# Patient Record
Sex: Female | Born: 1980 | Race: White | Hispanic: No | Marital: Married | Smoking: Former smoker
Health system: Southern US, Community
[De-identification: ages and names within clinical notes are randomized; demographics above are authoritative.]

## PROBLEM LIST (undated history)

## (undated) DIAGNOSIS — Z9289 Personal history of other medical treatment: Secondary | ICD-10-CM

## (undated) DIAGNOSIS — R87613 High grade squamous intraepithelial lesion on cytologic smear of cervix (HGSIL): Secondary | ICD-10-CM

## (undated) DIAGNOSIS — E119 Type 2 diabetes mellitus without complications: Secondary | ICD-10-CM

## (undated) DIAGNOSIS — Z789 Other specified health status: Secondary | ICD-10-CM

## (undated) DIAGNOSIS — F419 Anxiety disorder, unspecified: Secondary | ICD-10-CM

## (undated) DIAGNOSIS — I10 Essential (primary) hypertension: Secondary | ICD-10-CM

## (undated) DIAGNOSIS — Z720 Tobacco use: Secondary | ICD-10-CM

## (undated) DIAGNOSIS — I2510 Atherosclerotic heart disease of native coronary artery without angina pectoris: Secondary | ICD-10-CM

## (undated) DIAGNOSIS — Z6841 Body Mass Index (BMI) 40.0 and over, adult: Secondary | ICD-10-CM

## (undated) DIAGNOSIS — E7800 Pure hypercholesterolemia, unspecified: Secondary | ICD-10-CM

## (undated) HISTORY — DX: Pure hypercholesterolemia, unspecified: E78.00

## (undated) HISTORY — PX: ADENOIDECTOMY: SUR15

## (undated) HISTORY — DX: Personal history of other medical treatment: Z92.89

## (undated) HISTORY — PX: TYMPANOSTOMY TUBE PLACEMENT: SHX32

## (undated) HISTORY — PX: TONSILLECTOMY AND ADENOIDECTOMY: SUR1326

## (undated) HISTORY — DX: Body Mass Index (BMI) 40.0 and over, adult: Z684

## (undated) HISTORY — DX: Essential (primary) hypertension: I10

## (undated) HISTORY — DX: Tobacco use: Z72.0

## (undated) HISTORY — PX: OTHER SURGICAL HISTORY: SHX169

## (undated) HISTORY — DX: Atherosclerotic heart disease of native coronary artery without angina pectoris: I25.10

## (undated) HISTORY — DX: High grade squamous intraepithelial lesion on cytologic smear of cervix (HGSIL): R87.613

## (undated) HISTORY — DX: Type 2 diabetes mellitus without complications: E11.9

---
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
## 2002-07-26 ENCOUNTER — Inpatient Hospital Stay (HOSPITAL_COMMUNITY): Admission: AD | Admit: 2002-07-26 | Discharge: 2002-07-26 | Payer: Self-pay | Admitting: *Deleted

## 2002-09-16 ENCOUNTER — Encounter: Payer: Self-pay | Admitting: Obstetrics and Gynecology

## 2002-09-16 ENCOUNTER — Ambulatory Visit (HOSPITAL_COMMUNITY): Admission: RE | Admit: 2002-09-16 | Discharge: 2002-09-16 | Payer: Self-pay | Admitting: Obstetrics and Gynecology

## 2002-09-21 ENCOUNTER — Other Ambulatory Visit: Admission: RE | Admit: 2002-09-21 | Discharge: 2002-09-21 | Payer: Self-pay | Admitting: Obstetrics and Gynecology

## 2002-10-02 ENCOUNTER — Encounter: Payer: Self-pay | Admitting: Obstetrics and Gynecology

## 2002-10-02 ENCOUNTER — Ambulatory Visit (HOSPITAL_COMMUNITY): Admission: RE | Admit: 2002-10-02 | Discharge: 2002-10-02 | Payer: Self-pay | Admitting: Obstetrics and Gynecology

## 2002-12-27 ENCOUNTER — Inpatient Hospital Stay (HOSPITAL_COMMUNITY): Admission: AD | Admit: 2002-12-27 | Discharge: 2002-12-27 | Payer: Self-pay | Admitting: Obstetrics and Gynecology

## 2002-12-29 ENCOUNTER — Other Ambulatory Visit: Admission: RE | Admit: 2002-12-29 | Discharge: 2002-12-29 | Payer: Self-pay | Admitting: Obstetrics and Gynecology

## 2003-01-16 ENCOUNTER — Inpatient Hospital Stay (HOSPITAL_COMMUNITY): Admission: AD | Admit: 2003-01-16 | Discharge: 2003-01-20 | Payer: Self-pay | Admitting: Obstetrics and Gynecology

## 2003-01-17 ENCOUNTER — Encounter: Payer: Self-pay | Admitting: Obstetrics and Gynecology

## 2003-05-03 ENCOUNTER — Other Ambulatory Visit: Admission: RE | Admit: 2003-05-03 | Discharge: 2003-05-03 | Payer: Self-pay | Admitting: Obstetrics and Gynecology

## 2004-01-20 ENCOUNTER — Ambulatory Visit (HOSPITAL_COMMUNITY): Admission: RE | Admit: 2004-01-20 | Discharge: 2004-01-20 | Payer: Self-pay | Admitting: Obstetrics and Gynecology

## 2004-04-26 ENCOUNTER — Emergency Department (HOSPITAL_COMMUNITY): Admission: EM | Admit: 2004-04-26 | Discharge: 2004-04-26 | Payer: Self-pay | Admitting: Family Medicine

## 2004-07-20 ENCOUNTER — Inpatient Hospital Stay (HOSPITAL_COMMUNITY): Admission: AD | Admit: 2004-07-20 | Discharge: 2004-07-20 | Payer: Self-pay | Admitting: Obstetrics and Gynecology

## 2004-08-03 ENCOUNTER — Inpatient Hospital Stay (HOSPITAL_COMMUNITY): Admission: AD | Admit: 2004-08-03 | Discharge: 2004-08-05 | Payer: Self-pay | Admitting: Obstetrics and Gynecology

## 2004-08-31 ENCOUNTER — Other Ambulatory Visit: Admission: RE | Admit: 2004-08-31 | Discharge: 2004-08-31 | Payer: Self-pay | Admitting: Obstetrics and Gynecology

## 2005-05-05 ENCOUNTER — Emergency Department: Payer: Self-pay | Admitting: Unknown Physician Specialty

## 2008-10-31 ENCOUNTER — Emergency Department: Payer: Self-pay | Admitting: Emergency Medicine

## 2009-03-13 ENCOUNTER — Emergency Department: Payer: Self-pay | Admitting: Emergency Medicine

## 2009-08-18 DIAGNOSIS — R87613 High grade squamous intraepithelial lesion on cytologic smear of cervix (HGSIL): Secondary | ICD-10-CM

## 2009-08-18 HISTORY — DX: High grade squamous intraepithelial lesion on cytologic smear of cervix (HGSIL): R87.613

## 2009-09-04 ENCOUNTER — Emergency Department: Payer: Self-pay | Admitting: Emergency Medicine

## 2009-09-05 ENCOUNTER — Ambulatory Visit: Payer: Self-pay

## 2009-09-28 HISTORY — PX: COLPOSCOPY: SHX161

## 2010-10-22 NOTE — L&D Delivery Note (Signed)
Patient was C/C/+1 with OP presentation.  Manual rotation of head to OA succeeded  and pt pushed for 20 minutes more with epidural.    NSVD  female infant, Apgars 9,9, weight 6#13.   The patient had one 1 degree perineal lacerations repaired with 2-0 vicryl. Fundus was firm. EBL was expected. Placenta was delivered intact. Vagina was clear.  Baby was vigorous to bedside.   Mamta Rimmer A

## 2010-11-22 ENCOUNTER — Inpatient Hospital Stay (HOSPITAL_COMMUNITY): Payer: Medicaid Other

## 2010-11-22 ENCOUNTER — Inpatient Hospital Stay (HOSPITAL_COMMUNITY)
Admission: AD | Admit: 2010-11-22 | Discharge: 2010-11-23 | Disposition: A | Discharge status: Home / Self Care | Payer: Medicaid Other | Source: Ambulatory Visit | Attending: Obstetrics & Gynecology | Admitting: Obstetrics & Gynecology

## 2010-11-22 DIAGNOSIS — O2390 Unspecified genitourinary tract infection in pregnancy, unspecified trimester: Secondary | ICD-10-CM

## 2010-11-22 DIAGNOSIS — N760 Acute vaginitis: Secondary | ICD-10-CM

## 2010-11-22 DIAGNOSIS — R109 Unspecified abdominal pain: Secondary | ICD-10-CM | POA: Insufficient documentation

## 2010-11-22 DIAGNOSIS — A499 Bacterial infection, unspecified: Secondary | ICD-10-CM

## 2010-11-22 DIAGNOSIS — B9689 Other specified bacterial agents as the cause of diseases classified elsewhere: Secondary | ICD-10-CM | POA: Insufficient documentation

## 2010-11-22 LAB — PREGNANCY, URINE: Preg Test, Ur: POSITIVE

## 2010-11-22 LAB — URINALYSIS, ROUTINE W REFLEX MICROSCOPIC
Bilirubin Urine: NEGATIVE
Hgb urine dipstick: NEGATIVE
Ketones, ur: NEGATIVE mg/dL
Nitrite: NEGATIVE
Protein, ur: NEGATIVE mg/dL
Specific Gravity, Urine: 1.025 (ref 1.005–1.030)
Urine Glucose, Fasting: NEGATIVE mg/dL
Urobilinogen, UA: 0.2 mg/dL (ref 0.0–1.0)
pH: 6 (ref 5.0–8.0)

## 2010-11-23 LAB — WET PREP, GENITAL
Trich, Wet Prep: NONE SEEN
Yeast Wet Prep HPF POC: NONE SEEN

## 2010-11-23 LAB — GC/CHLAMYDIA PROBE AMP, GENITAL: Chlamydia, DNA Probe: NEGATIVE

## 2010-12-11 LAB — ABO/RH: RH Type: POSITIVE

## 2010-12-11 LAB — HEPATITIS B SURFACE ANTIGEN: Hepatitis B Surface Ag: NEGATIVE

## 2010-12-11 LAB — HIV ANTIBODY (ROUTINE TESTING W REFLEX): HIV: NONREACTIVE

## 2010-12-11 LAB — RUBELLA ANTIBODY, IGM: Rubella: IMMUNE

## 2010-12-11 LAB — ANTIBODY SCREEN: Antibody Screen: NEGATIVE

## 2011-01-08 ENCOUNTER — Other Ambulatory Visit (HOSPITAL_COMMUNITY): Payer: Self-pay | Admitting: Obstetrics & Gynecology

## 2011-01-08 DIAGNOSIS — Z3689 Encounter for other specified antenatal screening: Secondary | ICD-10-CM

## 2011-02-05 ENCOUNTER — Ambulatory Visit (HOSPITAL_COMMUNITY)
Admission: RE | Admit: 2011-02-05 | Discharge: 2011-02-05 | Disposition: A | Discharge status: Home / Self Care | Payer: Medicaid Other | Source: Ambulatory Visit | Attending: Obstetrics & Gynecology | Admitting: Obstetrics & Gynecology

## 2011-02-05 DIAGNOSIS — Z3689 Encounter for other specified antenatal screening: Secondary | ICD-10-CM

## 2011-04-18 ENCOUNTER — Inpatient Hospital Stay (HOSPITAL_COMMUNITY)
Admission: AD | Admit: 2011-04-18 | Discharge: 2011-04-18 | Disposition: A | Discharge status: Home / Self Care | Payer: Medicaid Other | Source: Ambulatory Visit | Attending: Obstetrics & Gynecology | Admitting: Obstetrics & Gynecology

## 2011-04-18 DIAGNOSIS — O4700 False labor before 37 completed weeks of gestation, unspecified trimester: Secondary | ICD-10-CM | POA: Insufficient documentation

## 2011-04-18 LAB — URINALYSIS, ROUTINE W REFLEX MICROSCOPIC
Bilirubin Urine: NEGATIVE
Hgb urine dipstick: NEGATIVE
Nitrite: NEGATIVE
Specific Gravity, Urine: 1.01 (ref 1.005–1.030)
pH: 6.5 (ref 5.0–8.0)

## 2011-04-18 LAB — URINE MICROSCOPIC-ADD ON

## 2011-04-22 DIAGNOSIS — Z9289 Personal history of other medical treatment: Secondary | ICD-10-CM

## 2011-04-22 HISTORY — DX: Personal history of other medical treatment: Z92.89

## 2011-05-22 ENCOUNTER — Encounter (HOSPITAL_COMMUNITY): Payer: Self-pay

## 2011-05-22 ENCOUNTER — Inpatient Hospital Stay (HOSPITAL_COMMUNITY)
Admission: AD | Admit: 2011-05-22 | Discharge: 2011-05-22 | Disposition: A | Discharge status: Home / Self Care | Payer: Medicaid Other | Source: Ambulatory Visit | Attending: Obstetrics and Gynecology | Admitting: Obstetrics and Gynecology

## 2011-05-22 DIAGNOSIS — O479 False labor, unspecified: Secondary | ICD-10-CM

## 2011-05-22 DIAGNOSIS — O4700 False labor before 37 completed weeks of gestation, unspecified trimester: Secondary | ICD-10-CM

## 2011-05-22 LAB — URINALYSIS, ROUTINE W REFLEX MICROSCOPIC
Bilirubin Urine: NEGATIVE
Glucose, UA: NEGATIVE mg/dL
Ketones, ur: NEGATIVE mg/dL
Protein, ur: NEGATIVE mg/dL
Urobilinogen, UA: 0.2 mg/dL (ref 0.0–1.0)

## 2011-05-22 LAB — URINE MICROSCOPIC-ADD ON

## 2011-05-22 MED ORDER — NIFEDIPINE 10 MG PO CAPS
10.0000 mg | ORAL_CAPSULE | ORAL | Status: DC | PRN
  Administered 2011-05-22: 10 mg via ORAL

## 2011-05-22 NOTE — ED Provider Notes (Signed)
Holly Garner is a 30 y.o. female at 34.[redacted] weeks gestation presenting for pelvic pressure and mild contractions avery 15-22 minutes since this afternoon. She also reports decreased FM today that has increased while in MAU and denies VB, LOF, vaginal discharge or UTI Sx. She has a Hx of a 34 week delivery.. She is not currently on tocolytics or 17P.  History OB History    Grav Para Term Preterm Abortions TAB SAB Ect Mult Living   3 2 1 1  0 0 0 0 0 2     No past medical history on file. Past Surgical History  Procedure Date  . Adnoidectomy   . Tonsillectomy and adenoidectomy    Family History: family history is not on file. Social History:  reports that she has been smoking.  She has never used smokeless tobacco. She reports that she does not drink alcohol or use illicit drugs.  ROS Otherwise neg   Blood pressure 141/87, pulse 101, temperature 98.9 F (37.2 C), temperature source Oral, resp. rate 16, height 5' 4.5" (1.638 m), weight 124.796 kg (275 lb 2 oz), last menstrual period 08/25/2010. Exam Physical Exam  Dilation: 1 Effacement (%): Thick Cervical Position: Posterior Exam by:: Holly Garner CNM  Prenatal labs: ABO, Rh:   Antibody:   Rubella:   RPR:    HBsAg:    HIV:    GBS:     Urinalysis with microscopic (Order [id number])      Component Value Range & Units Status    Color, Urine YELLOW YELLOW Final    Appearance CLEAR CLEAR Final    Specific Gravity, Urine 1.010 1.005 - 1.030 Final    pH 7.0 5.0 - 8.0 Final    Glucose, UA NEGATIVE NEGATIVE mg/dL Final    Hgb urine dipstick TRACE (A) NEGATIVE Final    Bilirubin Urine NEGATIVE NEGATIVE Final    Ketones, ur NEGATIVE NEGATIVE mg/dL Final    Protein, ur NEGATIVE NEGATIVE mg/dL Final    Urobilinogen, UA 0.2 0.0 - 1.0 mg/dL Final    Nitrite NEGATIVE NEGATIVE Final    Leukocytes, UA SMALL (A) NEGATIVE Final     Assessment/Plan: Assessment: 1. 34.2 week IUP 2. Preterm UC's w/ no cervical change 3. Hx PTD 4. FHR  category 1  Plan: 1. Push PO fluids 2. PO Procardia per consult w/ Dr. Henderson Garner. D/C home w/ PTL precautions if good response.  Holly Garner 05/22/2011, 5:27 PM

## 2011-05-22 NOTE — Progress Notes (Signed)
Pt states ctx's began at 1015 this am, has had decreased fm today, denies vaginal bleeding. Had vaginal d/c-light brown, non-odorous this weekend, none at present. Ctx's q15-65minutes apart.

## 2011-06-13 ENCOUNTER — Encounter (HOSPITAL_COMMUNITY): Payer: Self-pay | Admitting: *Deleted

## 2011-06-13 ENCOUNTER — Inpatient Hospital Stay (HOSPITAL_COMMUNITY)
Admission: AD | Admit: 2011-06-13 | Discharge: 2011-06-13 | Disposition: A | Discharge status: Home / Self Care | Payer: Medicaid Other | Source: Ambulatory Visit | Attending: Obstetrics and Gynecology | Admitting: Obstetrics and Gynecology

## 2011-06-13 ENCOUNTER — Inpatient Hospital Stay (HOSPITAL_COMMUNITY)
Admission: AD | Admit: 2011-06-13 | Discharge: 2011-06-15 | DRG: 775 | Disposition: A | Discharge status: Home / Self Care | Payer: Medicaid Other | Source: Ambulatory Visit | Attending: Obstetrics & Gynecology | Admitting: Obstetrics & Gynecology

## 2011-06-13 DIAGNOSIS — O4700 False labor before 37 completed weeks of gestation, unspecified trimester: Secondary | ICD-10-CM | POA: Insufficient documentation

## 2011-06-13 DIAGNOSIS — O479 False labor, unspecified: Secondary | ICD-10-CM

## 2011-06-13 DIAGNOSIS — O99892 Other specified diseases and conditions complicating childbirth: Secondary | ICD-10-CM | POA: Diagnosis present

## 2011-06-13 DIAGNOSIS — Z22330 Carrier of Group B streptococcus: Secondary | ICD-10-CM

## 2011-06-13 DIAGNOSIS — Z331 Pregnant state, incidental: Secondary | ICD-10-CM

## 2011-06-13 HISTORY — DX: Other specified health status: Z78.9

## 2011-06-13 LAB — URINE MICROSCOPIC-ADD ON

## 2011-06-13 LAB — CBC
HCT: 34.5 % — ABNORMAL LOW (ref 36.0–46.0)
HCT: 36.6 % (ref 36.0–46.0)
Hemoglobin: 11.6 g/dL — ABNORMAL LOW (ref 12.0–15.0)
Hemoglobin: 12 g/dL (ref 12.0–15.0)
MCH: 28.2 pg (ref 26.0–34.0)
MCH: 27.1 pg (ref 26.0–34.0)
MCV: 83.7 fL (ref 78.0–100.0)
RBC: 4.12 MIL/uL (ref 3.87–5.11)
RBC: 4.42 MIL/uL (ref 3.87–5.11)
WBC: 15 10*3/uL — ABNORMAL HIGH (ref 4.0–10.5)

## 2011-06-13 LAB — URINALYSIS, ROUTINE W REFLEX MICROSCOPIC
Bilirubin Urine: NEGATIVE
Ketones, ur: NEGATIVE mg/dL
Nitrite: NEGATIVE
Protein, ur: NEGATIVE mg/dL
pH: 7 (ref 5.0–8.0)

## 2011-06-13 LAB — URIC ACID: Uric Acid, Serum: 5.7 mg/dL (ref 2.4–7.0)

## 2011-06-13 LAB — COMPREHENSIVE METABOLIC PANEL
AST: 12 U/L (ref 0–37)
BUN: 7 mg/dL (ref 6–23)
CO2: 23 mEq/L (ref 19–32)
Calcium: 9.7 mg/dL (ref 8.4–10.5)
Chloride: 100 mEq/L (ref 96–112)
Creatinine, Ser: 0.6 mg/dL (ref 0.50–1.10)
GFR calc Af Amer: >60 mL/min (ref >60)
GFR calc non Af Amer: >60 mL/min (ref >60)
Glucose, Bld: 97 mg/dL (ref 70–99)
Total Bilirubin: 0.1 mg/dL — ABNORMAL LOW (ref 0.3–1.2)

## 2011-06-13 MED ORDER — IBUPROFEN 600 MG PO TABS: 600.0000 mg | ORAL_TABLET | Freq: Four times a day (QID) | ORAL | Status: DC | PRN

## 2011-06-13 MED ORDER — OXYTOCIN BOLUS FROM INFUSION
500.0000 mL | Freq: Once | INTRAVENOUS | Status: DC
  Filled 2011-06-13: qty 1000
  Filled 2011-06-13: qty 500

## 2011-06-13 MED ORDER — CEFAZOLIN SODIUM-DEXTROSE 2-3 GM-% IV SOLR
2.0000 g | Freq: Once | INTRAVENOUS | Status: DC
  Filled 2011-06-13: qty 50

## 2011-06-13 MED ORDER — FLEET ENEMA 7-19 GM/118ML RE ENEM: 1.0000 | ENEMA | RECTAL | Status: DC | PRN

## 2011-06-13 MED ORDER — ONDANSETRON HCL 4 MG/2ML IJ SOLN: 4.0000 mg | Freq: Four times a day (QID) | INTRAMUSCULAR | Status: DC | PRN

## 2011-06-13 MED ORDER — EPHEDRINE 5 MG/ML INJ
10.0000 mg | INTRAVENOUS | Status: DC | PRN
  Filled 2011-06-13: qty 4

## 2011-06-13 MED ORDER — PHENYLEPHRINE 40 MCG/ML (10ML) SYRINGE FOR IV PUSH (FOR BLOOD PRESSURE SUPPORT)
80.0000 ug | PREFILLED_SYRINGE | INTRAVENOUS | Status: DC | PRN
  Filled 2011-06-13: qty 5

## 2011-06-13 MED ORDER — OXYTOCIN 20 UNITS IN LACTATED RINGERS INFUSION - SIMPLE: 125.0000 mL/h | INTRAVENOUS | Status: DC

## 2011-06-13 MED ORDER — LACTATED RINGERS IV SOLN: 500.0000 mL | INTRAVENOUS | Status: DC | PRN

## 2011-06-13 MED ORDER — LACTATED RINGERS IV SOLN
500.0000 mL | Freq: Once | INTRAVENOUS | Status: AC
  Administered 2011-06-14: 1000 mL via INTRAVENOUS

## 2011-06-13 MED ORDER — ACETAMINOPHEN 325 MG PO TABS
650.0000 mg | ORAL_TABLET | ORAL | Status: DC | PRN
  Administered 2011-06-13 – 2011-06-14 (×3): 650 mg via ORAL
  Filled 2011-06-13 (×3): qty 2

## 2011-06-13 MED ORDER — PENICILLIN G POTASSIUM 5000000 UNITS IJ SOLR
2.5000 10*6.[IU] | INTRAVENOUS | Status: DC
  Administered 2011-06-14 (×3): 2.5 10*6.[IU] via INTRAVENOUS
  Filled 2011-06-13 (×8): qty 2.5

## 2011-06-13 MED ORDER — LIDOCAINE HCL (PF) 1 % IJ SOLN
30.0000 mL | INTRAMUSCULAR | Status: DC | PRN
  Filled 2011-06-13 (×2): qty 30

## 2011-06-13 MED ORDER — DIPHENHYDRAMINE HCL 50 MG/ML IJ SOLN: 12.5000 mg | INTRAMUSCULAR | Status: DC | PRN

## 2011-06-13 MED ORDER — BUTORPHANOL TARTRATE 2 MG/ML IJ SOLN
1.0000 mg | Freq: Once | INTRAMUSCULAR | Status: AC
  Administered 2011-06-13: 1 mg via INTRAMUSCULAR
  Filled 2011-06-13: qty 1

## 2011-06-13 MED ORDER — LACTATED RINGERS IV SOLN
INTRAVENOUS | Status: DC
  Administered 2011-06-13: 20:00:00 via INTRAVENOUS
  Administered 2011-06-14: 1000 mL via INTRAVENOUS

## 2011-06-13 MED ORDER — CITRIC ACID-SODIUM CITRATE 334-500 MG/5ML PO SOLN: 30.0000 mL | ORAL | Status: DC | PRN

## 2011-06-13 MED ORDER — OXYCODONE-ACETAMINOPHEN 5-325 MG PO TABS: 2.0000 | ORAL_TABLET | ORAL | Status: DC | PRN

## 2011-06-13 MED ORDER — EPHEDRINE 5 MG/ML INJ
10.0000 mg | INTRAVENOUS | Status: DC | PRN
  Filled 2011-06-13 (×2): qty 4

## 2011-06-13 MED ORDER — PHENYLEPHRINE 40 MCG/ML (10ML) SYRINGE FOR IV PUSH (FOR BLOOD PRESSURE SUPPORT)
80.0000 ug | PREFILLED_SYRINGE | INTRAVENOUS | Status: DC | PRN
  Filled 2011-06-13 (×2): qty 5

## 2011-06-13 MED ORDER — BUTORPHANOL TARTRATE 2 MG/ML IJ SOLN
1.0000 mg | INTRAMUSCULAR | Status: DC | PRN
  Administered 2011-06-14 (×3): 1 mg via INTRAVENOUS
  Filled 2011-06-13 (×3): qty 1

## 2011-06-13 MED ORDER — CEFAZOLIN SODIUM 1-5 GM-% IV SOLN: 1.0000 g | Freq: Three times a day (TID) | INTRAVENOUS | Status: DC

## 2011-06-13 MED ORDER — FENTANYL 2.5 MCG/ML BUPIVACAINE 1/10 % EPIDURAL INFUSION (WH - ANES)
14.0000 mL/h | INTRAMUSCULAR | Status: DC
  Filled 2011-06-13: qty 60

## 2011-06-13 MED ORDER — PENICILLIN G POTASSIUM 5000000 UNITS IJ SOLR
5.0000 10*6.[IU] | Freq: Once | INTRAVENOUS | Status: AC
  Administered 2011-06-13: 5 10*6.[IU] via INTRAVENOUS
  Filled 2011-06-13: qty 5

## 2011-06-13 NOTE — Progress Notes (Signed)
Pt states she was in MAU this am and was 2 cm. Was seen in the office and was 5 cm and unable to determine contractions on the monitor. Sent to MAU for evaluation.

## 2011-06-13 NOTE — Progress Notes (Signed)
Pt c/o ctx, labor evaluation

## 2011-06-13 NOTE — H&P (Signed)
  30 y.o. [id number]  Estimated Date of Delivery: 07/01/11 admitted at  37 1/2 WEEKS CONTRACTIONS DILATED 5 CM.  Patient was observed earlier today (2 am - 7 am) at 2 cm dilation and sent home.  Presented to the office today and found to be 5 cm.  Presents to MAU and after walking has had contractions of increasing frequency and intensity.  Cx now 5-6, 90% with bulging amnion and bloody show. Prenatal course complicated by: Obesity, smoking, previous preterm delivery at 34 weeks. Prenatal labs: Blood Type:O+.  Screening tests for HIV, Syphilis, Hepatitis B, Rubella sensitivity, and gestational diabetes  were all negative.  Patient is positive for perineal group B strep colonization.  She declined screening for fetal anomalies.  Afebrile, VSS Heart and Lungs: No active disease Abdomen: soft, gravid, EFW 7 lbs. Cervical exam:  5-6, Vtx. -2.     Impression: 37 1/2 WEEKS CONTRACTIONS DILATED 5 CM  Plan:  Admit to [facility]

## 2011-06-13 NOTE — Progress Notes (Signed)
Pt reports some bleeding tonight, uc's q 4 minutes. Denies problems with preg

## 2011-06-13 NOTE — ED Provider Notes (Signed)
History   Pt presents today c/o ctx and abd pain. She reports GFM and denies vag dc or bleeding.  No chief complaint on file.  HPI  OB History    Grav Para Term Preterm Abortions TAB SAB Ect Mult Living   3 2 1 1  0 0 0 0 0 2      No past medical history on file.  Past Surgical History  Procedure Date  . Adnoidectomy   . Tonsillectomy and adenoidectomy     No family history on file.  History  Substance Use Topics  . Smoking status: Current Everyday Smoker -- 1.0 packs/day  . Smokeless tobacco: Never Used  . Alcohol Use: No    Allergies: No Known Allergies  Prescriptions prior to admission  Medication Sig Dispense Refill  . acetaminophen (TYLENOL) 325 MG tablet Take by mouth every 6 (six) hours as needed. As needed for pain       . calcium carbonate (TUMS - DOSED IN MG ELEMENTAL CALCIUM) 500 MG chewable tablet Chew 1 tablet by mouth daily. As needed for heartburn       . prenatal vitamin w/FE, FA (PRENATAL 1 + 1) 27-1 MG TABS Take 1 tablet by mouth daily.          Review of Systems  Constitutional: Negative for fever.  Cardiovascular: Negative for chest pain.  Gastrointestinal: Positive for abdominal pain. Negative for nausea, vomiting and diarrhea.  Genitourinary: Negative for dysuria, urgency, frequency and hematuria.  Neurological: Negative for dizziness and headaches.  Psychiatric/Behavioral: Negative for depression and suicidal ideas.   Physical Exam   Blood pressure 140/76, pulse 97, resp. rate 20, last menstrual period 08/25/2010.  Physical Exam  Constitutional: She is oriented to person, place, and time. She appears well-developed and well-nourished. No distress.  HENT:  Head: Normocephalic and atraumatic.  Eyes: EOM are normal. Pupils are equal, round, and reactive to light.  GI: Soft. She exhibits no distension. There is no tenderness. There is no rebound and no guarding.  Genitourinary:       Cervix per nurse 1/thick/-2 and unchanged after an hour.    Neurological: She is alert and oriented to person, place, and time.  Skin: Skin is warm and dry. She is not diaphoretic.  Psychiatric: She has a normal mood and affect. Her behavior is normal. Judgment and thought content normal.    MAU Course  Procedures  Results for orders placed during the hospital encounter of 06/13/11 (from the past 24 hour(s))  URINALYSIS, ROUTINE W REFLEX MICROSCOPIC     Status: Abnormal   Collection Time   06/13/11  4:00 AM      Component Value Range   Color, Urine YELLOW  YELLOW    Appearance CLEAR  CLEAR    Specific Gravity, Urine 1.015  1.005 - 1.030    pH 7.0  5.0 - 8.0    Glucose, UA NEGATIVE  NEGATIVE (mg/dL)   Hgb urine dipstick SMALL (*) NEGATIVE    Bilirubin Urine NEGATIVE  NEGATIVE    Ketones, ur NEGATIVE  NEGATIVE (mg/dL)   Protein, ur NEGATIVE  NEGATIVE (mg/dL)   Urobilinogen, UA 0.2  0.0 - 1.0 (mg/dL)   Nitrite NEGATIVE  NEGATIVE    Leukocytes, UA SMALL (*) NEGATIVE   URINE MICROSCOPIC-ADD ON     Status: Abnormal   Collection Time   06/13/11  4:00 AM      Component Value Range   Squamous Epithelial / LPF FEW (*) RARE    WBC,  UA 3-6  <3 (WBC/hpf)   RBC / HPF 0-2  <3 (RBC/hpf)   Bacteria, UA FEW (*) RARE   CBC     Status: Abnormal   Collection Time   06/13/11  4:05 AM      Component Value Range   WBC 15.0 (*) 4.0 - 10.5 (K/uL)   RBC 4.12  3.87 - 5.11 (MIL/uL)   Hemoglobin 11.6 (*) 12.0 - 15.0 (g/dL)   HCT 40.9 (*) 81.1 - 46.0 (%)   MCV 83.7  78.0 - 100.0 (fL)   MCH 28.2  26.0 - 34.0 (pg)   MCHC 33.6  30.0 - 36.0 (g/dL)   RDW 91.4  78.2 - 95.6 (%)   Platelets 289  150 - 400 (K/uL)  COMPREHENSIVE METABOLIC PANEL     Status: Abnormal   Collection Time   06/13/11  4:05 AM      Component Value Range   Sodium 135  135 - 145 (mEq/L)   Potassium 3.9  3.5 - 5.1 (mEq/L)   Chloride 100  96 - 112 (mEq/L)   CO2 23  19 - 32 (mEq/L)   Glucose, Bld 97  70 - 99 (mg/dL)   BUN 7  6 - 23 (mg/dL)   Creatinine, Ser 2.13  0.50 - 1.10 (mg/dL)    Calcium 9.7  8.4 - 10.5 (mg/dL)   Total Protein 6.6  6.0 - 8.3 (g/dL)   Albumin 2.6 (*) 3.5 - 5.2 (g/dL)   AST 12  0 - 37 (U/L)   ALT 10  0 - 35 (U/L)   Alkaline Phosphatase 232 (*) 39 - 117 (U/L)   Total Bilirubin 0.1 (*) 0.3 - 1.2 (mg/dL)   GFR calc non Af Amer >60  >60 (mL/min)   GFR calc Af Amer >60  >60 (mL/min)  LACTATE DEHYDROGENASE     Status: Normal   Collection Time   06/13/11  4:05 AM      Component Value Range   LD 167  94 - 250 (U/L)  URIC ACID     Status: Normal   Collection Time   06/13/11  4:05 AM      Component Value Range   Uric Acid, Serum 5.7  2.4 - 7.0 (mg/dL)   Discussed pt with Dr. Tenny Craw. Pt to keep her scheduled appt later today.   Assessment and Plan  Braxton Hicks ctx: discussed with pt at length. Discussed signs and sx of preeclampsia. Reminded of [facility]. DC to home with precautions.  Clinton Gallant. Raychelle Hudman III, DrHSc, MPAS, PA-C  06/13/2011, 6:30 AM

## 2011-06-13 NOTE — Progress Notes (Signed)
Pt in for labor eval- reports ucs in back about every 10 minutes.  Denies any leaking of fluid, reports bleeding last night, none presently.  Decreased fetal movement past couple days.

## 2011-06-14 ENCOUNTER — Encounter (HOSPITAL_COMMUNITY): Payer: Self-pay | Admitting: Anesthesiology

## 2011-06-14 ENCOUNTER — Encounter (HOSPITAL_COMMUNITY): Payer: Self-pay | Admitting: *Deleted

## 2011-06-14 ENCOUNTER — Inpatient Hospital Stay (HOSPITAL_COMMUNITY): Payer: Medicaid Other | Admitting: Anesthesiology

## 2011-06-14 LAB — RPR: RPR Ser Ql: NONREACTIVE

## 2011-06-14 MED ORDER — IBUPROFEN 800 MG PO TABS
800.0000 mg | ORAL_TABLET | Freq: Three times a day (TID) | ORAL | Status: DC
  Administered 2011-06-15 (×2): 800 mg via ORAL
  Filled 2011-06-14 (×2): qty 1

## 2011-06-14 MED ORDER — DIPHENHYDRAMINE HCL 25 MG PO CAPS: 25.0000 mg | ORAL_CAPSULE | Freq: Four times a day (QID) | ORAL | Status: DC | PRN

## 2011-06-14 MED ORDER — BENZOCAINE-MENTHOL 20-0.5 % EX AERO
INHALATION_SPRAY | CUTANEOUS | Status: AC
  Filled 2011-06-14: qty 56

## 2011-06-14 MED ORDER — DIBUCAINE 1 % RE OINT: 1.0000 "application " | TOPICAL_OINTMENT | RECTAL | Status: DC | PRN

## 2011-06-14 MED ORDER — ONDANSETRON HCL 4 MG/2ML IJ SOLN: 4.0000 mg | INTRAMUSCULAR | Status: DC | PRN

## 2011-06-14 MED ORDER — TERBUTALINE SULFATE 1 MG/ML IJ SOLN: 0.2500 mg | Freq: Once | INTRAMUSCULAR | Status: DC | PRN

## 2011-06-14 MED ORDER — BUPIVACAINE HCL (PF) 0.25 % IJ SOLN
INTRAMUSCULAR | Status: DC | PRN
  Administered 2011-06-14: 10 mL

## 2011-06-14 MED ORDER — PRENATAL PLUS 27-1 MG PO TABS
1.0000 | ORAL_TABLET | Freq: Every day | ORAL | Status: DC
  Administered 2011-06-14 – 2011-06-15 (×2): 1 via ORAL
  Filled 2011-06-14: qty 1

## 2011-06-14 MED ORDER — FERROUS SULFATE 325 (65 FE) MG PO TABS
325.0000 mg | ORAL_TABLET | Freq: Two times a day (BID) | ORAL | Status: DC
  Administered 2011-06-14 – 2011-06-15 (×2): 325 mg via ORAL
  Filled 2011-06-14 (×2): qty 1

## 2011-06-14 MED ORDER — IBUPROFEN 800 MG PO TABS
800.0000 mg | ORAL_TABLET | Freq: Three times a day (TID) | ORAL | Status: DC
  Administered 2011-06-14: 800 mg via ORAL
  Filled 2011-06-14 (×2): qty 1

## 2011-06-14 MED ORDER — WITCH HAZEL-GLYCERIN EX PADS: 1.0000 "application " | MEDICATED_PAD | CUTANEOUS | Status: DC | PRN

## 2011-06-14 MED ORDER — BENZOCAINE-MENTHOL 20-0.5 % EX AERO: 1.0000 "application " | INHALATION_SPRAY | CUTANEOUS | Status: DC | PRN

## 2011-06-14 MED ORDER — OXYTOCIN 20 UNITS IN LACTATED RINGERS INFUSION - SIMPLE
1.0000 m[IU]/min | INTRAVENOUS | Status: DC
  Administered 2011-06-14: 333 m[IU]/min via INTRAVENOUS
  Administered 2011-06-14: 2 m[IU]/min via INTRAVENOUS

## 2011-06-14 MED ORDER — SENNOSIDES-DOCUSATE SODIUM 8.6-50 MG PO TABS
2.0000 | ORAL_TABLET | Freq: Every day | ORAL | Status: DC
  Administered 2011-06-14: 2 via ORAL

## 2011-06-14 MED ORDER — LIDOCAINE HCL 1.5 % IJ SOLN
INTRAMUSCULAR | Status: DC | PRN
  Administered 2011-06-14 (×2): 5 mL via EPIDURAL

## 2011-06-14 MED ORDER — TETANUS-DIPHTH-ACELL PERTUSSIS 5-2.5-18.5 LF-MCG/0.5 IM SUSP: 0.5000 mL | Freq: Once | INTRAMUSCULAR | Status: DC

## 2011-06-14 MED ORDER — SIMETHICONE 80 MG PO CHEW: 80.0000 mg | CHEWABLE_TABLET | ORAL | Status: DC | PRN

## 2011-06-14 MED ORDER — LANOLIN HYDROUS EX OINT: TOPICAL_OINTMENT | CUTANEOUS | Status: DC | PRN

## 2011-06-14 MED ORDER — MEASLES, MUMPS & RUBELLA VAC ~~LOC~~ INJ: 0.5000 mL | INJECTION | Freq: Once | SUBCUTANEOUS | Status: DC

## 2011-06-14 MED ORDER — ZOLPIDEM TARTRATE 5 MG PO TABS: 5.0000 mg | ORAL_TABLET | Freq: Every evening | ORAL | Status: DC | PRN

## 2011-06-14 MED ORDER — ONDANSETRON HCL 4 MG PO TABS: 4.0000 mg | ORAL_TABLET | ORAL | Status: DC | PRN

## 2011-06-14 MED ORDER — OXYCODONE-ACETAMINOPHEN 5-325 MG PO TABS
1.0000 | ORAL_TABLET | ORAL | Status: DC | PRN
  Administered 2011-06-14: 2 via ORAL
  Administered 2011-06-15 (×3): 1 via ORAL
  Filled 2011-06-14 (×5): qty 1

## 2011-06-14 MED ORDER — MAGNESIUM HYDROXIDE 400 MG/5ML PO SUSP: 30.0000 mL | ORAL | Status: DC | PRN

## 2011-06-14 NOTE — Progress Notes (Addendum)
Last BP 130/82.  Some elevated BPs but less than 150/95. PIH labs normal.  FHTs 140s, NST R. Toco q10 SVE 6/C/-2, AROM clear.  Ok for epidural when wants and augment if no change in 1 hour.  Jessiah Wojnar A

## 2011-06-14 NOTE — Progress Notes (Signed)
Patient was C/C/+1 with OP presentation.  Manual rotation of head to OA succeeded  and pt pushed for 20 minutes more with epidural.    NSVD  female infant, Apgars 9,9, weight 6#13.   The patient had one 1 degree perineal lacerations repaired with 2-0 vicryl. Fundus was firm. EBL was expected. Placenta was delivered intact. Vagina was clear.  Baby was vigorous to bedside.   Maui Britten A  

## 2011-06-14 NOTE — Anesthesia Procedure Notes (Signed)
Epidural Patient location during procedure: OB Start time: 06/14/2011 10:38 AM  Staffing Performed by: anesthesiologist   Preanesthetic Checklist Completed: patient identified, site marked, surgical consent, pre-op evaluation, timeout performed, IV checked, risks and benefits discussed and monitors and equipment checked  Epidural Patient position: sitting Prep: site prepped and draped and DuraPrep Patient monitoring: continuous pulse ox and blood pressure Approach: midline Injection technique: LOR air and LOR saline  Needle:  Needle type: Tuohy  Needle gauge: 17 G Needle length: 9 cm Needle insertion depth: 8 cm Catheter type: closed end flexible Catheter size: 19 Gauge Catheter at skin depth: 13 cm Test dose: negative  Assessment Events: blood not aspirated, injection not painful, no injection resistance, negative IV test and no paresthesia  Additional Notes Patient identified.  Risk benefits discussed including failed block, incomplete pain control, headache, nerve damage, paralysis, blood pressure changes, nausea, vomiting, reactions to medication both toxic or allergic, and postpartum back pain.  Patient expressed understanding and wished to proceed.  All questions were answered.  Sterile technique used throughout procedure and epidural site dressed with sterile barrier dressing. No paresthesia or other complications noted.The patient did not experience any signs of intravascular injection such as tinnitus or metallic taste in mouth nor signs of intrathecal spread such as rapid motor block. Please see nursing notes for vital signs.

## 2011-06-14 NOTE — Anesthesia Preprocedure Evaluation (Signed)
Anesthesia Evaluation  Name, MR# and DOB Patient awake  General Assessment Comment  Reviewed: Allergy & Precautions, H&P , Patient's Chart, lab work & pertinent test results  Airway Mallampati: IV TM Distance: >3 FB Neck ROM: full    Dental No notable dental hx.    Pulmonary  clear to auscultation  pulmonary exam normalPulmonary Exam Normal breath sounds clear to auscultation none    Cardiovascular regular Normal    Neuro/Psych Negative Neurological ROS  Negative Psych ROS  GI/Hepatic/Renal negative GI ROS  negative Liver ROS  negative Renal ROS        Endo/Other  Negative Endocrine ROS (+)   Morbid obesity  Abdominal   Musculoskeletal   Hematology negative hematology ROS (+)   Peds  Reproductive/Obstetrics (+) Pregnancy    Anesthesia Other Findings             Anesthesia Physical Anesthesia Plan  ASA: III  Anesthesia Plan: Epidural   Post-op Pain Management:    Induction:   Airway Management Planned:   Additional Equipment:   Intra-op Plan:   Post-operative Plan:   Informed Consent: I have reviewed the patients History and Physical, chart, labs and discussed the procedure including the risks, benefits and alternatives for the proposed anesthesia with the patient or authorized representative who has indicated his/her understanding and acceptance.     Plan Discussed with:   Anesthesia Plan Comments:         Anesthesia Quick Evaluation

## 2011-06-15 LAB — CBC
Hemoglobin: 10 g/dL — ABNORMAL LOW (ref 12.0–15.0)
MCH: 27 pg (ref 26.0–34.0)
MCV: 84.3 fL (ref 78.0–100.0)
Platelets: 271 10*3/uL (ref 150–400)
RBC: 3.7 MIL/uL — ABNORMAL LOW (ref 3.87–5.11)
WBC: 14.1 10*3/uL — ABNORMAL HIGH (ref 4.0–10.5)

## 2011-06-15 NOTE — Anesthesia Postprocedure Evaluation (Signed)
Anesthesia Post Note  Patient: Holly Garner  Procedure(s) Performed: * No procedures listed *  Anesthesia type: Epidural  Patient location: Mother/Baby  Post pain: Pain level controlled  Post assessment: Post-op Vital signs reviewed  Last Vitals:  Filed Vitals:   06/15/11 1411  BP: 119/77  Pulse: 87  Temp: 97.7 F (36.5 C)  Resp: 18    Post vital signs: Reviewed  Level of consciousness: awake  Complications: No apparent anesthesia complications

## 2011-06-15 NOTE — Discharge Summary (Signed)
Holly Garner is doing well today and is desirous of discharge. Hopefully, later today the baby will be able to be discharged facilitating Holly Garner's discharge.  Discharge diagnoses  #1 37-38 week intrauterine pregnancy delivered 6 lbs. 13 oz. Female infant Apgars 9 and 9  #2 blood type O positive  #3 OP presentation  #4 positive intrapartum group B strep  Procedures  Normal spontaneous delivery and repair of first degree tear  As patient, a gravida 3 now para 3 was admitted on the evening of 8/22 in labor. She had a protracted first stage of labor probably related to being occiput posterior. She did have a normal spontaneous delivery on /23-delivered by Dr. Henderson Cloud. She is bottlefeeding without difficulty and go she is a baby are doing well. Her hemoglobin on the morning of 824 was 10.0 with a white count of 14,100 and a platelet count of 271,000. She did receive intrapartum penicillin because of her positive group B strep status. On the morning of 824 and she was desirous of discharge assuming that the baby could be discharged. Accordingly she was given all appropriate instructions per office discharge brochure and understood all instructions well. Discharge medications include vitamins one a day which she will finish, and she will take iron pills 3-4 times a week. She was given prescriptions for Percocet 5/325 to use 1-2 every 4-6 hours as needed for severe cramping and Motrin 600 mg to use every 6 hours as needed for mild cramping or pain. Follow up in the office will be carried out in approximately 4 weeks' time or as needed. Condition on discharge improved.

## 2011-06-15 NOTE — Progress Notes (Signed)
UR Chart review completed.  

## 2011-07-07 ENCOUNTER — Emergency Department: Payer: Self-pay | Admitting: Emergency Medicine

## 2012-07-03 ENCOUNTER — Emergency Department: Payer: Self-pay | Admitting: Emergency Medicine

## 2012-07-07 LAB — WOUND CULTURE

## 2012-09-25 ENCOUNTER — Ambulatory Visit: Payer: Self-pay | Admitting: Obstetrics & Gynecology

## 2012-10-06 ENCOUNTER — Ambulatory Visit: Payer: Self-pay | Admitting: Obstetrics & Gynecology

## 2012-10-06 LAB — CBC
HGB: 13.2 g/dL (ref 12.0–16.0)
MCHC: 31.4 g/dL — ABNORMAL LOW (ref 32.0–36.0)
RDW: 15.1 % — ABNORMAL HIGH (ref 11.5–14.5)

## 2012-10-06 LAB — PREGNANCY, URINE: Pregnancy Test, Urine: NEGATIVE m[IU]/mL

## 2012-10-07 ENCOUNTER — Ambulatory Visit: Payer: Self-pay

## 2012-12-10 ENCOUNTER — Emergency Department: Payer: Self-pay | Admitting: Emergency Medicine

## 2014-03-26 ENCOUNTER — Ambulatory Visit: Payer: Self-pay | Admitting: Unknown Physician Specialty

## 2014-08-23 ENCOUNTER — Encounter (HOSPITAL_COMMUNITY): Payer: Self-pay | Admitting: *Deleted

## 2014-09-13 ENCOUNTER — Emergency Department: Payer: Self-pay | Admitting: Emergency Medicine

## 2015-02-14 LAB — SURGICAL PATHOLOGY

## 2015-02-21 ENCOUNTER — Emergency Department
Admission: EM | Admit: 2015-02-21 | Discharge: 2015-02-21 | Disposition: A | Discharge status: Home / Self Care | Payer: Medicaid Other | Attending: Emergency Medicine | Admitting: Emergency Medicine

## 2015-02-21 DIAGNOSIS — L03114 Cellulitis of left upper limb: Secondary | ICD-10-CM | POA: Diagnosis not present

## 2015-02-21 DIAGNOSIS — R21 Rash and other nonspecific skin eruption: Secondary | ICD-10-CM | POA: Diagnosis present

## 2015-02-21 DIAGNOSIS — Y9289 Other specified places as the place of occurrence of the external cause: Secondary | ICD-10-CM | POA: Diagnosis not present

## 2015-02-21 DIAGNOSIS — Z79899 Other long term (current) drug therapy: Secondary | ICD-10-CM | POA: Diagnosis not present

## 2015-02-21 DIAGNOSIS — T7840XA Allergy, unspecified, initial encounter: Secondary | ICD-10-CM | POA: Insufficient documentation

## 2015-02-21 DIAGNOSIS — X58XXXA Exposure to other specified factors, initial encounter: Secondary | ICD-10-CM | POA: Insufficient documentation

## 2015-02-21 DIAGNOSIS — Z720 Tobacco use: Secondary | ICD-10-CM | POA: Diagnosis not present

## 2015-02-21 DIAGNOSIS — Y998 Other external cause status: Secondary | ICD-10-CM | POA: Insufficient documentation

## 2015-02-21 DIAGNOSIS — Y9389 Activity, other specified: Secondary | ICD-10-CM | POA: Insufficient documentation

## 2015-02-21 MED ORDER — RANITIDINE HCL 150 MG/10ML PO SYRP
150.0000 mg | ORAL_SOLUTION | Freq: Once | ORAL | Status: DC
  Filled 2015-02-21: qty 10

## 2015-02-21 MED ORDER — KETOROLAC TROMETHAMINE 10 MG PO TABS
ORAL_TABLET | ORAL | Status: DC
Start: 2015-02-21 — End: 2015-02-22
  Filled 2015-02-21: qty 1

## 2015-02-21 MED ORDER — PREDNISONE 10 MG (21) PO TBPK: ORAL_TABLET | ORAL | Status: DC

## 2015-02-21 MED ORDER — CEPHALEXIN 500 MG PO CAPS: 500.0000 mg | ORAL_CAPSULE | Freq: Four times a day (QID) | ORAL | Status: AC

## 2015-02-21 MED ORDER — RANITIDINE HCL 150 MG PO TABS
150.0000 mg | ORAL_TABLET | Freq: Every day | ORAL | Status: DC
Start: 2015-02-21 — End: 2018-01-28

## 2015-02-21 MED ORDER — DEXAMETHASONE SODIUM PHOSPHATE 10 MG/ML IJ SOLN
INTRAMUSCULAR | Status: AC
  Filled 2015-02-21: qty 1

## 2015-02-21 MED ORDER — CEPHALEXIN 500 MG PO CAPS
ORAL_CAPSULE | ORAL | Status: AC
  Filled 2015-02-21: qty 2

## 2015-02-21 MED ORDER — RANITIDINE HCL 150 MG PO TABS
ORAL_TABLET | ORAL | Status: AC
  Administered 2015-02-21: 150 mg
  Filled 2015-02-21: qty 1

## 2015-02-21 MED ORDER — KETOROLAC TROMETHAMINE 10 MG PO TABS
10.0000 mg | ORAL_TABLET | Freq: Once | ORAL | Status: AC
  Administered 2015-02-21: 10 mg via ORAL

## 2015-02-21 MED ORDER — DEXAMETHASONE SODIUM PHOSPHATE 10 MG/ML IJ SOLN
10.0000 mg | Freq: Once | INTRAMUSCULAR | Status: AC
  Administered 2015-02-21: 10 mg via INTRAMUSCULAR

## 2015-02-21 MED ORDER — CEPHALEXIN 500 MG PO CAPS
1000.0000 mg | ORAL_CAPSULE | Freq: Once | ORAL | Status: AC
  Administered 2015-02-21: 1000 mg via ORAL

## 2015-02-21 MED ORDER — HYDROXYZINE HCL 25 MG PO TABS: 25.0000 mg | ORAL_TABLET | Freq: Three times a day (TID) | ORAL | Status: DC | PRN

## 2015-02-21 NOTE — ED Notes (Signed)
PA gave VORB to give zantac PO instead on suspension.

## 2015-02-21 NOTE — ED Notes (Signed)
Pt working in garden without gloves and came in contact with thorns.  Pt c/o left index finger pain and swelling and right thumb pain and swelling.  Pt pulses strong and equal bilaterally, no decrease in sensation to fingers.  Pt A&Ox4, speaking in complete and coherent sentences and in NAD at this time.

## 2015-02-21 NOTE — ED Notes (Signed)
Pt states she was pulling weeds and things earlier today. Pt states around 1300 today she noticed her left hand and fingers starting to swell and become red. Pt states she took 50mg  of benadryl around 1900. Pt is unsure if she was bitten or some sort of poison

## 2015-02-21 NOTE — Discharge Instructions (Signed)

## 2015-02-21 NOTE — ED Provider Notes (Signed)
[facility] Emergency Department Provider Note   ____________________________________________  Time seen: 2245  I have reviewed the triage vital signs and the nursing notes.   HISTORY  Chief Complaint Rash and Hand Problem       HPI Holly Garner is a 34 y.o. female caught her hand on the phone today pulling weeds today now swelling of her left hand occurred approximately 6 hours ago progressively more swelling and worsening over the last hour prior to arrival is about 7 out of 10 swelling type pain movement making it worse home and it still makes it better no other associated signs or symptoms currently     Past Medical History  Diagnosis Date  . No pertinent past medical history     There are no active problems to display for this patient.   Past Surgical History  Procedure Laterality Date  . Adnoidectomy    . Tonsillectomy and adenoidectomy      Current Outpatient Rx  Name  Route  Sig  Dispense  Refill  . cephALEXin (KEFLEX) 500 MG capsule   Oral   Take 1 capsule (500 mg total) by mouth 4 (four) times daily.   40 capsule   0   . predniSONE (STERAPRED UNI-PAK 21 TAB) 10 MG (21) TBPK tablet      Take 6 tablets on day 1 Take 5 tablets on day 2 Take 4 tablets on day 3 Take 3 tablets on day 4 Take 2 tablets on day 5 Take 1 tablet on day 6   21 tablet   0   . prenatal vitamin w/FE, FA (PRENATAL 1 + 1) 27-1 MG TABS   Oral   Take 1 tablet by mouth daily.           . ranitidine (ZANTAC) 150 MG tablet   Oral   Take 1 tablet (150 mg total) by mouth at bedtime.   30 tablet   1     Allergies Review of patient's allergies indicates no known allergies.  No family history on file.  Social History History  Substance Use Topics  . Smoking status: Current Every Day Smoker -- 1.00 packs/day for 10 years    Types: Cigarettes  . Smokeless tobacco: Never Used  . Alcohol Use: No    Review of Systems  Constitutional:  Negative for fever. Eyes: Negative for visual changes. ENT: Negative for sore throat. Cardiovascular: Negative for chest pain. Respiratory: Negative for shortness of breath. Gastrointestinal: Negative for abdominal pain, vomiting and diarrhea. Genitourinary: Negative for dysuria. Musculoskeletal: Negative for back pain. Skin: Negative for rash. Neurological: Negative for headaches, focal weakness or numbness.   10-point ROS otherwise negative.  ____________________________________________   PHYSICAL EXAM:  VITAL SIGNS: ED Triage Vitals  Enc Vitals Group     BP 02/21/15 2042 157/100 mmHg     Pulse Rate 02/21/15 2042 99     Resp 02/21/15 2042 16     Temp 02/21/15 2042 98.8 F (37.1 C)     Temp Source 02/21/15 2042 Oral     SpO2 02/21/15 2042 99 %     Weight 02/21/15 2042 292 lb (132.45 kg)     Height 02/21/15 2042 5\' 5"  (1.651 m)     Head Cir --      Peak Flow --      Pain Score 02/21/15 2043 10     Pain Loc --      Pain Edu? --      Excl. in  GC? --      Constitutional: Alert and oriented. Well appearing and in no distress. Eyes: Conjunctivae are normal. PERRL. Normal extraocular movements. ENT   Head: Normocephalic and atraumatic.   Nose: No congestion/rhinnorhea.   Mouth/Throat: Mucous membranes are moist.   Neck: No stridor. Hematological/Lymphatic/Immunilogical: No cervical lymphadenopathy. Cardiovascular: Normal rate, regular rhythm. Normal and symmetric distal pulses are present in all extremities. No murmurs, rubs, or gallops. Respiratory: Normal respiratory effort without tachypnea nor retractions. Breath sounds are clear and equal bilaterally. No wheezes/rales/rhonchi. Musculoskeletal: Nontender with normal range of motion in all extremities. No joint effusions.  No lower extremity tenderness nor edema. Neurologic:  Normal speech and language. No gross focal neurologic deficits are appreciated. Speech is normal. No gait instability. Skin:   Swelling to the left hand and redness around the second digit Psychiatric: Mood and affect are normal. Speech and behavior are normal. Patient exhibits appropriate insight and judgment.  ____________________________________________       PROCEDURES  Procedure(s) performed: None  Critical Care performed: No  ____________________________________________   INITIAL IMPRESSION / ASSESSMENT AND PLAN / ED COURSE  Pertinent labs & imaging results that were available during my care of the patient were reviewed by me and considered in my medical decision making (see chart for details).  Initial impression allergic reaction versus cellulitis patient was given steroids and antihistamines and antibiotics in the department we'll place her on them for the next few days or return here in 2 days if not improving  ____________________________________________   FINAL CLINICAL IMPRESSION(S) / ED DIAGNOSES  Final diagnoses:  Cellulitis of left upper extremity  Allergic, initial encounter    Kayli Beal Rosalyn Gess, PA-C 02/21/15 2313  Phineas Semen, MD 02/22/15 [phone]

## 2015-04-05 ENCOUNTER — Encounter: Payer: Self-pay | Admitting: Emergency Medicine

## 2015-04-05 ENCOUNTER — Emergency Department
Admission: EM | Admit: 2015-04-05 | Discharge: 2015-04-05 | Disposition: A | Discharge status: Home / Self Care | Payer: Medicaid Other | Attending: Emergency Medicine | Admitting: Emergency Medicine

## 2015-04-05 DIAGNOSIS — Y9289 Other specified places as the place of occurrence of the external cause: Secondary | ICD-10-CM | POA: Insufficient documentation

## 2015-04-05 DIAGNOSIS — Y998 Other external cause status: Secondary | ICD-10-CM | POA: Diagnosis not present

## 2015-04-05 DIAGNOSIS — X58XXXA Exposure to other specified factors, initial encounter: Secondary | ICD-10-CM | POA: Diagnosis not present

## 2015-04-05 DIAGNOSIS — T7840XA Allergy, unspecified, initial encounter: Secondary | ICD-10-CM

## 2015-04-05 DIAGNOSIS — Y9389 Activity, other specified: Secondary | ICD-10-CM | POA: Insufficient documentation

## 2015-04-05 DIAGNOSIS — R220 Localized swelling, mass and lump, head: Secondary | ICD-10-CM | POA: Diagnosis present

## 2015-04-05 DIAGNOSIS — Z720 Tobacco use: Secondary | ICD-10-CM | POA: Insufficient documentation

## 2015-04-05 MED ORDER — RANITIDINE HCL 150 MG PO TABS: 150.0000 mg | ORAL_TABLET | Freq: Every day | ORAL | Status: DC

## 2015-04-05 MED ORDER — LORATADINE 10 MG PO TABS: 10.0000 mg | ORAL_TABLET | Freq: Every day | ORAL | Status: DC

## 2015-04-05 MED ORDER — DEXAMETHASONE SODIUM PHOSPHATE 10 MG/ML IJ SOLN
10.0000 mg | Freq: Once | INTRAMUSCULAR | Status: AC
  Administered 2015-04-05: 10 mg via INTRAMUSCULAR

## 2015-04-05 MED ORDER — HYDROXYZINE PAMOATE 25 MG PO CAPS: 25.0000 mg | ORAL_CAPSULE | Freq: Three times a day (TID) | ORAL | Status: DC | PRN

## 2015-04-05 MED ORDER — PREDNISONE 10 MG (21) PO TBPK: ORAL_TABLET | ORAL | Status: DC

## 2015-04-05 MED ORDER — HYDROXYZINE HCL 50 MG PO TABS
50.0000 mg | ORAL_TABLET | Freq: Once | ORAL | Status: AC
  Administered 2015-04-05: 50 mg via ORAL

## 2015-04-05 MED ORDER — HYDROXYZINE HCL 50 MG PO TABS
ORAL_TABLET | ORAL | Status: AC
  Filled 2015-04-05: qty 1

## 2015-04-05 MED ORDER — DEXAMETHASONE SODIUM PHOSPHATE 10 MG/ML IJ SOLN
INTRAMUSCULAR | Status: AC
  Administered 2015-04-05: 10 mg via INTRAMUSCULAR
  Filled 2015-04-05: qty 1

## 2015-04-05 NOTE — ED Notes (Signed)
Pt arrived to the ED accompanied by her husband for complaints of swelling of the face and wrist. Pt states that she was seen in this ED about a month ago for finger swelling caused by a poisonous plant ([place] thiz[place]) that she was removing from the garden. Today she is having similar symptoms on her right wrist and shin. Pt is AOx4 in no apparent distress.

## 2015-04-05 NOTE — ED Provider Notes (Signed)
[facility] Emergency Department Provider Note  ____________________________________________  Time seen: 2016   I have reviewed the triage vital signs and the nursing notes.   HISTORY  Chief Complaint Facial Swelling    HPI Holly Garner is a 34 y.o. female who arrives here today complaining of hand and facial swelling that has been going on and off for the last couple weeks states she was seen here a month ago placed on steroid-dependent histamines which seemed to resolve but since then she tried to avoid exposure to a complaint that she believed caused it but is still having symptoms is unsure of the exact cause currently arrives here today for further evaluation and treatment denies any throat swelling tongue swelling difficulty breathing or difficulty swallowing denies any pain fevers or chills   Past Medical History  Diagnosis Date  . No pertinent past medical history     There are no active problems to display for this patient.   Past Surgical History  Procedure Laterality Date  . Adnoidectomy    . Tonsillectomy and adenoidectomy    . Adenoidectomy      Current Outpatient Rx  Name  Route  Sig  Dispense  Refill  . hydrOXYzine (ATARAX/VISTARIL) 25 MG tablet   Oral   Take 1 tablet (25 mg total) by mouth 3 (three) times daily as needed.   12 tablet   0   . hydrOXYzine (VISTARIL) 25 MG capsule   Oral   Take 1 capsule (25 mg total) by mouth 3 (three) times daily as needed.   30 capsule   0   . loratadine (CLARITIN) 10 MG tablet   Oral   Take 1 tablet (10 mg total) by mouth daily.   30 tablet   2   . predniSONE (STERAPRED UNI-PAK 21 TAB) 10 MG (21) TBPK tablet      Take 6 tablets on day 1 Take 5 tablets on day 2 Take 4 tablets on day 3 Take 3 tablets on day 4 Take 2 tablets on day 5 Take 1 tablet on day 6   21 tablet   0   . prenatal vitamin w/FE, FA (PRENATAL 1 + 1) 27-1 MG TABS   Oral   Take 1 tablet by mouth daily.          . ranitidine (ZANTAC) 150 MG tablet   Oral   Take 1 tablet (150 mg total) by mouth at bedtime.   30 tablet   1   . ranitidine (ZANTAC) 150 MG tablet   Oral   Take 1 tablet (150 mg total) by mouth at bedtime.   30 tablet   0     Allergies Review of patient's allergies indicates no known allergies.  History reviewed. No pertinent family history.  Social History History  Substance Use Topics  . Smoking status: Current Every Day Smoker -- 1.00 packs/day for 10 years    Types: Cigarettes  . Smokeless tobacco: Never Used  . Alcohol Use: No    Review of Systems Constitutional: No fever/chills Eyes: No visual changes. ENT: No sore throat. Cardiovascular: Denies chest pain. Respiratory: Denies shortness of breath. Gastrointestinal: No abdominal pain.  No nausea, no vomiting.  No diarrhea.  No constipation. Genitourinary: Negative for dysuria. Musculoskeletal: Negative for back pain. Skin: Negative for rash. Neurological: Negative for headaches, focal weakness or numbness.  10-point ROS otherwise negative.  ____________________________________________   PHYSICAL EXAM:  VITAL SIGNS: ED Triage Vitals  Enc Vitals Group  BP 04/05/15 1947 132/95 mmHg     Pulse Rate 04/05/15 1947 97     Resp --      Temp 04/05/15 1947 98.2 F (36.8 C)     Temp Source 04/05/15 1947 Oral     SpO2 04/05/15 1947 99 %     Weight 04/05/15 1947 292 lb (132.45 kg)     Height 04/05/15 1947  (1.651 m)     Head Cir --      Peak Flow --      Pain Score 04/05/15 2000 7     Pain Loc --      Pain Edu? --      Excl. in GC? --     Constitutional: Alert and oriented. Well appearing and in no acute distress. Eyes: Conjunctivae are normal. PERRL. EOMI. Head: Atraumatic. Nose: No congestion/rhinnorhea. Mouth/Throat: Mucous membranes are moist.  Oropharynx non-erythematous. Neck: No stridor.   Cardiovascular: Normal rate, regular rhythm. Grossly normal heart sounds.  Good  peripheral circulation. Respiratory: Normal respiratory effort.  No retractions. Lungs CTAB. Musculoskeletal: No lower extremity tenderness nor edema.  No joint effusions. Neurologic:  Normal speech and language. No gross focal neurologic deficits are appreciated. Speech is normal. No gait instability. Skin:  Skin is warm, dry and intact. Mild erythema noted on her hands and forearms mild swelling under her lip Psychiatric: Mood and affect are normal. Speech and behavior are normal.  ____________________________________________      PROCEDURES  Procedure(s) performed: None  Critical Care performed: No  ____________________________________________   INITIAL IMPRESSION / ASSESSMENT AND PLAN / ED COURSE  Pertinent labs & imaging results that were available during my care of the patient were reviewed by me and considered in my medical decision making (see chart for details).  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

## 2015-04-25 ENCOUNTER — Encounter: Payer: Self-pay | Admitting: Emergency Medicine

## 2015-04-25 ENCOUNTER — Emergency Department
Admission: EM | Admit: 2015-04-25 | Discharge: 2015-04-26 | Disposition: A | Discharge status: Home / Self Care | Payer: Medicaid Other | Attending: Emergency Medicine | Admitting: Emergency Medicine

## 2015-04-25 DIAGNOSIS — L03811 Cellulitis of head [any part, except face]: Secondary | ICD-10-CM | POA: Diagnosis not present

## 2015-04-25 DIAGNOSIS — Z720 Tobacco use: Secondary | ICD-10-CM | POA: Diagnosis not present

## 2015-04-25 DIAGNOSIS — Z79899 Other long term (current) drug therapy: Secondary | ICD-10-CM | POA: Diagnosis not present

## 2015-04-25 DIAGNOSIS — R220 Localized swelling, mass and lump, head: Secondary | ICD-10-CM | POA: Diagnosis present

## 2015-04-25 MED ORDER — TRAMADOL HCL 50 MG PO TABS
100.0000 mg | ORAL_TABLET | Freq: Once | ORAL | Status: AC
  Administered 2015-04-26: 100 mg via ORAL

## 2015-04-25 MED ORDER — PREDNISONE 20 MG PO TABS
40.0000 mg | ORAL_TABLET | Freq: Once | ORAL | Status: AC
  Administered 2015-04-26: 40 mg via ORAL

## 2015-04-25 NOTE — ED Notes (Signed)
Pt arrived to the ED for complaints of having a bump on the back of the head. Pt states that she did not feel anything biting her.Pt has been in this ED numerous times for swelling in different areas of the body. Pt was refered to see an allergist but has not been able to get an appointment. Pt is AOx4 in no apparent distress.

## 2015-04-25 NOTE — ED Provider Notes (Signed)
[facility] Emergency Department Provider Note ____________________________________________  Time seen: 2350  I have reviewed the triage vital signs and the nursing notes.  HISTORY  Chief Complaint  Insect Bite  HPI Holly Garner is a 34 y.o. female reports to the ED with sudden onset of swelling to the back of the scalp head this morning when she woke. She denies injury, trauma, insect bite, or scalp infection. She reports intermittent episodes over the last 2 months, of migratory soft tissue swelling primarily to the hands and wrists, thought to be an allergic reaction or idiopathic histamine response. She has not been evaluated by [facility] as previously referred. She is here requesting treatment, which typically involves steroid management which resolves her symptoms. She denies fever, chills, sweats, difficulty breathing, or any other swelling.  Past Medical History  Diagnosis Date  . No pertinent past medical history    There are no active problems to display for this patient.   Past Surgical History  Procedure Laterality Date  . Adnoidectomy    . Tonsillectomy and adenoidectomy    . Adenoidectomy      Current Outpatient Rx  Name  Route  Sig  Dispense  Refill  . hydrOXYzine (ATARAX/VISTARIL) 25 MG tablet   Oral   Take 1 tablet (25 mg total) by mouth 3 (three) times daily as needed.   12 tablet   0   . hydrOXYzine (VISTARIL) 25 MG capsule   Oral   Take 1 capsule (25 mg total) by mouth 3 (three) times daily as needed.   30 capsule   0   . loratadine (CLARITIN) 10 MG tablet   Oral   Take 1 tablet (10 mg total) by mouth daily.   30 tablet   2   . predniSONE (DELTASONE) 10 MG tablet   Oral   Take 1 tablet (10 mg total) by mouth 2 (two) times daily with a meal.   10 tablet   0   . prenatal vitamin w/FE, FA (PRENATAL 1 + 1) 27-1 MG TABS   Oral   Take 1 tablet by mouth daily.           . ranitidine (ZANTAC) 150 MG tablet    Oral   Take 1 tablet (150 mg total) by mouth at bedtime.   30 tablet   1   . ranitidine (ZANTAC) 150 MG tablet   Oral   Take 1 tablet (150 mg total) by mouth at bedtime.   30 tablet   0   . traMADol (ULTRAM) 50 MG tablet   Oral   Take 1 tablet (50 mg total) by mouth 2 (two) times daily.   10 tablet   0    Allergies Review of patient's allergies indicates no known allergies.  History reviewed. No pertinent family history.  Social History History  Substance Use Topics  . Smoking status: Current Every Day Smoker -- 1.00 packs/day for 10 years    Types: Cigarettes  . Smokeless tobacco: Never Used  . Alcohol Use: No   Review of Systems  Constitutional: Negative for fever. Eyes: Negative for visual changes. ENT: Negative for sore throat. Cardiovascular: Negative for chest pain. Respiratory: Negative for shortness of breath. Gastrointestinal: Negative for abdominal pain, vomiting and diarrhea. Genitourinary: Negative for dysuria. Musculoskeletal: Negative for back pain. Skin: Negative for rash. Swelling to the scalp as above. Neurological: Negative for headaches, focal weakness or numbness. ____________________________________________  PHYSICAL EXAM:  VITAL SIGNS: ED Triage Vitals  Enc  Vitals Group     BP 04/25/15 2227 151/90 mmHg     Pulse Rate 04/25/15 2227 97     Resp 04/25/15 2227 18     Temp 04/25/15 2227 98.4 F (36.9 C)     Temp Source 04/25/15 2227 Oral     SpO2 04/25/15 2223 98 %     Weight 04/25/15 2227 292 lb (132.45 kg)     Height 04/25/15 2227 5\' 5"  (1.651 m)     Head Cir --      Peak Flow --      Pain Score 04/25/15 2228 6     Pain Loc --      Pain Edu? --      Excl. in GC? --    Constitutional: Alert and oriented. Well appearing and in no distress. Eyes: Conjunctivae are normal. PERRL. Normal extraocular movements. ENT   Head: Normocephalic and atraumatic.   Nose: No congestion/rhinnorhea.   Mouth/Throat: Mucous membranes are  moist.   Neck: Supple. No thyromegaly. Posterior scalp with local edema in a linear distribution, horizontally across the back of the head. Local erythema noted.  No punctum, boil, fluctuance, or pointing lesion noted.  Hematological/Lymphatic/Immunilogical: No cervical, occipital, or post-auricular lymphadenopathy. Cardiovascular: Normal rate, regular rhythm.  Respiratory: Normal respiratory effort. No wheezes/rales/rhonchi. Gastrointestinal: Soft and nontender. No distention. Musculoskeletal: Nontender with normal range of motion in all extremities.  Neurologic:  Normal gait without ataxia. Normal speech and language. No gross focal neurologic deficits are appreciated. Skin:  Skin is warm, dry and intact. No rash noted. Psychiatric: Mood and affect are normal. Patient exhibits appropriate insight and judgment. ____________________________________________  PROCEDURES  Prednisone 40 mg PO ____________________________________________  INITIAL IMPRESSION / ASSESSMENT AND PLAN / ED COURSE Patient advised to start prednisone 10 mg BID #10 and Ultram #10 as directed.  Apply warm compresses and follow-up with [facility] for allergy testing.  ____________________________________________  FINAL CLINICAL IMPRESSION(S) / ED DIAGNOSES  Final diagnoses:  Cellulitis of head or scalp     Lissa HoardJenise V Bacon Tiyanna Larcom, PA-C 04/26/15 0031  Darien Ramusavid W Kaminski, MD 04/28/15 1946

## 2015-04-26 MED ORDER — PREDNISONE 20 MG PO TABS
ORAL_TABLET | ORAL | Status: AC
  Administered 2015-04-26: 40 mg via ORAL
  Filled 2015-04-26: qty 2

## 2015-04-26 MED ORDER — TRAMADOL HCL 50 MG PO TABS: 50.0000 mg | ORAL_TABLET | Freq: Two times a day (BID) | ORAL | Status: DC

## 2015-04-26 MED ORDER — TRAMADOL HCL 50 MG PO TABS
ORAL_TABLET | ORAL | Status: AC
  Administered 2015-04-26: 100 mg via ORAL
  Filled 2015-04-26: qty 2

## 2015-04-26 MED ORDER — PREDNISONE 10 MG PO TABS: 10.0000 mg | ORAL_TABLET | Freq: Two times a day (BID) | ORAL | Status: DC

## 2015-04-26 NOTE — Discharge Instructions (Signed)
Cellulitis Cellulitis is an infection of the skin and the tissue under the skin. The infected area is usually red and tender. This happens most often in the arms and lower legs. HOME CARE   Take your antibiotic medicine as told. Finish the medicine even if you start to feel better.  Keep the infected arm or leg raised (elevated).  Put a warm cloth on the area up to 4 times per day.  Only take medicines as told by your doctor.  Keep all doctor visits as told. GET HELP IF:  You see red streaks on the skin coming from the infected area.  Your red area gets bigger or turns a dark color.  Your bone or joint under the infected area is painful after the skin heals.  Your infection comes back in the same area or different area.  You have a puffy (swollen) bump in the infected area.  You have new symptoms.  You have a fever. GET HELP RIGHT AWAY IF:   You feel very sleepy.  You throw up (vomit) or have watery poop (diarrhea).  You feel sick and have muscle aches and pains. MAKE SURE YOU:   Understand these instructions.  Will watch your condition.  Will get help right away if you are not doing well or get worse. Document Released: 03/26/2008 Document Revised: 02/22/2014 Document Reviewed: 12/24/2011 [facility] Patient Information 2015 [place], [place]. This information is not intended to replace advice given to you by your health care provider. Make sure you discuss any questions you have with your health care provider.  You should take the prescription meds as directed.  Follow-up with [facility] as planned.  Apply cool compresses to reduce swelling.

## 2015-06-08 ENCOUNTER — Encounter: Payer: Medicaid Other | Admitting: Obstetrics & Gynecology

## 2015-06-13 ENCOUNTER — Encounter: Payer: Medicaid Other | Admitting: Family Medicine

## 2015-09-05 ENCOUNTER — Emergency Department
Admission: EM | Admit: 2015-09-05 | Discharge: 2015-09-05 | Disposition: A | Discharge status: Home / Self Care | Payer: Medicaid Other | Attending: Emergency Medicine | Admitting: Emergency Medicine

## 2015-09-05 DIAGNOSIS — Z79899 Other long term (current) drug therapy: Secondary | ICD-10-CM | POA: Insufficient documentation

## 2015-09-05 DIAGNOSIS — Y998 Other external cause status: Secondary | ICD-10-CM | POA: Diagnosis not present

## 2015-09-05 DIAGNOSIS — Y9389 Activity, other specified: Secondary | ICD-10-CM | POA: Insufficient documentation

## 2015-09-05 DIAGNOSIS — M7989 Other specified soft tissue disorders: Secondary | ICD-10-CM

## 2015-09-05 DIAGNOSIS — F17210 Nicotine dependence, cigarettes, uncomplicated: Secondary | ICD-10-CM | POA: Diagnosis not present

## 2015-09-05 DIAGNOSIS — M79631 Pain in right forearm: Secondary | ICD-10-CM

## 2015-09-05 DIAGNOSIS — Y9289 Other specified places as the place of occurrence of the external cause: Secondary | ICD-10-CM | POA: Diagnosis not present

## 2015-09-05 DIAGNOSIS — T63441A Toxic effect of venom of bees, accidental (unintentional), initial encounter: Secondary | ICD-10-CM | POA: Diagnosis not present

## 2015-09-05 MED ORDER — METHYLPREDNISOLONE SODIUM SUCC 125 MG IJ SOLR
125.0000 mg | Freq: Once | INTRAMUSCULAR | Status: AC
  Administered 2015-09-05: 125 mg via INTRAVENOUS
  Filled 2015-09-05: qty 2

## 2015-09-05 MED ORDER — OXYCODONE-ACETAMINOPHEN 5-325 MG PO TABS: 1.0000 | ORAL_TABLET | ORAL | Status: DC | PRN

## 2015-09-05 MED ORDER — FAMOTIDINE IN NACL 20-0.9 MG/50ML-% IV SOLN
20.0000 mg | Freq: Once | INTRAVENOUS | Status: AC
  Administered 2015-09-05: 20 mg via INTRAVENOUS
  Filled 2015-09-05: qty 50

## 2015-09-05 MED ORDER — FAMOTIDINE 20 MG PO TABS: 20.0000 mg | ORAL_TABLET | Freq: Two times a day (BID) | ORAL | Status: DC

## 2015-09-05 MED ORDER — PREDNISONE 20 MG PO TABS: ORAL_TABLET | ORAL | Status: DC

## 2015-09-05 MED ORDER — DIPHENHYDRAMINE HCL 50 MG/ML IJ SOLN
25.0000 mg | Freq: Once | INTRAMUSCULAR | Status: AC
  Administered 2015-09-05: 25 mg via INTRAVENOUS
  Filled 2015-09-05: qty 1

## 2015-09-05 MED ORDER — OXYCODONE-ACETAMINOPHEN 5-325 MG PO TABS
1.0000 | ORAL_TABLET | Freq: Once | ORAL | Status: AC
  Administered 2015-09-05: 1 via ORAL
  Filled 2015-09-05: qty 1

## 2015-09-05 NOTE — ED Provider Notes (Signed)
[facility] Emergency Department Provider Note  ____________________________________________  Time seen: Approximately 1:57 AM  I have reviewed the triage vital signs and the nursing notes.   HISTORY  Chief Complaint Insect Bite    HPI Holly Garner is a 34 y.o. female who presents to the ED from home with a chief complaint of insect bite and right forearm swelling. 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 Since then, patient has experienced swelling in random parts of her body. She has been allergy-tested and also been tested for rheumatological diseases, all with negative workup.Her doctor provides her with ongoing prednisone and hydroxyzine prescription which patient takes as needed. Yesterday morning, patient awoke and noted swelling to the lateral aspect of her right forearm. She took prednisone 10 mg and hydroxyzine 25 mg yesterday morning. At approximately 5 PM, she was done by a yellow jacket near the location of her swelling. Patient reports localized welp, increased swelling and itchiness to her right forearm since. Patient denies associated symptoms of numbness, tingling or coolness to her arm. Denies recent travel or trauma. Denies recent fever, chills, chest pain, shortness of breath, abdominal pain, nausea, vomiting, diarrhea. Nothing makes her symptoms better or worse.   Past Medical History  Diagnosis Date  . No pertinent past medical history     There are no active problems to display for this patient.   Past Surgical History  Procedure Laterality Date  . Adnoidectomy    . Tonsillectomy and adenoidectomy    . Adenoidectomy      Current Outpatient Rx  Name  Route  Sig  Dispense  Refill  . hydrOXYzine (ATARAX/VISTARIL) 25 MG tablet   Oral   Take 1 tablet (25 mg total) by mouth 3 (three) times daily as needed.   12 tablet   0   . hydrOXYzine (VISTARIL) 25 MG capsule   Oral   Take 1 capsule  (25 mg total) by mouth 3 (three) times daily as needed.   30 capsule   0   . loratadine (CLARITIN) 10 MG tablet   Oral   Take 1 tablet (10 mg total) by mouth daily.   30 tablet   2   . predniSONE (DELTASONE) 10 MG tablet   Oral   Take 1 tablet (10 mg total) by mouth 2 (two) times daily with a meal.   10 tablet   0   . prenatal vitamin w/FE, FA (PRENATAL 1 + 1) 27-1 MG TABS   Oral   Take 1 tablet by mouth daily.           . ranitidine (ZANTAC) 150 MG tablet   Oral   Take 1 tablet (150 mg total) by mouth at bedtime.   30 tablet   1   . ranitidine (ZANTAC) 150 MG tablet   Oral   Take 1 tablet (150 mg total) by mouth at bedtime.   30 tablet   0   . traMADol (ULTRAM) 50 MG tablet   Oral   Take 1 tablet (50 mg total) by mouth 2 (two) times daily.   10 tablet   0     Allergies Review of patient's allergies indicates no known allergies.  No family history on file.  Social History Social History  Substance Use Topics  . Smoking status: Current Every Day Smoker -- 1.00 packs/day for 10 years    Types: Cigarettes  . Smokeless tobacco: Never Used  . Alcohol  Use: No    Review of Systems Constitutional: No fever/chills Eyes: No visual changes. ENT: No sore throat. Cardiovascular: Denies chest pain. Respiratory: Denies shortness of breath. Gastrointestinal: No abdominal pain.  No nausea, no vomiting.  No diarrhea.  No constipation. Genitourinary: Negative for dysuria. Musculoskeletal: Positive for right forearm pain, redness and swelling. Negative for back pain. Skin: Negative for rash. Neurological: Negative for headaches, focal weakness or numbness.  10-point ROS otherwise negative.  ____________________________________________   PHYSICAL EXAM:  VITAL SIGNS: ED Triage Vitals  Enc Vitals Group     BP 09/05/15 0122 186/11 mmHg     Pulse Rate 09/05/15 0122 105     Resp 09/05/15 0122 18     Temp 09/05/15 0122 98.1 F (36.7 C)     Temp Source 09/05/15  0122 Oral     SpO2 09/05/15 0122 99 %     Weight 09/05/15 0122 311 lb (141.069 kg)     Height 09/05/15 0122 5\' 6"  (1.676 m)     Head Cir --      Peak Flow --      Pain Score 09/05/15 0119 10     Pain Loc --      Pain Edu? --      Excl. in GC? --     Constitutional: Alert and oriented. Well appearing and in no acute distress. Eyes: Conjunctivae are normal. PERRL. EOMI. Head: Atraumatic. Nose: No congestion/rhinnorhea. Mouth/Throat: Mucous membranes are moist.  Oropharynx non-erythematous. There is no tongue swelling or angioedema. Neck: No stridor.   Cardiovascular: Normal rate, regular rhythm. Grossly normal heart sounds.  Good peripheral circulation. Respiratory: Normal respiratory effort.  No retractions. Lungs CTAB, no wheezing. Gastrointestinal: Soft and nontender. No distention. No abdominal bruits. No CVA tenderness. Musculoskeletal:  Right forearm with moderate swelling, localized redness at site of bee sting without warmth, erythema or fluctuance. There is no pallor. 2+ radial pulses. Brisk, less than 5 second capillary refill. Full range of motion without pain. Right forearm measures 34 cm; left forearm measures 31 cm. BUE symmetrically warm. Neurologic:  Normal speech and language. No gross focal neurologic deficits are appreciated. No gait instability. Skin:  Skin is warm, dry and intact. No rash noted. Psychiatric: Mood and affect are normal. Speech and behavior are normal.  ____________________________________________   LABS (all labs ordered are listed, but only abnormal results are displayed)  Labs Reviewed - No data to display ____________________________________________  EKG  None ____________________________________________  RADIOLOGY  None ____________________________________________   PROCEDURES  Procedure(s) performed: None  Critical Care performed: No  ____________________________________________   INITIAL IMPRESSION / ASSESSMENT AND PLAN /  ED COURSE  Pertinent labs & imaging results that were available during my care of the patient were reviewed by me and considered in my medical decision making (see chart for details).  34 year old female who presents with right arm swelling. Patient describes past episodes of angioedema. Yesterday morning patient had to take prednisone and hydroxyzine for right forearm swelling; subsequently she was stung in the same location by a bee. Right forearm is moderately swollen but patient remains neurovascularly intact and denies paresthesias at this time. Discussed with patient and her family member my concern for developing compartment syndrome. Will administer IV Solu-Medrol, Benadryl and Pepcid; elevate affected area and apply ice pack, and continue to observe patient in the emergency department for several hours with repeated measurements of her right forearm.  ----------------------------------------- 4:03 AM on 09/05/2015 -----------------------------------------  Patient resting in no acute distress. Right forearm  remains moderately swollen but feels more supple then on prior exam. Right forearm now measures 33.5 cm. Left forearm remeasured for comparison and remains at 31 cm. Right forearm without pallor; 2+ radial pulses; brisk, less than 5 second capillary refill; no poikilothermia, no paralysis. Patient still complains of pain which is not worse compared to prior exam. Will administer Percocet and continue to monitor.  ----------------------------------------- 5:09 AM on 09/05/2015 -----------------------------------------  Patient improved. Continued improvement in right forearm swelling. Right forearm now measures 33 cm. Left forearm remains at 31 cm. Patient remains neurovascularly intact. 2+ radial pulses, brisk, less than 5 second capillary refill. There is no pallor. Extremity is symmetrically warm compared to the left. There is no paralysis. Extensive discussion with patient and her  mother; will continue her on prednisone and Pepcid, follow-up with orthopedics today or tomorrow. Strict return precautions given. Both verbalize understanding and agree with plan of care.  ----------------------------------------- 6:45 AM on 09/05/2015 -----------------------------------------  I discussed patient's case with Dr. Rosita Kea (orthopedics on-call) via telephone who will expect patient in clinic either today or tomorrow for recheck. ____________________________________________   FINAL CLINICAL IMPRESSION(S) / ED DIAGNOSES  Final diagnoses:  Pain and swelling of forearm, right  Bee sting, accidental or unintentional, initial encounter      Irean Hong, MD 09/05/15 [phone]

## 2015-09-05 NOTE — Discharge Instructions (Signed)
1. Take the following medicines for the next 4 days: Prednisone $RemoveBeforeDE'60mg'rgbbRPDPADOZKod$  daily Pepcid $RemoveBefo'20mg'ZEdwIxmTtrh$  twice daily 2. Take your Hydroxyzine as needed for itching. 3. Return to the ER for recheck if you are unable to get an appointment with the orthopedist today or tomorrow. Return to the ER for sooner worsening symptoms, increased swelling, increased pain, pale or cold forearm, numbness/tingling or other concerns.  Bee, Wasp, or [employer], wasps, and hornets are part of a family of insects that can sting people. These stings can cause pain and inflammation, but they are usually not serious. However, some people may have an allergic reaction to a sting. This can cause the symptoms to be more severe.  SYMPTOMS  Common symptoms of this condition include:   A red lump in the skin that sometimes has a tiny hole in the center. In some cases, a stinger may be in the center of the wound.  Pain and itching at the sting site.  Redness and swelling around the sting site. If you have an allergic reaction (localized allergic reaction), the swelling and redness may spread out from the sting site. In some cases, this reaction can continue to develop over the next 12-36 hours. In rare cases, a person may have a severe allergic reaction (anaphylactic reaction) to a sting. Symptoms of an anaphylactic reaction may include:   Wheezing or difficulty breathing.  Raised, itchy, red patches on the skin.  Nausea or vomiting.  Abdominal cramping.  Diarrhea.  Chest pain.  Fainting.  Redness of the face (flushing). DIAGNOSIS  This condition is usually diagnosed based on symptoms, medical history, and a physical exam. TREATMENT  Most stings can be treated with:   Icing to reduce swelling.  Medicines (antihistamines) to treat itching or an allergic reaction.  Medicines to help reduce pain. These may be medicines that you take by mouth, or medicated creams or lotions that you apply to your skin. If you were  stung by a bee, the stinger and a small sac of poison may be in the wound. This may be removed by brushing across it with a flat card, such as a credit card. Another method is to pinch the area and pull it out. These methods can help reduce the severity of the body's reaction to the sting.  HOME CARE INSTRUCTIONS   Wash the sting site daily with soap and water as told by your health care provider.  Apply or take over-the-counter and prescription medicines only as told by your health care provider.  If directed, apply ice to the sting area.  Put ice in a plastic bag.  Place a towel between your skin and the bag.  Leave the ice on for 20 minutes, 2-3 times per day.  Do not scratch the sting area.  To lessen pain, try using a paste that is made of water and baking soda. Rub the paste on the sting area and leave it on for 5 minutes.  If you had a severe allergic reaction to a sting, you may need:  To wear a medical bracelet or necklace that lists the allergy.  To learn when and how to use an anaphylaxis kit or epinephrine injection. Your family members may also need to learn this.  To carry an anaphylaxis kit with you at all times. SEEK MEDICAL CARE IF:   Your symptoms do not get better in 2-3 days.  You have redness, swelling, or pain that spreads beyond the area of the sting.  You  have a fever. SEEK IMMEDIATE MEDICAL CARE IF:  You have symptoms of a severe allergic reaction. These include:   Wheezing or difficulty breathing.  Chest pain.  Light-headedness or fainting.  Itchy, raised, red patches on the skin.  Nausea or vomiting.  Abdominal cramping.  Diarrhea.   This information is not intended to replace advice given to you by your health care provider. Make sure you discuss any questions you have with your health care provider.   Document Released: 10/08/2005 Document Revised: 06/29/2015 Document Reviewed: 02/23/2015 Elsevier Interactive Patient Education  2016 Else[employer]  Angioedema Angioedema is a sudden swelling of tissues, often of the skin. It can occur on the face or genitals or in the abdomen or other body parts. The swelling usually develops over a short period and gets better in 24 to 48 hours. It often begins during the night and is found when the person wakes up. The person may also get red, itchy patches of skin (hives). Angioedema can be dangerous if it involves swelling of the air passages.  Depending on the cause, episodes of angioedema may only happen once, come back in unpredictable patterns, or repeat for several years and then gradually fade away.  CAUSES  Angioedema can be caused by an allergic reaction to various triggers. It can also result from nonallergic causes, including reactions to drugs, immune system disorders, viral infections, or an abnormal gene that is passed to you from your parents (hereditary). For some people with angioedema, the cause is unknown.  Some things that can trigger angioedema include:   Foods.   Medicines, such as ACE inhibitors, ARBs, nonsteroidal anti-inflammatory agents, or estrogen.   Latex.   Animal saliva.   Insect stings.   Dyes used in X-rays.   Mild injury.   Dental work.  Surgery.  Stress.   Sudden changes in temperature.   Exercise. SIGNS AND SYMPTOMS   Swelling of the skin.  Hives. If these are present, there is also intense itching.  Redness in the affected area.   Pain in the affected area.  Swollen lips or tongue.  Breathing problems. This may happen if the air passages swell.  Wheezing. If internal organs are involved, there may be:   Nausea.   Abdominal pain.   Vomiting.   Difficulty swallowing.   Difficulty passing urine. DIAGNOSIS   Your health care provider will examine the affected area and take a medical and family history.  Various tests may be done to help determine the cause. Tests may include:  Allergy skin tests  to see if the problem is an allergic reaction.   Blood tests to check for hereditary angioedema.   Tests to check for underlying diseases that could cause the condition.   A review of your medicines, including over-the-counter medicines, may be done. TREATMENT  Treatment will depend on the cause of the angioedema. Possible treatments include:   Removal of anything that triggered the condition (such as stopping certain medicines).   Medicines to treat symptoms or prevent attacks. Medicines given may include:   Antihistamines.   Epinephrine injection.   Steroids.   Hospitalization may be required for severe attacks. If the air passages are affected, it can be an emergency. Tubes may need to be placed to keep the airway open. HOME CARE INSTRUCTIONS   Take all medicines as directed by your health care provider.  If you were given medicines for emergency allergy treatment, always carry them with you.  Wear a medical bracelet as  directed by your health care provider.   Avoid known triggers. SEEK MEDICAL CARE IF:   You have repeat attacks of angioedema.   Your attacks are more frequent or more severe despite preventive measures.   You have hereditary angioedema and are considering having children. It is important to discuss with your health care provider the risks of passing the condition on to your children. SEEK IMMEDIATE MEDICAL CARE IF:   You have severe swelling of the mouth, tongue, or lips.  You have difficulty breathing.   You have difficulty swallowing.   You faint. MAKE SURE YOU:  Understand these instructions.  Will watch your condition.  Will get help right away if you are not doing well or get worse.   This information is not intended to replace advice given to you by your health care provider. Make sure you discuss any questions you have with your health care provider.   Document Released: 12/17/2001 Document Revised: 10/29/2014 Document  Reviewed: 06/01/2013 Elsevier Interactive Patient Education [employer].

## 2015-09-05 NOTE — ED Notes (Signed)
Pt states that she was stung by a yellow jacket this evening at 1700, pt states that she now has increased swelling to the rt forearm area and her arm is throbbing, pt's cap refill is within normal limits, pt has a strong radial pulse and is able to move her fingers without diff. Pt states that her arm is throbbing, pt took hydroxizine and prednisone at 1000 this am because when she woke up she had an area of swelling to the outer lower portion of the right forearm

## 2015-09-05 NOTE — ED Notes (Signed)
Pt ambulatory to triage with no difficulty. Pt reports she was stung by a bee about 8 hours ago and has redness and swelling to her entire right forearm region. Pt denies sob.

## 2016-01-05 ENCOUNTER — Encounter: Payer: Self-pay | Admitting: *Deleted

## 2016-01-05 ENCOUNTER — Inpatient Hospital Stay
Admission: EM | Admit: 2016-01-05 | Discharge: 2016-01-07 | DRG: 916 | Disposition: A | Discharge status: Home / Self Care | Payer: Medicaid Other | Attending: Internal Medicine | Admitting: Internal Medicine

## 2016-01-05 DIAGNOSIS — T783XXA Angioneurotic edema, initial encounter: Principal | ICD-10-CM | POA: Diagnosis present

## 2016-01-05 DIAGNOSIS — F17210 Nicotine dependence, cigarettes, uncomplicated: Secondary | ICD-10-CM | POA: Diagnosis present

## 2016-01-05 DIAGNOSIS — R070 Pain in throat: Secondary | ICD-10-CM | POA: Diagnosis present

## 2016-01-05 DIAGNOSIS — H1133 Conjunctival hemorrhage, bilateral: Secondary | ICD-10-CM | POA: Diagnosis present

## 2016-01-05 DIAGNOSIS — Z6841 Body Mass Index (BMI) 40.0 and over, adult: Secondary | ICD-10-CM

## 2016-01-05 DIAGNOSIS — J384 Edema of larynx: Secondary | ICD-10-CM

## 2016-01-05 LAB — CBC
HEMATOCRIT: 38.4 % (ref 35.0–47.0)
Hemoglobin: 12.3 g/dL (ref 12.0–16.0)
MCH: 24.8 pg — ABNORMAL LOW (ref 26.0–34.0)
MCHC: 32.1 g/dL (ref 32.0–36.0)
MCV: 77.2 fL — ABNORMAL LOW (ref 80.0–100.0)
PLATELETS: 373 10*3/uL (ref 150–440)
RBC: 4.98 MIL/uL (ref 3.80–5.20)
RDW: 16.1 % — AB (ref 11.5–14.5)
WBC: 14 10*3/uL — AB (ref 3.6–11.0)

## 2016-01-05 LAB — COMPREHENSIVE METABOLIC PANEL
ALBUMIN: 3.9 g/dL (ref 3.5–5.0)
ALK PHOS: 103 U/L (ref 38–126)
ALT: 24 U/L (ref 14–54)
ANION GAP: 6 (ref 5–15)
AST: 22 U/L (ref 15–41)
BUN: 16 mg/dL (ref 6–20)
CO2: 27 mmol/L (ref 22–32)
CREATININE: 0.81 mg/dL (ref 0.44–1.00)
Calcium: 9.2 mg/dL (ref 8.9–10.3)
Chloride: 103 mmol/L (ref 101–111)
GFR calc Af Amer: >60 mL/min (ref >60)
GFR calc non Af Amer: >60 mL/min (ref >60)
Glucose, Bld: 128 mg/dL — ABNORMAL HIGH (ref 65–99)
Potassium: 4 mmol/L (ref 3.5–5.1)
SODIUM: 136 mmol/L (ref 135–145)
TOTAL PROTEIN: 7.2 g/dL (ref 6.5–8.1)
Total Bilirubin: 0.3 mg/dL (ref 0.3–1.2)

## 2016-01-05 LAB — URINALYSIS COMPLETE WITH MICROSCOPIC (ARMC ONLY)
BILIRUBIN URINE: NEGATIVE
Bacteria, UA: NONE SEEN
GLUCOSE, UA: NEGATIVE mg/dL
Ketones, ur: NEGATIVE mg/dL
LEUKOCYTES UA: NEGATIVE
NITRITE: NEGATIVE
PH: 5 (ref 5.0–8.0)
Protein, ur: 30 mg/dL — AB
SPECIFIC GRAVITY, URINE: 1.025 (ref 1.005–1.030)

## 2016-01-05 LAB — POCT PREGNANCY, URINE: Preg Test, Ur: NEGATIVE

## 2016-01-05 MED ORDER — DEXAMETHASONE SODIUM PHOSPHATE 10 MG/ML IJ SOLN
10.0000 mg | Freq: Once | INTRAMUSCULAR | Status: AC
  Administered 2016-01-05: 10 mg via INTRAVENOUS
  Filled 2016-01-05: qty 1

## 2016-01-05 MED ORDER — FAMOTIDINE IN NACL 20-0.9 MG/50ML-% IV SOLN
20.0000 mg | Freq: Once | INTRAVENOUS | Status: AC
  Administered 2016-01-06: 20 mg via INTRAVENOUS
  Filled 2016-01-05: qty 50

## 2016-01-05 MED ORDER — EPINEPHRINE 0.3 MG/0.3ML IJ SOAJ
0.3000 mg | Freq: Once | INTRAMUSCULAR | Status: AC
  Administered 2016-01-05: 0.3 mg via INTRAMUSCULAR
  Filled 2016-01-05: qty 0.3

## 2016-01-05 MED ORDER — DIPHENHYDRAMINE HCL 50 MG/ML IJ SOLN
50.0000 mg | Freq: Once | INTRAMUSCULAR | Status: AC
  Administered 2016-01-05: 50 mg via INTRAVENOUS
  Filled 2016-01-05: qty 1

## 2016-01-05 NOTE — ED Notes (Addendum)
Pt to triage via wheelchair. Pt hyperventilating.  Pt states her throat hurts and feels like throat is closing up.  Pt reports vomiting x 4.  No abd pain.  Pt alert.  Pt reports she is on prednisone x 1 year for swelling of body.

## 2016-01-05 NOTE — ED Notes (Addendum)
Per dr Derrill Kaygoodman, no xrays at this time.  Pt placed in subwait with family.  Pt unable to void at this time.

## 2016-01-06 ENCOUNTER — Emergency Department: Payer: Medicaid Other

## 2016-01-06 ENCOUNTER — Encounter: Payer: Self-pay | Admitting: Radiology

## 2016-01-06 DIAGNOSIS — T783XXA Angioneurotic edema, initial encounter: Secondary | ICD-10-CM | POA: Diagnosis present

## 2016-01-06 DIAGNOSIS — H1133 Conjunctival hemorrhage, bilateral: Secondary | ICD-10-CM | POA: Diagnosis present

## 2016-01-06 DIAGNOSIS — F17210 Nicotine dependence, cigarettes, uncomplicated: Secondary | ICD-10-CM | POA: Diagnosis present

## 2016-01-06 DIAGNOSIS — R070 Pain in throat: Secondary | ICD-10-CM | POA: Diagnosis present

## 2016-01-06 DIAGNOSIS — Z6841 Body Mass Index (BMI) 40.0 and over, adult: Secondary | ICD-10-CM | POA: Diagnosis not present

## 2016-01-06 LAB — CREATININE, SERUM
CREATININE: 0.69 mg/dL (ref 0.44–1.00)
GFR calc non Af Amer: >60 mL/min (ref >60)

## 2016-01-06 LAB — CBC
HCT: 38.8 % (ref 35.0–47.0)
HEMOGLOBIN: 12.3 g/dL (ref 12.0–16.0)
MCH: 24.5 pg — AB (ref 26.0–34.0)
MCHC: 31.6 g/dL — ABNORMAL LOW (ref 32.0–36.0)
MCV: 77.4 fL — ABNORMAL LOW (ref 80.0–100.0)
Platelets: 353 10*3/uL (ref 150–440)
RBC: 5.01 MIL/uL (ref 3.80–5.20)
RDW: 15.6 % — ABNORMAL HIGH (ref 11.5–14.5)
WBC: 17 10*3/uL — ABNORMAL HIGH (ref 3.6–11.0)

## 2016-01-06 MED ORDER — ACETAMINOPHEN 650 MG RE SUPP: 650.0000 mg | Freq: Four times a day (QID) | RECTAL | Status: DC | PRN

## 2016-01-06 MED ORDER — NICOTINE 21 MG/24HR TD PT24
21.0000 mg | MEDICATED_PATCH | Freq: Every day | TRANSDERMAL | Status: DC
  Administered 2016-01-06 – 2016-01-07 (×2): 21 mg via TRANSDERMAL
  Filled 2016-01-06 (×2): qty 1

## 2016-01-06 MED ORDER — ONDANSETRON HCL 4 MG/2ML IJ SOLN: 4.0000 mg | Freq: Four times a day (QID) | INTRAMUSCULAR | Status: DC | PRN

## 2016-01-06 MED ORDER — SODIUM CHLORIDE 0.9 % IV SOLN
Freq: Once | INTRAVENOUS | Status: AC
  Administered 2016-01-06: 02:00:00 via INTRAVENOUS

## 2016-01-06 MED ORDER — MORPHINE SULFATE (PF) 2 MG/ML IV SOLN
2.0000 mg | INTRAVENOUS | Status: DC | PRN
  Administered 2016-01-06: 2 mg via INTRAVENOUS
  Filled 2016-01-06: qty 1

## 2016-01-06 MED ORDER — CLINDAMYCIN PHOSPHATE 900 MG/50ML IV SOLN
900.0000 mg | Freq: Once | INTRAVENOUS | Status: AC
  Administered 2016-01-06: 900 mg via INTRAVENOUS
  Filled 2016-01-06: qty 50

## 2016-01-06 MED ORDER — CLINDAMYCIN PHOSPHATE 600 MG/50ML IV SOLN
600.0000 mg | Freq: Three times a day (TID) | INTRAVENOUS | Status: DC
  Filled 2016-01-06 (×2): qty 50

## 2016-01-06 MED ORDER — CLINDAMYCIN PHOSPHATE 600 MG/50ML IV SOLN
600.0000 mg | Freq: Three times a day (TID) | INTRAVENOUS | Status: DC
  Administered 2016-01-06 – 2016-01-07 (×4): 600 mg via INTRAVENOUS
  Filled 2016-01-06 (×6): qty 50

## 2016-01-06 MED ORDER — ACETAMINOPHEN 325 MG PO TABS
650.0000 mg | ORAL_TABLET | Freq: Four times a day (QID) | ORAL | Status: DC | PRN
  Administered 2016-01-06: 650 mg via ORAL
  Filled 2016-01-06: qty 2

## 2016-01-06 MED ORDER — FAMOTIDINE 20 MG PO TABS
20.0000 mg | ORAL_TABLET | Freq: Two times a day (BID) | ORAL | Status: DC
  Administered 2016-01-06 – 2016-01-07 (×4): 20 mg via ORAL
  Filled 2016-01-06 (×4): qty 1

## 2016-01-06 MED ORDER — IOHEXOL 300 MG/ML  SOLN
75.0000 mL | Freq: Once | INTRAMUSCULAR | Status: AC | PRN
  Administered 2016-01-06: 75 mL via INTRAVENOUS

## 2016-01-06 MED ORDER — MORPHINE SULFATE (PF) 2 MG/ML IV SOLN
1.0000 mg | INTRAVENOUS | Status: DC | PRN
  Administered 2016-01-06: 1 mg via INTRAVENOUS
  Filled 2016-01-06 (×2): qty 1

## 2016-01-06 MED ORDER — DEXAMETHASONE SODIUM PHOSPHATE 10 MG/ML IJ SOLN
10.0000 mg | Freq: Four times a day (QID) | INTRAMUSCULAR | Status: DC
Start: 2016-01-06 — End: 2016-01-07
  Administered 2016-01-06 – 2016-01-07 (×6): 10 mg via INTRAVENOUS
  Filled 2016-01-06 (×6): qty 1

## 2016-01-06 MED ORDER — LORATADINE 10 MG PO TABS
10.0000 mg | ORAL_TABLET | Freq: Every day | ORAL | Status: DC
  Administered 2016-01-06 – 2016-01-07 (×2): 10 mg via ORAL
  Filled 2016-01-06 (×2): qty 1

## 2016-01-06 MED ORDER — ENOXAPARIN SODIUM 40 MG/0.4ML ~~LOC~~ SOLN
40.0000 mg | Freq: Two times a day (BID) | SUBCUTANEOUS | Status: DC
  Administered 2016-01-06: 40 mg via SUBCUTANEOUS
  Filled 2016-01-06: qty 0.4

## 2016-01-06 MED ORDER — ONDANSETRON HCL 4 MG PO TABS: 4.0000 mg | ORAL_TABLET | Freq: Four times a day (QID) | ORAL | Status: DC | PRN

## 2016-01-06 NOTE — Progress Notes (Signed)
[facility] - [place] [facility]   PATIENT NAME: Holly Garner    MR#:  [id number]  DATE OF BIRTH:  1981-04-14  SUBJECTIVE:  CHIEF COMPLAINT:  Patient is feeling fine. Denies any chest tightness or shortness of breath. Denies any difficulty with swallowing  REVIEW OF SYSTEMS:  CONSTITUTIONAL: No fever, fatigue or weakness.  EYES: No blurred or double vision.  EARS, NOSE, AND THROAT: No tinnitus or ear pain.  RESPIRATORY: No cough, shortness of breath, wheezing or hemoptysis.  CARDIOVASCULAR: No chest pain, orthopnea, edema.  GASTROINTESTINAL: No nausea, vomiting, diarrhea or abdominal pain.  GENITOURINARY: No dysuria, hematuria.  ENDOCRINE: No polyuria, nocturia,  HEMATOLOGY: No anemia, easy bruising or bleeding SKIN: No rash or lesion. MUSCULOSKELETAL: No joint pain or arthritis.   NEUROLOGIC: No tingling, numbness, weakness.  PSYCHIATRY: No anxiety or depression.   DRUG ALLERGIES:  No Known Allergies  VITALS:  Blood pressure 153/71, pulse 87, temperature 98 F (36.7 C), temperature source Oral, resp. rate 18, height  (1.676 m), weight 142.566 kg (314 lb 4.8 oz), last menstrual period 01/05/2016, SpO2 98 %, unknown if currently breastfeeding.  PHYSICAL EXAMINATION:  GENERAL:  35 y.o.-year-old patient lying in the bed with no acute distress.  EYES: Pupils equal, round, reactive to light and accommodation. No scleral icterus. Extraocular muscles intact.  HEENT: Head atraumatic, normocephalic.  nasopharynx clear. No tongue swelling NECK:  Supple, no jugular venous distention. No thyroid enlargement, no tenderness.  LUNGS: Normal breath sounds bilaterally, no wheezing, rales,rhonchi or crepitation. No use of accessory muscles of respiration.  CARDIOVASCULAR: S1, S2 normal. No murmurs, rubs, or gallops.  ABDOMEN: Soft, nontender, nondistended. Bowel sounds present. No organomegaly or mass.  EXTREMITIES: No pedal edema, cyanosis, or clubbing.   NEUROLOGIC: Cranial nerves II through XII are intact. Muscle strength 5/5 in all extremities. Sensation intact. Gait not checked.  PSYCHIATRIC: The patient is alert and oriented x 3.  SKIN: No obvious rash, lesion, or ulcer.    LABORATORY PANEL:   CBC  Recent Labs Lab 01/06/16 0223  WBC 17.0*  HGB 12.3  HCT 38.8  PLT 353   ------------------------------------------------------------------------------------------------------------------  Chemistries   Recent Labs Lab 01/05/16 2231 01/06/16 0223  NA 136  --   K 4.0  --   CL 103  --   CO2 27  --   GLUCOSE 128*  --   BUN 16  --   CREATININE 0.81 0.69  CALCIUM 9.2  --   AST 22  --   ALT 24  --   ALKPHOS 103  --   BILITOT 0.3  --    ------------------------------------------------------------------------------------------------------------------  Cardiac Enzymes No results for input(s): TROPONINI in the last 168 hours. ------------------------------------------------------------------------------------------------------------------  RADIOLOGY:  Ct Soft Tissue Neck W Contrast  01/06/2016  CLINICAL DATA:  Dyspnea with facial swelling.  Neck erythema. EXAM: CT NECK AND CHEST WITH CONTRAST TECHNIQUE: Multidetector CT imaging of the neck and chest was performed using the standard protocol following the bolus administration of intravenous contrast. CONTRAST:  75mL OMNIPAQUE IOHEXOL 300 MG/ML  SOLN COMPARISON:  None. FINDINGS: Pharynx and larynx: There is supraglottic submucosal edema with markedly thickened aryepiglottic folds, right more than left. No glottic or infraglottic involvement is noted. The epiglottis is not thickened. The airway remains patent. No prevertebral thickening or fluid. Lack of fat edema around the central compartment favors angioedema over supraglottitis, but the patient is noted to have leukocytosis. Salivary glands: Negative Thyroid: Negative Lymph nodes: Mild reactive appearing enlargement in  the  cervical chains Vascular: Major venous structures are patent. Limited intracranial: Negative Visualized orbits: Negative Mastoids and visualized paranasal sinuses: Partial opacification in the right more than left mastoid air cells with sclerosis, compatible with chronic mastoiditis. Skeleton: Negative MEDIASTINUM: Normal heart size. No pericardial effusion. No vascular abnormality. No adenopathy. LUNG WINDOWS: There is no edema, consolidation, effusion, or pneumothorax. UPPER ABDOMEN: No acute findings. OSSEOUS: No acute fracture.  No suspicious lytic or blastic lesions. These results were called by telephone at the time of interpretation on 01/06/2016 at 12:47 am to Dr. Bayard MalesANDOLPH BROWN , who verbally acknowledged these results. IMPRESSION: 1. Supraglottic larynx edema, worse on the right. Angioedema or supraglottitis could have this appearance. 2. Negative chest. Electronically Signed   By: Marnee SpringJonathon  Watts M.D.   On: 01/06/2016 00:50   Ct Chest W Contrast  01/06/2016  CLINICAL DATA:  Dyspnea with facial swelling.  Neck erythema. EXAM: CT NECK AND CHEST WITH CONTRAST TECHNIQUE: Multidetector CT imaging of the neck and chest was performed using the standard protocol following the bolus administration of intravenous contrast. CONTRAST:  75mL OMNIPAQUE IOHEXOL 300 MG/ML  SOLN COMPARISON:  None. FINDINGS: Pharynx and larynx: There is supraglottic submucosal edema with markedly thickened aryepiglottic folds, right more than left. No glottic or infraglottic involvement is noted. The epiglottis is not thickened. The airway remains patent. No prevertebral thickening or fluid. Lack of fat edema around the central compartment favors angioedema over supraglottitis, but the patient is noted to have leukocytosis. Salivary glands: Negative Thyroid: Negative Lymph nodes: Mild reactive appearing enlargement in the cervical chains Vascular: Major venous structures are patent. Limited intracranial: Negative Visualized orbits:  Negative Mastoids and visualized paranasal sinuses: Partial opacification in the right more than left mastoid air cells with sclerosis, compatible with chronic mastoiditis. Skeleton: Negative MEDIASTINUM: Normal heart size. No pericardial effusion. No vascular abnormality. No adenopathy. LUNG WINDOWS: There is no edema, consolidation, effusion, or pneumothorax. UPPER ABDOMEN: No acute findings. OSSEOUS: No acute fracture.  No suspicious lytic or blastic lesions. These results were called by telephone at the time of interpretation on 01/06/2016 at 12:47 am to Dr. Bayard MalesANDOLPH BROWN , who verbally acknowledged these results. IMPRESSION: 1. Supraglottic larynx edema, worse on the right. Angioedema or supraglottitis could have this appearance. 2. Negative chest. Electronically Signed   By: Marnee SpringJonathon  Watts M.D.   On: 01/06/2016 00:50    EKG:   Orders placed or performed in visit on 09/04/09  . EKG 12-Lead    ASSESSMENT AND PLAN:   Holly Garner is a 35 y.o. female with no past medical history comes to the emergency room after she started experiencing significant throat pain and "something stuck in my throat"patient reports having violent bout of vomiting after she ate a sub-sandwich. She has eaten this sandwich in the past.   1. Sore throat with abnormal soft tissue CT neck suggestive of supraglottic submucosal edema with markedly thickened aryepiglottic fold suggestive of possible angioedema -Exact etiology to allergy not clear - IV Decadron 10 mg every 6 hourly, famotidine, Claritin -Empiric antibiotic with IV clindamycin discontinued as recommended by ENT They're recommending to discharge patient with 60 mg of prednisone for the first 3 days followed by 10 mg tapering and to continue Benadryl RAST testing is done -EpiPen as needed and discharge home with dual pack of EpiPen -Patient is stable respiratory wise.   2. Morbid obesity-patient will be benefited with lifestyle modifications with diet and  exercise   3. DVT prophylaxis subcutaneous Lovenox  All the records are reviewed and case discussed with Care Management/Social Workerr. Management plans discussed with the patient, family and they are in agreement.  CODE STATUS: fc   TOTAL TIME TAKING CARE OF THIS PATIENT: 35 minutes.   POSSIBLE D/C IN 1-2 DAYS, DEPENDING ON CLINICAL CONDITION.   Ramonita Lab M.D on 01/06/2016 at 4:16 PM  Between 7am to 6pm - Pager - [phone] After 6pm go to [email or web address] - password [email or web address] [facility]  [place] [facility]  Office  [phone]  CC: Primary care physician; Evelene Croon, MD

## 2016-01-06 NOTE — Plan of Care (Signed)
Pt and RN had discussion about unsafe situation of leaving today against dr. Jarrett Ablesrders.  Since throat is about 50% restricted, dr. Is uncomfortable with discharge for today.  Pt informed she would need to leave AMA if choosing to leave today.  Pt verbalized understanding and stated that she would reconsider options and let staff know her decision.

## 2016-01-06 NOTE — ED Provider Notes (Signed)
[facility] Emergency Department Provider Note  ____________________________________________  Time seen: 11:30 AM  I have reviewed the triage vital signs and the nursing notes.   HISTORY  Chief Complaint Emesis and Hyperventilating     HPI Holly Garner is a 35 y.o. female presents with acute onset of difficulty swallowing and breathing approximately one hour before presentation to the emergency department. Patient states that she is had intermittent episodes of "swelling to her hands and other portions of her body for which she seen her primary care doctor Dr. Lacie ScottsNiemeyer who prescribed her prednisone. She states that the episode tonight was abrupt in onset results and in changing her voice    Past Medical History  Diagnosis Date  . No pertinent past medical history     Patient Active Problem List   Diagnosis Date Noted  . Throat pain in adult 01/06/2016    Past Surgical History  Procedure Laterality Date  . Adnoidectomy    . Tonsillectomy and adenoidectomy    . Adenoidectomy      No current outpatient prescriptions on file.  Allergies No known drug allergies No family history on file.  Social History Social History  Substance Use Topics  . Smoking status: Current Every Day Smoker -- 1.00 packs/day for 10 years    Types: Cigarettes  . Smokeless tobacco: Never Used  . Alcohol Use: No    Review of Systems  Constitutional: Negative for fever. Eyes: Negative for visual changes. ENT: Negative for sore throat. Positive for difficulty swallowing Cardiovascular: Negative for chest pain. Respiratory: Positive for shortness of breath. Gastrointestinal: Negative for abdominal pain, vomiting and diarrhea. Genitourinary: Negative for dysuria. Musculoskeletal: Negative for back pain. Skin: Negative for rash. Neurological: Negative for headaches, focal weakness or numbness.   10-point ROS otherwise  negative.  ____________________________________________   PHYSICAL EXAM:  VITAL SIGNS: ED Triage Vitals  Enc Vitals Group     BP 01/05/16 2226 151/93 mmHg     Pulse Rate 01/05/16 2226 100     Resp 01/05/16 2226 30     Temp 01/05/16 2226 97.7 F (36.5 C)     Temp Source 01/05/16 2226 Oral     SpO2 01/05/16 2226 100 %     Weight 01/05/16 2226 316 lb (143.337 kg)     Height 01/05/16 2226 5\' 6"  (1.676 m)     Head Cir --      Peak Flow --      Pain Score 01/05/16 2226 10     Pain Loc --      Pain Edu? --      Excl. in GC? --      Constitutional: Alert and oriented. Apparent distress. Muffled voice Eyes: Conjunctivae are normal. PERRL. Normal extraocular movements. ENT   Head: Normocephalic and atraumatic.   Nose: No congestion/rhinnorhea.   Mouth/Throat: Mucous membranes are moist.   Neck: Positive stridor. Hematological/Lymphatic/Immunilogical: No cervical lymphadenopathy. Cardiovascular: Normal rate, regular rhythm. Normal and symmetric distal pulses are present in all extremities. No murmurs, rubs, or gallops. Respiratory: Normal respiratory effort without tachypnea nor retractions. Breath sounds are clear and equal bilaterally. No wheezes/rales/rhonchi. Gastrointestinal: Soft and nontender. No distention. There is no CVA tenderness. Genitourinary: deferred Musculoskeletal: Nontender with normal range of motion in all extremities. No joint effusions.  No lower extremity tenderness nor edema. Neurologic:  Normal speech and language. No gross focal neurologic deficits are appreciated. Speech is normal.  Skin:  Skin is warm, dry and intact. No rash noted. Psychiatric:  Mood and affect are normal. Speech and behavior are normal. Patient exhibits appropriate insight and judgment.  ____________________________________________    LABS (pertinent positives/negatives) Labs Reviewed  COMPREHENSIVE METABOLIC PANEL - Abnormal; Notable for the following:    Glucose, Bld  128 (*)    All other components within normal limits  CBC - Abnormal; Notable for the following:    WBC 14.0 (*)    MCV 77.2 (*)    MCH 24.8 (*)    RDW 16.1 (*)    All other components within normal limits  URINALYSIS COMPLETEWITH MICROSCOPIC ([facility] ONLY) - Abnormal; Notable for the following:    Color, Urine YELLOW (*)    APPearance HAZY (*)    Hgb urine dipstick 1+ (*)    Protein, ur 30 (*)    Squamous Epithelial / LPF 6-30 (*)    All other components within normal limits  CBC - Abnormal; Notable for the following:    WBC 17.0 (*)    MCV 77.4 (*)    MCH 24.5 (*)    MCHC 31.6 (*)    RDW 15.6 (*)    All other components within normal limits  CREATININE, SERUM  POCT PREGNANCY, URINE        RADIOLOGY  CT Soft Tissue Neck W Contrast (Final result) Result time: 01/06/16 00:50:39   Final result by Rad Results In Interface (01/06/16 00:50:39)   Narrative:   CLINICAL DATA: Dyspnea with facial swelling. Neck erythema.  EXAM: CT NECK AND CHEST WITH CONTRAST  TECHNIQUE: Multidetector CT imaging of the neck and chest was performed using the standard protocol following the bolus administration of intravenous contrast.  CONTRAST: 75mL OMNIPAQUE IOHEXOL 300 MG/ML SOLN  COMPARISON: None.  FINDINGS: Pharynx and larynx: There is supraglottic submucosal edema with markedly thickened aryepiglottic folds, right more than left. No glottic or infraglottic involvement is noted. The epiglottis is not thickened. The airway remains patent. No prevertebral thickening or fluid. Lack of fat edema around the central compartment favors angioedema over supraglottitis, but the patient is noted to have leukocytosis.  Salivary glands: Negative  Thyroid: Negative  Lymph nodes: Mild reactive appearing enlargement in the cervical chains  Vascular: Major venous structures are patent.  Limited intracranial: Negative  Visualized orbits: Negative  Mastoids and visualized paranasal  sinuses: Partial opacification in the right more than left mastoid air cells with sclerosis, compatible with chronic mastoiditis.  Skeleton: Negative  MEDIASTINUM:  Normal heart size. No pericardial effusion. No vascular abnormality. No adenopathy.  LUNG WINDOWS:  There is no edema, consolidation, effusion, or pneumothorax.  UPPER ABDOMEN:  No acute findings.  OSSEOUS:  No acute fracture. No suspicious lytic or blastic lesions.  These results were called by telephone at the time of interpretation on 01/06/2016 at 12:47 am to Dr. Bayard Males , who verbally acknowledged these results.  IMPRESSION: 1. Supraglottic larynx edema, worse on the right. Angioedema or supraglottitis could have this appearance. 2. Negative chest.   Electronically Signed By: Marnee Spring M.D. On: 01/06/2016 00:50          CT Chest W Contrast (Final result) Result time: 01/06/16 00:50:39   Final result by Rad Results In Interface (01/06/16 00:50:39)   Narrative:   CLINICAL DATA: Dyspnea with facial swelling. Neck erythema.  EXAM: CT NECK AND CHEST WITH CONTRAST  TECHNIQUE: Multidetector CT imaging of the neck and chest was performed using the standard protocol following the bolus administration of intravenous contrast.  CONTRAST: 75mL OMNIPAQUE IOHEXOL 300 MG/ML SOLN  COMPARISON:  None.  FINDINGS: Pharynx and larynx: There is supraglottic submucosal edema with markedly thickened aryepiglottic folds, right more than left. No glottic or infraglottic involvement is noted. The epiglottis is not thickened. The airway remains patent. No prevertebral thickening or fluid. Lack of fat edema around the central compartment favors angioedema over supraglottitis, but the patient is noted to have leukocytosis.  Salivary glands: Negative  Thyroid: Negative  Lymph nodes: Mild reactive appearing enlargement in the cervical chains  Vascular: Major venous structures are  patent.  Limited intracranial: Negative  Visualized orbits: Negative  Mastoids and visualized paranasal sinuses: Partial opacification in the right more than left mastoid air cells with sclerosis, compatible with chronic mastoiditis.  Skeleton: Negative  MEDIASTINUM:  Normal heart size. No pericardial effusion. No vascular abnormality. No adenopathy.  LUNG WINDOWS:  There is no edema, consolidation, effusion, or pneumothorax.  UPPER ABDOMEN:  No acute findings.  OSSEOUS:  No acute fracture. No suspicious lytic or blastic lesions.  These results were called by telephone at the time of interpretation on 01/06/2016 at 12:47 am to Dr. Bayard Males , who verbally acknowledged these results.  IMPRESSION: 1. Supraglottic larynx edema, worse on the right. Angioedema or supraglottitis could have this appearance. 2. Negative chest.   Electronically Signed By: Marnee Spring M.D. On: 01/06/2016 00:50       Critical Care performed: CRITICAL CARE Performed by: Darci Current   Total critical care time: 45 minutes  Critical care time was exclusive of separately billable procedures and treating other patients.  Critical care was necessary to treat or prevent imminent or life-threatening deterioration.  Critical care was time spent personally by me on the following activities: development of treatment plan with patient and/or surrogate as well as nursing, discussions with consultants, evaluation of patient's response to treatment, examination of patient, obtaining history from patient or surrogate, ordering and performing treatments and interventions, ordering and review of laboratory studies, ordering and review of radiographic studies, pulse oximetry and re-evaluation of patient's condition.   ____________________________________________   INITIAL IMPRESSION / ASSESSMENT AND PLAN / ED COURSE  Pertinent labs & imaging results that were available during my  care of the patient were reviewed by me and considered in my medical decision making (see chart for details).   She received Decadron 10 mg IV, Benadryl 50 mg Pepcid & Epinephrine with improvement in respiratory status and ability to swallow.Patient discussed Dr. Grace Isaac and Dr Allena Katz.    ____________________________________________   FINAL CLINICAL IMPRESSION(S) / ED DIAGNOSES  Final diagnoses:  Supraglottic edema      Darci Current, MD 01/06/16 [phone]

## 2016-01-06 NOTE — Progress Notes (Signed)
Order for lovenox 40 mg subcutaneously daily for DVT prophylaxis was changed to 40 mg BID per anticoagulation protocol for CrCl > 30 mL/min and BMI > 40.  Cindi CarbonMary M Bessy Reaney, PharmD

## 2016-01-06 NOTE — Care Management Note (Signed)
Case Management Note  Patient Details  Name: Holly Garner Clapper MRN: [id number] Date of Birth: 07/23/1981  Subjective/Objective:                   Entered room with patient permission to do psychosocial assessment and asked her husband to give me a few minutes to talk to her- he willingly left room. I expressed concern to patient about frequent visits to the ED for "injury" like complaints however patient states that she "swells up randomly". She relates this to stress or allergies. She is followed by Dr. Lacie ScottsNiemeyer (Medicaid assignment) and states "he drew blood to see what I was allergic too but did not put me on an allergy medication; said it was mild". However, patient visit this time was from eating a sub that she said made her feel like something was in her throat. Her eyes are really red and swollen. Apparently she uses an EPIPEN frequently injecting in her thigh. She smokes. She denies abuse or need from CM. Action/Plan:   Case closed.   Expected Discharge Date:                  Expected Discharge Plan:     In-House Referral:     Discharge planning Services  CM Consult  Post Acute Care Choice:    Choice offered to:  Patient  DME Arranged:    DME Agency:     HH Arranged:    HH Agency:     Status of Service:  Completed, signed off  Medicare Important Message Given:    Date Medicare IM Given:    Medicare IM give by:    Date Additional Medicare IM Given:    Additional Medicare Important Message give by:     If discussed at Long Length of Stay Meetings, dates discussed:    Additional Comments:  Collie Siadngela Oluwatomiwa Kinyon, RN 01/06/2016, 11:50 AM

## 2016-01-06 NOTE — H&P (Signed)
[facility] [place] [facility]   PATIENT NAME: Holly Garner    MR#:  [id number]  DATE OF BIRTH:  July 30, 1981  DATE OF ADMISSION:  01/05/2016  PRIMARY CARE PHYSICIAN: Evelene Croon, MD   REQUESTING/REFERRING PHYSICIAN: Dr. Manson Passey  CHIEF COMPLAINT:   Sore throat, "" something stuck in my throat and vomiting HISTORY OF PRESENT ILLNESS:  Holly Garner  is a 35 y.o. female with no past medical history comes to the emergency room after she started experiencing significant throat pain and "something stuck in my throat"patient reports having violent bout of vomiting after she ate a sub-sandwich. She has eaten this sandwich  in the past. She started noticing bogginess and redness in her eyes was feeling very uncomfortable came to the emergency room and CT soft tissue neck showed supraglottic submucosal edema with markedly thickened arypiglottic folds right more than left. Patient received a dose of IV Decadron and IV clindamycin. She is being admitted for possible angioedema versus throat infection  PAST MEDICAL HISTORY:   Past Medical History  Diagnosis Date  . No pertinent past medical history     PAST SURGICAL HISTOIRY:   Past Surgical History  Procedure Laterality Date  . Adnoidectomy    . Tonsillectomy and adenoidectomy    . Adenoidectomy      SOCIAL HISTORY:   Social History  Substance Use Topics  . Smoking status: Current Every Day Smoker -- 1.00 packs/day for 10 years    Types: Cigarettes  . Smokeless tobacco: Never Used  . Alcohol Use: No    FAMILY HISTORY:  No family history on file.  DRUG ALLERGIES:  No Known Allergies  REVIEW OF SYSTEMS:  Review of Systems  Constitutional: Negative for fever, chills and weight loss.  HENT: Positive for sore throat. Negative for ear discharge, ear pain and nosebleeds.   Eyes: Negative for blurred vision, pain and discharge.  Respiratory: Negative for sputum production, shortness of  breath, wheezing and stridor.   Cardiovascular: Negative for chest pain, palpitations, orthopnea and PND.  Gastrointestinal: Positive for vomiting. Negative for nausea, abdominal pain and diarrhea.  Genitourinary: Negative for urgency and frequency.  Musculoskeletal: Negative for back pain and joint pain.  Neurological: Negative for sensory change, speech change, focal weakness and weakness.  Psychiatric/Behavioral: Negative for depression and hallucinations. The patient is not nervous/anxious.   All other systems reviewed and are negative.    MEDICATIONS AT HOME:   Prior to Admission medications   Medication Sig Start Date End Date Taking? Authorizing Provider  famotidine (PEPCID) 20 MG tablet Take 1 tablet (20 mg total) by mouth 2 (two) times daily. 09/05/15   Irean Hong, MD  hydrOXYzine (ATARAX/VISTARIL) 25 MG tablet Take 1 tablet (25 mg total) by mouth 3 (three) times daily as needed. 02/21/15   III Kristine Garbe Ruffian, PA-C  hydrOXYzine (VISTARIL) 25 MG capsule Take 1 capsule (25 mg total) by mouth 3 (three) times daily as needed. 04/05/15   III William C Ruffian, PA-C  loratadine (CLARITIN) 10 MG tablet Take 1 tablet (10 mg total) by mouth daily. 04/05/15 04/04/16  III William C Ruffian, PA-C  oxyCODONE-acetaminophen (ROXICET) 5-325 MG tablet Take 1 tablet by mouth every 4 (four) hours as needed for severe pain. 09/05/15   Irean Hong, MD  predniSONE (DELTASONE) 20 MG tablet 3 tablets daily 4 days 09/05/15   Irean Hong, MD  prenatal vitamin w/FE, FA (PRENATAL 1 + 1) 27-1 MG TABS Take 1 tablet by  mouth daily.      Historical Provider, MD  ranitidine (ZANTAC) 150 MG tablet Take 1 tablet (150 mg total) by mouth at bedtime. 02/21/15 02/21/16  III William C Ruffian, PA-C  ranitidine (ZANTAC) 150 MG tablet Take 1 tablet (150 mg total) by mouth at bedtime. 04/05/15 04/04/16  III William C Ruffian, PA-C  traMADol (ULTRAM) 50 MG tablet Take 1 tablet (50 mg total) by mouth 2 (two) times daily. 04/26/15    Jenise V Bacon Menshew, PA-C      VITAL SIGNS:  Blood pressure 140/64, pulse 97, temperature 97.7 F (36.5 C), temperature source Oral, resp. rate 22, height  (1.676 m), weight 143.337 kg (316 lb), last menstrual period 01/05/2016, SpO2 99 %, unknown if currently breastfeeding.  PHYSICAL EXAMINATION:  GENERAL:  35 y.o.-year-old patient lying in the bed with no acute distress.  EYES: Pupils equal, round, reactive to light and accommodation. No scleral icterus. Extraocular muscles intact.  HEENT: Head atraumatic, normocephalic. Oropharynx and nasopharynx clear.  NECK:  Supple, no jugular venous distention. No thyroid enlargement, no tenderness.  LUNGS: Normal breath sounds bilaterally, no wheezing, rales,rhonchi or crepitation. No use of accessory muscles of respiration.  CARDIOVASCULAR: S1, S2 normal. No murmurs, rubs, or gallops.  ABDOMEN: Soft, nontender, nondistended. Bowel sounds present. No organomegaly or mass.  EXTREMITIES: No pedal edema, cyanosis, or clubbing.  NEUROLOGIC: Cranial nerves II through XII are intact. Muscle strength 5/5 in all extremities. Sensation intact. Gait not checked.  PSYCHIATRIC: The patient is alert and oriented x 3.  SKIN: No obvious rash, lesion, or ulcer.   LABORATORY PANEL:   CBC  Recent Labs Lab 01/05/16 2231  WBC 14.0*  HGB 12.3  HCT 38.4  PLT 373   ------------------------------------------------------------------------------------------------------------------  Chemistries   Recent Labs Lab 01/05/16 2231  NA 136  K 4.0  CL 103  CO2 27  GLUCOSE 128*  BUN 16  CREATININE 0.81  CALCIUM 9.2  AST 22  ALT 24  ALKPHOS 103  BILITOT 0.3   ------------------------------------------------------------------------------------------------------------------  Cardiac Enzymes No results for input(s): TROPONINI in the last 168  hours. ------------------------------------------------------------------------------------------------------------------  RADIOLOGY:  Ct Soft Tissue Neck W Contrast  01/06/2016  CLINICAL DATA:  Dyspnea with facial swelling.  Neck erythema. EXAM: CT NECK AND CHEST WITH CONTRAST TECHNIQUE: Multidetector CT imaging of the neck and chest was performed using the standard protocol following the bolus administration of intravenous contrast. CONTRAST:  75mL OMNIPAQUE IOHEXOL 300 MG/ML  SOLN COMPARISON:  None. FINDINGS: Pharynx and larynx: There is supraglottic submucosal edema with markedly thickened aryepiglottic folds, right more than left. No glottic or infraglottic involvement is noted. The epiglottis is not thickened. The airway remains patent. No prevertebral thickening or fluid. Lack of fat edema around the central compartment favors angioedema over supraglottitis, but the patient is noted to have leukocytosis. Salivary glands: Negative Thyroid: Negative Lymph nodes: Mild reactive appearing enlargement in the cervical chains Vascular: Major venous structures are patent. Limited intracranial: Negative Visualized orbits: Negative Mastoids and visualized paranasal sinuses: Partial opacification in the right more than left mastoid air cells with sclerosis, compatible with chronic mastoiditis. Skeleton: Negative MEDIASTINUM: Normal heart size. No pericardial effusion. No vascular abnormality. No adenopathy. LUNG WINDOWS: There is no edema, consolidation, effusion, or pneumothorax. UPPER ABDOMEN: No acute findings. OSSEOUS: No acute fracture.  No suspicious lytic or blastic lesions. These results were called by telephone at the time of interpretation on 01/06/2016 at 12:47 am to Dr. Bayard Males , who verbally acknowledged these results. IMPRESSION:  1. Supraglottic larynx edema, worse on the right. Angioedema or supraglottitis could have this appearance. 2. Negative chest. Electronically Signed   By: Marnee SpringJonathon  Watts  M.D.   On: 01/06/2016 00:50   Ct Chest W Contrast  01/06/2016  CLINICAL DATA:  Dyspnea with facial swelling.  Neck erythema. EXAM: CT NECK AND CHEST WITH CONTRAST TECHNIQUE: Multidetector CT imaging of the neck and chest was performed using the standard protocol following the bolus administration of intravenous contrast. CONTRAST:  75mL OMNIPAQUE IOHEXOL 300 MG/ML  SOLN COMPARISON:  None. FINDINGS: Pharynx and larynx: There is supraglottic submucosal edema with markedly thickened aryepiglottic folds, right more than left. No glottic or infraglottic involvement is noted. The epiglottis is not thickened. The airway remains patent. No prevertebral thickening or fluid. Lack of fat edema around the central compartment favors angioedema over supraglottitis, but the patient is noted to have leukocytosis. Salivary glands: Negative Thyroid: Negative Lymph nodes: Mild reactive appearing enlargement in the cervical chains Vascular: Major venous structures are patent. Limited intracranial: Negative Visualized orbits: Negative Mastoids and visualized paranasal sinuses: Partial opacification in the right more than left mastoid air cells with sclerosis, compatible with chronic mastoiditis. Skeleton: Negative MEDIASTINUM: Normal heart size. No pericardial effusion. No vascular abnormality. No adenopathy. LUNG WINDOWS: There is no edema, consolidation, effusion, or pneumothorax. UPPER ABDOMEN: No acute findings. OSSEOUS: No acute fracture.  No suspicious lytic or blastic lesions. These results were called by telephone at the time of interpretation on 01/06/2016 at 12:47 am to Dr. Bayard MalesANDOLPH BROWN , who verbally acknowledged these results. IMPRESSION: 1. Supraglottic larynx edema, worse on the right. Angioedema or supraglottitis could have this appearance. 2. Negative chest. Electronically Signed   By: Marnee SpringJonathon  Watts M.D.   On: 01/06/2016 00:50    EKG:    IMPRESSION AND PLAN:   Holly Garner  is a 35 y.o. female with no  past medical history comes to the emergency room after she started experiencing significant throat pain and "something stuck in my throat"patient reports having violent bout of vomiting after she ate a sub-sandwich. She has eaten this sandwich  in the past.   1. Sore throat with abnormal soft tissue CT neck suggestive of supraglottic submucosal edema with markedly thickened aryepiglottic fold suggestive of possible angioedema -Exact etiology to allergy not clear -We'll give IV Decadron 10 mg every 6 hourly, famotidine, Claritin -Empiric antibiotic with IV clindamycin -ENT consultation in a.m. in the morning -Patient is stable respiratory wise. Her sats are 100% on room air  2. Morbid obesity  3. DVT prophylaxis subcutaneous Lovenox  The above was discussed with patient and patient's family members. Further workup according to patient's clinical course.    All the records are reviewed and case discussed with ED provider. Management plans discussed with the patient, family and they are in agreement.  CODE STATUS: Full  TOTAL TIME TAKING CARE OF THIS PATIENT: 50 minutes.    Shawnetta Lein M.D on 01/06/2016 at 1:15 AM  Between 7am to 6pm - Pager - [phone]  After 6pm go to [email or web address] - password [email or web address]  [facility]  Office  [phone]  CC: Primary care physician; Evelene CroonNIEMEYER, MEINDERT, MD

## 2016-01-06 NOTE — Progress Notes (Signed)
Patient ID: Holly Garner, female   DOB: 1981/04/02, 35 y.o.   MRN: [id number] [place], Neuroth [id number] 12-22-1980 Riley Nearing, MD  Reason for Consult: Throat swelling Requesting Physician: Nicholes Mango, MD Consulting Physician: Riley Nearing  HPI: This 35 y.o. year old female was admitted on 01/05/2016 for vomiting and throat swelling after eating and [place] sub. She relates a history of recurrent allergic reactions over the past year for which she has been given prednisone to take on an as-needed basis. No specific pattern or trigger has been identified, although one episode happened after she protect her finger on some type of weed last year. I don't have the results of her allergy testing, but apparently her primary care noted a mild allergy to peanuts and shellfish, though he did not recommend particular avoidance of these foods or an EpiPen. She denies any fever. She does have some sore throat since this episode happened yesterday. She has been given steroids here at the hospital and feels better. She can swallow now and denies any difficulty breathing. She wants to go home.  Allergies: No Known Allergies  Medications:  Medications Prior to Admission  Medication Sig Dispense Refill  . meloxicam (MOBIC) 7.5 MG tablet Take 7.5 mg by mouth 2 (two) times daily.  5  . predniSONE (DELTASONE) 10 MG tablet Take 10 mg by mouth 2 (two) times daily as needed.  2  . hydrOXYzine (ATARAX/VISTARIL) 25 MG tablet Take 1 tablet (25 mg total) by mouth 3 (three) times daily as needed. 12 tablet 0  . hydrOXYzine (VISTARIL) 25 MG capsule Take 1 capsule (25 mg total) by mouth 3 (three) times daily as needed. 30 capsule 0  . oxyCODONE-acetaminophen (ROXICET) 5-325 MG tablet Take 1 tablet by mouth every 4 (four) hours as needed for severe pain. 15 tablet 0  . predniSONE (DELTASONE) 20 MG tablet 3 tablets daily 4 days 12 tablet 0  . ranitidine (ZANTAC) 150 MG tablet Take 1 tablet (150 mg total) by mouth  at bedtime. 30 tablet 1  . ranitidine (ZANTAC) 150 MG tablet Take 1 tablet (150 mg total) by mouth at bedtime. 30 tablet 0  . traMADol (ULTRAM) 50 MG tablet Take 1 tablet (50 mg total) by mouth 2 (two) times daily. (Patient not taking: Reported on 01/06/2016) 10 tablet 0  .  Current Facility-Administered Medications  Medication Dose Route Frequency Provider Last Rate Last Dose  . acetaminophen (TYLENOL) tablet 650 mg  650 mg Oral Q6H PRN Fritzi Mandes, MD   650 mg at 01/06/16 2774   Or  . acetaminophen (TYLENOL) suppository 650 mg  650 mg Rectal Q6H PRN Fritzi Mandes, MD      . clindamycin (CLEOCIN) IVPB 600 mg  600 mg Intravenous 3 times per day Lenis Noon, RPH   600 mg at 01/06/16 1118  . dexamethasone (DECADRON) injection 10 mg  10 mg Intravenous 4 times per day Fritzi Mandes, MD   10 mg at 01/06/16 1226  . enoxaparin (LOVENOX) injection 40 mg  40 mg Subcutaneous Q12H Fritzi Mandes, MD   40 mg at 01/06/16 0842  . famotidine (PEPCID) tablet 20 mg  20 mg Oral BID Fritzi Mandes, MD   20 mg at 01/06/16 0841  . loratadine (CLARITIN) tablet 10 mg  10 mg Oral Daily Fritzi Mandes, MD   10 mg at 01/06/16 0841  . morphine 2 MG/ML injection 2 mg  2 mg Intravenous Q3H PRN Lance Coon, MD   2 mg at 01/06/16  0526  . ondansetron (ZOFRAN) tablet 4 mg  4 mg Oral Q6H PRN Fritzi Mandes, MD       Or  . ondansetron (ZOFRAN) injection 4 mg  4 mg Intravenous Q6H PRN Fritzi Mandes, MD        PMH:  Past Medical History  Diagnosis Date  . No pertinent past medical history     Fam Hx: No family history on file.  Soc Hx:  Social History   Social History  . Marital Status: Married    Spouse Name: N/A  . Number of Children: N/A  . Years of Education: N/A   Occupational History  . Not on file.   Social History Main Topics  . Smoking status: Current Every Day Smoker -- 1.00 packs/day for 10 years    Types: Cigarettes  . Smokeless tobacco: Never Used  . Alcohol Use: No  . Drug Use: No  . Sexual Activity: Yes    Birth  Control/ Protection: IUD   Other Topics Concern  . Not on file   Social History Narrative    PSH:  Past Surgical History  Procedure Laterality Date  . Adnoidectomy    . Tonsillectomy and adenoidectomy    . Adenoidectomy    . Procedures since admission: No admission procedures for hospital encounter.  ROS: Review of systems normal other than 12 systems except per HPI.  PHYSICAL EXAM Vitals:  Filed Vitals:   01/06/16 0203 01/06/16 0739  BP: 152/80 152/85  Pulse: 95 94  Temp: 97.8 F (36.6 C) 98.2 F (36.8 C)  Resp: 20 18  . General: Well-developed, Well-nourished in no acute distress Mood: Mood and affect well adjusted, pleasant and cooperative. Orientation: Grossly alert and oriented. Vocal Quality: No hoarseness. Communicates verbally. head and Face: NCAT. No facial asymmetry. No visible skin lesions. No significant facial scars. No tenderness with sinus percussion. Facial strength normal and symmetric. Ears: External ears with normal landmarks, no lesions. No otoscope was available for further ear examination.  Hearing: Speech reception grossly normal. Nose: External nose normal with midline dorsum and no lesions or deformity. Nasal Cavity reveals essentially midline septum with normal inferior turbinates. No significant mucosal congestion or erythema. Nasal secretions are minimal and clear. No polyps seen on anterior rhinoscopy. Oral Cavity/ Oropharynx: Lips are normal with no lesions. Teeth no frank dental caries. Gingiva healthy with no lesions or gingivitis. Oropharynx including tongue, buccal mucosa, floor of mouth, hard and soft palate, uvula and posterior pharynx free of exudates, erythema or lesions with normal symmetry and hydration.  Indirect Laryngoscopy/Nasopharyngoscopy: Visualization of the larynx, hypopharynx and nasopharynx is not possible in this setting with routine examination. Neck: Supple and symmetric with no palpable masses, tenderness or crepitance. The  trachea is midline. Thyroid gland is soft, nontender and symmetric with no masses or enlargement. Parotid and submandibular glands are soft, nontender and symmetric, without masses. Lymphatic: Cervical lymph nodes are without palpable lymphadenopathy or tenderness. Respiratory: Normal respiratory effort without labored breathing. Cardiovascular: Carotid pulse shows regular rate and rhythm Neurologic: Cranial Nerves II through XII are grossly intact. Eyes: Gaze and Ocular Motility are grossly normal. PERRLA. No visible nystagmus.  MEDICAL DECISION MAKING: Data Review:  Results for orders placed or performed during the hospital encounter of 01/05/16 (from the past 48 hour(s))  Comprehensive metabolic panel     Status: Abnormal   Collection Time: 01/05/16 10:31 PM  Result Value Ref Range   Sodium 136 135 - 145 mmol/L   Potassium 4.0 3.5 -  5.1 mmol/L   Chloride 103 101 - 111 mmol/L   CO2 27 22 - 32 mmol/L   Glucose, Bld 128 (H) 65 - 99 mg/dL   BUN 16 6 - 20 mg/dL   Creatinine, Ser 0.81 0.44 - 1.00 mg/dL   Calcium 9.2 8.9 - 10.3 mg/dL   Total Protein 7.2 6.5 - 8.1 g/dL   Albumin 3.9 3.5 - 5.0 g/dL   AST 22 15 - 41 U/L   ALT 24 14 - 54 U/L   Alkaline Phosphatase 103 38 - 126 U/L   Total Bilirubin 0.3 0.3 - 1.2 mg/dL   GFR calc non Af Amer >60 >60 mL/min   GFR calc Af Amer >60 >60 mL/min    Comment: (NOTE) The eGFR has been calculated using the CKD EPI equation. This calculation has not been validated in all clinical situations. eGFR's persistently <60 mL/min signify possible Chronic Kidney Disease.    Anion gap 6 5 - 15  CBC     Status: Abnormal   Collection Time: 01/05/16 10:31 PM  Result Value Ref Range   WBC 14.0 (H) 3.6 - 11.0 K/uL   RBC 4.98 3.80 - 5.20 MIL/uL   Hemoglobin 12.3 12.0 - 16.0 g/dL   HCT 38.4 35.0 - 47.0 %   MCV 77.2 (L) 80.0 - 100.0 fL   MCH 24.8 (L) 26.0 - 34.0 pg   MCHC 32.1 32.0 - 36.0 g/dL   RDW 16.1 (H) 11.5 - 14.5 %   Platelets 373 150 - 440 K/uL   Urinalysis complete, with microscopic ([facility] only)     Status: Abnormal   Collection Time: 01/05/16 10:50 PM  Result Value Ref Range   Color, Urine YELLOW (A) YELLOW   APPearance HAZY (A) CLEAR   Glucose, UA NEGATIVE NEGATIVE mg/dL   Bilirubin Urine NEGATIVE NEGATIVE   Ketones, ur NEGATIVE NEGATIVE mg/dL   Specific Gravity, Urine 1.025 1.005 - 1.030   Hgb urine dipstick 1+ (A) NEGATIVE   pH 5.0 5.0 - 8.0   Protein, ur 30 (A) NEGATIVE mg/dL   Nitrite NEGATIVE NEGATIVE   Leukocytes, UA NEGATIVE NEGATIVE   RBC / HPF 0-5 0 - 5 RBC/hpf   WBC, UA 0-5 0 - 5 WBC/hpf   Bacteria, UA NONE SEEN NONE SEEN   Squamous Epithelial / LPF 6-30 (A) NONE SEEN   Mucous PRESENT   Pregnancy, urine POC     Status: None   Collection Time: 01/05/16 11:14 PM  Result Value Ref Range   Preg Test, Ur NEGATIVE NEGATIVE    Comment:        THE SENSITIVITY OF THIS METHODOLOGY IS >24 mIU/mL   CBC     Status: Abnormal   Collection Time: 01/06/16  2:23 AM  Result Value Ref Range   WBC 17.0 (H) 3.6 - 11.0 K/uL   RBC 5.01 3.80 - 5.20 MIL/uL   Hemoglobin 12.3 12.0 - 16.0 g/dL   HCT 38.8 35.0 - 47.0 %   MCV 77.4 (L) 80.0 - 100.0 fL   MCH 24.5 (L) 26.0 - 34.0 pg   MCHC 31.6 (L) 32.0 - 36.0 g/dL   RDW 15.6 (H) 11.5 - 14.5 %   Platelets 353 150 - 440 K/uL  Creatinine, serum     Status: None   Collection Time: 01/06/16  2:23 AM  Result Value Ref Range   Creatinine, Ser 0.69 0.44 - 1.00 mg/dL   GFR calc non Af Amer >60 >60 mL/min   GFR calc Af Amer >  60 >60 mL/min    Comment: (NOTE) The eGFR has been calculated using the CKD EPI equation. This calculation has not been validated in all clinical situations. eGFR's persistently <60 mL/min signify possible Chronic Kidney Disease.   . Ct Soft Tissue Neck W Contrast  01/06/2016  CLINICAL DATA:  Dyspnea with facial swelling.  Neck erythema. EXAM: CT NECK AND CHEST WITH CONTRAST TECHNIQUE: Multidetector CT imaging of the neck and chest was performed using the standard  protocol following the bolus administration of intravenous contrast. CONTRAST:  81m OMNIPAQUE IOHEXOL 300 MG/ML  SOLN COMPARISON:  None. FINDINGS: Pharynx and larynx: There is supraglottic submucosal edema with markedly thickened aryepiglottic folds, right more than left. No glottic or infraglottic involvement is noted. The epiglottis is not thickened. The airway remains patent. No prevertebral thickening or fluid. Lack of fat edema around the central compartment favors angioedema over supraglottitis, but the patient is noted to have leukocytosis. Salivary glands: Negative Thyroid: Negative Lymph nodes: Mild reactive appearing enlargement in the cervical chains Vascular: Major venous structures are patent. Limited intracranial: Negative Visualized orbits: Negative Mastoids and visualized paranasal sinuses: Partial opacification in the right more than left mastoid air cells with sclerosis, compatible with chronic mastoiditis. Skeleton: Negative MEDIASTINUM: Normal heart size. No pericardial effusion. No vascular abnormality. No adenopathy. LUNG WINDOWS: There is no edema, consolidation, effusion, or pneumothorax. UPPER ABDOMEN: No acute findings. OSSEOUS: No acute fracture.  No suspicious lytic or blastic lesions. These results were called by telephone at the time of interpretation on 01/06/2016 at 12:47 am to Dr. RMarjean Donna, who verbally acknowledged these results. IMPRESSION: 1. Supraglottic larynx edema, worse on the right. Angioedema or supraglottitis could have this appearance. 2. Negative chest. Electronically Signed   By: JMonte FantasiaM.D.   On: 01/06/2016 00:50   Ct Chest W Contrast  01/06/2016  CLINICAL DATA:  Dyspnea with facial swelling.  Neck erythema. EXAM: CT NECK AND CHEST WITH CONTRAST TECHNIQUE: Multidetector CT imaging of the neck and chest was performed using the standard protocol following the bolus administration of intravenous contrast. CONTRAST:  72mOMNIPAQUE IOHEXOL 300 MG/ML   SOLN COMPARISON:  None. FINDINGS: Pharynx and larynx: There is supraglottic submucosal edema with markedly thickened aryepiglottic folds, right more than left. No glottic or infraglottic involvement is noted. The epiglottis is not thickened. The airway remains patent. No prevertebral thickening or fluid. Lack of fat edema around the central compartment favors angioedema over supraglottitis, but the patient is noted to have leukocytosis. Salivary glands: Negative Thyroid: Negative Lymph nodes: Mild reactive appearing enlargement in the cervical chains Vascular: Major venous structures are patent. Limited intracranial: Negative Visualized orbits: Negative Mastoids and visualized paranasal sinuses: Partial opacification in the right more than left mastoid air cells with sclerosis, compatible with chronic mastoiditis. Skeleton: Negative MEDIASTINUM: Normal heart size. No pericardial effusion. No vascular abnormality. No adenopathy. LUNG WINDOWS: There is no edema, consolidation, effusion, or pneumothorax. UPPER ABDOMEN: No acute findings. OSSEOUS: No acute fracture.  No suspicious lytic or blastic lesions. These results were called by telephone at the time of interpretation on 01/06/2016 at 12:47 am to Dr. RAMarjean Donna who verbally acknowledged these results. IMPRESSION: 1. Supraglottic larynx edema, worse on the right. Angioedema or supraglottitis could have this appearance. 2. Negative chest. Electronically Signed   By: JoMonte Fantasia.D.   On: 01/06/2016 00:50  .   PROCEDURE: Procedure: Diagnostic Fiberoptic Nasolaryngoscopy Diagnosis: Laryngeal edema after allergic reaction Indications: Evaluate laryngeal airway Findings: There is continued edema  of the right area epiglottic fold with boggy mucosal tissue narrowing the airway of the supraglottic region by about 40%. This tissue moves out of the airway with breathing which explains the lack of stridor, and relative comfort of breathing that the patient is  experiencing currently. Description of Procedure: After discussing procedure and risks  (primarily nose bleed) with the patient, the nose was anesthetized with topical Lidocaine 4% and decongested with phenylephrine. A flexible fiberoptic scope was passed through the nasal cavity. The nasal cavity was inspected and the scope passed through the Nasopharynx to the region of the hypopharynx and larynx. The patient was instructed to phonate to assess vocal cord mobility. The tongue was extended to evaluate the tongue base completely. Valsalva was performed to insufflate the hypopharynx for improved examination. Findings are as noted above. The scope was withdrawn. The patient tolerated the procedure well.  ASSESSMENT: Supraglottic edema after allergic reaction. This appears to been triggered by food ingestion.  PLAN: Improvement in fairly severe allergic reaction of uncertain etiology. I would have to advise continued observation of this patient, given the degree of airway narrowing. I would recommend continued treatment with steroids, and discharge home on a steroid taper, starting with 60 mg daily for the first 3 days or so and continuing use of Benadryl. RAST testing could be considered to further evaluate possible allergens, mainly looking at foods. A hidden food RAST test would be a good start point, however a food diary would be useful to help identify further foods for testing. Clearly the patient also needs to have an EpiPen available as well.   Riley Nearing, MD 01/06/2016 12:33 PM

## 2016-01-06 NOTE — Consult Note (Signed)
  I have ordered blood to be drawn for a RAST test, screening for food allergies. We also faxed over a food diary form for the patient to fill out for the next 2 weeks, and she should return so we can select further food items to test. I can follow-up with her as an outpatient, though she will need a referral from her PCP.

## 2016-01-07 MED ORDER — EPINEPHRINE 0.3 MG/0.3ML IJ SOAJ: 0.3000 mg | Freq: Once | INTRAMUSCULAR | Status: DC

## 2016-01-07 MED ORDER — NICOTINE 21 MG/24HR TD PT24: 21.0000 mg | MEDICATED_PATCH | Freq: Every day | TRANSDERMAL | Status: DC

## 2016-01-07 MED ORDER — ACETAMINOPHEN 325 MG PO TABS: 650.0000 mg | ORAL_TABLET | Freq: Four times a day (QID) | ORAL | Status: DC | PRN

## 2016-01-07 MED ORDER — PREDNISONE 10 MG PO TABS: ORAL_TABLET | ORAL | Status: DC

## 2016-01-07 NOTE — Progress Notes (Signed)
Order to discharge patient to home today, discharge instructions given per MD order, home and new medications reviewed with patient, rx.slips given. Spouse to take patient home. Patient ambulated at time of discharge.

## 2016-01-07 NOTE — Progress Notes (Signed)
Spoke with Dr.Bennett- okay to discharge  patient if no issues overnight, MD informed patient had a good night per report received. Okay to discharge per Dr.Bennett.   Dr.Gouru informed per Dr.Bennett okay to discharge patient today.

## 2016-01-07 NOTE — Progress Notes (Signed)
Dr.Gouru notified of the patient's request for early discharge due to childcare issue.

## 2016-01-07 NOTE — Progress Notes (Signed)
Dr.Gouru here to see patient.

## 2016-01-07 NOTE — Discharge Summary (Signed)
[facility] [place] [facility]   PATIENT NAME: Holly Garner    MR#:  [id number]  DATE OF BIRTH:  08/31/1981  DATE OF ADMISSION:  01/05/2016 ADMITTING PHYSICIAN: Enedina FinnerSona Patel, MD  DATE OF DISCHARGE: 01/07/16 PRIMARY CARE PHYSICIAN: Evelene CroonNIEMEYER, MEINDERT, MD    ADMISSION DIAGNOSIS:  vomiting  DISCHARGE DIAGNOSIS:   Supraglottic edema from allergic reaction of unclear etiology SECONDARY DIAGNOSIS:   Past Medical History  Diagnosis Date  . No pertinent past medical history     HOSPITAL COURSE:   Holly Garner is a 35 y.o. female with no past medical history comes to the emergency room after she started experiencing significant throat pain and "something stuck in my throat"patient reports having violent bout of vomiting after she ate a sub-sandwich. She has eaten this sandwich in the past.   1. Sore throat with abnormal soft tissue CT neck suggestive of supraglottic submucosal edema with markedly thickened aryepiglottic fold suggestive of possible angioedema -Exact etiology to allergy not clear - Clinically improved with IV Decadron 10 mg every 6 hourly, famotidine, Claritin -Empiric antibiotic with IV clindamycin discontinued as recommended by ENT -Patient with no rebound in the past 24 hours ,  ENTrecommending to discharge patient with 60 mg of prednisone for the first 3 days followed by 10 mg tapering and to continue Benadryl RAST testing is done -Outpatient follow-up with ENT in 2 weeks  -EpiPen as needed and discharge home with dual pack of EpiPen -Patient is stable respiratory wise.   2. Morbid obesity-patient will be benefited with lifestyle modifications with diet and exercise   3. Bilateral conjunctival hemorrhage from gagging Will resolve spontaneously Follow-up with primary care physician   3. DVT prophylaxis subcutaneous Lovenox  DISCHARGE CONDITIONS:   Fair  CONSULTS OBTAINED:      PROCEDURES none  DRUG ALLERGIES:  No  Known Allergies  DISCHARGE MEDICATIONS:   Current Discharge Medication List    START taking these medications   Details  acetaminophen (TYLENOL) 325 MG tablet Take 2 tablets (650 mg total) by mouth every 6 (six) hours as needed for mild pain (or Fever >/= 101).    EPINEPHrine (EPIPEN 2-PAK) 0.3 mg/0.3 mL IJ SOAJ injection Inject 0.3 mLs (0.3 mg total) into the muscle once. Qty: 2 Device, Refills: 0    nicotine (NICODERM CQ - DOSED IN MG/24 HOURS) 21 mg/24hr patch Place 1 patch (21 mg total) onto the skin daily. Qty: 28 patch, Refills: 0      CONTINUE these medications which have CHANGED   Details  predniSONE (DELTASONE) 10 MG tablet 6 tablets once daily 3  days followed by 5 tablets orally for 2  Days  followed by  4 tablets orally for a day followed by 3 tablets orally for a day followed by  2 tablets orally for a day followed by  1 tablet orally for 2 days and stop Qty: 40 tablet, Refills: 0      CONTINUE these medications which have NOT CHANGED   Details  hydrOXYzine (ATARAX/VISTARIL) 25 MG tablet Take 1 tablet (25 mg total) by mouth 3 (three) times daily as needed. Qty: 12 tablet, Refills: 0    oxyCODONE-acetaminophen (ROXICET) 5-325 MG tablet Take 1 tablet by mouth every 4 (four) hours as needed for severe pain. Qty: 15 tablet, Refills: 0    !! ranitidine (ZANTAC) 150 MG tablet Take 1 tablet (150 mg total) by mouth at bedtime. Qty: 30 tablet, Refills: 1    !! ranitidine (ZANTAC) 150  MG tablet Take 1 tablet (150 mg total) by mouth at bedtime. Qty: 30 tablet, Refills: 0    traMADol (ULTRAM) 50 MG tablet Take 1 tablet (50 mg total) by mouth 2 (two) times daily. Qty: 10 tablet, Refills: 0     !! - Potential duplicate medications found. Please discuss with provider.    STOP taking these medications     meloxicam (MOBIC) 7.5 MG tablet      hydrOXYzine (VISTARIL) 25 MG capsule      famotidine (PEPCID) 20 MG tablet      loratadine (CLARITIN) 10 MG tablet           DISCHARGE INSTRUCTIONS:  Activity as tolerated Diet low-fat Follow-up with primary care physician in a week, PCP to provide referral to ENT Follow-up with ENT Dr. Willeen Cass in 2 weeks   DIET:  Low fat, Low cholesterol diet  DISCHARGE CONDITION:  Fair  ACTIVITY:  Activity as tolerated  OXYGEN:  Home Oxygen: No.   Oxygen Delivery: room air  DISCHARGE LOCATION:  home   If you experience worsening of your admission symptoms, develop shortness of breath, life threatening emergency, suicidal or homicidal thoughts you must seek medical attention immediately by calling 911 or calling your MD immediately  if symptoms less severe.  You Must read complete instructions/literature along with all the possible adverse reactions/side effects for all the Medicines you take and that have been prescribed to you. Take any new Medicines after you have completely understood and accpet all the possible adverse reactions/side effects.   Please note  You were cared for by a hospitalist during your hospital stay. If you have any questions about your discharge medications or the care you received while you were in the hospital after you are discharged, you can call the unit and asked to speak with the hospitalist on call if the hospitalist that took care of you is not available. Once you are discharged, your primary care physician will handle any further medical issues. Please note that NO REFILLS for any discharge medications will be authorized once you are discharged, as it is imperative that you return to your primary care physician (or establish a relationship with a primary care physician if you do not have one) for your aftercare needs so that they can reassess your need for medications and monitor your lab values.     Today  Chief Complaint  Patient presents with  . Emesis  . Hyperventilating   Pt is feeling fine ,no ON events.Denies any throat closing sensation or sob   ROS:   CONSTITUTIONAL: Denies fevers, chills. Denies any fatigue, weakness.  EYES: Denies blurry vision, double vision, eye pain. EARS, NOSE, THROAT: Denies tinnitus, ear pain, hearing loss. RESPIRATORY: Denies cough, wheeze, shortness of breath.  CARDIOVASCULAR: Denies chest pain, palpitations, edema.  GASTROINTESTINAL: Denies nausea, vomiting, diarrhea, abdominal pain. Denies bright red blood per rectum. GENITOURINARY: Denies dysuria, hematuria. ENDOCRINE: Denies nocturia or thyroid problems. HEMATOLOGIC AND LYMPHATIC: Denies easy bruising or bleeding. SKIN: Denies rash or lesion. MUSCULOSKELETAL: Denies pain in neck, back, shoulder, knees, hips or arthritic symptoms.  NEUROLOGIC: Denies paralysis, paresthesias.  PSYCHIATRIC: Denies anxiety or depressive symptoms.   VITAL SIGNS:  Blood pressure 145/75, pulse 92, temperature 97.9 F (36.6 C), temperature source Axillary, resp. rate 18, height 5\' 6"  (1.676 m), weight 142.566 kg (314 lb 4.8 oz), last menstrual period 01/05/2016, SpO2 99 %, unknown if currently breastfeeding.  I/O:    Intake/Output Summary (Last 24 hours) at 01/07/16 0929 Last  data filed at 01/07/16 0600  Gross per 24 hour  Intake    840 ml  Output    600 ml  Net    240 ml    PHYSICAL EXAMINATION:  GENERAL:  35 y.o.-year-old patient lying in the bed with no acute distress.  EYES: Pupils equal, round, reactive to light and accommodation. No scleral icterus. Extraocular muscles intact.  HEENT: Head atraumatic, normocephalic. Oropharynx and nasopharynx clear.  NECK:  Supple, no jugular venous distention. No thyroid enlargement, no tenderness.  LUNGS: Normal breath sounds bilaterally, no wheezing, rales,rhonchi or crepitation. No use of accessory muscles of respiration.  CARDIOVASCULAR: S1, S2 normal. No murmurs, rubs, or gallops.  ABDOMEN: Soft, non-tender, non-distended. Bowel sounds present. No organomegaly or mass.  EXTREMITIES: No pedal edema, cyanosis, or clubbing.   NEUROLOGIC: Cranial nerves II through XII are intact. Muscle strength 5/5 in all extremities. Sensation intact. Gait not checked.  PSYCHIATRIC: The patient is alert and oriented x 3.  SKIN: No obvious rash, lesion, or ulcer.   DATA REVIEW:   CBC  Recent Labs Lab 01/06/16 0223  WBC 17.0*  HGB 12.3  HCT 38.8  PLT 353    Chemistries   Recent Labs Lab 01/05/16 2231 01/06/16 0223  NA 136  --   K 4.0  --   CL 103  --   CO2 27  --   GLUCOSE 128*  --   BUN 16  --   CREATININE 0.81 0.69  CALCIUM 9.2  --   AST 22  --   ALT 24  --   ALKPHOS 103  --   BILITOT 0.3  --     Cardiac Enzymes No results for input(s): TROPONINI in the last 168 hours.  Microbiology Results  Results for orders placed or performed in visit on 07/03/12  Wound culture     Status: None   Collection Time: 07/03/12  2:25 PM  Result Value Ref Range Status   Micro Text Report   Final       SOURCE: BACK ABSCESS    ORGANISM 1                HEAVY GROWTH METHICILLIN RESISTANT STAPH.AUREUS   COMMENT                   -   GRAM STAIN                MANY WHITE BLOOD CELLS   GRAM STAIN                MANY GRAM POSITIVE COCCI   ANTIBIOTIC                    ORG#1     CIPROFLOXACIN                 R         CLINDAMYCIN                   S         ERYTHROMYCIN                  R         GENTAMICIN                    S         LEVOFLOXACIN  I         LINEZOLID                     S         OXACILLIN                     R         TIGECYCLINE                   S         VANCOMYCIN                    S         CEFOXITIN SCREEN              POSITIVE  INDUCIBLE CLINDAMYCIN RESISTANNEGATIVE  TRIMETHOPRIM/SULFAMETHOXAZOLE S             RADIOLOGY:  Ct Soft Tissue Neck W Contrast  01/06/2016  CLINICAL DATA:  Dyspnea with facial swelling.  Neck erythema. EXAM: CT NECK AND CHEST WITH CONTRAST TECHNIQUE: Multidetector CT imaging of the neck and chest was performed using the standard protocol  following the bolus administration of intravenous contrast. CONTRAST:  75mL OMNIPAQUE IOHEXOL 300 MG/ML  SOLN COMPARISON:  None. FINDINGS: Pharynx and larynx: There is supraglottic submucosal edema with markedly thickened aryepiglottic folds, right more than left. No glottic or infraglottic involvement is noted. The epiglottis is not thickened. The airway remains patent. No prevertebral thickening or fluid. Lack of fat edema around the central compartment favors angioedema over supraglottitis, but the patient is noted to have leukocytosis. Salivary glands: Negative Thyroid: Negative Lymph nodes: Mild reactive appearing enlargement in the cervical chains Vascular: Major venous structures are patent. Limited intracranial: Negative Visualized orbits: Negative Mastoids and visualized paranasal sinuses: Partial opacification in the right more than left mastoid air cells with sclerosis, compatible with chronic mastoiditis. Skeleton: Negative MEDIASTINUM: Normal heart size. No pericardial effusion. No vascular abnormality. No adenopathy. LUNG WINDOWS: There is no edema, consolidation, effusion, or pneumothorax. UPPER ABDOMEN: No acute findings. OSSEOUS: No acute fracture.  No suspicious lytic or blastic lesions. These results were called by telephone at the time of interpretation on 01/06/2016 at 12:47 am to Dr. Bayard Males , who verbally acknowledged these results. IMPRESSION: 1. Supraglottic larynx edema, worse on the right. Angioedema or supraglottitis could have this appearance. 2. Negative chest. Electronically Signed   By: Marnee Spring M.D.   On: 01/06/2016 00:50   Ct Chest W Contrast  01/06/2016  CLINICAL DATA:  Dyspnea with facial swelling.  Neck erythema. EXAM: CT NECK AND CHEST WITH CONTRAST TECHNIQUE: Multidetector CT imaging of the neck and chest was performed using the standard protocol following the bolus administration of intravenous contrast. CONTRAST:  75mL OMNIPAQUE IOHEXOL 300 MG/ML  SOLN  COMPARISON:  None. FINDINGS: Pharynx and larynx: There is supraglottic submucosal edema with markedly thickened aryepiglottic folds, right more than left. No glottic or infraglottic involvement is noted. The epiglottis is not thickened. The airway remains patent. No prevertebral thickening or fluid. Lack of fat edema around the central compartment favors angioedema over supraglottitis, but the patient is noted to have leukocytosis. Salivary glands: Negative Thyroid: Negative Lymph nodes: Mild reactive appearing enlargement in the cervical chains Vascular: Major venous structures are patent. Limited intracranial: Negative Visualized orbits: Negative Mastoids and visualized paranasal sinuses: Partial opacification in the right more than left mastoid air cells with sclerosis, compatible with chronic mastoiditis.  Skeleton: Negative MEDIASTINUM: Normal heart size. No pericardial effusion. No vascular abnormality. No adenopathy. LUNG WINDOWS: There is no edema, consolidation, effusion, or pneumothorax. UPPER ABDOMEN: No acute findings. OSSEOUS: No acute fracture.  No suspicious lytic or blastic lesions. These results were called by telephone at the time of interpretation on 01/06/2016 at 12:47 am to Dr. Bayard Males , who verbally acknowledged these results. IMPRESSION: 1. Supraglottic larynx edema, worse on the right. Angioedema or supraglottitis could have this appearance. 2. Negative chest. Electronically Signed   By: Marnee Spring M.D.   On: 01/06/2016 00:50    EKG:   Orders placed or performed in visit on 09/04/09  . EKG 12-Lead      Management plans discussed with the patient, family and they are in agreement.  CODE STATUS:     Code Status Orders        Start     Ordered   01/06/16 0203  Full code   Continuous     01/06/16 0202    Code Status History    Date Active Date Inactive Code Status Order ID Comments User Context   This patient has a current code status but no historical code  status.      TOTAL TIME TAKING CARE OF THIS PATIENT: 45  minutes.    @  on 01/07/2016 at 9:29 AM  Between 7am to 6pm - Pager - [phone]  After 6pm go to [email or web address] - password [email or web address] [facility]  [place]  Office  [phone]  CC: Primary care physician; Evelene Croon, MD

## 2016-01-07 NOTE — Discharge Instructions (Signed)
Activity as tolerated Diet low-fat Follow-up with primary care physician in a week, PCP to provide referral to ENT Follow-up with ENT Dr. Willeen CassBennett in 2 weeks

## 2016-01-10 LAB — ALLERGENS(10) FOODS
Allergen Corn, IgE: 9.02 kU/L — AB
F045-IgE Yeast: 0.17 kU/L — AB
F081-IgE Cheese, Cheddar Type: 0.22 kU/L — AB
F082-IgE Cheese, Mold Type: 0.26 kU/L — AB
F245-Ige Egg, Whole: 0.52 kU/L — AB
MILK IGE: 0.17 kU/L — AB
Malt: 14.5 kU/L — AB
PEANUT IGE: 13.9 kU/L — AB
SOYBEAN IGE: 11.2 kU/L — AB
WHEAT IGE: 12.9 kU/L — AB

## 2016-07-04 ENCOUNTER — Encounter: Payer: Self-pay | Admitting: Obstetrics and Gynecology

## 2016-07-04 ENCOUNTER — Encounter: Payer: Medicaid Other | Admitting: Obstetrics and Gynecology

## 2016-07-04 NOTE — Progress Notes (Signed)
Patient did not show to 07/04/2016 GYN visit  Ryah Cribb, Jr MD Attending [facility] (Faculty Practice)  

## 2016-09-24 ENCOUNTER — Encounter: Payer: Self-pay | Admitting: Medical Oncology

## 2016-09-24 ENCOUNTER — Emergency Department
Admission: EM | Admit: 2016-09-24 | Discharge: 2016-09-24 | Disposition: A | Discharge status: Home / Self Care | Payer: Medicaid Other | Attending: Emergency Medicine | Admitting: Emergency Medicine

## 2016-09-24 DIAGNOSIS — L258 Unspecified contact dermatitis due to other agents: Secondary | ICD-10-CM

## 2016-09-24 DIAGNOSIS — Z79899 Other long term (current) drug therapy: Secondary | ICD-10-CM | POA: Insufficient documentation

## 2016-09-24 DIAGNOSIS — F17210 Nicotine dependence, cigarettes, uncomplicated: Secondary | ICD-10-CM | POA: Insufficient documentation

## 2016-09-24 MED ORDER — PREDNISONE 10 MG PO TABS: 10.0000 mg | ORAL_TABLET | Freq: Every day | ORAL | 0 refills | Status: DC

## 2016-09-24 MED ORDER — CIPROFLOXACIN-DEXAMETHASONE 0.3-0.1 % OT SUSP: 4.0000 [drp] | Freq: Two times a day (BID) | OTIC | 0 refills | Status: DC

## 2016-09-24 MED ORDER — HYDROXYZINE HCL 50 MG PO TABS
50.0000 mg | ORAL_TABLET | Freq: Once | ORAL | Status: AC
  Administered 2016-09-24: 50 mg via ORAL
  Filled 2016-09-24: qty 1

## 2016-09-24 MED ORDER — METHYLPREDNISOLONE SODIUM SUCC 125 MG IJ SOLR
80.0000 mg | Freq: Once | INTRAMUSCULAR | Status: AC
  Administered 2016-09-24: 80 mg via INTRAVENOUS
  Filled 2016-09-24: qty 2

## 2016-09-24 MED ORDER — HYDROXYZINE HCL 50 MG PO TABS: 50.0000 mg | ORAL_TABLET | Freq: Three times a day (TID) | ORAL | 0 refills | Status: DC | PRN

## 2016-09-24 NOTE — ED Triage Notes (Signed)
Pt reports that she has been having issues with the back of her neck swelling for about 1.5 year. Pt reports pain to area.

## 2016-09-24 NOTE — ED Provider Notes (Signed)
[facility] Emergency Department Provider Note   ____________________________________________   First MD Initiated Contact with Patient 09/24/16 [phone]     (approximate)  I have reviewed the triage vital signs and the nursing notes.   HISTORY  Chief Complaint Neck Pain    HPI Holly Garner is a 35 y.o. female patient. Presents with edema and erythema posterior neck. Patient believed she has been exposed to chemicals or plants from her husband's job. Patient states ongoing condition intermitting flareups which responded well to steroids and antihistamines. Patient is being followed by her family doctor ENT and allergist to determine the cause for this condition. Patient state his episode occurred last night. Patient stated there is moderate itching with this complaint. Patient is on steroids and antihistamine that she normally takes when it flares up. Patient denies any pain with this complaint. No palliative measures for this episode.   Past Medical History:  Diagnosis Date  . No pertinent past medical history     Patient Active Problem List   Diagnosis Date Noted  . Throat pain in adult 01/06/2016    Past Surgical History:  Procedure Laterality Date  . ADENOIDECTOMY    . adnoidectomy    . TONSILLECTOMY AND ADENOIDECTOMY      Prior to Admission medications   Medication Sig Start Date End Date Taking? Authorizing Provider  acetaminophen (TYLENOL) 325 MG tablet Take 2 tablets (650 mg total) by mouth every 6 (six) hours as needed for mild pain (or Fever >/= 101). 01/07/16   Ramonita LabAruna Gouru, MD  ciprofloxacin-dexamethasone (CIPRODEX) otic suspension Place 4 drops into the left ear 2 (two) times daily. 09/24/16   Joni Reiningonald K Drena Ham, PA-C  EPINEPHrine (EPIPEN 2-PAK) 0.3 mg/0.3 mL IJ SOAJ injection Inject 0.3 mLs (0.3 mg total) into the muscle once. 01/07/16   Ramonita LabAruna Gouru, MD  hydrOXYzine (ATARAX/VISTARIL) 25 MG tablet Take 1 tablet (25 mg total) by mouth 3  (three) times daily as needed. 02/21/15   III William C Ruffian, PA-C  hydrOXYzine (ATARAX/VISTARIL) 50 MG tablet Take 1 tablet (50 mg total) by mouth 3 (three) times daily as needed. 09/24/16   Joni Reiningonald K Nansi Birmingham, PA-C  nicotine (NICODERM CQ - DOSED IN MG/24 HOURS) 21 mg/24hr patch Place 1 patch (21 mg total) onto the skin daily. 01/07/16   Ramonita LabAruna Gouru, MD  oxyCODONE-acetaminophen (ROXICET) 5-325 MG tablet Take 1 tablet by mouth every 4 (four) hours as needed for severe pain. 09/05/15   Irean HongJade J Sung, MD  predniSONE (DELTASONE) 10 MG tablet 6 tablets once daily 3  days followed by 5 tablets orally for 2  Days  followed by  4 tablets orally for a day followed by 3 tablets orally for a day followed by  2 tablets orally for a day followed by  1 tablet orally for 2 days and stop 01/07/16   Ramonita LabAruna Gouru, MD  predniSONE (DELTASONE) 10 MG tablet Take 1 tablet (10 mg total) by mouth daily. 09/24/16   Joni Reiningonald K Melven Stockard, PA-C  ranitidine (ZANTAC) 150 MG tablet Take 1 tablet (150 mg total) by mouth at bedtime. 02/21/15 02/21/16  III William C Ruffian, PA-C  ranitidine (ZANTAC) 150 MG tablet Take 1 tablet (150 mg total) by mouth at bedtime. 04/05/15 04/04/16  III William C Ruffian, PA-C  traMADol (ULTRAM) 50 MG tablet Take 1 tablet (50 mg total) by mouth 2 (two) times daily. Patient not taking: Reported on 01/06/2016 04/26/15   Charlesetta IvoryJenise V Bacon Menshew, PA-C    Allergies  Patient has no known allergies.  No family history on file.  Social History Social History  Substance Use Topics  . Smoking status: Current Every Day Smoker    Packs/day: 1.00    Years: 10.00    Types: Cigarettes  . Smokeless tobacco: Never Used  . Alcohol use No    Review of Systems Constitutional: No fever/chills Eyes: No visual changes. ENT: No sore throat. Cardiovascular: Denies chest pain. Respiratory: Denies shortness of breath. Gastrointestinal: No abdominal pain.  No nausea, no vomiting.  No diarrhea.  No constipation. Genitourinary:  Negative for dysuria. Musculoskeletal: Negative for back pain. Skin: Positivefor rash. Neurological: Negative for headaches, focal weakness or numbness.    ____________________________________________   PHYSICAL EXAM:  VITAL SIGNS: ED Triage Vitals [09/24/16 0743]  Enc Vitals Group     BP      Pulse      Resp      Temp      Temp src      SpO2      Weight      Height      Head Circumference      Peak Flow      Pain Score 5     Pain Loc      Pain Edu?      Excl. in GC?     Constitutional: Alert and oriented. Well appearing and in no acute distress. Eyes: Conjunctivae are normal. PERRL. EOMI. Head: Atraumatic. Nose: No congestion/rhinnorhea. Mouth/Throat: Mucous membranes are moist.  Oropharynx non-erythematous. Neck: No stridor.  No cervical spine tenderness to palpation. Hematological/Lymphatic/Immunilogical: No cervical lymphadenopathy. Cardiovascular: Normal rate, regular rhythm. Grossly normal heart sounds.  Good peripheral circulation. Respiratory: Normal respiratory effort.  No retractions. Lungs CTAB. Gastrointestinal: Soft and nontender. No distention. No abdominal bruits. No CVA tenderness. Musculoskeletal: No lower extremity tenderness nor edema.  No joint effusions. Neurologic:  Normal speech and language. No gross focal neurologic deficits are appreciated. No gait instability. Skin:  Skin is warm, dry and intact. Edema and erythema posterior neck. Psychiatric: Mood and affect are normal. Speech and behavior are normal.  ____________________________________________   LABS (all labs ordered are listed, but only abnormal results are displayed)  Labs Reviewed - No data to display ____________________________________________  EKG   ____________________________________________  RADIOLOGY   ____________________________________________   PROCEDURES  Procedure(s) performed: None  Procedures  Critical Care performed:  No  ____________________________________________   INITIAL IMPRESSION / ASSESSMENT AND PLAN / ED COURSE  Pertinent labs & imaging results that were available during my care of the patient were reviewed by me and considered in my medical decision making (see chart for details).  Contact dermatitis. Patient given discharge care instructions. Patient advised to continue follow for allergies. Patient given a prescription for Atarax and prednisone.  Clinical Course      ____________________________________________   FINAL CLINICAL IMPRESSION(S) / ED DIAGNOSES  Final diagnoses:  Contact dermatitis due to other agent, unspecified contact dermatitis type      NEW MEDICATIONS STARTED DURING THIS VISIT:  New Prescriptions   CIPROFLOXACIN-DEXAMETHASONE (CIPRODEX) OTIC SUSPENSION    Place 4 drops into the left ear 2 (two) times daily.   HYDROXYZINE (ATARAX/VISTARIL) 50 MG TABLET    Take 1 tablet (50 mg total) by mouth 3 (three) times daily as needed.   PREDNISONE (DELTASONE) 10 MG TABLET    Take 1 tablet (10 mg total) by mouth daily.     Note:  This document was prepared using Conservation officer, historic buildings and  may include unintentional dictation errors.    Joni Reiningonald K Abbrielle Batts, PA-C 09/24/16 [id number]    Rockne MenghiniAnne-Caroline Norman, MD 09/24/16 [phone]

## 2016-09-24 NOTE — ED Notes (Addendum)
See triage note.states she has intermittent episodes of swelling for the past 1 1/2 years   Post being stuck when pulling weeds   Has been on prednisone off and on since

## 2016-11-18 ENCOUNTER — Encounter: Payer: Self-pay | Admitting: Emergency Medicine

## 2016-11-18 ENCOUNTER — Emergency Department
Admission: EM | Admit: 2016-11-18 | Discharge: 2016-11-18 | Disposition: A | Discharge status: Home / Self Care | Payer: Medicaid Other | Attending: Emergency Medicine | Admitting: Emergency Medicine

## 2016-11-18 DIAGNOSIS — L259 Unspecified contact dermatitis, unspecified cause: Secondary | ICD-10-CM | POA: Insufficient documentation

## 2016-11-18 DIAGNOSIS — Z79899 Other long term (current) drug therapy: Secondary | ICD-10-CM | POA: Insufficient documentation

## 2016-11-18 DIAGNOSIS — F17210 Nicotine dependence, cigarettes, uncomplicated: Secondary | ICD-10-CM | POA: Insufficient documentation

## 2016-11-18 MED ORDER — METHYLPREDNISOLONE SODIUM SUCC 125 MG IJ SOLR
125.0000 mg | Freq: Once | INTRAMUSCULAR | Status: AC
  Administered 2016-11-18: 125 mg via INTRAMUSCULAR
  Filled 2016-11-18: qty 2

## 2016-11-18 MED ORDER — PREDNISONE 10 MG PO TABS
10.0000 mg | ORAL_TABLET | Freq: Every day | ORAL | 0 refills | Status: AC
Start: 2016-11-18 — End: 2016-11-23

## 2016-11-18 MED ORDER — CETIRIZINE HCL 5 MG/5ML PO SYRP
5.0000 mg | ORAL_SOLUTION | Freq: Once | ORAL | Status: AC
  Administered 2016-11-18: 5 mg via ORAL
  Filled 2016-11-18 (×2): qty 5

## 2016-11-18 NOTE — ED Triage Notes (Signed)
States developed some swelling noted to back of neck  Hx of same    Large red area noted to back of neck

## 2016-11-18 NOTE — ED Notes (Signed)
AAOx3.  Skin warm and dry.  NAD 

## 2016-11-18 NOTE — ED Notes (Signed)
First nurse note   States she developed some swelling to back of neck

## 2016-11-18 NOTE — ED Provider Notes (Signed)
[facility] Emergency Department Provider Note  ____________________________________________  Time seen: Approximately 11:50 AM  I have reviewed the triage vital signs and the nursing notes.   HISTORY  Chief Complaint Lymphadenopathy    HPI Holly Garner is a 36 y.o. female presenting with a 5 cm x 5 cm erythematous rash localized to the skin overlying the posterior neck. Patient states that she has had a rash intermittently over the past 2 years, which responds to prednisone and antihistamines. Patient first noticed the rash occurring two years ago when she was working with "[place] thistle" as her husband is a Administrator. Rash has occurred intermittently in various body regions every few months since this time. She has been worked up for both rheumatoid arthritis and lupus. Patient describes rash as pruritic. She denies fever, shortness of breath, facial swelling, chest tightness, chest pain, nausea and abdominal pain. Patient states that she cannot seek follow-up with primary care at this time due to limitations with insurance coverage. She has been afebrile. She states that rash is maintained with 10 mg of prednisone as needed.   Past Medical History:  Diagnosis Date  . No pertinent past medical history     Patient Active Problem List   Diagnosis Date Noted  . Throat pain in adult 01/06/2016    Past Surgical History:  Procedure Laterality Date  . ADENOIDECTOMY    . adnoidectomy    . TONSILLECTOMY AND ADENOIDECTOMY      Prior to Admission medications   Medication Sig Start Date End Date Taking? Authorizing Provider  acetaminophen (TYLENOL) 325 MG tablet Take 2 tablets (650 mg total) by mouth every 6 (six) hours as needed for mild pain (or Fever >/= 101). 01/07/16   Ramonita Lab, MD  ciprofloxacin-dexamethasone (CIPRODEX) otic suspension Place 4 drops into the left ear 2 (two) times daily. 09/24/16   Joni Reining, PA-C  EPINEPHrine (EPIPEN 2-PAK) 0.3  mg/0.3 mL IJ SOAJ injection Inject 0.3 mLs (0.3 mg total) into the muscle once. 01/07/16   Ramonita Lab, MD  hydrOXYzine (ATARAX/VISTARIL) 25 MG tablet Take 1 tablet (25 mg total) by mouth 3 (three) times daily as needed. 02/21/15   III William C Ruffian, PA-C  hydrOXYzine (ATARAX/VISTARIL) 50 MG tablet Take 1 tablet (50 mg total) by mouth 3 (three) times daily as needed. 09/24/16   Joni Reining, PA-C  nicotine (NICODERM CQ - DOSED IN MG/24 HOURS) 21 mg/24hr patch Place 1 patch (21 mg total) onto the skin daily. 01/07/16   Ramonita Lab, MD  oxyCODONE-acetaminophen (ROXICET) 5-325 MG tablet Take 1 tablet by mouth every 4 (four) hours as needed for severe pain. 09/05/15   Irean Hong, MD  predniSONE (DELTASONE) 10 MG tablet Take 1 tablet (10 mg total) by mouth daily. Take one daily for the next five days. 11/18/16 11/23/16  Orvil Feil, PA-C  ranitidine (ZANTAC) 150 MG tablet Take 1 tablet (150 mg total) by mouth at bedtime. 02/21/15 02/21/16  III William C Ruffian, PA-C  ranitidine (ZANTAC) 150 MG tablet Take 1 tablet (150 mg total) by mouth at bedtime. 04/05/15 04/04/16  III William C Ruffian, PA-C  traMADol (ULTRAM) 50 MG tablet Take 1 tablet (50 mg total) by mouth 2 (two) times daily. Patient not taking: Reported on 01/06/2016 04/26/15   Charlesetta Ivory Menshew, PA-C    Allergies Patient has no known allergies.  No family history on file.  Social History Social History  Substance Use Topics  . Smoking status:  Current Every Day Smoker    Packs/day: 1.00    Years: 10.00    Types: Cigarettes  . Smokeless tobacco: Never Used  . Alcohol use No     Review of Systems  Constitutional: No fever/chills Eyes: No visual changes. No discharge ENT: No upper respiratory complaints. Cardiovascular: no chest pain. Respiratory: no cough. No SOB. Gastrointestinal: No abdominal pain.  No nausea, no vomiting.  No diarrhea.  No constipation. Musculoskeletal: Negative for musculoskeletal pain. Skin: Patient has  posterior neck rash.  Neurological: Negative for headaches, focal weakness or numbness.   ____________________________________________   PHYSICAL EXAM:  VITAL SIGNS: ED Triage Vitals  Enc Vitals Group     BP 11/18/16 1032 (!) 166/98     Pulse Rate 11/18/16 1032 100     Resp 11/18/16 1032 20     Temp 11/18/16 1032 98.1 F (36.7 C)     Temp Source 11/18/16 1032 Oral     SpO2 11/18/16 1032 100 %     Weight 11/18/16 1030 (!) 310 lb (140.6 kg)     Height 11/18/16 1030 5\' 6"  (1.676 m)     Head Circumference --      Peak Flow --      Pain Score --      Pain Loc --      Pain Edu? --      Excl. in GC? --      Constitutional: Alert and oriented. Patient is talkative and engaged.  Eyes: Palpebral and bulbar conjunctiva are nonerythematous bilaterally. PERRL. EOMI. No scleral icterus bilaterally. Head: Atraumatic. ENT:      Ears: Tympanic membranes are pearly bilaterally without effusion, erythema or purulent exudate. Bony landmarks are visualized bilaterally.       Nose: Skin overlying nares is without erythema. Nasal turbinates are non-erythematous. Nasal septum is midline.      Mouth/Throat: Mucous membranes are moist. Posterior pharynx is nonerythematous. No tonsillar exudate, hypertrophy or petechiae visualized. Uvula is midline. Neck: Full range of motion. No pain with neck flexion. Patient has palpable supraclavicular fat pad. Hematological/Lymphatic/Immunilogical: No cervical lymphadenopathy.  Cardiovascular: No pain with palpation over the anterior and posterior chest wall. Normal rate, regular rhythm. Normal S1 and S2. No murmurs, gallops or rubs auscultated.  Respiratory: Trachea is midline. No retractions or presence of deformity. Thoracic expansion is symmetric with unaccentuated tactile fremitus. Resonant and symmetric percussion tones bilaterally. On auscultation, adventitious sounds are absent.  Gastrointestinal: No areas of visible pulsations or peristalsis. Active bowel  sounds audible in all four quadrants. No friction rubs over liver or spleen auscultated. Percussion tones tympanic over epigastrium and resonant over remainder of abdomen. On inspiration, liver edge is firm, smooth and non-tender. No splenomegaly. Musculature soft and relaxed to light palpation. No masses or areas of tenderness to deep palpation. No costovertebral angle tenderness bilaterally.  Neurologic:  Normal for age. No gross focal neurologic deficits are appreciated.  Skin:  Skin overlying posterior neck has a 5cm x 5cm  region of erythema without vesicular formation. Excoriation of the skin overlying the posterior neck visualized. Psychiatric: Mood and affect are normal for age. Speech and behavior are normal.    ____________________________________________   LABS (all labs ordered are listed, but only abnormal results are displayed)  Labs Reviewed - No data to display ____________________________________________  EKG   ____________________________________________  RADIOLOGY  No results found.  ____________________________________________    PROCEDURES  Procedure(s) performed:    Procedures    Medications  methylPREDNISolone sodium succinate (SOLU-MEDROL)  125 mg/2 mL injection 125 mg (125 mg Intramuscular Given 11/18/16 1207)  cetirizine HCl (Zyrtec) 5 MG/5ML syrup 5 mg (5 mg Oral Given 11/18/16 1214)     ____________________________________________   INITIAL IMPRESSION / ASSESSMENT AND PLAN / ED COURSE  Pertinent labs & imaging results that were available during my care of the patient were reviewed by me and considered in my medical decision making (see chart for details).  Review of the [place] CSRS was performed in accordance of the [facility] prior to dispensing any controlled drugs.    Assessment and plan:  Contact Dermatitis:  Patient presents to the emergency department with intermittent rash that she has experienced for the last 2 years. Physical exam and  vital signs are reassuring at this time. Patient was given Solu-Medrol and discharged with prednisone. Patient education was provided regarding the possible adverse effects of chronic corticosteroid use. Patient was advised to follow-up with her primary care provider for appropriate chronic management of her symptoms.  ____________________________________________  FINAL CLINICAL IMPRESSION(S) / ED DIAGNOSES  Final diagnoses:  Contact dermatitis, unspecified contact dermatitis type, unspecified trigger      NEW MEDICATIONS STARTED DURING THIS VISIT:  Discharge Medication List as of 11/18/2016 11:59 AM          This chart was dictated using voice recognition software/Dragon. Despite best efforts to proofread, errors can occur which can change the meaning. Any change was purely unintentional.    Orvil Feil, PA-C 11/18/16 1627    Jene Every, MD 11/19/16 [phone]

## 2017-02-06 ENCOUNTER — Ambulatory Visit (INDEPENDENT_AMBULATORY_CARE_PROVIDER_SITE_OTHER): Payer: Medicaid Other | Admitting: Obstetrics and Gynecology

## 2017-02-06 ENCOUNTER — Encounter: Payer: Self-pay | Admitting: Obstetrics and Gynecology

## 2017-02-06 VITALS — BP 128/88 | Ht 65.0 in | Wt 338.0 lb

## 2017-02-06 DIAGNOSIS — Z01419 Encounter for gynecological examination (general) (routine) without abnormal findings: Secondary | ICD-10-CM | POA: Diagnosis not present

## 2017-02-06 DIAGNOSIS — Z1151 Encounter for screening for human papillomavirus (HPV): Secondary | ICD-10-CM | POA: Diagnosis not present

## 2017-02-06 DIAGNOSIS — Z124 Encounter for screening for malignant neoplasm of cervix: Secondary | ICD-10-CM | POA: Diagnosis not present

## 2017-02-06 DIAGNOSIS — Z0000 Encounter for general adult medical examination without abnormal findings: Secondary | ICD-10-CM | POA: Diagnosis not present

## 2017-02-06 DIAGNOSIS — Z308 Encounter for other contraceptive management: Secondary | ICD-10-CM

## 2017-02-06 DIAGNOSIS — Z30431 Encounter for routine checking of intrauterine contraceptive device: Secondary | ICD-10-CM

## 2017-02-06 DIAGNOSIS — Z713 Dietary counseling and surveillance: Secondary | ICD-10-CM
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
## 2017-02-09 LAB — IGP, APTIMA HPV
HPV Aptima: POSITIVE — AB
PAP Smear Comment: 0

## 2017-03-07 ENCOUNTER — Ambulatory Visit: Payer: Medicaid Other | Admitting: Obstetrics and Gynecology

## 2017-10-04 ENCOUNTER — Emergency Department
Admission: EM | Admit: 2017-10-04 | Discharge: 2017-10-04 | Disposition: A | Discharge status: Home / Self Care | Payer: Self-pay | Attending: Emergency Medicine | Admitting: Emergency Medicine

## 2017-10-04 ENCOUNTER — Other Ambulatory Visit: Payer: Self-pay

## 2017-10-04 DIAGNOSIS — R2231 Localized swelling, mass and lump, right upper limb: Secondary | ICD-10-CM | POA: Insufficient documentation

## 2017-10-04 DIAGNOSIS — Z5321 Procedure and treatment not carried out due to patient leaving prior to being seen by health care provider: Secondary | ICD-10-CM | POA: Insufficient documentation

## 2017-10-04 LAB — COMPREHENSIVE METABOLIC PANEL
ALK PHOS: 93 U/L (ref 38–126)
ALT: 16 U/L (ref 14–54)
AST: 20 U/L (ref 15–41)
Albumin: 3.6 g/dL (ref 3.5–5.0)
Anion gap: 8 (ref 5–15)
BUN: 14 mg/dL (ref 6–20)
CALCIUM: 8.8 mg/dL — AB (ref 8.9–10.3)
CHLORIDE: 101 mmol/L (ref 101–111)
CO2: 24 mmol/L (ref 22–32)
Creatinine, Ser: 0.57 mg/dL (ref 0.44–1.00)
GFR calc Af Amer: >60 mL/min (ref >60)
GFR calc non Af Amer: >60 mL/min (ref >60)
GLUCOSE: 184 mg/dL — AB (ref 65–99)
Potassium: 3.7 mmol/L (ref 3.5–5.1)
SODIUM: 133 mmol/L — AB (ref 135–145)
Total Bilirubin: 0.4 mg/dL (ref 0.3–1.2)
Total Protein: 7.2 g/dL (ref 6.5–8.1)

## 2017-10-04 LAB — CBC
HCT: 42.4 % (ref 35.0–47.0)
Hemoglobin: 13.7 g/dL (ref 12.0–16.0)
MCH: 26.6 pg (ref 26.0–34.0)
MCHC: 32.3 g/dL (ref 32.0–36.0)
MCV: 82.2 fL (ref 80.0–100.0)
PLATELETS: 311 10*3/uL (ref 150–440)
RBC: 5.17 MIL/uL (ref 3.80–5.20)
RDW: 14.8 % — ABNORMAL HIGH (ref 11.5–14.5)
WBC: 10.6 10*3/uL (ref 3.6–11.0)

## 2017-10-04 NOTE — ED Notes (Signed)
Called for pt in waiting room x 3 

## 2017-10-04 NOTE — ED Notes (Signed)
Called patient again no answer ,notified nurse Tina C.

## 2017-10-04 NOTE — ED Notes (Signed)
Called patient to take to room , but no answer will try again.

## 2017-10-04 NOTE — ED Triage Notes (Signed)
Pt states yesterday noticed R hand and R arm swelling. States has had this before. States usually gets cortisone shot and prednisone and it gets better. States she is out of prednisone. Able to move fingers and hand. Cap refill <3 seconds. States itches and painful.

## 2017-10-18 ENCOUNTER — Encounter: Payer: Self-pay | Admitting: Emergency Medicine

## 2017-10-18 ENCOUNTER — Emergency Department: Payer: Self-pay

## 2017-10-18 ENCOUNTER — Observation Stay
Admission: EM | Admit: 2017-10-18 | Discharge: 2017-10-18 | Disposition: A | Discharge status: Home / Self Care | Payer: Self-pay | Attending: Internal Medicine | Admitting: Internal Medicine

## 2017-10-18 DIAGNOSIS — F17210 Nicotine dependence, cigarettes, uncomplicated: Secondary | ICD-10-CM | POA: Insufficient documentation

## 2017-10-18 DIAGNOSIS — J9601 Acute respiratory failure with hypoxia: Secondary | ICD-10-CM | POA: Insufficient documentation

## 2017-10-18 DIAGNOSIS — J101 Influenza due to other identified influenza virus with other respiratory manifestations: Secondary | ICD-10-CM

## 2017-10-18 DIAGNOSIS — Z91013 Allergy to seafood: Secondary | ICD-10-CM | POA: Insufficient documentation

## 2017-10-18 DIAGNOSIS — J09X2 Influenza due to identified novel influenza A virus with other respiratory manifestations: Principal | ICD-10-CM | POA: Insufficient documentation

## 2017-10-18 DIAGNOSIS — R509 Fever, unspecified: Secondary | ICD-10-CM

## 2017-10-18 DIAGNOSIS — R0902 Hypoxemia: Secondary | ICD-10-CM | POA: Diagnosis present

## 2017-10-18 DIAGNOSIS — Z79899 Other long term (current) drug therapy: Secondary | ICD-10-CM | POA: Insufficient documentation

## 2017-10-18 LAB — URINALYSIS, COMPLETE (UACMP) WITH MICROSCOPIC
Bilirubin Urine: NEGATIVE
GLUCOSE, UA: NEGATIVE mg/dL
Ketones, ur: NEGATIVE mg/dL
Leukocytes, UA: NEGATIVE
NITRITE: NEGATIVE
PH: 6 (ref 5.0–8.0)
Protein, ur: 30 mg/dL — AB
Specific Gravity, Urine: 1.02 (ref 1.005–1.030)

## 2017-10-18 LAB — INFLUENZA PANEL BY PCR (TYPE A & B)
Influenza A By PCR: POSITIVE — AB
Influenza B By PCR: NEGATIVE

## 2017-10-18 LAB — CBC WITH DIFFERENTIAL/PLATELET
BASOS ABS: 0 10*3/uL (ref 0–0.1)
BASOS PCT: 0 %
EOS ABS: 0 10*3/uL (ref 0–0.7)
EOS PCT: 0 %
HCT: 43 % (ref 35.0–47.0)
HEMOGLOBIN: 13.8 g/dL (ref 12.0–16.0)
LYMPHS ABS: 0.6 10*3/uL — AB (ref 1.0–3.6)
LYMPHS PCT: 9 %
MCH: 26.3 pg (ref 26.0–34.0)
MCHC: 32 g/dL (ref 32.0–36.0)
MCV: 82.2 fL (ref 80.0–100.0)
Monocytes Absolute: 0.3 10*3/uL (ref 0.2–0.9)
Monocytes Relative: 5 %
NEUTROS ABS: 6.1 10*3/uL (ref 1.4–6.5)
Neutrophils Relative %: 86 %
Platelets: 229 10*3/uL (ref 150–440)
RBC: 5.24 MIL/uL — ABNORMAL HIGH (ref 3.80–5.20)
RDW: 15.4 % — AB (ref 11.5–14.5)
WBC: 7.1 10*3/uL (ref 3.6–11.0)

## 2017-10-18 LAB — COMPREHENSIVE METABOLIC PANEL
ALBUMIN: 4 g/dL (ref 3.5–5.0)
ALT: 20 U/L (ref 14–54)
AST: 20 U/L (ref 15–41)
Alkaline Phosphatase: 95 U/L (ref 38–126)
Anion gap: 8 (ref 5–15)
BUN: 11 mg/dL (ref 6–20)
CHLORIDE: 99 mmol/L — AB (ref 101–111)
CO2: 26 mmol/L (ref 22–32)
Calcium: 9 mg/dL (ref 8.9–10.3)
Creatinine, Ser: 0.94 mg/dL (ref 0.44–1.00)
GFR calc Af Amer: >60 mL/min (ref >60)
GLUCOSE: 123 mg/dL — AB (ref 65–99)
POTASSIUM: 4.4 mmol/L (ref 3.5–5.1)
Sodium: 133 mmol/L — ABNORMAL LOW (ref 135–145)
Total Bilirubin: 0.6 mg/dL (ref 0.3–1.2)
Total Protein: 7.6 g/dL (ref 6.5–8.1)

## 2017-10-18 LAB — TROPONIN I

## 2017-10-18 LAB — LACTIC ACID, PLASMA: LACTIC ACID, VENOUS: 1.3 mmol/L (ref 0.5–1.9)

## 2017-10-18 MED ORDER — IPRATROPIUM-ALBUTEROL 0.5-2.5 (3) MG/3ML IN SOLN
3.0000 mL | Freq: Once | RESPIRATORY_TRACT | Status: AC
  Administered 2017-10-18: 3 mL via RESPIRATORY_TRACT
  Filled 2017-10-18: qty 3

## 2017-10-18 MED ORDER — OSELTAMIVIR PHOSPHATE 75 MG PO CAPS
75.0000 mg | ORAL_CAPSULE | Freq: Once | ORAL | Status: AC
  Administered 2017-10-18: 75 mg via ORAL
  Filled 2017-10-18: qty 1

## 2017-10-18 MED ORDER — KETOROLAC TROMETHAMINE 30 MG/ML IJ SOLN
15.0000 mg | Freq: Once | INTRAMUSCULAR | Status: AC
  Administered 2017-10-18: 15 mg via INTRAVENOUS
  Filled 2017-10-18: qty 1

## 2017-10-18 MED ORDER — OSELTAMIVIR PHOSPHATE 75 MG PO CAPS
75.0000 mg | ORAL_CAPSULE | Freq: Two times a day (BID) | ORAL | 0 refills | Status: AC
Start: 2017-10-18 — End: 2017-10-23

## 2017-10-18 MED ORDER — ACETAMINOPHEN 500 MG PO TABS
1000.0000 mg | ORAL_TABLET | Freq: Once | ORAL | Status: AC
  Administered 2017-10-18: 1000 mg via ORAL
  Filled 2017-10-18: qty 2

## 2017-10-18 MED ORDER — SODIUM CHLORIDE 0.9 % IV BOLUS (SEPSIS)
1000.0000 mL | Freq: Once | INTRAVENOUS | Status: AC
  Administered 2017-10-18: 1000 mL via INTRAVENOUS

## 2017-10-18 MED ORDER — ALBUTEROL SULFATE HFA 108 (90 BASE) MCG/ACT IN AERS: 2.0000 | INHALATION_SPRAY | Freq: Four times a day (QID) | RESPIRATORY_TRACT | 0 refills | Status: DC | PRN

## 2017-10-18 NOTE — ED Provider Notes (Signed)
[facility] Emergency Department Provider Note  ____________________________________________  Time seen: Approximately 8:49 PM  I have reviewed the triage vital signs and the nursing notes.   HISTORY  Chief Complaint Flu like sx   HPI Holly Garner is a 36 y.o. female with a history of smoking who presents for evaluation of fever.patient reports generalized body aches, dry cough, and fever since last night. No chest pain or shortness of breath, no abdominal pain, no nausea or vomiting, no diarrhea, no dysuria or hematuria. Her body aches have been constant and severe since last night. Nothing makes it better or worse. She has tried tylenol at home. No flu shot. no known sick contact exposures.  Past Medical History:  Diagnosis Date  . History of Papanicolaou smear of cervix 04/2011   normal per pt.  . No pertinent past medical history   . 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 Throat pain in adult 01/06/2016    Past Surgical History:  Procedure Laterality Date  . ADENOIDECTOMY    . adnoidectomy    . COLPOSCOPY  09/28/2009  . TONSILLECTOMY AND ADENOIDECTOMY      Prior to Admission medications   Medication Sig Start Date End Date Taking? Authorizing Provider  EPINEPHrine (EPIPEN 2-PAK) 0.3 mg/0.3 mL IJ SOAJ injection Inject 0.3 mLs (0.3 mg total) into the muscle once. 01/07/16  Yes Gouru, Deanna Artis, MD  levonorgestrel (MIRENA) 20 MCG/24HR IUD 1 each by Intrauterine route once.   Yes [provider]  acetaminophen (TYLENOL) 325 MG tablet Take 2 tablets (650 mg total) by mouth every 6 (six) hours as needed for mild pain (or Fever >/= 101). Patient not taking: Reported on 02/06/2017 01/07/16   Ramonita Lab, MD  ciprofloxacin-dexamethasone (CIPRODEX) otic suspension Place 4 drops into the left ear 2 (two) times daily. Patient not taking: Reported on 02/06/2017  09/24/16   Joni Reining, PA-C  hydrOXYzine (ATARAX/VISTARIL) 25 MG tablet Take 1 tablet (25 mg total) by mouth 3 (three) times daily as needed. Patient not taking: Reported on 02/06/2017 02/21/15   Garrel Ridgel, PA-C  hydrOXYzine (ATARAX/VISTARIL) 50 MG tablet Take 1 tablet (50 mg total) by mouth 3 (three) times daily as needed. Patient not taking: Reported on 02/06/2017 09/24/16   Joni Reining, PA-C  nicotine (NICODERM CQ - DOSED IN MG/24 HOURS) 21 mg/24hr patch Place 1 patch (21 mg total) onto the skin daily. Patient not taking: Reported on 02/06/2017 01/07/16   Ramonita Lab, MD  oxyCODONE-acetaminophen (ROXICET) 5-325 MG tablet Take 1 tablet by mouth every 4 (four) hours as needed for severe pain. Patient not taking: Reported on 02/06/2017 09/05/15   Irean Hong, MD  ranitidine (ZANTAC) 150 MG tablet Take 1 tablet (150 mg total) by mouth at bedtime. 02/21/15 02/21/16  Garrel Ridgel, PA-C  ranitidine (ZANTAC) 150 MG tablet Take 1 tablet (150 mg total) by mouth at bedtime. Patient not taking: Reported on 10/18/2017 04/05/15 04/04/16  Garrel Ridgel, PA-C  traMADol (ULTRAM) 50 MG tablet Take 1 tablet (50 mg total) by mouth 2 (two) times daily. Patient not taking: Reported on 01/06/2016 04/26/15   Menshew, Charlesetta Ivory, PA-C    Allergies Malt; Milk-related compounds; Shellfish allergy; and Soy allergy  Family History  Problem Relation Age of Onset  . Cancer Maternal Grandmother 60       breast  Social History Social History   Tobacco Use  . Smoking status: Current Every Day Smoker    Packs/day: 1.00    Years: 10.00    Pack years: 10.00    Types: Cigarettes  . Smokeless tobacco: Never Used  Substance Use Topics  . Alcohol use: No  . Drug use: No    Review of Systems  Constitutional: + fever and body aches Eyes: Negative for visual changes. ENT: Negative for sore throat. Neck: No neck pain  Cardiovascular: Negative for chest pain. Respiratory: Negative  for shortness of breath. + cough Gastrointestinal: Negative for abdominal pain, vomiting or diarrhea. Genitourinary: Negative for dysuria. Musculoskeletal: Negative for back pain. Skin: Negative for rash. Neurological: Negative for headaches, weakness or numbness. Psych: No SI or HI  ____________________________________________   PHYSICAL EXAM:  VITAL SIGNS: ED Triage Vitals [10/18/17 1740]  Enc Vitals Group     BP 127/71     Pulse Rate (!) 125     Resp (!) 24     Temp (!) 102 F (38.9 C)     Temp Source Oral     SpO2 91 %     Weight 300 lb (136.1 kg)     Height 5\' 5"  (1.651 m)     Head Circumference      Peak Flow      Pain Score 4     Pain Loc      Pain Edu?      Excl. in GC?     Constitutional: Alert and oriented. Well appearing and in no apparent distress. HEENT:      Head: Normocephalic and atraumatic.         Eyes: Conjunctivae are normal. Sclera is non-icteric.       Mouth/Throat: Mucous membranes are moist.       Neck: Supple with no signs of meningismus. Cardiovascular: tachycardic with regular rhythm. No murmurs, gallops, or rubs. 2+ symmetrical distal pulses are present in all extremities. No JVD. Respiratory: tachypneic, hypoxic to 91% on room air, decreased air movement with faint wheezing Gastrointestinal: Soft, non tender, and non distended with positive bowel sounds. No rebound or guarding. Musculoskeletal: Nontender with normal range of motion in all extremities. No edema, cyanosis, or erythema of extremities. Neurologic: Normal speech and language. Face is symmetric. Moving all extremities. No gross focal neurologic deficits are appreciated. Skin: Skin is warm, dry and intact. No rash noted. Psychiatric: Mood and affect are normal. Speech and behavior are normal.  ____________________________________________   LABS (all labs ordered are listed, but only abnormal results are displayed)  Labs Reviewed  COMPREHENSIVE METABOLIC PANEL - Abnormal;  Notable for the following components:      Result Value   Sodium 133 (*)    Chloride 99 (*)    Glucose, Bld 123 (*)    All other components within normal limits  CBC WITH DIFFERENTIAL/PLATELET - Abnormal; Notable for the following components:   RBC 5.24 (*)    RDW 15.4 (*)    Lymphs Abs 0.6 (*)    All other components within normal limits  URINALYSIS, COMPLETE (UACMP) WITH MICROSCOPIC - Abnormal; Notable for the following components:   Color, Urine YELLOW (*)    APPearance CLEAR (*)    Hgb urine dipstick SMALL (*)    Protein, ur 30 (*)    Bacteria, UA RARE (*)    Squamous Epithelial / LPF 0-5 (*)    All other components within normal limits  INFLUENZA PANEL BY PCR (TYPE A &  B) - Abnormal; Notable for the following components:   Influenza A By PCR POSITIVE (*)    All other components within normal limits  CULTURE, BLOOD (ROUTINE X 2)  CULTURE, BLOOD (ROUTINE X 2)  URINE CULTURE  LACTIC ACID, PLASMA   ____________________________________________  EKG  ED ECG REPORT I, Nita Sicklearolina Agustine Rossitto, the attending physician, personally viewed and interpreted this ECG.  Sinus tachycardia, rate of 105, normal intervals, normal axis, no ST elevations or depressions, T-wave inversions in inferior lateral leads. no significant changes when compared to prior from 2010 ____________________________________________  RADIOLOGY  CXR: Negative ____________________________________________   PROCEDURES  Procedure(s) performed: None Procedures Critical Care performed: yes  CRITICAL CARE Performed by: Nita Sicklearolina Elleigh Cassetta  ?  Total critical care time: 35 min  Critical care time was exclusive of separately billable procedures and treating other patients.  Critical care was necessary to treat or prevent imminent or life-threatening deterioration.  Critical care was time spent personally by me on the following activities: development of treatment plan with patient and/or surrogate as well as  nursing, discussions with consultants, evaluation of patient's response to treatment, examination of patient, obtaining history from patient or surrogate, ordering and performing treatments and interventions, ordering and review of laboratory studies, ordering and review of radiographic studies, pulse oximetry and re-evaluation of patient's condition.  ____________________________________________   INITIAL IMPRESSION / ASSESSMENT AND PLAN / ED COURSE   36 y.o. female with a history of smoking who presents for evaluation of fever, body aches and cough since yesterday. Patient with a fever, tachycardic, tachypneic, and hypoxic on arrival. Flu A positive. Patient received IVF, tylenol, duonebs, toradol and remained hypoxic to low 90s requiring 2L [place]. Remains tachycardic, 2nd bolus ordered. Due to several abnormal vital signs patient was started on tamiflu and will be admitted to the Hospitalist service. CXR negatibe for PNA. Lactic, WBC, and labs otherwise WNL.       As part of my medical decision making, I reviewed the following data within the electronic MEDICAL RECORD NUMBER Nursing notes reviewed and incorporated, Labs reviewed , EKG interpreted , Radiograph reviewed , Discussed with admitting physician , Notes from prior ED visits and [place] Controlled Substance Database    Pertinent labs & imaging results that were available during my care of the patient were reviewed by me and considered in my medical decision making (see chart for details).    ____________________________________________   FINAL CLINICAL IMPRESSION(S) / ED DIAGNOSES  Final diagnoses:  Influenza A  Acute respiratory failure with hypoxia (HCC)  Fever, unspecified fever cause      NEW MEDICATIONS STARTED DURING THIS VISIT:  ED Discharge Orders    None       Note:  This document was prepared using Dragon voice recognition software and may include unintentional dictation errors.    Nita SickleVeronese, [place], MD 10/18/17  2106

## 2017-10-18 NOTE — Discharge Instructions (Signed)
You are evaluated in the emergency department and found to have influenza.  It was discussed whether the stay in the hospital versus go home and you chose to going home tonight.  You are being discharged with antiviral Tamiflu, and wheezing medication albuterol inhaler.  Take over-the-counter Tylenol and/or ibuprofen as needed for fever.  Avoid aspirin.  Drink plenty of fluids.  Return to emergency department immediately for any worsening condition including trouble breathing, chest pain, palpitations, dizziness or passing out, vomiting, or any concern for dehydration, or any other symptoms concerning to you.

## 2017-10-18 NOTE — ED Triage Notes (Signed)
Pt reports generalized body aches, fever, cough since last night.

## 2017-10-18 NOTE — Consult Note (Signed)
[facility] - [place] at [facility]   PATIENT NAME: Holly Garner    MR#:  [id number]  DATE OF BIRTH:  08/07/1981  DATE OF ADMISSION:  10/18/2017  PRIMARY CARE PHYSICIAN: Evelene CroonNiemeyer, [place], MD   REQUESTING/REFERRING PHYSICIAN: Don PerkingVeronese, MD  CHIEF COMPLAINT:   Chief Complaint  Patient presents with  . Flu like sx    HISTORY OF PRESENT ILLNESS:  Holly Garner  is a 36 y.o. female who presents with fever, myalgias, malaise, cough.  Patient was found to be influenza A positive here in the ED.  Initially she was mildly hypoxic with oxygen level in the low 90s.  This corrected with 2 L oxygen via nasal cannula.  She was given a dose of Tamiflu here.  Hospitalist were called for evaluation for possible admission given her hypoxia, however upon this writer's interview the patient states that she would rather not stay in the hospital.  Supplemental oxygen was turned off during this writer's interview with her and her oxygen level stayed greater than 90%.  PAST MEDICAL HISTORY:   Past Medical History:  Diagnosis Date  . History of Papanicolaou smear of cervix 04/2011   normal per pt.  . No pertinent past medical history   . 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 ADENOIDECTOMY    . adnoidectomy    . COLPOSCOPY  09/28/2009  . TONSILLECTOMY AND ADENOIDECTOMY      SOCIAL HISTORY:   Social History   Tobacco Use  . Smoking status: Current Every Day Smoker    Packs/day: 1.00    Years: 10.00    Pack years: 10.00    Types: Cigarettes  . Smokeless tobacco: Never Used  Substance Use Topics  . Alcohol use: No    FAMILY HISTORY:   Family History  Problem Relation Age of Onset  . Cancer Maternal Grandmother 60       breast    DRUG ALLERGIES:   Allergies  Allergen Reactions  . Malt   . Milk-Related Compounds   . Shellfish Allergy   .  Soy Allergy     MEDICATIONS AT HOME:   Prior to Admission medications   Medication Sig Start Date End Date Taking? Authorizing Provider  EPINEPHrine (EPIPEN 2-PAK) 0.3 mg/0.3 mL IJ SOAJ injection Inject 0.3 mLs (0.3 mg total) into the muscle once. 01/07/16  Yes Gouru, Deanna ArtisAruna, MD  levonorgestrel (MIRENA) 20 MCG/24HR IUD 1 each by Intrauterine route once.   Yes [provider]  acetaminophen (TYLENOL) 325 MG tablet Take 2 tablets (650 mg total) by mouth every 6 (six) hours as needed for mild pain (or Fever >/= 101). Patient not taking: Reported on 02/06/2017 01/07/16   Ramonita LabGouru, Aruna, MD  ciprofloxacin-dexamethasone (CIPRODEX) otic suspension Place 4 drops into the left ear 2 (two) times daily. Patient not taking: Reported on 02/06/2017 09/24/16   Joni ReiningSmith, Ronald K, PA-C  hydrOXYzine (ATARAX/VISTARIL) 25 MG tablet Take 1 tablet (25 mg total) by mouth 3 (three) times daily as needed. Patient not taking: Reported on 02/06/2017 02/21/15   Garrel Ridgeluffian, III William C, PA-C  hydrOXYzine (ATARAX/VISTARIL) 50 MG tablet Take 1 tablet (50 mg total) by mouth 3 (three) times daily as needed. Patient not taking: Reported on 02/06/2017 09/24/16   Joni ReiningSmith, Ronald K, PA-C  nicotine (NICODERM CQ - DOSED IN MG/24 HOURS) 21 mg/24hr patch Place 1 patch (21 mg total) onto  the skin daily. Patient not taking: Reported on 02/06/2017 01/07/16   Ramonita LabGouru, Aruna, MD  oxyCODONE-acetaminophen (ROXICET) 5-325 MG tablet Take 1 tablet by mouth every 4 (four) hours as needed for severe pain. Patient not taking: Reported on 02/06/2017 09/05/15   Irean HongSung, Jade J, MD  ranitidine (ZANTAC) 150 MG tablet Take 1 tablet (150 mg total) by mouth at bedtime. 02/21/15 02/21/16  Garrel Ridgeluffian, III William C, PA-C  ranitidine (ZANTAC) 150 MG tablet Take 1 tablet (150 mg total) by mouth at bedtime. Patient not taking: Reported on 10/18/2017 04/05/15 04/04/16  Garrel Ridgeluffian, III William C, PA-C  traMADol (ULTRAM) 50 MG tablet Take 1 tablet (50 mg total) by mouth 2 (two) times  daily. Patient not taking: Reported on 01/06/2016 04/26/15   Menshew, Charlesetta IvoryJenise V Bacon, PA-C    REVIEW OF SYSTEMS:  Review of Systems  Constitutional: Positive for chills and fever. Negative for malaise/fatigue and weight loss.  HENT: Negative for ear pain, hearing loss and tinnitus.   Eyes: Negative for blurred vision, double vision, pain and redness.  Respiratory: Positive for cough and shortness of breath. Negative for hemoptysis.   Cardiovascular: Negative for chest pain, palpitations, orthopnea and leg swelling.  Gastrointestinal: Negative for abdominal pain, constipation, diarrhea, nausea and vomiting.  Genitourinary: Negative for dysuria, frequency and hematuria.  Musculoskeletal: Positive for myalgias. Negative for back pain, joint pain and neck pain.  Skin:       No acne, rash, or lesions  Neurological: Negative for dizziness, tremors, focal weakness and weakness.  Endo/Heme/Allergies: Negative for polydipsia. Does not bruise/bleed easily.  Psychiatric/Behavioral: Negative for depression. The patient is not nervous/anxious and does not have insomnia.      VITAL SIGNS:   Vitals:   10/18/17 1930 10/18/17 2018 10/18/17 2045 10/18/17 2100  BP: 127/60 117/66 129/83 116/67  Pulse: (!) 117 (!) 112 (!) 101 99  Resp: 11 16 (!) 21 16  Temp:  98.3 F (36.8 C)    TempSrc:  Oral    SpO2: (!) 87% 95% 96% 94%  Weight:      Height:       Wt Readings from Last 3 Encounters:  10/18/17 136.1 kg (300 lb)  02/06/17 (!) 153.3 kg (338 lb)  11/18/16 (!) 140.6 kg (310 lb)    PHYSICAL EXAMINATION:  Physical Exam  Vitals reviewed. Constitutional: She is oriented to person, place, and time. She appears well-developed and well-nourished. No distress.  HENT:  Head: Normocephalic and atraumatic.  Mouth/Throat: Oropharynx is clear and moist.  Eyes: Conjunctivae and EOM are normal. Pupils are equal, round, and reactive to light. No scleral icterus.  Neck: Normal range of motion. Neck supple. No  JVD present. No thyromegaly present.  Cardiovascular: Regular rhythm and intact distal pulses. Exam reveals no gallop and no friction rub.  No murmur heard. Tachycardic  Respiratory: Effort normal and breath sounds normal. No respiratory distress. She has no wheezes. She has no rales.  GI: Soft. Bowel sounds are normal. She exhibits no distension. There is no tenderness.  Musculoskeletal: Normal range of motion. She exhibits no edema.  No arthritis, no gout  Lymphadenopathy:    She has no cervical adenopathy.  Neurological: She is alert and oriented to person, place, and time. No cranial nerve deficit.  No dysarthria, no aphasia  Skin: Skin is warm and dry. No rash noted. No erythema.  Psychiatric: She has a normal mood and affect. Her behavior is normal. Judgment and thought content normal.    LABORATORY PANEL:  CBC Recent Labs  Lab 10/18/17 1746  WBC 7.1  HGB 13.8  HCT 43.0  PLT 229   ------------------------------------------------------------------------------------------------------------------  Chemistries  Recent Labs  Lab 10/18/17 1746  NA 133*  K 4.4  CL 99*  CO2 26  GLUCOSE 123*  BUN 11  CREATININE 0.94  CALCIUM 9.0  AST 20  ALT 20  ALKPHOS 95  BILITOT 0.6   ------------------------------------------------------------------------------------------------------------------  Cardiac Enzymes Recent Labs  Lab 10/18/17 1746  TROPONINI <0.03   ------------------------------------------------------------------------------------------------------------------  RADIOLOGY:  Dg Chest Port 1 View  Result Date: 10/18/2017 CLINICAL DATA:  Sepsis.  Cough and fever EXAM: PORTABLE CHEST 1 VIEW COMPARISON:  10/31/2008 FINDINGS: The heart size and mediastinal contours are within normal limits. Both lungs are clear. The visualized skeletal structures are unremarkable. IMPRESSION: No active disease. Electronically Signed   By: Marlan Palau M.D.   On: 10/18/2017 18:22     EKG:   Orders placed or performed during the hospital encounter of 10/18/17  . ED EKG 12-Lead  . ED EKG 12-Lead    IMPRESSION AND PLAN:  Principal Problem:   Influenza A -patient was given a dose of Tamiflu here in the ED.  Recommend that she continue to take Tamiflu for the next 5 days.  Also recommend patient be discharged with albuterol rescue inhaler.  She does not have any diagnosed lung disease but has a long smoking history.  Patient was given instructions for over-the-counter medications for symptom relief as well Active Problems:   Hypoxia -patient was initially somewhat hypoxic, but she is satting above 90% here now without oxygen.  Recommend discharge with rescue inhaler as above.  Patient was given instructions to return for follow-up care if she had aggravation of her symptoms, if her cough becomes productive, if her fevers persist, or with any other new or worsening symptoms.  All the records are reviewed and case discussed with ED provider. Management plans discussed with the patient and/or family.  TOTAL TIME TAKING CARE OF THIS PATIENT: 40 minutes.   Amritha Yorke FIELDING 10/18/2017, 9:47 PM  [facility]  Office  [phone]  CC: Primary care physician; Evelene Croon, MD  Note:  This document was prepared using Dragon voice recognition software and may include unintentional dictation errors.

## 2017-10-18 NOTE — ED Provider Notes (Signed)
Discussed with Dr. Anne HahnWillis, hospitalist who discussed with the patient who would really like to go home.  Patient maintain oxygenation with Dr. Anne HahnWillis in the room in the low 90s, and patient was relatively comfortable with that.  She was given return precautions and will be discharged with Tamiflu as well as albuterol inhaler.     Governor RooksLord, Bradey Luzier, MD 10/18/17 2154

## 2017-10-20 LAB — URINE CULTURE

## 2017-10-23 LAB — CULTURE, BLOOD (ROUTINE X 2)
CULTURE: NO GROWTH
Culture: NO GROWTH
SPECIAL REQUESTS: ADEQUATE
SPECIAL REQUESTS: ADEQUATE

## 2018-01-28 ENCOUNTER — Encounter: Payer: Self-pay | Admitting: Emergency Medicine

## 2018-01-28 ENCOUNTER — Emergency Department
Admission: EM | Admit: 2018-01-28 | Discharge: 2018-01-28 | Disposition: A | Discharge status: Home / Self Care | Payer: Self-pay | Attending: Emergency Medicine | Admitting: Emergency Medicine

## 2018-01-28 DIAGNOSIS — R21 Rash and other nonspecific skin eruption: Secondary | ICD-10-CM | POA: Insufficient documentation

## 2018-01-28 DIAGNOSIS — F17210 Nicotine dependence, cigarettes, uncomplicated: Secondary | ICD-10-CM | POA: Insufficient documentation

## 2018-01-28 MED ORDER — METHYLPREDNISOLONE SODIUM SUCC 125 MG IJ SOLR
125.0000 mg | Freq: Once | INTRAMUSCULAR | Status: AC
  Administered 2018-01-28: 125 mg via INTRAMUSCULAR
  Filled 2018-01-28: qty 2

## 2018-01-28 MED ORDER — CETIRIZINE HCL 10 MG PO TABS: 10.0000 mg | ORAL_TABLET | Freq: Every day | ORAL | 1 refills | Status: DC

## 2018-01-28 MED ORDER — PREDNISONE 10 MG PO TABS: ORAL_TABLET | ORAL | 0 refills | Status: DC

## 2018-01-28 NOTE — Discharge Instructions (Addendum)
Follow-up with your primary care provider for any continued problems.  Also consider seeing a dermatologist or going back to your allergy specialist for further investigation of your skin reactions.  Begin taking Zyrtec 10 mg once daily.  There is a prescription for 30 days along with a prescription for 1 refill.  It is important that you follow-up with your primary care doctor for further evaluation as it is extremely dangerous to continue taking chronic steroids instead of finding the underlying reason for your skin problems.

## 2018-01-28 NOTE — ED Triage Notes (Signed)
Patient woke up this morning with swelling to her chin and her right foot. Patient states that ever since she was stung by a canadian thistle plant, different parts of her body will swell  Up randomly.  Patient states usually she gets prednisone/cortisone and feels better quickly.  Patient is in no obvious distress at this time.

## 2018-01-28 NOTE — ED Provider Notes (Signed)
[facility] Emergency Department Provider Note  ____________________________________________   First MD Initiated Contact with Patient 01/28/18 [phone]     (approximate)  I have reviewed the triage vital signs and the nursing notes.   HISTORY  Chief Complaint Facial Swelling   HPI Holly Garner is a 37 y.o. female is here with complaint of swelling to her chin and right foot along with itching.  Patient states that she has had problems for approximately 3 years after working with her husband when she was stuck by a [place] thistle plant.  She states often known since that time she intermittently has areas that swell and itch.  She has been receiving cortisone shots and cortisone tablets which she takes to control this.  She states that she has had allergy workup and was found to have some allergies to foods which she is now omitted from her diet.  She states she has never taken over-the-counter antihistamines for prevention reasons.  She denies any difficulty breathing, swallowing, talking.   Past Medical History:  Diagnosis Date  . History of Papanicolaou smear of cervix 04/2011   normal per pt.  . No pertinent past medical history   . 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 Influenza A 10/18/2017  . Hypoxia 10/18/2017  . Throat pain in adult 01/06/2016    Past Surgical History:  Procedure Laterality Date  . ADENOIDECTOMY    . adnoidectomy    . COLPOSCOPY  09/28/2009  . TONSILLECTOMY AND ADENOIDECTOMY      Prior to Admission medications   Medication Sig Start Date End Date Taking? Authorizing Provider  albuterol (PROVENTIL HFA;VENTOLIN HFA) 108 (90 Base) MCG/ACT inhaler Inhale 2 puffs into the lungs every 6 (six) hours as needed for wheezing or shortness of breath. 10/18/17   Governor Rooks, MD  cetirizine (ZYRTEC ALLERGY) 10 MG tablet Take 1 tablet (10 mg total)  by mouth daily. 01/28/18   Tommi Rumps, PA-C  EPINEPHrine (EPIPEN 2-PAK) 0.3 mg/0.3 mL IJ SOAJ injection Inject 0.3 mLs (0.3 mg total) into the muscle once. 01/07/16   Ramonita Lab, MD  levonorgestrel (MIRENA) 20 MCG/24HR IUD 1 each by Intrauterine route once.    [provider]  predniSONE (DELTASONE) 10 MG tablet 1 tablet daily 01/28/18   Tommi Rumps, PA-C    Allergies Malt; Milk-related compounds; Shellfish allergy; and Soy allergy  Family History  Problem Relation Age of Onset  . Cancer Maternal Grandmother 49       breast    Social History Social History   Tobacco Use  . Smoking status: Current Every Day Smoker    Packs/day: 1.00    Years: 10.00    Pack years: 10.00    Types: Cigarettes  . Smokeless tobacco: Never Used  Substance Use Topics  . Alcohol use: No  . Drug use: No    Review of Systems Constitutional: No fever/chills Eyes: No visual changes. ENT: Negative sore throat. Cardiovascular: Denies chest pain. Respiratory: Denies shortness of breath. Gastrointestinal: No abdominal pain.  No nausea, no vomiting.  Musculoskeletal: Negative for back pain. Skin: Positive skin rash. Neurological: Negative for headaches. ___________________________________________   PHYSICAL EXAM:  VITAL SIGNS: ED Triage Vitals  Enc Vitals Group     BP 01/28/18 0843 (!) 149/73     Pulse Rate 01/28/18 0840 98     Resp 01/28/18 0840 15  Temp 01/28/18 0840 98.4 F (36.9 C)     Temp Source 01/28/18 0840 Oral     SpO2 01/28/18 0840 100 %     Weight 01/28/18 0842 (!) 316 lb (143.3 kg)     Height 01/28/18 0842 5\' 5"  (1.651 m)     Head Circumference --      Peak Flow --      Pain Score 01/28/18 0842 3     Pain Loc --      Pain Edu? --      Excl. in GC? --     Constitutional: Alert and oriented. Well appearing and in no acute distress. Eyes: Conjunctivae are normal.  Head: Atraumatic. Nose: No congestion/rhinnorhea.  Mouth/Throat: Mucous membranes are  moist.  Oropharynx non-erythematous.  No edema. Neck: No stridor.   Hematological/Lymphatic/Immunilogical: No cervical lymphadenopathy. Cardiovascular: Normal rate, regular rhythm. Grossly normal heart sounds.  Good peripheral circulation. Respiratory: Normal respiratory effort.  No retractions. Lungs CTAB. Musculoskeletal: Moves upper and lower extremities without any difficulty. Neurologic:  Normal speech and language. No gross focal neurologic deficits are appreciated.  Skin:  Skin is warm, dry and intact.  Right foot fourth digit erythematous with minimal edema.  No open wound noted.  All middle to left lateral chin there is some erythema present.  No drainage is present.  Nontender to touch. Psychiatric: Mood and affect are normal. Speech and behavior are normal.  ____________________________________________   LABS (all labs ordered are listed, but only abnormal results are displayed)  Labs Reviewed - No data to display  PROCEDURES  Procedure(s) performed: None  Procedures  Critical Care performed: No  ____________________________________________   INITIAL IMPRESSION / ASSESSMENT AND PLAN / ED COURSE  As part of my medical decision making, I reviewed the following data within the electronic MEDICAL RECORD NUMBER Notes from prior ED visits and [place] Controlled Substance Database  Discussed chronic steroid use with patient.  She is to follow-up with her PCP and also obtain either an allergy specialist or see a dermatologist which was listed on her discharge papers.  Patient was given Solu-Medrol 125 mg IM while in the department.  She was given prednisone 10 mg 1 daily number 10 tablets.  Patient is to begin taking Zyrtec 1 daily to see if this helps with prevention of her rash.  I discouraged against continued use of oral steroids and to find the source of her allergies.  ____________________________________________   FINAL CLINICAL IMPRESSION(S) / ED DIAGNOSES  Final diagnoses:    Rash and nonspecific skin eruption     ED Discharge Orders        Ordered    predniSONE (DELTASONE) 10 MG tablet     01/28/18 1039    cetirizine (ZYRTEC ALLERGY) 10 MG tablet  Daily     01/28/18 1039       Note:  This document was prepared using Dragon voice recognition software and may include unintentional dictation errors.    Tommi RumpsSummers, Hagar Sadiq L, PA-C 01/28/18 1352    Minna AntisPaduchowski, Kevin, MD 01/28/18 1517

## 2018-01-28 NOTE — ED Notes (Signed)
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

## 2018-11-04 ENCOUNTER — Emergency Department
Admission: EM | Admit: 2018-11-04 | Discharge: 2018-11-04 | Disposition: A | Discharge status: Home / Self Care | Payer: Medicaid Other | Attending: Emergency Medicine | Admitting: Emergency Medicine

## 2018-11-04 ENCOUNTER — Other Ambulatory Visit: Payer: Self-pay

## 2018-11-04 ENCOUNTER — Encounter: Payer: Self-pay | Admitting: Emergency Medicine

## 2018-11-04 DIAGNOSIS — M79671 Pain in right foot: Secondary | ICD-10-CM | POA: Diagnosis present

## 2018-11-04 DIAGNOSIS — Z79899 Other long term (current) drug therapy: Secondary | ICD-10-CM | POA: Diagnosis not present

## 2018-11-04 DIAGNOSIS — F17210 Nicotine dependence, cigarettes, uncomplicated: Secondary | ICD-10-CM | POA: Insufficient documentation

## 2018-11-04 DIAGNOSIS — L02611 Cutaneous abscess of right foot: Secondary | ICD-10-CM | POA: Insufficient documentation

## 2018-11-04 MED ORDER — TRAMADOL HCL 50 MG PO TABS: 50.0000 mg | ORAL_TABLET | Freq: Four times a day (QID) | ORAL | 0 refills | Status: DC | PRN

## 2018-11-04 MED ORDER — LIDOCAINE 5 % EX PTCH
1.0000 | MEDICATED_PATCH | CUTANEOUS | Status: DC
  Administered 2018-11-04: 1 via TRANSDERMAL
  Filled 2018-11-04 (×2): qty 1

## 2018-11-04 MED ORDER — NAPROXEN 500 MG PO TABS: 500.0000 mg | ORAL_TABLET | Freq: Two times a day (BID) | ORAL | Status: DC

## 2018-11-04 MED ORDER — SULFAMETHOXAZOLE-TRIMETHOPRIM 800-160 MG PO TABS: 1.0000 | ORAL_TABLET | Freq: Two times a day (BID) | ORAL | 0 refills | Status: DC

## 2018-11-04 NOTE — ED Provider Notes (Signed)
[facility] Emergency Department Provider Note   ____________________________________________   First MD Initiated Contact with Patient 11/04/18 1058     (approximate)  I have reviewed the triage vital signs and the nursing notes.   HISTORY  Chief Complaint Foot Pain    HPI Holly Garner is a 38 y.o. female patient presents with abscess to the right lateral aspect of foot.  Patient states lesion appeared 5 days ago but ruptured last night.  Patient states the lesion is only painful with palpation.  Patient currently rates the pain as a 2/10.  Patient the pain increased to 5/10 when touched.  Patient describes the pain as "sore".  No palliative measure for complaint.   Past Medical History:  Diagnosis Date  . History of Papanicolaou smear of cervix 04/2011   normal per pt.  . No pertinent past medical history   . 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 Influenza A 10/18/2017  . Hypoxia 10/18/2017  . Throat pain in adult 01/06/2016    Past Surgical History:  Procedure Laterality Date  . ADENOIDECTOMY    . adnoidectomy    . COLPOSCOPY  09/28/2009  . TONSILLECTOMY AND ADENOIDECTOMY      Prior to Admission medications   Medication Sig Start Date End Date Taking? Authorizing Provider  albuterol (PROVENTIL HFA;VENTOLIN HFA) 108 (90 Base) MCG/ACT inhaler Inhale 2 puffs into the lungs every 6 (six) hours as needed for wheezing or shortness of breath. 10/18/17   Governor Rooks, MD  cetirizine (ZYRTEC ALLERGY) 10 MG tablet Take 1 tablet (10 mg total) by mouth daily. 01/28/18   Tommi Rumps, PA-C  EPINEPHrine (EPIPEN 2-PAK) 0.3 mg/0.3 mL IJ SOAJ injection Inject 0.3 mLs (0.3 mg total) into the muscle once. 01/07/16   Ramonita Lab, MD  levonorgestrel (MIRENA) 20 MCG/24HR IUD 1 each by Intrauterine route once.    [provider]  naproxen (NAPROSYN) 500 MG  tablet Take 1 tablet (500 mg total) by mouth 2 (two) times daily with a meal. 11/04/18   Joni Reining, PA-C  predniSONE (DELTASONE) 10 MG tablet 1 tablet daily 01/28/18   Tommi Rumps, PA-C  sulfamethoxazole-trimethoprim (BACTRIM DS,SEPTRA DS) 800-160 MG tablet Take 1 tablet by mouth 2 (two) times daily. 11/04/18   Joni Reining, PA-C  traMADol (ULTRAM) 50 MG tablet Take 1 tablet (50 mg total) by mouth every 6 (six) hours as needed. 11/04/18 11/04/19  Joni Reining, PA-C    Allergies Malt; Milk-related compounds; Shellfish allergy; and Soy allergy  Family History  Problem Relation Age of Onset  . Cancer Maternal Grandmother 73       breast    Social History Social History   Tobacco Use  . Smoking status: Current Every Day Smoker    Packs/day: 1.00    Years: 10.00    Pack years: 10.00    Types: Cigarettes  . Smokeless tobacco: Never Used  Substance Use Topics  . Alcohol use: No  . Drug use: No    Review of Systems  Constitutional: No fever/chills Eyes: No visual changes. ENT: No sore throat. Cardiovascular: Denies chest pain. Respiratory: Denies shortness of breath. Gastrointestinal: No abdominal pain.  No nausea, no vomiting.  No diarrhea.  No constipation. Genitourinary: Negative for dysuria. Musculoskeletal: Negative for back pain. Skin: Abscess right foot. Neurological: Negative for headaches, focal weakness or numbness.  ____________________________________________   PHYSICAL EXAM:  VITAL SIGNS: ED Triage Vitals  Enc Vitals Group     BP 11/04/18 1053 (!) 150/98     Pulse Rate 11/04/18 1053 (!) 114     Resp 11/04/18 1053 18     Temp 11/04/18 1053 97.9 F (36.6 C)     Temp Source 11/04/18 1053 Oral     SpO2 11/04/18 1053 98 %     Weight 11/04/18 1049 (!) 316 lb (143.3 kg)     Height 11/04/18 1049 5\' 5"  (1.651 m)     Head Circumference --      Peak Flow --      Pain Score 11/04/18 1049 2     Pain Loc --      Pain Edu? --      Excl. in GC? --       Constitutional: Alert and oriented. Well appearing and in no acute distress. Cardiovascular: Normal rate, regular rhythm. Grossly normal heart sounds.  Good peripheral circulation. Respiratory: Normal respiratory effort.  No retractions. Lungs CTAB. Skin: Ruptured bullous lesion lateral aspect of right foot.Marland Kitchen. Psychiatric: Mood and affect are normal. Speech and behavior are normal.  ____________________________________________   LABS (all labs ordered are listed, but only abnormal results are displayed)  Labs Reviewed - No data to display ____________________________________________  EKG   ____________________________________________  RADIOLOGY  ED MD interpretation:    Official radiology report(s): No results found.  ____________________________________________   PROCEDURES  Procedure(s) performed: None  Procedures  Critical Care performed: No  ____________________________________________   INITIAL IMPRESSION / ASSESSMENT AND PLAN / ED COURSE  As part of my medical decision making, I reviewed the following data within the electronic MEDICAL RECORD NUMBER    Right foot pain secondary to draining abscess.  Patient given discharge care instructions.  Patient given prescription for Bactrim, naproxen, and tramadol.  Patient advised follow-up PCP if no improvement in 1 week.  Return to ED if condition worsens.      ____________________________________________   FINAL CLINICAL IMPRESSION(S) / ED DIAGNOSES  Final diagnoses:  Abscess of right foot     ED Discharge Orders         Ordered    sulfamethoxazole-trimethoprim (BACTRIM DS,SEPTRA DS) 800-160 MG tablet  2 times daily     11/04/18 1141    naproxen (NAPROSYN) 500 MG tablet  2 times daily with meals     11/04/18 1141    traMADol (ULTRAM) 50 MG tablet  Every 6 hours PRN     11/04/18 1141           Note:  This document was prepared using Dragon voice recognition software and may include  unintentional dictation errors.    Joni ReiningSmith, Anamae Rochelle K, PA-C 11/04/18 1206    Minna AntisPaduchowski, Kevin, MD 11/04/18 [phone]

## 2018-11-04 NOTE — ED Notes (Signed)
See triage note  Presents with swelling to right lateral foot  States she noticed  A possible insect bite  Area is draining slightly

## 2018-11-04 NOTE — ED Triage Notes (Signed)
Pt states possible boil on side of foot, says it's draining and painful, walked to triage with no issues.

## 2019-03-24 ENCOUNTER — Other Ambulatory Visit: Payer: Self-pay

## 2019-03-24 ENCOUNTER — Ambulatory Visit: Payer: Medicaid Other | Admitting: Internal Medicine

## 2019-03-24 ENCOUNTER — Encounter: Payer: Self-pay | Admitting: Internal Medicine

## 2019-03-24 VITALS — BP 140/96 | HR 98 | Temp 98.1°F | Ht 64.75 in | Wt 353.0 lb

## 2019-03-24 DIAGNOSIS — R030 Elevated blood-pressure reading, without diagnosis of hypertension: Secondary | ICD-10-CM | POA: Diagnosis not present

## 2019-03-24 DIAGNOSIS — J302 Other seasonal allergic rhinitis: Secondary | ICD-10-CM | POA: Diagnosis not present

## 2019-03-24 DIAGNOSIS — R609 Edema, unspecified: Secondary | ICD-10-CM | POA: Insufficient documentation

## 2019-03-24 NOTE — Patient Instructions (Signed)

## 2019-03-24 NOTE — Progress Notes (Signed)
HPI  Pt presents to the clinic today to establish care. She is transferring care from Dr. Bernita Buffy.   Intermittent Swelling: She reports she has had this evaluated by ENT and allergy but reports nobody can figure out why she swells. She takes Prednisone as needed for times when she swells with good relief. She also takes Zyrtec daily.  Seasonal Allergies: Occur all year long. She takes Zyrtec daily. She uses Albuterol as needed with good relief.  She reports she is having trouble hearing. She noticed about 1 month ago. She reports her right ear drains constantly. She denies runny nose, nasal congestion, ear pain or sore throat. She had T-tubes put in 3 years ago, but no longer see's ENT. She would like referral to ENT.  Flu: never Tetanus: ? 2013 Pap Smear: 01/2017 [facility]: annually  Past Medical History:  Diagnosis Date  . History of Papanicolaou smear of cervix 04/2011   normal per pt.  . No pertinent past medical history   . 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 albuterol (PROVENTIL HFA;VENTOLIN HFA) 108 (90 Base) MCG/ACT inhaler Inhale 2 puffs into the lungs every 6 (six) hours as needed for wheezing or shortness of breath. 1 Inhaler 0  . cetirizine (ZYRTEC ALLERGY) 10 MG tablet Take 1 tablet (10 mg total) by mouth daily. 30 tablet 1  . EPINEPHrine (EPIPEN 2-PAK) 0.3 mg/0.3 mL IJ SOAJ injection Inject 0.3 mLs (0.3 mg total) into the muscle once. 2 Device 0  . levonorgestrel (MIRENA) 20 MCG/24HR IUD 1 each by Intrauterine route once.    . predniSONE (DELTASONE) 10 MG tablet 1 tablet daily (Patient taking differently: 10 mg daily as needed. For swelling) 10 tablet 0   No current facility-administered medications for this visit.     Allergies  Allergen Reactions  . Malt   . Milk-Related Compounds   . Shellfish Allergy   . Soy Allergy     Family History  Problem Relation  Age of Onset  . Breast cancer Maternal Grandmother     Social History   Socioeconomic History  . Marital status: Married    Spouse name: Not on file  . Number of children: Not on file  . Years of education: 54  . Highest education level: Not on file  Occupational History  . Occupation: unemployed  . Occupation: homemaker  Social Needs  . Financial resource strain: Not on file  . Food insecurity:    Worry: Not on file    Inability: Not on file  . Transportation needs:    Medical: Not on file    Non-medical: Not on file  Tobacco Use  . Smoking status: Current Every Day Smoker    Packs/day: 1.00    Years: 10.00    Pack years: 10.00    Types: Cigarettes  . Smokeless tobacco: Never Used  Substance and Sexual Activity  . Alcohol use: No  . Drug use: No  . Sexual activity: Yes    Birth control/protection: I.U.D.  Lifestyle  . Physical activity:    Days per week: Not on file    Minutes per session: Not on file  . Stress: Not on file  Relationships  . Social connections:    Talks on phone: Not on file    Gets together: Not on file    Attends religious service: Not on file    Active member of club or  organization: Not on file    Attends meetings of clubs or organizations: Not on file    Relationship status: Not on file  . Intimate partner violence:    Fear of current or ex partner: Not on file    Emotionally abused: Not on file    Physically abused: Not on file    Forced sexual activity: Not on file  Other Topics Concern  . Not on file  Social History Narrative  . Not on file    ROS:  Constitutional: Denies fever, malaise, fatigue, headache or abrupt weight changes.  HEENT: Pt reports decreased hearing, drainage in right ear. Denies eye pain, eye redness, ear pain, ringing in the ears, wax buildup, runny nose, nasal congestion, bloody nose, or sore throat. Respiratory: Denies difficulty breathing, shortness of breath, cough or sputum production.   Cardiovascular:  Denies chest pain, chest tightness, palpitations or swelling in the hands or feet.  Gastrointestinal: Denies abdominal pain, bloating, constipation, diarrhea or blood in the stool.  GU: Denies frequency, urgency, pain with urination, blood in urine, odor or discharge. Musculoskeletal: Denies decrease in range of motion, difficulty with gait, muscle pain or joint pain and swelling.  Skin: Pt reports intermittent swelling. Denies redness, rashes, lesions or ulcercations.  Neurological: Denies dizziness, difficulty with memory, difficulty with speech or problems with balance and coordination.  Psych: Denies anxiety, depression, SI/HI.  No other specific complaints in a complete review of systems (except as listed in HPI above).  PE:  Pulse 98   Temp 98.1 F (36.7 C) (Oral)   Ht 5' 4.75" (1.645 m)   Wt (!) 353 lb (160.1 kg)   SpO2 97%   BMI 59.20 kg/m  Wt Readings from Last 3 Encounters:  03/24/19 (!) 353 lb (160.1 kg)  11/04/18 (!) 316 lb (143.3 kg)  01/28/18 (!) 316 lb (143.3 kg)    General: Appears her stated age, obese, in NAD. HEENT: Head: normal shape and size; Eyes: sclera white, no icterus, conjunctiva pink, PERRLA and EOMs intact; Ears: Tm's gray and intact, normal light reflex, T-tubes noted in right ear;Throat/Mouth: Teeth present, mucosa pink and moist, no lesions or ulcerations noted.  Neck: Subcutaneous swelling noted of the posterior neck.  Cardiovascular: Normal rate and rhythm. S1,S2 noted.  No murmur, rubs or gallops noted.  Pulmonary/Chest: Normal effort and positive vesicular breath sounds. No respiratory distress. No wheezes, rales or ronchi noted.  Musculoskeletal:  No difficulty with gait.  Neurological: Alert and oriented.  Psychiatric: Mood and affect normal. Behavior is normal. Judgment and thought content normal.    BMET    Component Value Date/Time   NA 133 (L) 10/18/2017 1746   K 4.4 10/18/2017 1746   CL 99 (L) 10/18/2017 1746   CO2 26 10/18/2017  1746   GLUCOSE 123 (H) 10/18/2017 1746   BUN 11 10/18/2017 1746   CREATININE 0.94 10/18/2017 1746   CALCIUM 9.0 10/18/2017 1746   GFRNONAA >60 10/18/2017 1746   GFRAA >60 10/18/2017 1746    Lipid Panel  No results found for: CHOL, TRIG, HDL, CHOLHDL, VLDL, LDLCALC  CBC    Component Value Date/Time   WBC 7.1 10/18/2017 1746   RBC 5.24 (H) 10/18/2017 1746   HGB 13.8 10/18/2017 1746   HGB 13.2 10/06/2012 0955   HCT 43.0 10/18/2017 1746   HCT 42.0 10/06/2012 0955   PLT 229 10/18/2017 1746   PLT 309 10/06/2012 0955   MCV 82.2 10/18/2017 1746   MCV 79 (L) 10/06/2012 2595  MCH 26.3 10/18/2017 1746   MCHC 32.0 10/18/2017 1746   RDW 15.4 (H) 10/18/2017 1746   RDW 15.1 (H) 10/06/2012 0955   LYMPHSABS 0.6 (L) 10/18/2017 1746   MONOABS 0.3 10/18/2017 1746   EOSABS 0.0 10/18/2017 1746   BASOSABS 0.0 10/18/2017 1746    Hgb A1C No results found for: HGBA1C   Assessment and Plan:  Intermittent Swelling:  Will see if Medicaid will cover CBC, CMET, TSH, Lipid, A1C, ESR, ANA, RF,. CRP Continue Prednisone as needed for now  Elevated Blood Pressure:  No diagnosis of HTN Does not monitor at home Reinforced DASH diet and exercise for weight loss  Seasonal Allergies:  Continue Zyrtec and Albuterol as prescribed  RTC in 2 weeks for BP check

## 2019-04-07 ENCOUNTER — Other Ambulatory Visit: Payer: Self-pay

## 2019-04-07 ENCOUNTER — Ambulatory Visit: Payer: Medicaid Other | Admitting: Internal Medicine

## 2019-04-07 ENCOUNTER — Telehealth: Payer: Self-pay | Admitting: Internal Medicine

## 2019-04-07 ENCOUNTER — Encounter: Payer: Self-pay | Admitting: Internal Medicine

## 2019-04-07 VITALS — BP 140/86 | HR 93 | Temp 97.9°F | Wt 350.0 lb

## 2019-04-07 DIAGNOSIS — I10 Essential (primary) hypertension: Secondary | ICD-10-CM | POA: Diagnosis not present

## 2019-04-07 DIAGNOSIS — R609 Edema, unspecified: Secondary | ICD-10-CM

## 2019-04-07 DIAGNOSIS — J302 Other seasonal allergic rhinitis: Secondary | ICD-10-CM | POA: Diagnosis not present

## 2019-04-07 DIAGNOSIS — R7309 Other abnormal glucose: Secondary | ICD-10-CM

## 2019-04-07 LAB — BASIC METABOLIC PANEL
BUN: 9 mg/dL (ref 6–23)
CO2: 31 mEq/L (ref 19–32)
Calcium: 8.9 mg/dL (ref 8.4–10.5)
Chloride: 99 mEq/L (ref 96–112)
Creatinine, Ser: 0.58 mg/dL (ref 0.40–1.20)
GFR: 117.88 mL/min (ref >60.00)
Glucose, Bld: 204 mg/dL — ABNORMAL HIGH (ref 70–99)
Potassium: 4.4 mEq/L (ref 3.5–5.1)
Sodium: 136 mEq/L (ref 135–145)

## 2019-04-07 NOTE — Assessment & Plan Note (Signed)
Try Claritin OTC .

## 2019-04-07 NOTE — Assessment & Plan Note (Addendum)
BMET today. If kidney function normal, will start Losartan/HCTZ 25-12.5mg daily. Encouraged smoking cessation, DASH diet, and exercise for weight loss.  RTC in 2 weeks for BP check.

## 2019-04-07 NOTE — Progress Notes (Signed)
Subjective:    Patient ID: Holly Garner, female    DOB: Jan 15, 1981, 38 y.o.   MRN: [id number]  HPI  Patient presents to the clinic today for 2-week follow up for high blood pressure.   Her BP at her last visit was 140/96.  She is not currently taking any anti-hypertensive medications.  She reports she is trying to consume a low-sodium diet.  She also was associating her high blood pressure readings to tooth pain on last visit and headache today.  She endorses smoking cigarettes but is trying to cut back. ECG from 09/2017 reviewed.  Review of Systems      Past Medical History:  Diagnosis Date  . History of Papanicolaou smear of cervix 04/2011   normal per pt.  . No pertinent past medical history   . 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 albuterol (PROVENTIL HFA;VENTOLIN HFA) 108 (90 Base) MCG/ACT inhaler Inhale 2 puffs into the lungs every 6 (six) hours as needed for wheezing or shortness of breath. 1 Inhaler 0  . cetirizine (ZYRTEC ALLERGY) 10 MG tablet Take 1 tablet (10 mg total) by mouth daily. 30 tablet 1  . EPINEPHrine (EPIPEN 2-PAK) 0.3 mg/0.3 mL IJ SOAJ injection Inject 0.3 mLs (0.3 mg total) into the muscle once. 2 Device 0  . levonorgestrel (MIRENA) 20 MCG/24HR IUD 1 each by Intrauterine route once.    . predniSONE (DELTASONE) 10 MG tablet 1 tablet daily (Patient taking differently: 10 mg daily as needed. For swelling) 10 tablet 0   No current facility-administered medications for this visit.     Allergies  Allergen Reactions  . Malt   . Milk-Related Compounds   . Shellfish Allergy   . Soy Allergy     Family History  Problem Relation Age of Onset  . Breast cancer Maternal Grandmother     Social History   Socioeconomic History  . Marital status: Married    Spouse name: Not on file  . Number of children: Not on file  . Years of education: 44  . Highest  education level: Not on file  Occupational History  . Occupation: unemployed  . Occupation: homemaker  Social Needs  . Financial resource strain: Not on file  . Food insecurity    Worry: Not on file    Inability: Not on file  . Transportation needs    Medical: Not on file    Non-medical: Not on file  Tobacco Use  . Smoking status: Current Every Day Smoker    Packs/day: 1.00    Years: 10.00    Pack years: 10.00    Types: Cigarettes  . Smokeless tobacco: Never Used  Substance and Sexual Activity  . Alcohol use: No  . Drug use: No  . Sexual activity: Yes    Birth control/protection: I.U.D.  Lifestyle  . Physical activity    Days per week: Not on file    Minutes per session: Not on file  . Stress: Not on file  Relationships  . Social Herbalist on phone: Not on file    Gets together: Not on file    Attends religious service: Not on file    Active member of club or organization: Not on file    Attends meetings of clubs or organizations: Not on file    Relationship status: Not on file  . Intimate partner violence  Fear of current or ex partner: Not on file    Emotionally abused: Not on file    Physically abused: Not on file    Forced sexual activity: Not on file  Other Topics Concern  . Not on file  Social History Narrative  . Not on file     Constitutional: Pt reports headache.  Denies fever, malaise, fatigue,or abrupt weight changes.  HEENT: Denies eye pain, eye redness, ear pain, ringing in the ears, wax buildup, bloody nose, or sore throat. Respiratory: Positive for cough associated to PND.  Denies difficulty breathing, shortness of breath. Cardiovascular: Denies chest pain, chest tightness, palpitations or swelling in the hands or feet.    No other specific complaints in a complete review of systems (except as listed in HPI above).  Objective:   Physical Exam   BP 140/86   Pulse 93   Temp 97.9 F (36.6 C) (Oral)   Wt (!) 350 lb (158.8 kg)    SpO2 97%   BMI 58.69 kg/m    Wt Readings from Last 3 Encounters:  03/24/19 (!) 353 lb (160.1 kg)  11/04/18 (!) 316 lb (143.3 kg)  01/28/18 (!) 316 lb (143.3 kg)    General: Appears her stated age, obese, in NAD. Cardiovascular: Normal rate and rhythm. S1,S2 noted.  No murmur, rubs or gallops noted. No JVD or BLE edema.  Pulmonary/Chest: Normal effort and positive vesicular breath sounds. No respiratory distress. No wheezes, rales or ronchi noted.      BMET    Component Value Date/Time   NA 133 (L) 10/18/2017 1746   K 4.4 10/18/2017 1746   CL 99 (L) 10/18/2017 1746   CO2 26 10/18/2017 1746   GLUCOSE 123 (H) 10/18/2017 1746   BUN 11 10/18/2017 1746   CREATININE 0.94 10/18/2017 1746   CALCIUM 9.0 10/18/2017 1746   GFRNONAA >60 10/18/2017 1746   GFRAA >60 10/18/2017 1746    Lipid Panel  No results found for: CHOL, TRIG, HDL, CHOLHDL, VLDL, LDLCALC  CBC    Component Value Date/Time   WBC 7.1 10/18/2017 1746   RBC 5.24 (H) 10/18/2017 1746   HGB 13.8 10/18/2017 1746   HGB 13.2 10/06/2012 0955   HCT 43.0 10/18/2017 1746   HCT 42.0 10/06/2012 0955   PLT 229 10/18/2017 1746   PLT 309 10/06/2012 0955   MCV 82.2 10/18/2017 1746   MCV 79 (L) 10/06/2012 0955   MCH 26.3 10/18/2017 1746   MCHC 32.0 10/18/2017 1746   RDW 15.4 (H) 10/18/2017 1746   RDW 15.1 (H) 10/06/2012 0955   LYMPHSABS 0.6 (L) 10/18/2017 1746   MONOABS 0.3 10/18/2017 1746   EOSABS 0.0 10/18/2017 1746   BASOSABS 0.0 10/18/2017 1746    Hgb A1C No results found for: HGBA1C         Assessment & Plan:

## 2019-04-07 NOTE — Telephone Encounter (Signed)
Patient was given code to contact medicaid with to see if they will cover test that patient is needing, Patient stated she called medicaid and they could not pull anything up with the code given. They stated that our office could enter the codes to see if they were covered by medicaid.  Patient's phone number- [phone]

## 2019-04-07 NOTE — Telephone Encounter (Signed)
I ordered labs. I did not get any denial of payment when I ordered however that is not a guarantee that Medicaid will pay for these lab tests. If she wants to proceed with the understanding that she may receive a bill, she can make a lab only appt to have these labs drawn.

## 2019-04-07 NOTE — Addendum Note (Signed)
Addended by: Jearld Fenton on: 04/07/2019 08:24 PM   Modules accepted: Orders

## 2019-04-07 NOTE — Patient Instructions (Signed)

## 2019-04-08 ENCOUNTER — Other Ambulatory Visit: Payer: Self-pay | Admitting: Internal Medicine

## 2019-04-08 ENCOUNTER — Telehealth: Payer: Self-pay | Admitting: Internal Medicine

## 2019-04-08 MED ORDER — LOSARTAN POTASSIUM-HCTZ 50-12.5 MG PO TABS: 0.5000 | ORAL_TABLET | Freq: Every day | ORAL | 0 refills | Status: DC

## 2019-04-08 NOTE — Telephone Encounter (Signed)
This is a duplicate note. See previous phone note.

## 2019-04-08 NOTE — Telephone Encounter (Signed)
Best number [phone]  Pt called medicaid to see if they would pay for the test you wanted run.  Medicaid told her they could not give her any information.  pcp would have to call them or look it up on medicaid website to see if they would pay

## 2019-04-09 NOTE — Addendum Note (Signed)
Addended by: Lurlean Nanny on: 04/09/2019 05:26 PM   Modules accepted: Orders

## 2019-04-10 NOTE — Telephone Encounter (Signed)
Pt is schedule to come in for a POC A1C next week

## 2019-04-13 ENCOUNTER — Other Ambulatory Visit (INDEPENDENT_AMBULATORY_CARE_PROVIDER_SITE_OTHER): Payer: Medicaid Other

## 2019-04-13 DIAGNOSIS — R7309 Other abnormal glucose: Secondary | ICD-10-CM | POA: Diagnosis not present

## 2019-04-13 LAB — POCT GLYCOSYLATED HEMOGLOBIN (HGB A1C): Hemoglobin A1C: 10.4 % — AB (ref 4.0–5.6)

## 2019-04-16 ENCOUNTER — Ambulatory Visit (INDEPENDENT_AMBULATORY_CARE_PROVIDER_SITE_OTHER): Payer: Medicaid Other | Admitting: Internal Medicine

## 2019-04-16 DIAGNOSIS — E1165 Type 2 diabetes mellitus with hyperglycemia: Secondary | ICD-10-CM | POA: Diagnosis not present

## 2019-04-16 NOTE — Progress Notes (Signed)
Virtual Visit via Video Note  I connected with Holly Garner on 04/16/19 at  3:45 PM EDT by a video enabled telemedicine application and verified that I am speaking with the correct person using two identifiers.  Location: Patient: Home Provider: Office   I discussed the limitations of evaluation and management by telemedicine and the availability of in person appointments. The patient expressed understanding and agreed to proceed.  History of Present Illness:   Pt needs to follow up labs. She has an A1C of 10.4%, 03/2019. She has never been told she has diabetes before. She denies blurred vision, increased thirst, urinary frequency, non healing wounds or numbness/tingling in her hands and feet. She is on Losartan HCT for HTN. She does not have a family history of diabetes.  Breakfast: muffin or chicken biscuit Lunch: sandwich or pizza Dinner: sandwich or pizza Snack: cookie, cake  Exercise: none  Past Medical History:  Diagnosis Date  . History of Papanicolaou smear of cervix 04/2011   normal per pt.  . No pertinent past medical history   . 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 albuterol (PROVENTIL HFA;VENTOLIN HFA) 108 (90 Base) MCG/ACT inhaler Inhale 2 puffs into the lungs every 6 (six) hours as needed for wheezing or shortness of breath. 1 Inhaler 0  . cetirizine (ZYRTEC ALLERGY) 10 MG tablet Take 1 tablet (10 mg total) by mouth daily. 30 tablet 1  . EPINEPHrine (EPIPEN 2-PAK) 0.3 mg/0.3 mL IJ SOAJ injection Inject 0.3 mLs (0.3 mg total) into the muscle once. 2 Device 0  . levonorgestrel (MIRENA) 20 MCG/24HR IUD 1 each by Intrauterine route once.    Marland Kitchen. losartan-hydrochlorothiazide (HYZAAR) 50-12.5 MG tablet Take 0.5 tablets by mouth daily. 30 tablet 0  . predniSONE (DELTASONE) 10 MG tablet 1 tablet daily (Patient taking differently: 10 mg daily as needed. For swelling) 10 tablet 0    No current facility-administered medications for this visit.     Allergies  Allergen Reactions  . Malt   . Milk-Related Compounds   . Shellfish Allergy   . Soy Allergy     Family History  Problem Relation Age of Onset  . Breast cancer Maternal Grandmother     Social History   Socioeconomic History  . Marital status: Married    Spouse name: Not on file  . Number of children: Not on file  . Years of education: 4712  . Highest education level: Not on file  Occupational History  . Occupation: unemployed  . Occupation: homemaker  Social Needs  . Financial resource strain: Not on file  . Food insecurity    Worry: Not on file    Inability: Not on file  . Transportation needs    Medical: Not on file    Non-medical: Not on file  Tobacco Use  . Smoking status: Current Every Day Smoker    Packs/day: 1.00    Years: 10.00    Pack years: 10.00    Types: Cigarettes  . Smokeless tobacco: Never Used  Substance and Sexual Activity  . Alcohol use: No  . Drug use: No  . Sexual activity: Yes    Birth control/protection: I.U.D.  Lifestyle  . Physical activity    Days per week: Not on file    Minutes per session: Not on file  . Stress: Not on file  Relationships  . Social Musicianconnections    Talks on phone:  Not on file    Gets together: Not on file    Attends religious service: Not on file    Active member of club or organization: Not on file    Attends meetings of clubs or organizations: Not on file    Relationship status: Not on file  . Intimate partner violence    Fear of current or ex partner: Not on file    Emotionally abused: Not on file    Physically abused: Not on file    Forced sexual activity: Not on file  Other Topics Concern  . Not on file  Social History Narrative  . Not on file     Constitutional: Denies fever, malaise, fatigue, headache or abrupt weight changes.  Respiratory: Denies difficulty breathing, shortness of breath, cough or sputum production.    Cardiovascular: Denies chest pain, chest tightness, palpitations or swelling in the hands or feet.  GU: Denies urgency, frequency, pain with urination, burning sensation, blood in urine, odor or discharge. Skin: Denies ulcercations.    No other specific complaints in a complete review of systems (except as listed in HPI above).   Wt Readings from Last 3 Encounters:  04/07/19 (!) 350 lb (158.8 kg)  03/24/19 (!) 353 lb (160.1 kg)  11/04/18 (!) 316 lb (143.3 kg)    General: Appears her stated age, obese, in NAD. Skin: Warm, dry and intact. No ulcerations noted. Pulmonary/Chest: Normal effort. No respiratory distress. Neurological: Alert and oriented.    BMET    Component Value Date/Time   NA 136 04/07/2019 1117   K 4.4 04/07/2019 1117   CL 99 04/07/2019 1117   CO2 31 04/07/2019 1117   GLUCOSE 204 (H) 04/07/2019 1117   BUN 9 04/07/2019 1117   CREATININE 0.58 04/07/2019 1117   CALCIUM 8.9 04/07/2019 1117   GFRNONAA >60 10/18/2017 1746   GFRAA >60 10/18/2017 1746    Lipid Panel  No results found for: CHOL, TRIG, HDL, CHOLHDL, VLDL, LDLCALC  CBC    Component Value Date/Time   WBC 7.1 10/18/2017 1746   RBC 5.24 (H) 10/18/2017 1746   HGB 13.8 10/18/2017 1746   HGB 13.2 10/06/2012 0955   HCT 43.0 10/18/2017 1746   HCT 42.0 10/06/2012 0955   PLT 229 10/18/2017 1746   PLT 309 10/06/2012 0955   MCV 82.2 10/18/2017 1746   MCV 79 (L) 10/06/2012 0955   MCH 26.3 10/18/2017 1746   MCHC 32.0 10/18/2017 1746   RDW 15.4 (H) 10/18/2017 1746   RDW 15.1 (H) 10/06/2012 0955   LYMPHSABS 0.6 (L) 10/18/2017 1746   MONOABS 0.3 10/18/2017 1746   EOSABS 0.0 10/18/2017 1746   BASOSABS 0.0 10/18/2017 1746    Hgb A1C Lab Results  Component Value Date   HGBA1C 10.4 (A) 04/13/2019         Assessment and Plan: See problem based charting  Follow Up Instructions:    I discussed the assessment and treatment plan with the patient. The patient was provided an opportunity to ask  questions and all were answered. The patient agreed with the plan and demonstrated an understanding of the instructions.   The patient was advised to call back or seek an in-person evaluation if the symptoms worsen or if the condition fails to improve as anticipated.    Webb Silversmith, NP

## 2019-04-16 NOTE — Addendum Note (Signed)
Addended by: Lurlean Nanny on: 04/16/2019 03:57 PM   Modules accepted: Orders

## 2019-04-17 ENCOUNTER — Encounter: Payer: Self-pay | Admitting: Internal Medicine

## 2019-04-17 DIAGNOSIS — E119 Type 2 diabetes mellitus without complications: Secondary | ICD-10-CM | POA: Insufficient documentation

## 2019-04-17 DIAGNOSIS — E1169 Type 2 diabetes mellitus with other specified complication: Secondary | ICD-10-CM | POA: Insufficient documentation

## 2019-04-17 NOTE — Assessment & Plan Note (Signed)
New onset Discussed diabetes and standards of medical care Discussed low carb diet, exercise for weight loss Advised to start Metformin 1000 mg BID, she declines at this time, wants to try 3 months of lifestyle modifications first Discussed risk of heart attack or stroke- she understands Discussed the importance of foot exams, routine dilated eye exams, flu and pneumonia vaccine and regular check ups Offered meter, strips and lancets so she could monitor her sugars- she declines Offered referral to diabetes education and nutrition, she declines

## 2019-04-17 NOTE — Patient Instructions (Signed)
Diabetes Mellitus and Standards of Medical Care Managing diabetes (diabetes mellitus) can be complicated. Your diabetes treatment may be managed by a team of health care providers, including:  A physician who specializes in diabetes (endocrinologist).  A nurse practitioner or physician assistant.  Nurses.  A diet and nutrition specialist (registered dietitian).  A certified diabetes educator (CDE).  An exercise specialist.  A pharmacist.  An eye doctor.  A foot specialist (podiatrist).  A dentist.  A primary care provider.  A mental health provider. Your health care providers follow guidelines to help you get the best quality of care. The following schedule is a general guideline for your diabetes management plan. Your health care providers may give you more specific instructions. Physical exams Upon being diagnosed with diabetes mellitus, and each year after that, your health care provider will ask about your medical and family history. He or she will also do a physical exam. Your exam may include:  Measuring your height, weight, and body mass index (BMI).  Checking your blood pressure. This will be done at every routine medical visit. Your target blood pressure may vary depending on your medical conditions, your age, and other factors.  Thyroid gland exam.  Skin exam.  Screening for damage to your nerves (peripheral neuropathy). This may include checking the pulse in your legs and feet and checking the level of sensation in your hands and feet.  A complete foot exam to inspect the structure and skin of your feet, including checking for cuts, bruises, redness, blisters, sores, or other problems.  Screening for blood vessel (vascular) problems, which may include checking the pulse in your legs and feet and checking your temperature. Blood tests Depending on your treatment plan and your personal needs, you may have the following tests done:  HbA1c (hemoglobin A1c). This  test provides information about blood sugar (glucose) control over the previous 2-3 months. It is used to adjust your treatment plan, if needed. This test will be done: ? At least 2 times a year, if you are meeting your treatment goals. ? 4 times a year, if you are not meeting your treatment goals or if treatment goals have changed.  Lipid testing, including total, LDL, and HDL cholesterol and triglyceride levels. ? The goal for LDL is less than 100 mg/dL (5.5 mmol/L). If you are at high risk for complications, the goal is less than 70 mg/dL (3.9 mmol/L). ? The goal for HDL is 40 mg/dL (2.2 mmol/L) or higher for men and 50 mg/dL (2.8 mmol/L) or higher for women. An HDL cholesterol of 60 mg/dL (3.3 mmol/L) or higher gives some protection against heart disease. ? The goal for triglycerides is less than 150 mg/dL (8.3 mmol/L).  Liver function tests.  Kidney function tests.  Thyroid function tests. Dental and eye exams  Visit your dentist two times a year.  If you have type 1 diabetes, your health care provider may recommend an eye exam 3-5 years after you are diagnosed, and then once a year after your first exam. ? For children with type 1 diabetes, a health care provider may recommend an eye exam when your child is age 10 or older and has had diabetes for 3-5 years. After the first exam, your child should get an eye exam once a year.  If you have type 2 diabetes, your health care provider may recommend an eye exam as soon as you are diagnosed, and then once a year after your first exam. Immunizations   The   yearly flu (influenza) vaccine is recommended for everyone 6 months or older who has diabetes.  The pneumonia (pneumococcal) vaccine is recommended for everyone 2 years or older who has diabetes. If you are 65 or older, you may get the pneumonia vaccine as a series of two separate shots.  The hepatitis B vaccine is recommended for adults shortly after being diagnosed with diabetes.   Adults and children with diabetes should receive all other vaccines according to age-specific recommendations from the Centers for Disease Control and Prevention (CDC). Mental and emotional health Screening for symptoms of eating disorders, anxiety, and depression is recommended at the time of diagnosis and afterward as needed. If your screening shows that you have symptoms (positive screening result), you may need more evaluation and you may work with a mental health care provider. Treatment plan Your treatment plan will be reviewed at every medical visit. You and your health care provider will discuss:  How you are taking your medicines, including insulin.  Any side effects you are experiencing.  Your blood glucose target goals.  The frequency of your blood glucose monitoring.  Lifestyle habits, such as activity level as well as tobacco, alcohol, and substance use. Diabetes self-management education Your health care provider will assess how well you are monitoring your blood glucose levels and whether you are taking your insulin correctly. He or she may refer you to:  A certified diabetes educator to manage your diabetes throughout your life, starting at diagnosis.  A registered dietitian who can create or review your personal nutrition plan.  An exercise specialist who can discuss your activity level and exercise plan. Summary  Managing diabetes (diabetes mellitus) can be complicated. Your diabetes treatment may be managed by a team of health care providers.  Your health care providers follow guidelines in order to help you get the best quality of care.  Standards of care including having regular physical exams, blood tests, blood pressure monitoring, immunizations, screening tests, and education about how to manage your diabetes.  Your health care providers may also give you more specific instructions based on your individual health. This information is not intended to replace  advice given to you by your health care provider. Make sure you discuss any questions you have with your health care provider. Document Released: 08/05/2009 Document Revised: 06/27/2018 Document Reviewed: 07/06/2016 [employer]vier Patient Education  2020 [employer]  

## 2019-04-23 ENCOUNTER — Ambulatory Visit (INDEPENDENT_AMBULATORY_CARE_PROVIDER_SITE_OTHER): Payer: Medicaid Other | Admitting: Obstetrics and Gynecology

## 2019-04-23 ENCOUNTER — Encounter: Payer: Self-pay | Admitting: Obstetrics and Gynecology

## 2019-04-23 ENCOUNTER — Other Ambulatory Visit: Payer: Self-pay

## 2019-04-23 ENCOUNTER — Other Ambulatory Visit (HOSPITAL_COMMUNITY)
Admission: RE | Admit: 2019-04-23 | Discharge: 2019-04-23 | Disposition: A | Discharge status: Home / Self Care | Payer: Medicaid Other | Source: Ambulatory Visit | Attending: Obstetrics and Gynecology | Admitting: Obstetrics and Gynecology

## 2019-04-23 VITALS — BP 120/90 | Ht 65.0 in | Wt 343.0 lb

## 2019-04-23 DIAGNOSIS — Z1151 Encounter for screening for human papillomavirus (HPV): Secondary | ICD-10-CM | POA: Insufficient documentation

## 2019-04-23 DIAGNOSIS — Z01419 Encounter for gynecological examination (general) (routine) without abnormal findings: Secondary | ICD-10-CM

## 2019-04-23 DIAGNOSIS — Z30432 Encounter for removal of intrauterine contraceptive device: Secondary | ICD-10-CM

## 2019-04-23 DIAGNOSIS — Z124 Encounter for screening for malignant neoplasm of cervix: Secondary | ICD-10-CM | POA: Diagnosis present

## 2019-04-23 DIAGNOSIS — R87810 Cervical high risk human papillomavirus (HPV) DNA test positive: Secondary | ICD-10-CM | POA: Insufficient documentation

## 2019-04-23 DIAGNOSIS — Z0000 Encounter for general adult medical examination without abnormal findings: Secondary | ICD-10-CM | POA: Diagnosis not present

## 2019-04-23 NOTE — Progress Notes (Signed)
Chief Complaint  Patient presents with  . Gynecologic Exam  . Contraception    IUD removal     HPI:      [name] is a 38 y.o. [phone] who LMP was No LMP recorded. (Menstrual status: IUD)., presents today for her annual examination.  Her menses are absent due to IUD. Dysmenorrhea none. She does not have intermenstrual bleeding. She is ready to have IUD removed today. Menses were monthly, lasting 4-5 days before IUD.  Sex activity: single partner, contraception - vasectomy.  IUD Mirena placed 12/07/13. Last Pap: 01/20/17 Results: no abnormalities/POS HPV DNA. Repeat pap due today.   There is a FH of breast cancer in her MGM, genetic testing not indicated. There is no FH of ovarian cancer. The patient does do self-breast exams.  Tobacco use: The patient currently smokes 1/2 packs of cigarettes per day for the past several years. Alcohol use: none  Drug use: none Exercise: min active  She does get adequate calcium but not Vitamin D in her diet. Labs with PCP.   Past Medical History:  Diagnosis Date  . BMI 50.0-59.9, adult ([facility])   . History of Papanicolaou smear of cervix 04/2011   normal per pt.  . 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 COLPOSCOPY  09/28/2009  . TONSILLECTOMY AND ADENOIDECTOMY    . TYMPANOSTOMY TUBE PLACEMENT Bilateral     Family History  Problem Relation Age of Onset  . Breast cancer Maternal Grandmother     Social History   Socioeconomic History  . Marital status: Married    Spouse name: Not on file  . Number of children: Not on file  . Years of education: 7712  . Highest education level: Not on file  Occupational History  . Occupation: unemployed  . Occupation: homemaker  Social Needs  . Financial resource strain: Not on file  . Food insecurity    Worry: Not on file    Inability: Not on file  . Transportation needs    Medical: Not on file     Non-medical: Not on file  Tobacco Use  . Smoking status: Current Every Day Smoker    Packs/day: 1.00    Years: 10.00    Pack years: 10.00    Types: Cigarettes  . Smokeless tobacco: Never Used  Substance and Sexual Activity  . Alcohol use: No  . Drug use: No  . Sexual activity: Yes    Birth control/protection: Surgical    Comment: Vasectomy  Lifestyle  . Physical activity    Days per week: Not on file    Minutes per session: Not on file  . Stress: Not on file  Relationships  . Social Musicianconnections    Talks on phone: Not on file    Gets together: Not on file    Attends religious service: Not on file    Active member of club or organization: Not on file    Attends meetings of clubs or organizations: Not on file    Relationship status: Not on file  . Intimate partner violence    Fear of current or ex partner: Not on file    Emotionally abused: Not on file    Physically abused: Not on file    Forced sexual activity: Not on file  Other Topics Concern  . Not on file  Social History Narrative  . Not on file  Current Outpatient Medications:  .  albuterol (PROVENTIL HFA;VENTOLIN HFA) 108 (90 Base) MCG/ACT inhaler, Inhale 2 puffs into the lungs every 6 (six) hours as needed for wheezing or shortness of breath., Disp: 1 Inhaler, Rfl: 0 .  cetirizine (ZYRTEC ALLERGY) 10 MG tablet, Take 1 tablet (10 mg total) by mouth daily., Disp: 30 tablet, Rfl: 1 .  EPINEPHrine (EPIPEN 2-PAK) 0.3 mg/0.3 mL IJ SOAJ injection, Inject 0.3 mLs (0.3 mg total) into the muscle once., Disp: 2 Device, Rfl: 0 .  losartan-hydrochlorothiazide (HYZAAR) 50-12.5 MG tablet, Take 0.5 tablets by mouth daily., Disp: 30 tablet, Rfl: 0 .  predniSONE (DELTASONE) 10 MG tablet, 1 tablet daily (Patient taking differently: 10 mg daily as needed. For swelling), Disp: 10 tablet, Rfl: 0  ROS:  Review of Systems  Constitutional: Negative for fever, malaise/fatigue and weight loss.  HENT: Negative for congestion, ear pain  and sinus pain.   Respiratory: Negative for cough, shortness of breath and wheezing.   Cardiovascular: Negative for chest pain, orthopnea and leg swelling.  Gastrointestinal: Negative for constipation, diarrhea, nausea and vomiting.  Genitourinary: Positive for frequency. Negative for dysuria, hematuria and urgency.       Breast ROS: negative   Musculoskeletal: Negative for back pain, joint pain and myalgias.  Skin: Negative for itching and rash.  Neurological: Negative for dizziness, tingling, focal weakness and headaches.  Endo/Heme/Allergies: Negative for environmental allergies. Does not bruise/bleed easily.  Psychiatric/Behavioral: Negative for depression and suicidal ideas. The patient is not nervous/anxious and does not have insomnia.     Objective: BP 120/90   Ht 5\' 5"  (1.651 m)   Wt (!) 343 lb (155.6 kg)   Breastfeeding No   BMI 57.08 kg/m    Physical Exam Constitutional:      Appearance: She is well-developed.  Genitourinary:     Vulva, vagina, uterus, right adnexa and left adnexa normal.     No vulval lesion or tenderness noted.     No vaginal discharge, erythema or tenderness.     No cervical motion tenderness or polyp.     IUD strings visualized.     Uterus is not enlarged or tender.     No right or left adnexal mass present.     Right adnexa not tender.     Left adnexa not tender.  Neck:     Musculoskeletal: Normal range of motion.     Thyroid: No thyromegaly.  Cardiovascular:     Rate and Rhythm: Normal rate and regular rhythm.     Heart sounds: Normal heart sounds. No murmur.  Pulmonary:     Effort: Pulmonary effort is normal.     Breath sounds: Normal breath sounds.  Chest:     Breasts:        Right: No mass, nipple discharge, skin change or tenderness.        Left: No mass, nipple discharge, skin change or tenderness.  Abdominal:     Palpations: Abdomen is soft.     Tenderness: There is no abdominal tenderness. There is no guarding.   Musculoskeletal: Normal range of motion.  Neurological:     General: No focal deficit present.     Mental Status: She is alert and oriented to person, place, and time.     Cranial Nerves: No cranial nerve deficit.  Skin:    General: Skin is warm and dry.  Psychiatric:        Mood and Affect: Mood normal.        Behavior:  Behavior normal.        Thought Content: Thought content normal.        Judgment: Judgment normal.  Vitals signs reviewed.    IUD Removal Strings of IUD identified and grasped.  IUD removed without problem with Boseman forceps.  Pt tolerated this well.  IUD noted to be intact.   Assessment/Plan: Encounter for annual routine gynecological examination -   Cervical cancer screening - Plan: Cytology - PAP,   Screening for HPV (human papillomavirus) - Plan: Cytology - PAP,   Cervical high risk human papillomavirus (HPV) DNA test positive - Plan: Cytology - PAP, Will call with results. If abn, will do colpo.  Encounter for removal of intrauterine contraceptive device (IUD) - Plan: IUD removed. Pt tolerated well.             GYN counsel adequate intake of calcium and vitamin D, diet and exercise     F/U  Return in about 1 year (around 04/22/2020).  Jubal Rademaker B. Natajah Derderian, PA-C 04/23/2019 4:08 PM

## 2019-04-23 NOTE — Patient Instructions (Signed)
I value your feedback and entrusting us with your care. If you get a [facility] patient survey, I would appreciate you taking the time to let us know about your experience today. Thank you! 

## 2019-04-27 ENCOUNTER — Ambulatory Visit: Payer: Medicaid Other | Admitting: Internal Medicine

## 2019-04-28 ENCOUNTER — Other Ambulatory Visit: Payer: Self-pay

## 2019-04-28 ENCOUNTER — Ambulatory Visit: Payer: Medicaid Other | Admitting: Primary Care

## 2019-04-28 ENCOUNTER — Other Ambulatory Visit (INDEPENDENT_AMBULATORY_CARE_PROVIDER_SITE_OTHER): Payer: Medicaid Other

## 2019-04-28 ENCOUNTER — Ambulatory Visit (INDEPENDENT_AMBULATORY_CARE_PROVIDER_SITE_OTHER)
Admission: RE | Admit: 2019-04-28 | Discharge: 2019-04-28 | Disposition: A | Discharge status: Home / Self Care | Payer: Medicaid Other | Source: Ambulatory Visit | Attending: Primary Care | Admitting: Primary Care

## 2019-04-28 ENCOUNTER — Encounter: Payer: Self-pay | Admitting: Primary Care

## 2019-04-28 ENCOUNTER — Ambulatory Visit: Payer: Medicaid Other | Admitting: Internal Medicine

## 2019-04-28 DIAGNOSIS — I10 Essential (primary) hypertension: Secondary | ICD-10-CM

## 2019-04-28 DIAGNOSIS — M25561 Pain in right knee: Secondary | ICD-10-CM | POA: Diagnosis not present

## 2019-04-28 DIAGNOSIS — R609 Edema, unspecified: Secondary | ICD-10-CM

## 2019-04-28 DIAGNOSIS — M25569 Pain in unspecified knee: Secondary | ICD-10-CM | POA: Insufficient documentation

## 2019-04-28 DIAGNOSIS — R7982 Elevated C-reactive protein (CRP): Secondary | ICD-10-CM

## 2019-04-28 LAB — TSH: TSH: 1.19 u[IU]/mL (ref 0.35–4.50)

## 2019-04-28 LAB — CBC
HCT: 43.3 % (ref 36.0–46.0)
Hemoglobin: 13.7 g/dL (ref 12.0–15.0)
MCHC: 31.6 g/dL (ref 30.0–36.0)
MCV: 83.9 fl (ref 78.0–100.0)
Platelets: 338 10*3/uL (ref 150.0–400.0)
RBC: 5.16 Mil/uL — ABNORMAL HIGH (ref 3.87–5.11)
RDW: 15.6 % — ABNORMAL HIGH (ref 11.5–15.5)
WBC: 12.7 10*3/uL — ABNORMAL HIGH (ref 4.0–10.5)

## 2019-04-28 LAB — SEDIMENTATION RATE: Sed Rate: 29 mm/hr — ABNORMAL HIGH (ref 0–20)

## 2019-04-28 LAB — LIPID PANEL
Cholesterol: 226 mg/dL — ABNORMAL HIGH (ref 0–200)
HDL: 29.4 mg/dL — ABNORMAL LOW (ref >39.00)
LDL Cholesterol: 160 mg/dL — ABNORMAL HIGH (ref 0–99)
NonHDL: 197
Total CHOL/HDL Ratio: 8
Triglycerides: 185 mg/dL — ABNORMAL HIGH (ref 0.0–149.0)
VLDL: 37 mg/dL (ref 0.0–40.0)

## 2019-04-28 LAB — HEPATIC FUNCTION PANEL
ALT: 26 U/L (ref 0–35)
AST: 17 U/L (ref 0–37)
Albumin: 4 g/dL (ref 3.5–5.2)
Alkaline Phosphatase: 121 U/L — ABNORMAL HIGH (ref 39–117)
Bilirubin, Direct: 0.1 mg/dL (ref 0.0–0.3)
Total Bilirubin: 0.5 mg/dL (ref 0.2–1.2)
Total Protein: 6.7 g/dL (ref 6.0–8.3)

## 2019-04-28 LAB — HIGH SENSITIVITY CRP: CRP, High Sensitivity: 44.6 mg/L — ABNORMAL HIGH (ref 0.000–5.000)

## 2019-04-28 MED ORDER — NAPROXEN 500 MG PO TABS: 500.0000 mg | ORAL_TABLET | Freq: Two times a day (BID) | ORAL | 0 refills | Status: DC

## 2019-04-28 NOTE — Progress Notes (Signed)
Subjective:    Patient ID: Holly Garner, female    DOB: [birth year], 38 y.o.   MRN: [id number]  HPI  Holly Garner is a 38 year old female with a history of type 2 diabetes, hypertension, morbid obesity who presents today with a chief complaint of knee pain and for BP check.  She is currently managed on losartan-HCTZ 50/12.5 mg daily which was initiated on 04/07/19. Today she endorses that she's not yet started her mediation as she was confused about the 0.5 tablet dose instructions.   She was last evaluated on 04/17/19 per her PCP and diagnosed with type 2 diabetes. It was recommended that she start Metformin 1000 mg BID for A1C of 10.4 but she declined as she wanted to work on lifestyle changes.   She was evaluated by her OB/GYN on 04/23/19, BP noted to be above goal. She does not check her BP at home.   BP Readings from Last 3 Encounters:  04/28/19 (!) 146/94  04/23/19 120/90  04/07/19 140/86   Last night she tripped and fell over a pool float in her house.  When she fell her right lower extremity went to the right out in front of her and "popped". She was able to get up and stand but had pain with mild pressure to right lower extremity. She's noticed swelling to the knee with continued pain. She's taken hydrocodone-acetaminophen last night and this morning with some improvement in pain. She had this left over from an older prescription. She denies numbness/tingling, discoloration. She did not hit her head.  Review of Systems  Musculoskeletal: Positive for joint swelling.       Acute right knee pain  Skin: Negative for color change.  Neurological: Negative for headaches.       Past Medical History:  Diagnosis Date  . BMI 50.0-59.9, adult ([facility])   . History of Papanicolaou smear of cervix 04/2011   normal per pt.  . 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 Marital status: Married    Spouse name: Not on  file  . Number of children: Not on file  . Years of education: 74  . Highest education level: Not on file  Occupational History  . Occupation: unemployed  . Occupation: homemaker  Social Needs  . Financial resource strain: Not on file  . Food insecurity    Worry: Not on file    Inability: Not on file  . Transportation needs    Medical: Not on file    Non-medical: Not on file  Tobacco Use  . Smoking status: Current Every Day Smoker    Packs/day: 1.00    Years: 10.00    Pack years: 10.00    Types: Cigarettes  . Smokeless tobacco: Never Used  Substance and Sexual Activity  . Alcohol use: No  . Drug use: No  . Sexual activity: Yes    Birth control/protection: Surgical    Comment: Vasectomy  Lifestyle  . Physical activity    Days per week: Not on file    Minutes per session: Not on file  . Stress: Not on file  Relationships  . Social Herbalist on phone: Not on file    Gets together: Not on file    Attends religious service: Not on file    Active member of club or organization: Not on file    Attends meetings of clubs  or organizations: Not on file    Relationship status: Not on file  . Intimate partner violence    Fear of current or ex partner: Not on file    Emotionally abused: Not on file    Physically abused: Not on file    Forced sexual activity: Not on file  Other Topics Concern  . Not on file  Social History Narrative  . Not on file    Past Surgical History:  Procedure Laterality Date  . COLPOSCOPY  09/28/2009  . TONSILLECTOMY AND ADENOIDECTOMY    . TYMPANOSTOMY TUBE PLACEMENT Bilateral     Family History  Problem Relation Age of Onset  . Breast cancer Maternal Grandmother     Allergies  Allergen Reactions  . Malt   . Milk-Related Compounds   . Shellfish Allergy   . Soy Allergy     Current Outpatient Medications on File Prior to Visit  Medication Sig Dispense Refill  . albuterol (PROVENTIL HFA;VENTOLIN HFA) 108 (90 Base) MCG/ACT  inhaler Inhale 2 puffs into the lungs every 6 (six) hours as needed for wheezing or shortness of breath. 1 Inhaler 0  . cetirizine (ZYRTEC ALLERGY) 10 MG tablet Take 1 tablet (10 mg total) by mouth daily. 30 tablet 1  . EPINEPHrine (EPIPEN 2-PAK) 0.3 mg/0.3 mL IJ SOAJ injection Inject 0.3 mLs (0.3 mg total) into the muscle once. 2 Device 0  . losartan-hydrochlorothiazide (HYZAAR) 50-12.5 MG tablet Take 0.5 tablets by mouth daily. (Patient taking differently: Taking 1/2 tablet by mouth daily) 30 tablet 0  . predniSONE (DELTASONE) 10 MG tablet 1 tablet daily (Patient taking differently: 10 mg daily as needed. For swelling) 10 tablet 0   No current facility-administered medications on file prior to visit.     BP (!) 146/94   Pulse (!) 101   Temp 98.7 F (37.1 C) (Temporal)   Ht 5\' 5"  (1.651 m)   Wt (!) 322 lb 4 oz (146.2 kg)   SpO2 97%   BMI 53.63 kg/m    Objective:   Physical Exam  Constitutional: She appears well-nourished.  Respiratory: Effort normal.  Musculoskeletal:     Right knee: She exhibits decreased range of motion and swelling. She exhibits no erythema and no bony tenderness. Tenderness found. Medial joint line tenderness noted.       Legs:     Comments: Ambulatory in clinic with limp. 4/5 strength to right lower extremity due to pain with extension. Improved strength with flexion. No deformity.  Skin: No erythema.  Psychiatric: She has a normal mood and affect.           Assessment & Plan:

## 2019-04-28 NOTE — Patient Instructions (Signed)
Complete xray(s) prior to leaving today. I will notify you of your results once received.  Elevate and ice your knee as discussed for swelling and pain.  Remember to get up every 30-45 minutes to avoid stiffness.  You may take naproxen 500 mg tablets every 12 hours with food as needed for pain/inflammation.  Start taking one half tablet of the losartan-hydrochlorothiazide blood pressure medication.   Schedule a follow up visit with [facility] for 2 weeks for BP check.  It was a pleasure to see you today!

## 2019-04-28 NOTE — Assessment & Plan Note (Signed)
Since falling last night. Able to bear weight which is reassuring. Check plain films today. Discussed use of knee sleeve, elevation, ice. Rx for naproxen course sent to pharmacy for inflammation and swelling.  Discussed stretching.

## 2019-04-28 NOTE — Assessment & Plan Note (Signed)
Above goal in the office today, likely worse due to acute pain. Has not yet started losartan-HCTZ. Discussed to start and take 1/2 tablet daily as prescribed. Recommended she schedule a follow up visit with PCP in 2 weeks for BP check and BMP.

## 2019-04-29 LAB — ANA: Anti Nuclear Antibody (ANA): NEGATIVE

## 2019-04-29 LAB — RHEUMATOID FACTOR: Rhuematoid fact SerPl-aCnc: <14 IU/mL (ref <14)

## 2019-04-29 LAB — CYTOLOGY - PAP
Adequacy: ABSENT
Diagnosis: NEGATIVE
HPV: NOT DETECTED

## 2019-04-29 NOTE — Progress Notes (Signed)
Pt aware.

## 2019-04-29 NOTE — Progress Notes (Signed)
Pls let pt know pap is normal. F/u next yr. Thx

## 2019-05-01 NOTE — Addendum Note (Signed)
Addended by: Jearld Fenton on: 05/01/2019 09:22 PM   Modules accepted: Orders

## 2019-05-12 ENCOUNTER — Other Ambulatory Visit: Payer: Self-pay

## 2019-05-12 ENCOUNTER — Encounter: Payer: Self-pay | Admitting: Internal Medicine

## 2019-05-12 ENCOUNTER — Ambulatory Visit: Payer: Medicaid Other | Admitting: Internal Medicine

## 2019-05-12 VITALS — BP 142/86 | HR 100 | Temp 98.6°F | Wt 342.0 lb

## 2019-05-12 DIAGNOSIS — R609 Edema, unspecified: Secondary | ICD-10-CM | POA: Diagnosis not present

## 2019-05-12 DIAGNOSIS — I10 Essential (primary) hypertension: Secondary | ICD-10-CM

## 2019-05-12 DIAGNOSIS — R7982 Elevated C-reactive protein (CRP): Secondary | ICD-10-CM

## 2019-05-12 LAB — HIGH SENSITIVITY CRP: CRP, High Sensitivity: 21.3 mg/L — ABNORMAL HIGH (ref 0.000–5.000)

## 2019-05-12 MED ORDER — LOSARTAN POTASSIUM-HCTZ 50-12.5 MG PO TABS: 1.0000 | ORAL_TABLET | Freq: Every day | ORAL | 2 refills | Status: DC

## 2019-05-12 NOTE — Progress Notes (Signed)
Subjective:    Patient ID: Holly Garner, female    DOB: 01/04/1981, 38 y.o.   MRN: [id number]  HPI  Patient presents to the clinic today for a BP follow up.  Also due for follow up labs to assess elevated CRP levels.   HTN: Her BP today is 124/86. She is currently managed on Losartan/HCTZ as prescribed.  She tries to consume a low salt diet.  She does not check her BP at home because she does not have a meter. She denies CP, palpitations, swelling, cough.    Review of Systems      Past Medical History:  Diagnosis Date  . BMI 50.0-59.9, adult ([facility])   . History of Papanicolaou smear of cervix 04/2011   normal per pt.  . 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 albuterol (PROVENTIL HFA;VENTOLIN HFA) 108 (90 Base) MCG/ACT inhaler Inhale 2 puffs into the lungs every 6 (six) hours as needed for wheezing or shortness of breath. 1 Inhaler 0  . cetirizine (ZYRTEC ALLERGY) 10 MG tablet Take 1 tablet (10 mg total) by mouth daily. 30 tablet 1  . EPINEPHrine (EPIPEN 2-PAK) 0.3 mg/0.3 mL IJ SOAJ injection Inject 0.3 mLs (0.3 mg total) into the muscle once. 2 Device 0  . losartan-hydrochlorothiazide (HYZAAR) 50-12.5 MG tablet Take 0.5 tablets by mouth daily. (Patient taking differently: Taking 1/2 tablet by mouth daily) 30 tablet 0  . naproxen (NAPROSYN) 500 MG tablet Take 1 tablet (500 mg total) by mouth 2 (two) times daily with a meal. For pain. 20 tablet 0  . predniSONE (DELTASONE) 10 MG tablet 1 tablet daily (Patient taking differently: 10 mg daily as needed. For swelling) 10 tablet 0   No current facility-administered medications for this visit.     Allergies  Allergen Reactions  . Malt   . Milk-Related Compounds   . Shellfish Allergy   . Soy Allergy     Family History  Problem Relation Age of Onset  . Breast cancer Maternal Grandmother     Social History   Socioeconomic History   . Marital status: Married    Spouse name: Not on file  . Number of children: Not on file  . Years of education: 3512  . Highest education level: Not on file  Occupational History  . Occupation: unemployed  . Occupation: homemaker  Social Needs  . Financial resource strain: Not on file  . Food insecurity    Worry: Not on file    Inability: Not on file  . Transportation needs    Medical: Not on file    Non-medical: Not on file  Tobacco Use  . Smoking status: Current Every Day Smoker    Packs/day: 1.00    Years: 10.00    Pack years: 10.00    Types: Cigarettes  . Smokeless tobacco: Never Used  Substance and Sexual Activity  . Alcohol use: No  . Drug use: No  . Sexual activity: Yes    Birth control/protection: Surgical    Comment: Vasectomy  Lifestyle  . Physical activity    Days per week: Not on file    Minutes per session: Not on file  . Stress: Not on file  Relationships  . Social Musicianconnections    Talks on phone: Not on file    Gets together: Not on file    Attends religious service: Not on file    Active  member of club or organization: Not on file    Attends meetings of clubs or organizations: Not on file    Relationship status: Not on file  . Intimate partner violence    Fear of current or ex partner: Not on file    Emotionally abused: Not on file    Physically abused: Not on file    Forced sexual activity: Not on file  Other Topics Concern  . Not on file  Social History Narrative  . Not on file     Constitutional: Denies fever, malaise, fatigue, headache or abrupt weight changes.  Respiratory: Denies difficulty breathing, shortness of breath, cough or sputum production.   Cardiovascular: Denies chest pain, chest tightness, palpitations or swelling in the hands or feet.   No other specific complaints in a complete review of systems (except as listed in HPI above).  Objective:   Physical Exam BP (!) 142/86   Pulse 100   Temp 98.6 F (37 C) (Temporal)   Wt  (!) 342 lb (155.1 kg)   SpO2 96%   BMI 56.91 kg/m    BP (!) 142/86   Pulse 100   Temp 98.6 F (37 C) (Temporal)   Wt (!) 342 lb (155.1 kg)   SpO2 96%   BMI 56.91 kg/m   Wt Readings from Last 3 Encounters:  04/28/19 (!) 322 lb 4 oz (146.2 kg)  04/23/19 (!) 343 lb (155.6 kg)  04/07/19 (!) 350 lb (158.8 kg)    General: Appears her stated age, obese, in NAD. Cardiovascular: Normal rate and rhythm. S1,S2 noted.  No murmur, rubs or gallops noted. Pulmonary/Chest: Normal effort and positive vesicular breath sounds. No respiratory distress. No wheezes, rales or ronchi noted.   BMET    Component Value Date/Time   NA 136 04/07/2019 1117   K 4.4 04/07/2019 1117   CL 99 04/07/2019 1117   CO2 31 04/07/2019 1117   GLUCOSE 204 (H) 04/07/2019 1117   BUN 9 04/07/2019 1117   CREATININE 0.58 04/07/2019 1117   CALCIUM 8.9 04/07/2019 1117   GFRNONAA >60 10/18/2017 1746   GFRAA >60 10/18/2017 1746    Lipid Panel     Component Value Date/Time   CHOL 226 (H) 04/28/2019 1129   TRIG 185.0 (H) 04/28/2019 1129   HDL 29.40 (L) 04/28/2019 1129   CHOLHDL 8 04/28/2019 1129   VLDL 37.0 04/28/2019 1129   LDLCALC 160 (H) 04/28/2019 1129    CBC    Component Value Date/Time   WBC 12.7 (H) 04/28/2019 1129   RBC 5.16 (H) 04/28/2019 1129   HGB 13.7 04/28/2019 1129   HGB 13.2 10/06/2012 0955   HCT 43.3 04/28/2019 1129   HCT 42.0 10/06/2012 0955   PLT 338.0 04/28/2019 1129   PLT 309 10/06/2012 0955   MCV 83.9 04/28/2019 1129   MCV 79 (L) 10/06/2012 0955   MCH 26.3 10/18/2017 1746   MCHC 31.6 04/28/2019 1129   RDW 15.6 (H) 04/28/2019 1129   RDW 15.1 (H) 10/06/2012 0955   LYMPHSABS 0.6 (L) 10/18/2017 1746   MONOABS 0.3 10/18/2017 1746   EOSABS 0.0 10/18/2017 1746   BASOSABS 0.0 10/18/2017 1746    Hgb A1C Lab Results  Component Value Date   HGBA1C 10.4 (A) 04/13/2019            Assessment & Plan:    Elevated CRP, Swelling:  Recheck CRP levels today. If still elevated,  may consider referral to Rheumatology.  HTN:  Increase Losartan/HCTZ from 0.5 tab  daily to 1 tab daily and recheck BP in 2 weeks. Reinforced DASH diet and increasing exercise to lose weight.  Will follow up after labs, return precautions discussed Webb Silversmith, NP

## 2019-05-12 NOTE — Patient Instructions (Signed)

## 2019-05-13 NOTE — Addendum Note (Signed)
Addended by: Jearld Fenton on: 05/13/2019 11:17 AM   Modules accepted: Orders

## 2019-05-22 ENCOUNTER — Encounter: Payer: Self-pay | Admitting: Obstetrics and Gynecology

## 2019-05-26 ENCOUNTER — Encounter: Payer: Self-pay | Admitting: Internal Medicine

## 2019-05-26 ENCOUNTER — Other Ambulatory Visit: Payer: Self-pay

## 2019-05-26 ENCOUNTER — Ambulatory Visit (INDEPENDENT_AMBULATORY_CARE_PROVIDER_SITE_OTHER): Payer: Medicaid Other | Admitting: Internal Medicine

## 2019-05-26 VITALS — BP 136/80 | HR 88 | Temp 98.2°F | Wt 333.0 lb

## 2019-05-26 DIAGNOSIS — I10 Essential (primary) hypertension: Secondary | ICD-10-CM | POA: Diagnosis not present

## 2019-05-26 MED ORDER — LOSARTAN POTASSIUM-HCTZ 50-12.5 MG PO TABS: 2.0000 | ORAL_TABLET | Freq: Every day | ORAL | 2 refills | Status: DC

## 2019-05-26 NOTE — Progress Notes (Signed)
Subjective:    Patient ID: Holly Garner, female    DOB: 02/01/1981, 38 y.o.   MRN: [id number]  HPI  Patient presents to the clinic today for a 2 week BP follow up.  She is taking her Losartan/HCTZ as directed.  She only had 1/2 of a pill left to take this morning.  She denies any issues with the medication.  Her BP today is 136/80.  She has also recently lost approximately 9 lbs.  She stopped drinking sodas and is eating smaller portion sizes at meals.   Review of Systems      Past Medical History:  Diagnosis Date  . BMI 50.0-59.9, adult ([place])   . History of Papanicolaou smear of cervix 04/2011   normal per pt.  . 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 albuterol (PROVENTIL HFA;VENTOLIN HFA) 108 (90 Base) MCG/ACT inhaler Inhale 2 puffs into the lungs every 6 (six) hours as needed for wheezing or shortness of breath. 1 Inhaler 0  . cetirizine (ZYRTEC ALLERGY) 10 MG tablet Take 1 tablet (10 mg total) by mouth daily. 30 tablet 1  . EPINEPHrine (EPIPEN 2-PAK) 0.3 mg/0.3 mL IJ SOAJ injection Inject 0.3 mLs (0.3 mg total) into the muscle once. 2 Device 0  . losartan-hydrochlorothiazide (HYZAAR) 50-12.5 MG tablet Take 1 tablet by mouth daily. 30 tablet 2  . naproxen (NAPROSYN) 500 MG tablet Take 1 tablet (500 mg total) by mouth 2 (two) times daily with a meal. For pain. 20 tablet 0  . predniSONE (DELTASONE) 10 MG tablet 1 tablet daily (Patient taking differently: 10 mg daily as needed. For swelling) 10 tablet 0   No current facility-administered medications for this visit.     Allergies  Allergen Reactions  . Malt   . Milk-Related Compounds   . Shellfish Allergy   . Soy Allergy     Family History  Problem Relation Age of Onset  . Breast cancer Maternal Grandmother     Social History   Socioeconomic History  . Marital status: Married    Spouse name: Not on file  . Number of  children: Not on file  . Years of education: 52  . Highest education level: Not on file  Occupational History  . Occupation: unemployed  . Occupation: homemaker  Social Needs  . Financial resource strain: Not on file  . Food insecurity    Worry: Not on file    Inability: Not on file  . Transportation needs    Medical: Not on file    Non-medical: Not on file  Tobacco Use  . Smoking status: Current Every Day Smoker    Packs/day: 1.00    Years: 10.00    Pack years: 10.00    Types: Cigarettes  . Smokeless tobacco: Never Used  Substance and Sexual Activity  . Alcohol use: No  . Drug use: No  . Sexual activity: Yes    Birth control/protection: Surgical    Comment: Vasectomy  Lifestyle  . Physical activity    Days per week: Not on file    Minutes per session: Not on file  . Stress: Not on file  Relationships  . Social Herbalist on phone: Not on file    Gets together: Not on file    Attends religious service: Not on file    Active member of club or organization: Not on file  Attends meetings of clubs or organizations: Not on file    Relationship status: Not on file  . Intimate partner violence    Fear of current or ex partner: Not on file    Emotionally abused: Not on file    Physically abused: Not on file    Forced sexual activity: Not on file  Other Topics Concern  . Not on file  Social History Narrative  . Not on file     Constitutional: Denies fever, malaise, fatigue, headache or abrupt weight changes.  Respiratory: Denies difficulty breathing, shortness of breath, cough or sputum production.   Cardiovascular: Denies chest pain, chest tightness, palpitations or swelling in the hands or feet.  Musculoskeletal: Denies decrease in range of motion, difficulty with gait, muscle pain or joint pain and swelling.   No other specific complaints in a complete review of systems (except as listed in HPI above).  Objective:   Physical Exam  BP 136/80   Pulse  88   Temp 98.2 F (36.8 C) (Temporal)   Wt (!) 333 lb (151 kg)   SpO2 99%   BMI 55.41 kg/m   Wt Readings from Last 3 Encounters:  05/12/19 (!) 342 lb (155.1 kg)  04/28/19 (!) 322 lb 4 oz (146.2 kg)  04/23/19 (!) 343 lb (155.6 kg)    General: Appears her stated age, obese in NAD. Skin: Warm, dry and intact. No rashes noted. Cardiovascular: Normal rate and rhythm. S1,S2 noted.  No murmur, rubs or gallops noted. No JVD or BLE edema.  Pulmonary/Chest: Normal effort and positive vesicular breath sounds. No respiratory distress. No wheezes, rales or ronchi noted.      BMET    Component Value Date/Time   NA 136 04/07/2019 1117   K 4.4 04/07/2019 1117   CL 99 04/07/2019 1117   CO2 31 04/07/2019 1117   GLUCOSE 204 (H) 04/07/2019 1117   BUN 9 04/07/2019 1117   CREATININE 0.58 04/07/2019 1117   CALCIUM 8.9 04/07/2019 1117   GFRNONAA >60 10/18/2017 1746   GFRAA >60 10/18/2017 1746    Lipid Panel     Component Value Date/Time   CHOL 226 (H) 04/28/2019 1129   TRIG 185.0 (H) 04/28/2019 1129   HDL 29.40 (L) 04/28/2019 1129   CHOLHDL 8 04/28/2019 1129   VLDL 37.0 04/28/2019 1129   LDLCALC 160 (H) 04/28/2019 1129    CBC    Component Value Date/Time   WBC 12.7 (H) 04/28/2019 1129   RBC 5.16 (H) 04/28/2019 1129   HGB 13.7 04/28/2019 1129   HGB 13.2 10/06/2012 0955   HCT 43.3 04/28/2019 1129   HCT 42.0 10/06/2012 0955   PLT 338.0 04/28/2019 1129   PLT 309 10/06/2012 0955   MCV 83.9 04/28/2019 1129   MCV 79 (L) 10/06/2012 0955   MCH 26.3 10/18/2017 1746   MCHC 31.6 04/28/2019 1129   RDW 15.6 (H) 04/28/2019 1129   RDW 15.1 (H) 10/06/2012 0955   LYMPHSABS 0.6 (L) 10/18/2017 1746   MONOABS 0.3 10/18/2017 1746   EOSABS 0.0 10/18/2017 1746   BASOSABS 0.0 10/18/2017 1746    Hgb A1C Lab Results  Component Value Date   HGBA1C 10.4 (A) 04/13/2019            Assessment & Plan:

## 2019-05-26 NOTE — Assessment & Plan Note (Addendum)
BP better but not at goal. Increase Losartan/HCTZ to 2 tabs daily and recheck in 2 weeks. (Still has 1 refill for previous dose) Continue to work on weight loss and DASH diet!

## 2019-05-26 NOTE — Patient Instructions (Signed)

## 2019-05-27 ENCOUNTER — Telehealth: Payer: Self-pay | Admitting: Internal Medicine

## 2019-05-27 DIAGNOSIS — Z4589 Encounter for adjustment and management of other implanted devices: Secondary | ICD-10-CM

## 2019-05-27 NOTE — Telephone Encounter (Signed)
Referral placed.

## 2019-05-27 NOTE — Telephone Encounter (Signed)
Pt requests ENT referral. Pt has butterfly tubes in both ears.  The tube in left ear has fallen out and she feels the right tube is clogged.

## 2019-05-27 NOTE — Addendum Note (Signed)
Addended by: Jearld Fenton on: 05/27/2019 01:41 PM   Modules accepted: Orders

## 2019-06-05 NOTE — Telephone Encounter (Signed)
Pt has MCD CA.  Her PCP for MCD CA is [facility].  She doesn't want to see [facility] and will change her PCP and will have them refer her.  Pt understands we can't refer her.

## 2019-06-11 ENCOUNTER — Ambulatory Visit (INDEPENDENT_AMBULATORY_CARE_PROVIDER_SITE_OTHER): Payer: Medicaid Other | Admitting: Internal Medicine

## 2019-06-11 ENCOUNTER — Encounter: Payer: Self-pay | Admitting: Internal Medicine

## 2019-06-11 ENCOUNTER — Other Ambulatory Visit: Payer: Self-pay

## 2019-06-11 DIAGNOSIS — I10 Essential (primary) hypertension: Secondary | ICD-10-CM | POA: Diagnosis not present

## 2019-06-11 MED ORDER — LOSARTAN POTASSIUM-HCTZ 100-25 MG PO TABS: 1.0000 | ORAL_TABLET | Freq: Every day | ORAL | 3 refills | Status: DC

## 2019-06-11 NOTE — Assessment & Plan Note (Signed)
At goal Losartan HCT 100-25 mg refilled today Reinforced DASH diet and exercise for weight loss Seeing rheum next week- asked they draw a BMET and send me the results

## 2019-06-11 NOTE — Patient Instructions (Signed)

## 2019-06-11 NOTE — Progress Notes (Signed)
Subjective:    Patient ID: EZME DUCH, female    DOB: 08-Feb-1981, 38 y.o.   MRN: [id number]  HPI  Pt presents to the clinic today for 2 week follow up of HTN. At her last visit, her Losartan HCT was increased to 100-25 mg daily. She has been taking the medication as prescribed. Her BP today is 122/82. ECG from 09/2017 reviewed.  Review of Systems  Past Medical History:  Diagnosis Date  . BMI 50.0-59.9, adult ([place])   . History of Papanicolaou smear of cervix 04/2011   normal per pt.  . 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 albuterol (PROVENTIL HFA;VENTOLIN HFA) 108 (90 Base) MCG/ACT inhaler Inhale 2 puffs into the lungs every 6 (six) hours as needed for wheezing or shortness of breath. 1 Inhaler 0  . EPINEPHrine (EPIPEN 2-PAK) 0.3 mg/0.3 mL IJ SOAJ injection Inject 0.3 mLs (0.3 mg total) into the muscle once. 2 Device 0  . losartan-hydrochlorothiazide (HYZAAR) 50-12.5 MG tablet Take 2 tablets by mouth daily. 30 tablet 2  . naproxen (NAPROSYN) 500 MG tablet Take 1 tablet (500 mg total) by mouth 2 (two) times daily with a meal. For pain. 20 tablet 0  . predniSONE (DELTASONE) 10 MG tablet 1 tablet daily (Patient taking differently: 10 mg daily as needed. For swelling) 10 tablet 0   No current facility-administered medications for this visit.     Allergies  Allergen Reactions  . Malt   . Milk-Related Compounds   . Shellfish Allergy   . Soy Allergy     Family History  Problem Relation Age of Onset  . Breast cancer Maternal Grandmother     Social History   Socioeconomic History  . Marital status: Married    Spouse name: Not on file  . Number of children: Not on file  . Years of education: 53  . Highest education level: Not on file  Occupational History  . Occupation: unemployed  . Occupation: homemaker  Social Needs  . Financial resource strain: Not on file  . Food  insecurity    Worry: Not on file    Inability: Not on file  . Transportation needs    Medical: Not on file    Non-medical: Not on file  Tobacco Use  . Smoking status: Current Every Day Smoker    Packs/day: 1.00    Years: 10.00    Pack years: 10.00    Types: Cigarettes  . Smokeless tobacco: Never Used  Substance and Sexual Activity  . Alcohol use: No  . Drug use: No  . Sexual activity: Yes    Birth control/protection: Surgical    Comment: Vasectomy  Lifestyle  . Physical activity    Days per week: Not on file    Minutes per session: Not on file  . Stress: Not on file  Relationships  . Social Herbalist on phone: Not on file    Gets together: Not on file    Attends religious service: Not on file    Active member of club or organization: Not on file    Attends meetings of clubs or organizations: Not on file    Relationship status: Not on file  . Intimate partner violence    Fear of current or ex partner: Not on file    Emotionally abused: Not on file    Physically abused: Not on file  Forced sexual activity: Not on file  Other Topics Concern  . Not on file  Social History Narrative  . Not on file     Constitutional: Denies fever, malaise, fatigue, headache or abrupt weight changes.  Respiratory: Denies difficulty breathing, shortness of breath, cough or sputum production.   Cardiovascular: Denies chest pain, chest tightness, palpitations or swelling in the hands or feet.  Neurological: Denies dizziness, difficulty with memory, difficulty with speech or problems with balance and coordination.    No other specific complaints in a complete review of systems (except as listed in HPI above).     Objective:   Physical Exam  BP 122/82   Pulse 96   Temp 98.5 F (36.9 C) (Temporal)   Wt (!) 332 lb (150.6 kg)   SpO2 97%   BMI 55.25 kg/m  Wt Readings from Last 3 Encounters:  06/11/19 (!) 332 lb (150.6 kg)  05/26/19 (!) 333 lb (151 kg)  05/12/19 (!) 342  lb (155.1 kg)    General: Appears her stated age, obese, in NAD. Cardiovascular: Normal rate and rhythm. S1,S2 noted.  No murmur, rubs or gallops noted. No JVD or BLE edema.  Pulmonary/Chest: Normal effort and positive vesicular breath sounds. No respiratory distress. No wheezes, rales or ronchi noted.  Neurological: Alert and oriented.     BMET    Component Value Date/Time   NA 136 04/07/2019 1117   K 4.4 04/07/2019 1117   CL 99 04/07/2019 1117   CO2 31 04/07/2019 1117   GLUCOSE 204 (H) 04/07/2019 1117   BUN 9 04/07/2019 1117   CREATININE 0.58 04/07/2019 1117   CALCIUM 8.9 04/07/2019 1117   GFRNONAA >60 10/18/2017 1746   GFRAA >60 10/18/2017 1746    Lipid Panel     Component Value Date/Time   CHOL 226 (H) 04/28/2019 1129   TRIG 185.0 (H) 04/28/2019 1129   HDL 29.40 (L) 04/28/2019 1129   CHOLHDL 8 04/28/2019 1129   VLDL 37.0 04/28/2019 1129   LDLCALC 160 (H) 04/28/2019 1129    CBC    Component Value Date/Time   WBC 12.7 (H) 04/28/2019 1129   RBC 5.16 (H) 04/28/2019 1129   HGB 13.7 04/28/2019 1129   HGB 13.2 10/06/2012 0955   HCT 43.3 04/28/2019 1129   HCT 42.0 10/06/2012 0955   PLT 338.0 04/28/2019 1129   PLT 309 10/06/2012 0955   MCV 83.9 04/28/2019 1129   MCV 79 (L) 10/06/2012 0955   MCH 26.3 10/18/2017 1746   MCHC 31.6 04/28/2019 1129   RDW 15.6 (H) 04/28/2019 1129   RDW 15.1 (H) 10/06/2012 0955   LYMPHSABS 0.6 (L) 10/18/2017 1746   MONOABS 0.3 10/18/2017 1746   EOSABS 0.0 10/18/2017 1746   BASOSABS 0.0 10/18/2017 1746    Hgb A1C Lab Results  Component Value Date   HGBA1C 10.4 (A) 04/13/2019           Assessment & Plan:

## 2019-07-05 ENCOUNTER — Encounter: Payer: Self-pay | Admitting: Internal Medicine

## 2019-07-06 NOTE — Telephone Encounter (Signed)
Patient called to schedule labs for HCG test.  Can you put orders in for this

## 2019-07-07 ENCOUNTER — Telehealth: Payer: Self-pay | Admitting: Internal Medicine

## 2019-07-07 ENCOUNTER — Ambulatory Visit (INDEPENDENT_AMBULATORY_CARE_PROVIDER_SITE_OTHER): Payer: Medicaid Other

## 2019-07-07 ENCOUNTER — Encounter: Payer: Self-pay | Admitting: Internal Medicine

## 2019-07-07 ENCOUNTER — Other Ambulatory Visit (INDEPENDENT_AMBULATORY_CARE_PROVIDER_SITE_OTHER): Payer: Medicaid Other

## 2019-07-07 DIAGNOSIS — N926 Irregular menstruation, unspecified: Secondary | ICD-10-CM

## 2019-07-07 DIAGNOSIS — Z23 Encounter for immunization: Secondary | ICD-10-CM | POA: Diagnosis not present

## 2019-07-07 LAB — HCG, QUANTITATIVE, PREGNANCY: Quantitative HCG: <0.6 m[IU]/mL

## 2019-07-07 NOTE — Telephone Encounter (Signed)
Holly Garner is not in the office, will not be back until tomorrow afternoon and pt has doxy, will discuss

## 2019-07-07 NOTE — Telephone Encounter (Signed)
Patient is calling back in regards to lab results. She stated she really could not wait until Thursday at her visit. Patient stated she was also told she would get these results back today

## 2019-07-07 NOTE — Telephone Encounter (Signed)
Patient called to get the results of her lab work done this morning.  She asked to get the results as soon as possible.

## 2019-07-09 ENCOUNTER — Encounter: Payer: Self-pay | Admitting: Internal Medicine

## 2019-07-09 ENCOUNTER — Ambulatory Visit (INDEPENDENT_AMBULATORY_CARE_PROVIDER_SITE_OTHER): Payer: Medicaid Other | Admitting: Internal Medicine

## 2019-07-09 DIAGNOSIS — N926 Irregular menstruation, unspecified: Secondary | ICD-10-CM | POA: Diagnosis not present

## 2019-07-09 NOTE — Progress Notes (Signed)
Virtual Visit via Video Note  I connected with Holly Garner on 07/09/19 at 10:15 AM EDT by a video enabled telemedicine application and verified that I am speaking with the correct person using two identifiers.  Location: Patient: Home Provider: Office   I discussed the limitations of evaluation and management by telemedicine and the availability of in person appointments. The patient expressed understanding and agreed to proceed.  History of Present Illness:  Pt reports missed menses. She reports her LMP was 05/15/19. She had her IUD removed 04/2019. Pap at that time was normal. She denies history of irregular periods. She is sexually active but her husband did have a vasectomy. She denies s/s of pregnancy. Serum HcG recently negative. She has no history of PCOS.   Past Medical History:  Diagnosis Date  . BMI 50.0-59.9, adult ([facility])   . History of Papanicolaou smear of cervix 04/2011   normal per pt.  . 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 albuterol (PROVENTIL HFA;VENTOLIN HFA) 108 (90 Base) MCG/ACT inhaler Inhale 2 puffs into the lungs every 6 (six) hours as needed for wheezing or shortness of breath. 1 Inhaler 0  . EPINEPHrine (EPIPEN 2-PAK) 0.3 mg/0.3 mL IJ SOAJ injection Inject 0.3 mLs (0.3 mg total) into the muscle once. 2 Device 0  . losartan-hydrochlorothiazide (HYZAAR) 100-25 MG tablet Take 1 tablet by mouth daily. 90 tablet 3  . naproxen (NAPROSYN) 500 MG tablet Take 1 tablet (500 mg total) by mouth 2 (two) times daily with a meal. For pain. 20 tablet 0  . predniSONE (DELTASONE) 10 MG tablet 1 tablet daily (Patient taking differently: 10 mg daily as needed. For swelling) 10 tablet 0   No current facility-administered medications for this visit.     Allergies  Allergen Reactions  . Malt   . Milk-Related Compounds   . Shellfish Allergy   . Soy Allergy     Family History   Problem Relation Age of Onset  . Breast cancer Maternal Grandmother     Social History   Socioeconomic History  . Marital status: Married    Spouse name: Not on file  . Number of children: Not on file  . Years of education: 712  . Highest education level: Not on file  Occupational History  . Occupation: unemployed  . Occupation: homemaker  Social Needs  . Financial resource strain: Not on file  . Food insecurity    Worry: Not on file    Inability: Not on file  . Transportation needs    Medical: Not on file    Non-medical: Not on file  Tobacco Use  . Smoking status: Current Every Day Smoker    Packs/day: 1.00    Years: 10.00    Pack years: 10.00    Types: Cigarettes  . Smokeless tobacco: Never Used  Substance and Sexual Activity  . Alcohol use: No  . Drug use: No  . Sexual activity: Yes    Birth control/protection: Surgical    Comment: Vasectomy  Lifestyle  . Physical activity    Days per week: Not on file    Minutes per session: Not on file  . Stress: Not on file  Relationships  . Social Musicianconnections    Talks on phone: Not on file    Gets together: Not on file    Attends religious service: Not on file    Active member of club  or organization: Not on file    Attends meetings of clubs or organizations: Not on file    Relationship status: Not on file  . Intimate partner violence    Fear of current or ex partner: Not on file    Emotionally abused: Not on file    Physically abused: Not on file    Forced sexual activity: Not on file  Other Topics Concern  . Not on file  Social History Narrative  . Not on file     Constitutional: Denies fever, malaise, fatigue, headache or abrupt weight changes.  Respiratory: Denies difficulty breathing, shortness of breath, cough or sputum production.   Cardiovascular: Denies chest pain, chest tightness, palpitations or swelling in the hands or feet.  Gastrointestinal: Denies abdominal pain, bloating, constipation, diarrhea  or blood in the stool.  GU: Pt reports missed menses. Denies urgency, frequency, pain with urination, burning sensation, blood in urine, odor or discharge.  No other specific complaints in a complete review of systems (except as listed in HPI above).  Observations/Objective:   Wt Readings from Last 3 Encounters:  06/11/19 (!) 332 lb (150.6 kg)  05/26/19 (!) 333 lb (151 kg)  05/12/19 (!) 342 lb (155.1 kg)    General: Appears her stated age,obese, in NAD. Pulmonary/Chest: Normal effort. No respiratory distress.  Neurological: Alert and oriented.    BMET    Component Value Date/Time   NA 136 04/07/2019 1117   K 4.4 04/07/2019 1117   CL 99 04/07/2019 1117   CO2 31 04/07/2019 1117   GLUCOSE 204 (H) 04/07/2019 1117   BUN 9 04/07/2019 1117   CREATININE 0.58 04/07/2019 1117   CALCIUM 8.9 04/07/2019 1117   GFRNONAA >60 10/18/2017 1746   GFRAA >60 10/18/2017 1746    Lipid Panel     Component Value Date/Time   CHOL 226 (H) 04/28/2019 1129   TRIG 185.0 (H) 04/28/2019 1129   HDL 29.40 (L) 04/28/2019 1129   CHOLHDL 8 04/28/2019 1129   VLDL 37.0 04/28/2019 1129   LDLCALC 160 (H) 04/28/2019 1129    CBC    Component Value Date/Time   WBC 12.7 (H) 04/28/2019 1129   RBC 5.16 (H) 04/28/2019 1129   HGB 13.7 04/28/2019 1129   HGB 13.2 10/06/2012 0955   HCT 43.3 04/28/2019 1129   HCT 42.0 10/06/2012 0955   PLT 338.0 04/28/2019 1129   PLT 309 10/06/2012 0955   MCV 83.9 04/28/2019 1129   MCV 79 (L) 10/06/2012 0955   MCH 26.3 10/18/2017 1746   MCHC 31.6 04/28/2019 1129   RDW 15.6 (H) 04/28/2019 1129   RDW 15.1 (H) 10/06/2012 0955   LYMPHSABS 0.6 (L) 10/18/2017 1746   MONOABS 0.3 10/18/2017 1746   EOSABS 0.0 10/18/2017 1746   BASOSABS 0.0 10/18/2017 1746    Hgb A1C Lab Results  Component Value Date   HGBA1C 10.4 (A) 04/13/2019        Assessment and Plan:  Missed Menses:  Serum HcG negative Recently had IUD taken out- likely hormonal Discussed trial of Provera  x 7-10 days but she declines  Will plan on monitoring x 1-2 months to see if periods return to normal without intervention Will monitor  Follow Up Instructions:    I discussed the assessment and treatment plan with the patient. The patient was provided an opportunity to ask questions and all were answered. The patient agreed with the plan and demonstrated an understanding of the instructions.   The patient was advised to call back  or seek an in-person evaluation if the symptoms worsen or if the condition fails to improve as anticipated.     Webb Silversmith, NP

## 2019-07-09 NOTE — Patient Instructions (Signed)
Menorrhagia Menorrhagia is when your menstrual periods are heavy or last longer than normal. Follow these instructions at home: Medicines   Take over-the-counter and prescription medicines exactly as told by your doctor. This includes iron pills.  Do not change or switch medicines without asking your doctor.  Do not take aspirin or medicines that contain aspirin 1 week before or during your period. Aspirin may make bleeding worse. General instructions  If you need to change your pad or tampon more than once every 2 hours, limit your activity until the bleeding stops.  Iron pills can cause problems when pooping (constipation). To prevent or treat pooping problems while taking prescription iron pills, your doctor may suggest that you: ? Drink enough fluid to keep your pee (urine) clear or pale yellow. ? Take over-the-counter or prescription medicines. ? Eat foods that are high in fiber. These foods include: ? Fresh fruits and vegetables. ? Whole grains. ? Beans. ? Limit foods that are high in fat and processed sugars. This includes fried and sweet foods.  Eat healthy meals and foods that are high in iron. Foods that have a lot of iron include: ? Leafy green vegetables. ? Meat. ? Liver. ? Eggs. ? Whole grain breads and cereals.  Do not try to lose weight until your heavy bleeding has stopped and you have normal amounts of iron in your blood. If you need to lose weight, work with your doctor.  Keep all follow-up visits as told by your doctor. This is important. Contact a doctor if:  You soak through a pad or tampon every 1 or 2 hours, and this happens every time you have a period.  You need to use pads and tampons at the same time because you are bleeding so much.  You are taking medicine and you: ? Feel sick to your stomach (nauseous). ? Throw up (vomit). ? Have watery poop (diarrhea).  You have other problems that may be related to the medicine you are taking. Get help  right away if:  You soak through more than a pad or tampon in 1 hour.  You pass clots bigger than 1 inch (2.5 cm) wide.  You feel short of breath.  You feel like your heart is beating too fast.  You feel dizzy or you pass out (faint).  You feel very weak or tired. Summary  Menorrhagia is when your menstrual periods are heavy or last longer than normal.  Take over-the-counter and prescription medicines exactly as told by your doctor. This includes iron pills.  Contact a doctor if you soak through more than a pad or tampon in 1 hour or are passing large clots. This information is not intended to replace advice given to you by your health care provider. Make sure you discuss any questions you have with your health care provider. Document Released: 07/17/2008 Document Revised: 01/15/2018 Document Reviewed: 10/29/2016 Elsevier Patient Education  2020 [employer]  

## 2019-07-23 ENCOUNTER — Other Ambulatory Visit: Payer: Self-pay

## 2019-07-23 ENCOUNTER — Ambulatory Visit (INDEPENDENT_AMBULATORY_CARE_PROVIDER_SITE_OTHER): Payer: Medicaid Other | Admitting: Internal Medicine

## 2019-07-23 VITALS — BP 124/84 | HR 100 | Temp 97.8°F | Wt 322.0 lb

## 2019-07-23 DIAGNOSIS — E1165 Type 2 diabetes mellitus with hyperglycemia: Secondary | ICD-10-CM

## 2019-07-23 LAB — POCT GLYCOSYLATED HEMOGLOBIN (HGB A1C): Hemoglobin A1C: 8.5 % — AB (ref 4.0–5.6)

## 2019-07-23 NOTE — Progress Notes (Signed)
poc a1c 

## 2019-07-25 ENCOUNTER — Encounter: Payer: Self-pay | Admitting: Internal Medicine

## 2019-07-25 NOTE — Patient Instructions (Signed)

## 2019-07-25 NOTE — Progress Notes (Signed)
Subjective:    Patient ID: Holly Garner, female    DOB: 06/12/1981, 38 y.o.   MRN: [id number]  HPI  Patient presents to the clinic today for 37-month follow-up of diabetes.  She was diagnosed with diabetes 03/2019.  Her A1c at that time was 10.4.  She declined medication at that time and wanted to work on lifestyle changes.  Since that time she has lost 31 pounds.  She is not checking her sugars.  She has not had an eye exam.  She is on Losartan for renal protection.  She has been checking her feet routinely.  Review of Systems  Past Medical History:  Diagnosis Date  . BMI 50.0-59.9, adult ([facility])   . History of Papanicolaou smear of cervix 04/2011   normal per pt.  . 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 albuterol (PROVENTIL HFA;VENTOLIN HFA) 108 (90 Base) MCG/ACT inhaler Inhale 2 puffs into the lungs every 6 (six) hours as needed for wheezing or shortness of breath. 1 Inhaler 0  . EPINEPHrine (EPIPEN 2-PAK) 0.3 mg/0.3 mL IJ SOAJ injection Inject 0.3 mLs (0.3 mg total) into the muscle once. 2 Device 0  . losartan-hydrochlorothiazide (HYZAAR) 100-25 MG tablet Take 1 tablet by mouth daily. 90 tablet 3  . naproxen (NAPROSYN) 500 MG tablet Take 1 tablet (500 mg total) by mouth 2 (two) times daily with a meal. For pain. 20 tablet 0  . predniSONE (DELTASONE) 10 MG tablet 1 tablet daily (Patient taking differently: 10 mg daily as needed. For swelling) 10 tablet 0   No current facility-administered medications for this visit.     Allergies  Allergen Reactions  . Malt   . Milk-Related Compounds   . Shellfish Allergy   . Soy Allergy     Family History  Problem Relation Age of Onset  . Breast cancer Maternal Grandmother     Social History   Socioeconomic History  . Marital status: Married    Spouse name: Not on file  . Number of children: Not on file  . Years of education: 68  .  Highest education level: Not on file  Occupational History  . Occupation: unemployed  . Occupation: homemaker  Social Needs  . Financial resource strain: Not on file  . Food insecurity    Worry: Not on file    Inability: Not on file  . Transportation needs    Medical: Not on file    Non-medical: Not on file  Tobacco Use  . Smoking status: Current Every Day Smoker    Packs/day: 1.00    Years: 10.00    Pack years: 10.00    Types: Cigarettes  . Smokeless tobacco: Never Used  Substance and Sexual Activity  . Alcohol use: No  . Drug use: No  . Sexual activity: Yes    Birth control/protection: Surgical    Comment: Vasectomy  Lifestyle  . Physical activity    Days per week: Not on file    Minutes per session: Not on file  . Stress: Not on file  Relationships  . Social Musician on phone: Not on file    Gets together: Not on file    Attends religious service: Not on file    Active member of club or organization: Not on file    Attends meetings of clubs or organizations: Not on file    Relationship status:  Not on file  . Intimate partner violence    Fear of current or ex partner: Not on file    Emotionally abused: Not on file    Physically abused: Not on file    Forced sexual activity: Not on file  Other Topics Concern  . Not on file  Social History Narrative  . Not on file     Constitutional: Denies fever, malaise, fatigue, headache or abrupt weight changes.  Respiratory: Denies difficulty breathing, shortness of breath, cough or sputum production.   Cardiovascular: Denies chest pain, chest tightness, palpitations or swelling in the hands or feet.  Gastrointestinal: Denies abdominal pain, bloating, constipation, diarrhea or blood in the stool.  GU: Denies urgency, frequency, pain with urination, burning sensation, blood in urine, odor or discharge. Skin: Denies redness, rashes, lesions or ulcercations.  Neurological: Denies numbness, tingling or burning  sensation of the lower extremities.   No other specific complaints in a complete review of systems (except as listed in HPI above).     Objective:   Physical Exam   BP 124/84   Pulse 100   Temp 97.8 F (36.6 C) (Temporal)   Wt (!) 322 lb (146.1 kg)   SpO2 98%   BMI 53.58 kg/m  Wt Readings from Last 3 Encounters:  07/23/19 (!) 322 lb (146.1 kg)  06/11/19 (!) 332 lb (150.6 kg)  05/26/19 (!) 333 lb (151 kg)    General: Appears her stated age, obese, in NAD. Skin: Warm, dry and intact. No ulcerations noted. Cardiovascular: Normal rate and rhythm. S1,S2 noted.  No murmur, rubs or gallops noted.  Pulmonary/Chest: Normal effort and positive vesicular breath sounds. No respiratory distress. No wheezes, rales or ronchi noted.  Neurological: Alert and oriented.  Sensation intact to BLE.  BMET    Component Value Date/Time   NA 136 04/07/2019 1117   K 4.4 04/07/2019 1117   CL 99 04/07/2019 1117   CO2 31 04/07/2019 1117   GLUCOSE 204 (H) 04/07/2019 1117   BUN 9 04/07/2019 1117   CREATININE 0.58 04/07/2019 1117   CALCIUM 8.9 04/07/2019 1117   GFRNONAA >60 10/18/2017 1746   GFRAA >60 10/18/2017 1746    Lipid Panel     Component Value Date/Time   CHOL 226 (H) 04/28/2019 1129   TRIG 185.0 (H) 04/28/2019 1129   HDL 29.40 (L) 04/28/2019 1129   CHOLHDL 8 04/28/2019 1129   VLDL 37.0 04/28/2019 1129   LDLCALC 160 (H) 04/28/2019 1129    CBC    Component Value Date/Time   WBC 12.7 (H) 04/28/2019 1129   RBC 5.16 (H) 04/28/2019 1129   HGB 13.7 04/28/2019 1129   HGB 13.2 10/06/2012 0955   HCT 43.3 04/28/2019 1129   HCT 42.0 10/06/2012 0955   PLT 338.0 04/28/2019 1129   PLT 309 10/06/2012 0955   MCV 83.9 04/28/2019 1129   MCV 79 (L) 10/06/2012 0955   MCH 26.3 10/18/2017 1746   MCHC 31.6 04/28/2019 1129   RDW 15.6 (H) 04/28/2019 1129   RDW 15.1 (H) 10/06/2012 0955   LYMPHSABS 0.6 (L) 10/18/2017 1746   MONOABS 0.3 10/18/2017 1746   EOSABS 0.0 10/18/2017 1746   BASOSABS  0.0 10/18/2017 1746    Hgb A1C Lab Results  Component Value Date   HGBA1C 8.5 (A) 07/23/2019           Assessment & Plan:

## 2019-07-25 NOTE — Assessment & Plan Note (Signed)
POCT A1c 8.5 today Congratulated her on weight loss No microalbumin secondary to ARB therapy She would like to continue lifestyle changes, low-carb diet and exercise for weight loss We will send her in a meter, strips and lancets to check her sugars twice daily Blood sugar ranges given Encouraged her to consume a low-carb diet and exercise for weight loss Encouraged routine eye exam Foot exam today Flu shot up-to-date She declines pneumonia vaccine

## 2019-08-13 MED ORDER — ACCU-CHEK GUIDE VI STRP: 1.0000 | ORAL_STRIP | Freq: Two times a day (BID) | 2 refills | Status: DC

## 2019-08-13 MED ORDER — ACCU-CHEK GUIDE W/DEVICE KIT: 1.0000 | PACK | Freq: Once | 0 refills | Status: AC

## 2019-08-13 NOTE — Addendum Note (Signed)
Addended by: Lurlean Nanny on: 08/13/2019 05:31 PM   Modules accepted: Orders

## 2019-08-19 ENCOUNTER — Encounter: Payer: Self-pay | Admitting: Internal Medicine

## 2019-08-19 MED ORDER — PREDNISONE 10 MG PO TABS: 10.0000 mg | ORAL_TABLET | Freq: Every day | ORAL | 0 refills | Status: DC | PRN

## 2019-08-19 NOTE — Telephone Encounter (Signed)
Patient called asking to speak to [facility] or Rollene Fare whomever can get back to her first.  Patient can be reached at [phone].

## 2019-09-25 ENCOUNTER — Encounter: Payer: Self-pay | Admitting: Internal Medicine

## 2019-09-25 ENCOUNTER — Ambulatory Visit (INDEPENDENT_AMBULATORY_CARE_PROVIDER_SITE_OTHER): Payer: Medicaid Other | Admitting: Internal Medicine

## 2019-09-25 DIAGNOSIS — M7989 Other specified soft tissue disorders: Secondary | ICD-10-CM

## 2019-09-25 DIAGNOSIS — S31105A Unspecified open wound of abdominal wall, periumbilic region without penetration into peritoneal cavity, initial encounter: Secondary | ICD-10-CM

## 2019-09-25 NOTE — Assessment & Plan Note (Signed)
Refilled Prednisone today Encouraged her to go ahead and make an appt with rheumatology for further evaluation

## 2019-09-25 NOTE — Progress Notes (Signed)
Virtual Visit via Video Note  I connected with Holly Garner on 09/25/19 at  3:45 PM EST by a video enabled telemedicine application and verified that I am speaking with the correct person using two identifiers.  Location: Patient: Driving in her car Provider: Office   I discussed the limitations of evaluation and management by telemedicine and the availability of in person appointments. The patient expressed understanding and agreed to proceed.  History of Present Illness:  Pt reports swelling in hands. This occurs intermittently but she reports this episode started 2 days ago. She reports it feels tight and itchy but she denies numbness, tingling or weakness.  She has an elevated ESR and CRP. She was referred to rheumatology but she reports as appt was never made. She does take Prednisone as needed with good relief of symptoms.  She also c/o drainage from her belly button. She noticed this 2 days ago. She reports her belly button is tender, but she has not noticed any redness or swelling. She reports yellow drainage from the bellybutton that seems to be pus. She is unsure if it has an odor. She reports this also occurred 2 months ago. She denies fever, chills or body aches. She has not had any abdominal surgeries. She has not tried anything OTC.  Past Medical History:  Diagnosis Date  . BMI 50.0-59.9, adult ([place])   . History of Papanicolaou smear of cervix 04/2011   normal per pt.  . 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 albuterol (PROVENTIL HFA;VENTOLIN HFA) 108 (90 Base) MCG/ACT inhaler Inhale 2 puffs into the lungs every 6 (six) hours as needed for wheezing or shortness of breath. 1 Inhaler 0  . EPINEPHrine (EPIPEN 2-PAK) 0.3 mg/0.3 mL IJ SOAJ injection Inject 0.3 mLs (0.3 mg total) into the muscle once. 2 Device 0  . glucose blood (ACCU-CHEK GUIDE) test strip 1 each by Other route 2  (two) times daily. 200 each 2  . losartan-hydrochlorothiazide (HYZAAR) 100-25 MG tablet Take 1 tablet by mouth daily. 90 tablet 3  . naproxen (NAPROSYN) 500 MG tablet Take 1 tablet (500 mg total) by mouth 2 (two) times daily with a meal. For pain. 20 tablet 0  . predniSONE (DELTASONE) 10 MG tablet Take 1 tablet (10 mg total) by mouth daily as needed. For swelling 10 tablet 0   No current facility-administered medications for this visit.     Allergies  Allergen Reactions  . Malt   . Milk-Related Compounds   . Shellfish Allergy   . Soy Allergy     Family History  Problem Relation Age of Onset  . Breast cancer Maternal Grandmother     Social History   Socioeconomic History  . Marital status: Married    Spouse name: Not on file  . Number of children: Not on file  . Years of education: 7  . Highest education level: Not on file  Occupational History  . Occupation: unemployed  . Occupation: homemaker  Social Needs  . Financial resource strain: Not on file  . Food insecurity    Worry: Not on file    Inability: Not on file  . Transportation needs    Medical: Not on file    Non-medical: Not on file  Tobacco Use  . Smoking status: Current Every Day Smoker    Packs/day: 1.00    Years: 10.00    Pack years: 10.00  Types: Cigarettes  . Smokeless tobacco: Never Used  Substance and Sexual Activity  . Alcohol use: No  . Drug use: No  . Sexual activity: Yes    Birth control/protection: Surgical    Comment: Vasectomy  Lifestyle  . Physical activity    Days per week: Not on file    Minutes per session: Not on file  . Stress: Not on file  Relationships  . Social Herbalist on phone: Not on file    Gets together: Not on file    Attends religious service: Not on file    Active member of club or organization: Not on file    Attends meetings of clubs or organizations: Not on file    Relationship status: Not on file  . Intimate partner violence    Fear of current  or ex partner: Not on file    Emotionally abused: Not on file    Physically abused: Not on file    Forced sexual activity: Not on file  Other Topics Concern  . Not on file  Social History Narrative  . Not on file     Constitutional: Denies fever, malaise, fatigue, headache or abrupt weight changes.  Respiratory: Denies difficulty breathing, shortness of breath, cough or sputum production.   Cardiovascular: Denies chest pain, chest tightness, palpitations or swelling in the hands or feet.  Musculoskeletal: Pt reports swelling of hands. Denies decrease in range of motion, difficulty with gait, muscle pain or joint pain.  Skin: Pt reports drainage from belly button. Denies redness, rashes, lesions or ulcercations.  .  No other specific complaints in a complete review of systems (except as listed in HPI above).   Observations/Objective:    Wt Readings from Last 3 Encounters:  07/23/19 (!) 322 lb (146.1 kg)  06/11/19 (!) 332 lb (150.6 kg)  05/26/19 (!) 333 lb (151 kg)    General: Appears her stated age, obese, in NAD. Skin:  Cardiovascular: Normal rate and rhythm.  Pulmonary/Chest: Normal effort and positive vesicular breath sounds. No respiratory distress. No wheezes, rales or ronchi noted.  Abdomen:  Musculoskeletal: Swelling noted along theMCP, palmar surface of bilateral hands. Normal flexion and extension of the fingers.  Neurological: Alert and oriented. Coordination normal.    BMET    Component Value Date/Time   NA 136 04/07/2019 1117   K 4.4 04/07/2019 1117   CL 99 04/07/2019 1117   CO2 31 04/07/2019 1117   GLUCOSE 204 (H) 04/07/2019 1117   BUN 9 04/07/2019 1117   CREATININE 0.58 04/07/2019 1117   CALCIUM 8.9 04/07/2019 1117   GFRNONAA >60 10/18/2017 1746   GFRAA >60 10/18/2017 1746    Lipid Panel     Component Value Date/Time   CHOL 226 (H) 04/28/2019 1129   TRIG 185.0 (H) 04/28/2019 1129   HDL 29.40 (L) 04/28/2019 1129   CHOLHDL 8 04/28/2019 1129    VLDL 37.0 04/28/2019 1129   LDLCALC 160 (H) 04/28/2019 1129    CBC    Component Value Date/Time   WBC 12.7 (H) 04/28/2019 1129   RBC 5.16 (H) 04/28/2019 1129   HGB 13.7 04/28/2019 1129   HGB 13.2 10/06/2012 0955   HCT 43.3 04/28/2019 1129   HCT 42.0 10/06/2012 0955   PLT 338.0 04/28/2019 1129   PLT 309 10/06/2012 0955   MCV 83.9 04/28/2019 1129   MCV 79 (L) 10/06/2012 0955   MCH 26.3 10/18/2017 1746   MCHC 31.6 04/28/2019 1129   RDW 15.6 (H)  04/28/2019 1129   RDW 15.1 (H) 10/06/2012 0955   LYMPHSABS 0.6 (L) 10/18/2017 1746   MONOABS 0.3 10/18/2017 1746   EOSABS 0.0 10/18/2017 1746   BASOSABS 0.0 10/18/2017 1746    Hgb A1C Lab Results  Component Value Date   HGBA1C 8.5 (A) 07/23/2019       Assessment and Plan:  Drainage from [place]:  Advised her to take picture of belly button and cotton swab with drainage on it and upload to mychart May need oral abx For now, cleanse with Hydrogen Peroxide Apply Neosporin topically  Will follow up after pictures reviewed from mychart   Follow Up Instructions:    I discussed the assessment and treatment plan with the patient. The patient was provided an opportunity to ask questions and all were answered. The patient agreed with the plan and demonstrated an understanding of the instructions.   The patient was advised to call back or seek an in-person evaluation if the symptoms worsen or if the condition fails to improve as anticipated.    Webb Silversmith, NP

## 2019-09-25 NOTE — Patient Instructions (Signed)
Edema  Edema is when you have too much fluid in your body or under your skin. Edema may make your legs, feet, and ankles swell up. Swelling is also common in looser tissues, like around your eyes. This is a common condition. It gets more common as you get older. There are many possible causes of edema. Eating too much salt (sodium) and being on your feet or sitting for a long time can cause edema in your legs, feet, and ankles. Hot weather may make edema worse. Edema is usually painless. Your skin may look swollen or shiny. Follow these instructions at home:  Keep the swollen body part raised (elevated) above the level of your heart when you are sitting or lying down.  Do not sit still or stand for a long time.  Do not wear tight clothes. Do not wear garters on your upper legs.  Exercise your legs. This can help the swelling go down.  Wear elastic bandages or support stockings as told by your doctor.  Eat a low-salt (low-sodium) diet to reduce fluid as told by your doctor.  Depending on the cause of your swelling, you may need to limit how much fluid you drink (fluid restriction).  Take over-the-counter and prescription medicines only as told by your doctor. Contact a doctor if:  Treatment is not working.  You have heart, liver, or kidney disease and have symptoms of edema.  You have sudden and unexplained weight gain. Get help right away if:  You have shortness of breath or chest pain.  You cannot breathe when you lie down.  You have pain, redness, or warmth in the swollen areas.  You have heart, liver, or kidney disease and get edema all of a sudden.  You have a fever and your symptoms get worse all of a sudden. Summary  Edema is when you have too much fluid in your body or under your skin.  Edema may make your legs, feet, and ankles swell up. Swelling is also common in looser tissues, like around your eyes.  Raise (elevate) the swollen body part above the level of your  heart when you are sitting or lying down.  Follow your doctor's instructions about diet and how much fluid you can drink (fluid restriction). This information is not intended to replace advice given to you by your health care provider. Make sure you discuss any questions you have with your health care provider. Document Released: 03/26/2008 Document Revised: 10/11/2017 Document Reviewed: 10/26/2016 Elsevier Patient Education  2020 [employer]  

## 2019-09-28 ENCOUNTER — Telehealth: Payer: Self-pay | Admitting: Internal Medicine

## 2019-09-28 MED ORDER — PREDNISONE 10 MG PO TABS: 10.0000 mg | ORAL_TABLET | Freq: Every day | ORAL | 0 refills | Status: DC | PRN

## 2019-09-28 NOTE — Telephone Encounter (Signed)
#  10 was sent to pharmacy instead of #20 as discussed at [place] changed to #20 for next refill... pt is aware

## 2019-09-28 NOTE — Telephone Encounter (Signed)
Patient called about her script for prednisone . She stated that [facility] has not received the script for this yet. And would like it sent today if possible

## 2019-09-28 NOTE — Addendum Note (Signed)
Addended by: Lurlean Nanny on: 09/28/2019 02:52 PM   Modules accepted: Orders

## 2019-10-09 ENCOUNTER — Encounter: Payer: Self-pay | Admitting: Internal Medicine

## 2019-10-09 ENCOUNTER — Other Ambulatory Visit: Payer: Self-pay | Admitting: Internal Medicine

## 2019-10-09 MED ORDER — PREDNISONE 10 MG PO TABS: 10.0000 mg | ORAL_TABLET | Freq: Every day | ORAL | 0 refills | Status: DC | PRN

## 2019-10-29 ENCOUNTER — Encounter: Payer: Medicaid Other | Admitting: Internal Medicine

## 2019-10-29 NOTE — Progress Notes (Deleted)
Subjective:    Patient ID: Holly Garner, female    DOB: [birth year], 39 y.o.   MRN: [id number]  HPI  Pt presents to the clinic today for her annual exam. She is also due to follow up chronic conditions.   DM 2: Her last A1C was 8.5%, 07/2019. She is not taking any oral diabetes medication at this time, chose to take 3 months to work on lifestyle changes. Her sugars range. She checks her feet routinely. Her last eye exam was.  HTN: Her BP today is. She is taking Losartan HCT as prescribed. ECG from 09/2017 reviewed.  Swelling: Intermittent. Unknown cause. She has been referred to rheumatology for the same. She takes Prednisone as needed with good relief of symptoms.   Flu: 06/2019 Tetanus: Pap Smear: 04/2019 Dentist:  Diet: Exercise:  Review of Systems      Past Medical History:  Diagnosis Date  . BMI 50.0-59.9, adult (HCC)   . History of Papanicolaou smear of cervix 04/2011   normal per pt.  . 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 albuterol (PROVENTIL HFA;VENTOLIN HFA) 108 (90 Base) MCG/ACT inhaler Inhale 2 puffs into the lungs every 6 (six) hours as needed for wheezing or shortness of breath. 1 Inhaler 0  . EPINEPHrine (EPIPEN 2-PAK) 0.3 mg/0.3 mL IJ SOAJ injection Inject 0.3 mLs (0.3 mg total) into the muscle once. 2 Device 0  . glucose blood (ACCU-CHEK GUIDE) test strip 1 each by Other route 2 (two) times daily. 200 each 2  . losartan-hydrochlorothiazide (HYZAAR) 100-25 MG tablet Take 1 tablet by mouth daily. 90 tablet 3  . naproxen (NAPROSYN) 500 MG tablet Take 1 tablet (500 mg total) by mouth 2 (two) times daily with a meal. For pain. 20 tablet 0  . predniSONE (DELTASONE) 10 MG tablet Take 1 tablet (10 mg total) by mouth daily as needed. For swelling 30 tablet 0   No current facility-administered medications for this visit.    Allergies  Allergen Reactions  . Malt     . Milk-Related Compounds   . Shellfish Allergy   . Soy Allergy     Family History  Problem Relation Age of Onset  . Breast cancer Maternal Grandmother     Social History   Socioeconomic History  . Marital status: Married    Spouse name: Not on file  . Number of children: Not on file  . Years of education: 68  . Highest education level: Not on file  Occupational History  . Occupation: unemployed  . Occupation: homemaker  Tobacco Use  . Smoking status: Current Every Day Smoker    Packs/day: 1.00    Years: 10.00    Pack years: 10.00    Types: Cigarettes  . Smokeless tobacco: Never Used  Substance and Sexual Activity  . Alcohol use: No  . Drug use: No  . Sexual activity: Yes    Birth control/protection: Surgical    Comment: Vasectomy  Other Topics Concern  . Not on file  Social History Narrative  . Not on file   Social Determinants of Health   Financial Resource Strain:   . Difficulty of Paying Living Expenses: Not on file  Food Insecurity:   . Worried About Programme researcher, broadcasting/film/video in the Last Year: Not on file  . Ran Out of Food in the Last Year: Not on file  Transportation Needs:   .  Lack of Transportation (Medical): Not on file  . Lack of Transportation (Non-Medical): Not on file  Physical Activity:   . Days of Exercise per Week: Not on file  . Minutes of Exercise per Session: Not on file  Stress:   . Feeling of Stress : Not on file  Social Connections:   . Frequency of Communication with Friends and Family: Not on file  . Frequency of Social Gatherings with Friends and Family: Not on file  . Attends Religious Services: Not on file  . Active Member of Clubs or Organizations: Not on file  . Attends Archivist Meetings: Not on file  . Marital Status: Not on file  Intimate Partner Violence:   . Fear of Current or Ex-Partner: Not on file  . Emotionally Abused: Not on file  . Physically Abused: Not on file  . Sexually Abused: Not on file      Constitutional: Denies fever, malaise, fatigue, headache or abrupt weight changes.  HEENT: Denies eye pain, eye redness, ear pain, ringing in the ears, wax buildup, runny nose, nasal congestion, bloody nose, or sore throat. Respiratory: Denies difficulty breathing, shortness of breath, cough or sputum production.   Cardiovascular: Denies chest pain, chest tightness, palpitations or swelling in the hands or feet.  Gastrointestinal: Denies abdominal pain, bloating, constipation, diarrhea or blood in the stool.  GU: Denies urgency, frequency, pain with urination, burning sensation, blood in urine, odor or discharge. Musculoskeletal: Denies decrease in range of motion, difficulty with gait, muscle pain or joint pain and swelling.  Skin: Denies redness, rashes, lesions or ulcercations.  Neurological: Denies dizziness, difficulty with memory, difficulty with speech or problems with balance and coordination.  Psych: Denies anxiety, depression, SI/HI.  No other specific complaints in a complete review of systems (except as listed in HPI above).  Objective:   Physical Exam   There were no vitals taken for this visit. Wt Readings from Last 3 Encounters:  07/23/19 (!) 322 lb (146.1 kg)  06/11/19 (!) 332 lb (150.6 kg)  05/26/19 (!) 333 lb (151 kg)    General: Appears their stated age, well developed, well nourished in NAD. Skin: Warm, dry and intact. No rashes, lesions or ulcerations noted. HEENT: Head: normal shape and size; Eyes: sclera white, no icterus, conjunctiva pink, PERRLA and EOMs intact; Ears: Tm's gray and intact, normal light reflex; Nose: mucosa pink and moist, septum midline; Throat/Mouth: Teeth present, mucosa pink and moist, no exudate, lesions or ulcerations noted.  Neck:  Neck supple, trachea midline. No masses, lumps or thyromegaly present.  Cardiovascular: Normal rate and rhythm. S1,S2 noted.  No murmur, rubs or gallops noted. No JVD or BLE edema. No carotid bruits  noted. Pulmonary/Chest: Normal effort and positive vesicular breath sounds. No respiratory distress. No wheezes, rales or ronchi noted.  Abdomen: Soft and nontender. Normal bowel sounds. No distention or masses noted. Liver, spleen and kidneys non palpable. Musculoskeletal: Normal range of motion. No signs of joint swelling. No difficulty with gait.  Neurological: Alert and oriented. Cranial nerves II-XII grossly intact. Coordination normal.  Psychiatric: Mood and affect normal. Behavior is normal. Judgment and thought content normal.   EKG:  BMET    Component Value Date/Time   NA 136 04/07/2019 1117   K 4.4 04/07/2019 1117   CL 99 04/07/2019 1117   CO2 31 04/07/2019 1117   GLUCOSE 204 (H) 04/07/2019 1117   BUN 9 04/07/2019 1117   CREATININE 0.58 04/07/2019 1117   CALCIUM 8.9 04/07/2019 1117  GFRNONAA >60 10/18/2017 1746   GFRAA >60 10/18/2017 1746    Lipid Panel     Component Value Date/Time   CHOL 226 (H) 04/28/2019 1129   TRIG 185.0 (H) 04/28/2019 1129   HDL 29.40 (L) 04/28/2019 1129   CHOLHDL 8 04/28/2019 1129   VLDL 37.0 04/28/2019 1129   LDLCALC 160 (H) 04/28/2019 1129    CBC    Component Value Date/Time   WBC 12.7 (H) 04/28/2019 1129   RBC 5.16 (H) 04/28/2019 1129   HGB 13.7 04/28/2019 1129   HGB 13.2 10/06/2012 0955   HCT 43.3 04/28/2019 1129   HCT 42.0 10/06/2012 0955   PLT 338.0 04/28/2019 1129   PLT 309 10/06/2012 0955   MCV 83.9 04/28/2019 1129   MCV 79 (L) 10/06/2012 0955   MCH 26.3 10/18/2017 1746   MCHC 31.6 04/28/2019 1129   RDW 15.6 (H) 04/28/2019 1129   RDW 15.1 (H) 10/06/2012 0955   LYMPHSABS 0.6 (L) 10/18/2017 1746   MONOABS 0.3 10/18/2017 1746   EOSABS 0.0 10/18/2017 1746   BASOSABS 0.0 10/18/2017 1746    Hgb A1C Lab Results  Component Value Date   HGBA1C 8.5 (A) 07/23/2019           Assessment & Plan:   Preventative Health Maintenance:  Flu shot UTD Tetanus Pap smear UTD Encouraged her to consume a balanced diet and  exercise regimen Advised her to see a dentist annually Will check CBC, CMET, Lipid, A1C today  RTC in 3 months, follow up DM 2 [name], NP This visit occurred during the SARS-CoV-2 public health emergency.  Safety protocols were in place, including screening questions prior to the visit, additional usage of staff PPE, and extensive cleaning of exam room while observing appropriate contact time as indicated for disinfecting solutions.

## 2019-11-04 ENCOUNTER — Encounter: Payer: Self-pay | Admitting: Internal Medicine

## 2019-11-04 MED ORDER — PREDNISONE 10 MG PO TABS: 10.0000 mg | ORAL_TABLET | Freq: Every day | ORAL | 0 refills | Status: DC | PRN

## 2019-12-14 IMAGING — DX RIGHT KNEE - COMPLETE 4+ VIEW
4 series · 4 of 4 positions shown · non-contrast
Comparison: None.

CLINICAL DATA: Acute right knee pain, fall

EXAM:
RIGHT KNEE - COMPLETE 4+ VIEW

[knee ap]
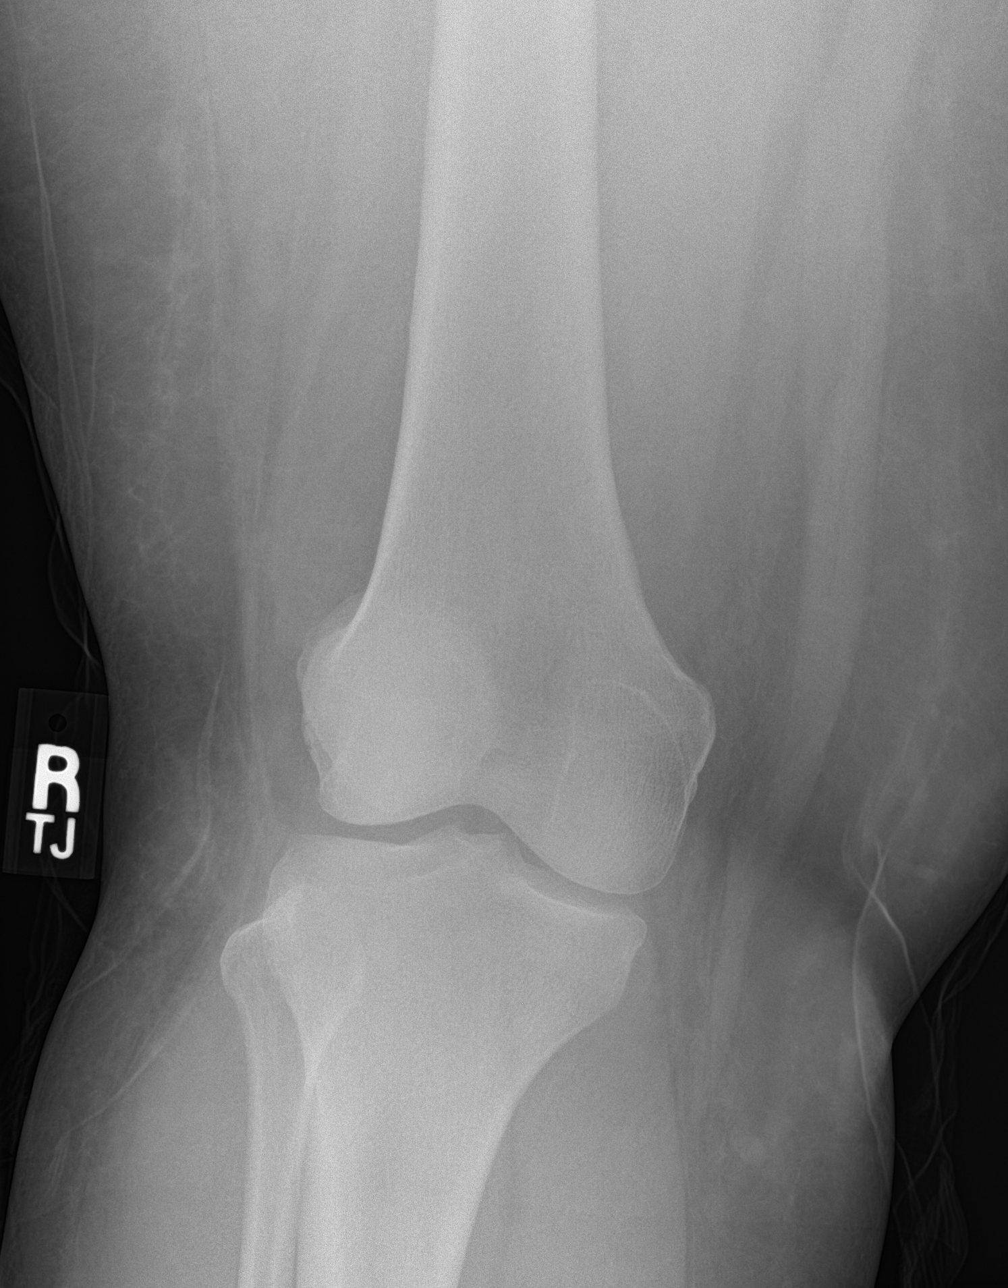

[knee lat]
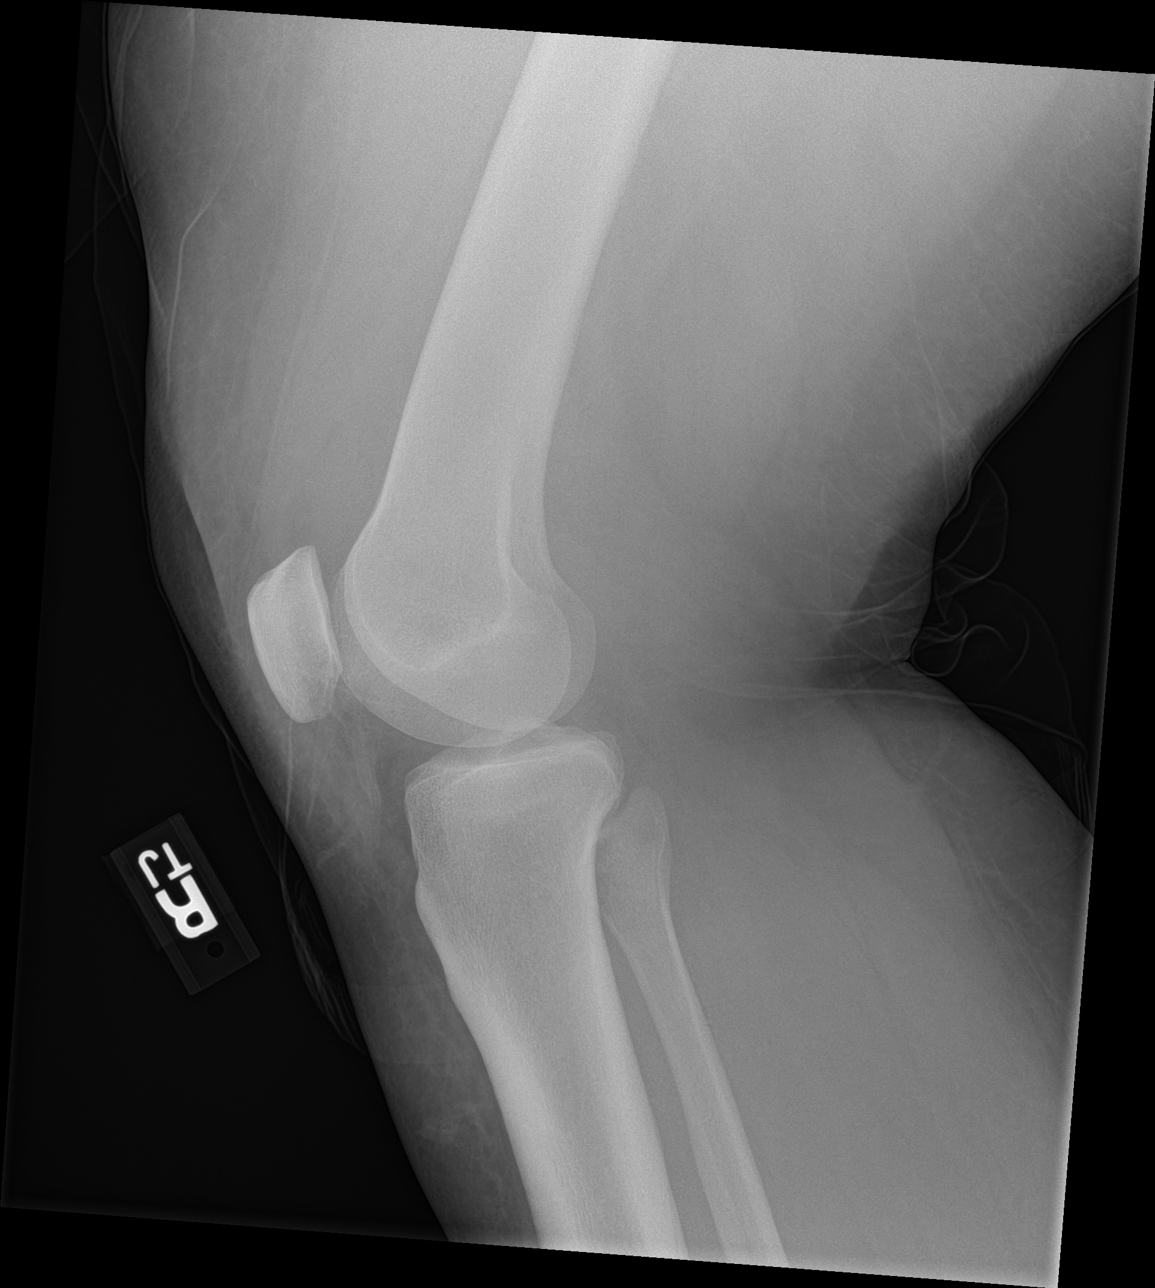

[knee obl (1 of 2)]
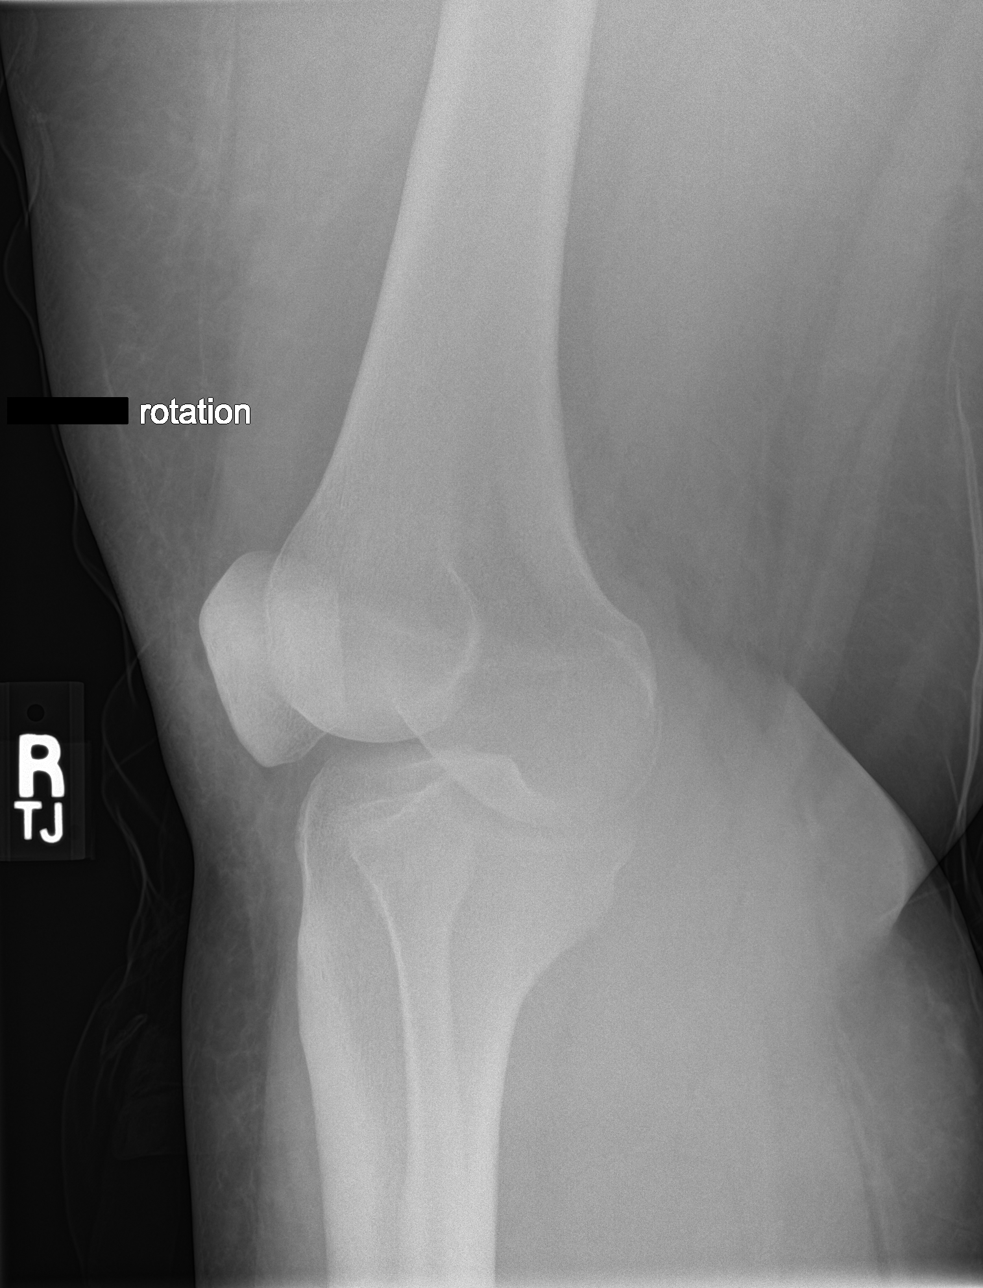

[knee obl (2 of 2)]
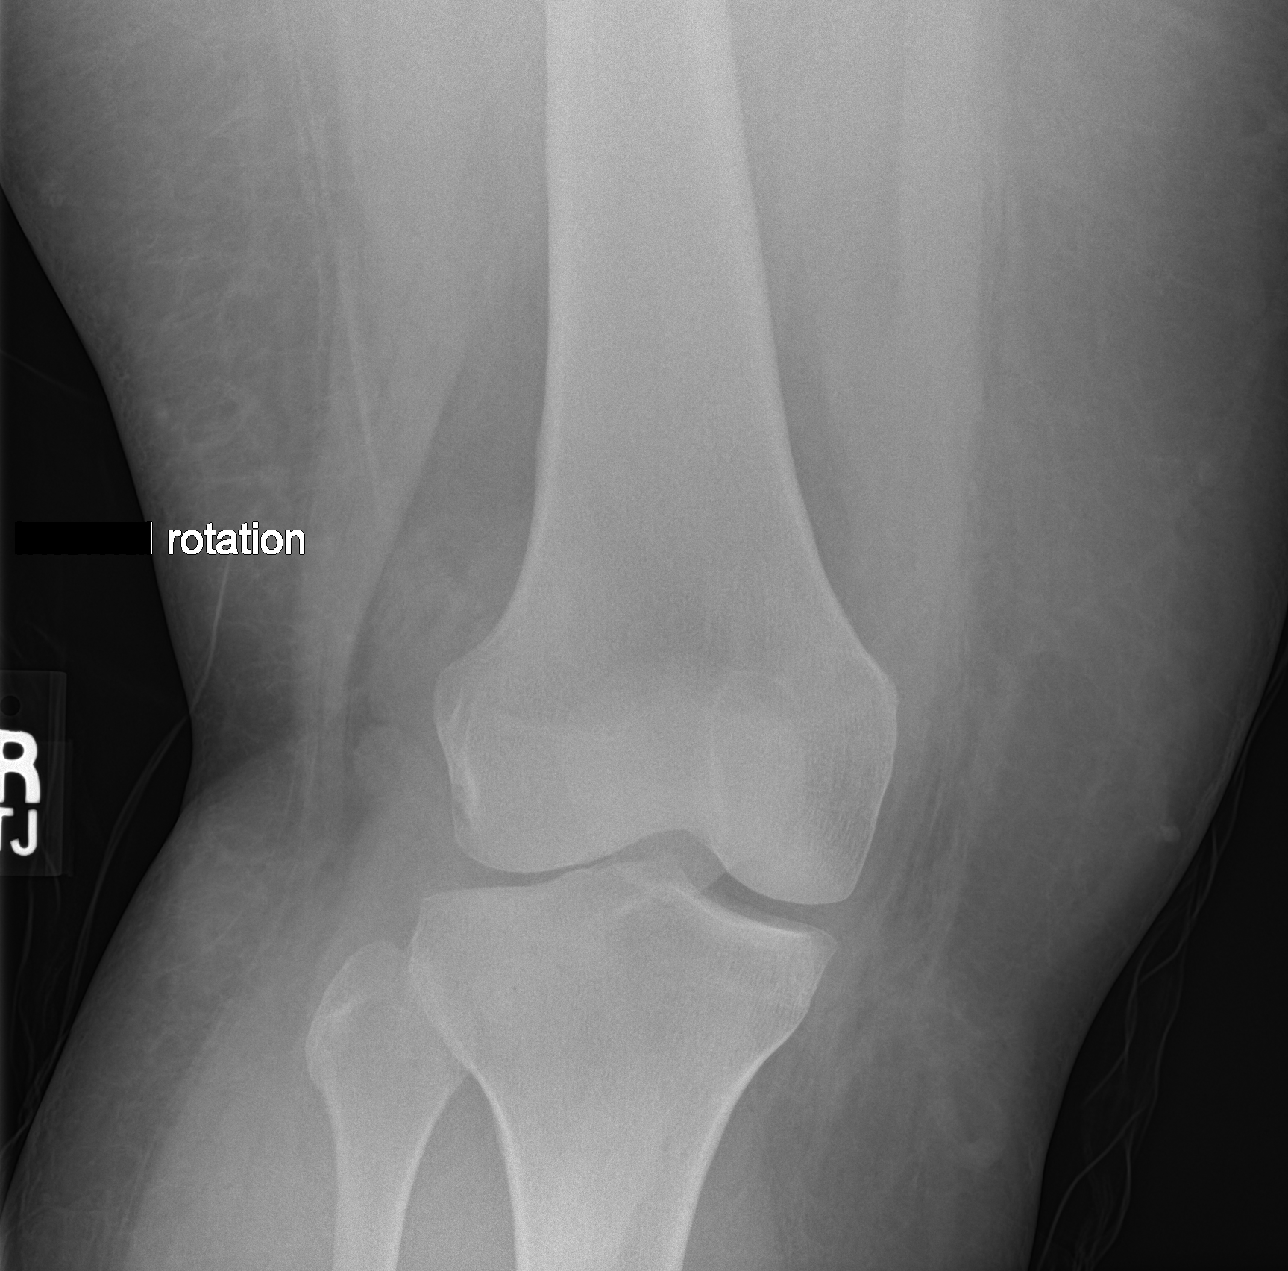

[4 of 4 positions shown; findings below may reference images not displayed]

FINDINGS: Normal alignment without acute osseous finding or fracture.
Preserved joint spaces. Mild anterior soft tissue swelling. Query
small effusion on the lateral view superiorly.
IMPRESSION: Anterior knee soft tissue swelling.  Question small effusion.

No acute fracture.

## 2019-12-24 ENCOUNTER — Encounter: Payer: Self-pay | Admitting: Internal Medicine

## 2019-12-24 ENCOUNTER — Other Ambulatory Visit: Payer: Self-pay | Admitting: Internal Medicine

## 2019-12-24 MED ORDER — PREDNISONE 10 MG PO TABS: 10.0000 mg | ORAL_TABLET | Freq: Every day | ORAL | 0 refills | Status: DC | PRN

## 2020-03-31 ENCOUNTER — Encounter: Payer: Self-pay | Admitting: Internal Medicine

## 2020-03-31 DIAGNOSIS — R609 Edema, unspecified: Secondary | ICD-10-CM

## 2020-03-31 MED ORDER — PREDNISONE 10 MG PO TABS: 10.0000 mg | ORAL_TABLET | Freq: Every day | ORAL | 0 refills | Status: DC | PRN

## 2020-05-10 ENCOUNTER — Telehealth: Payer: Self-pay

## 2020-05-10 ENCOUNTER — Telehealth: Payer: Self-pay | Admitting: Obstetrics and Gynecology

## 2020-05-10 ENCOUNTER — Other Ambulatory Visit: Payer: Self-pay | Admitting: Obstetrics and Gynecology

## 2020-05-10 MED ORDER — FLUCONAZOLE 150 MG PO TABS
150.0000 mg | ORAL_TABLET | Freq: Once | ORAL | 0 refills | Status: DC
Start: 2020-05-10 — End: 2020-10-31

## 2020-05-10 NOTE — Telephone Encounter (Signed)
Pt calling triage reporting she has a yeast infection, wanting to see if you could send something into [facility] pharmacy,

## 2020-05-10 NOTE — Telephone Encounter (Signed)
Rx diflucan eRxd.

## 2020-05-10 NOTE — Telephone Encounter (Signed)
Noted. Will order to arrive by apt date/time. 

## 2020-05-10 NOTE — Telephone Encounter (Signed)
Patient is scheduled for annual 06/14/20 and wants Mirena placed. Patient believes she will be on her cycle.

## 2020-05-10 NOTE — Progress Notes (Signed)
Diflucan Rx eRxd for yeast vag sx

## 2020-05-10 NOTE — Telephone Encounter (Signed)
What sx is she having? I will call in Rx prn sx.

## 2020-05-10 NOTE — Telephone Encounter (Signed)
Itching, some discharge, no odor. Denies uti sx. Ok to leave detailed msg if she doesn't answer.

## 2020-05-11 ENCOUNTER — Ambulatory Visit: Payer: Medicaid Other | Admitting: Obstetrics and Gynecology

## 2020-05-30 DIAGNOSIS — H6983 Other specified disorders of Eustachian tube, bilateral: Secondary | ICD-10-CM | POA: Diagnosis not present

## 2020-05-30 DIAGNOSIS — H9210 Otorrhea, unspecified ear: Secondary | ICD-10-CM | POA: Diagnosis not present

## 2020-05-30 DIAGNOSIS — H9211 Otorrhea, right ear: Secondary | ICD-10-CM | POA: Diagnosis not present

## 2020-06-13 DIAGNOSIS — H6980 Other specified disorders of Eustachian tube, unspecified ear: Secondary | ICD-10-CM | POA: Diagnosis not present

## 2020-06-13 DIAGNOSIS — H6981 Other specified disorders of Eustachian tube, right ear: Secondary | ICD-10-CM | POA: Diagnosis not present

## 2020-06-13 DIAGNOSIS — H9211 Otorrhea, right ear: Secondary | ICD-10-CM | POA: Diagnosis not present

## 2020-06-14 ENCOUNTER — Ambulatory Visit (INDEPENDENT_AMBULATORY_CARE_PROVIDER_SITE_OTHER): Payer: Medicaid Other | Admitting: Obstetrics and Gynecology

## 2020-06-14 ENCOUNTER — Other Ambulatory Visit (HOSPITAL_COMMUNITY)
Admission: RE | Admit: 2020-06-14 | Discharge: 2020-06-14 | Disposition: A | Discharge status: Home / Self Care | Payer: Medicaid Other | Source: Ambulatory Visit | Attending: Obstetrics and Gynecology | Admitting: Obstetrics and Gynecology

## 2020-06-14 ENCOUNTER — Other Ambulatory Visit: Payer: Self-pay

## 2020-06-14 ENCOUNTER — Encounter: Payer: Self-pay | Admitting: Obstetrics and Gynecology

## 2020-06-14 VITALS — BP 144/80 | Ht 65.0 in | Wt 332.0 lb

## 2020-06-14 DIAGNOSIS — Z01419 Encounter for gynecological examination (general) (routine) without abnormal findings: Secondary | ICD-10-CM | POA: Diagnosis not present

## 2020-06-14 DIAGNOSIS — Z30430 Encounter for insertion of intrauterine contraceptive device: Secondary | ICD-10-CM

## 2020-06-14 DIAGNOSIS — R87810 Cervical high risk human papillomavirus (HPV) DNA test positive: Secondary | ICD-10-CM

## 2020-06-14 DIAGNOSIS — Z1151 Encounter for screening for human papillomavirus (HPV): Secondary | ICD-10-CM | POA: Insufficient documentation

## 2020-06-14 DIAGNOSIS — Z124 Encounter for screening for malignant neoplasm of cervix: Secondary | ICD-10-CM | POA: Diagnosis not present

## 2020-06-14 MED ORDER — MISOPROSTOL 100 MCG PO TABS: 100.0000 ug | ORAL_TABLET | Freq: Once | ORAL | 0 refills | Status: DC

## 2020-06-14 NOTE — Telephone Encounter (Signed)
Patient is reschedule for 07/01/20 with AMS for IUD insertion and wt loss connsult . Extra time given for appointment

## 2020-06-14 NOTE — Patient Instructions (Signed)
I value your feedback and entrusting us with your care. If you get a [facility] patient survey, I would appreciate you taking the time to let us know about your experience today. Thank you!  As of October 01, 2019, your lab results will be released to your MyChart immediately, before I even have a chance to see them. Please give me time to review them and contact you if there are any abnormalities. Thank you for your patience.   [facility] [phone]  Instructions after IUD insertion  Most women experience no significant problems after insertion of an IUD, however minor cramping and spotting for a few days is common. Cramps may be treated with ibuprofen 800mg every 8 hours or Tylenol 650 mg every 4 hours. Contact [facility] immediately if you experience any of the following symptoms during the next week: temperature >99.6 degrees, worsening pelvic pain, abdominal pain, fainting, unusually heavy vaginal bleeding, foul vaginal discharge, or if you think you have expelled the IUD.  Nothing inserted in the vagina for 48 hours. You will be scheduled for a follow up visit in approximately four weeks.  You should check monthly to be sure you can feel the IUD strings in the upper vagina. If you are having a monthly period, try to check after each period. If you cannot feel the IUD strings,  contact [facility] immediately so we can do an exam to determine if the IUD has been expelled.   Please use backup protection until we can confirm the IUD is in place.  Call [facility] if you are exposed to or diagnosed with a sexually transmitted infection, as we will need to discuss whether it is safe for you to continue using an IUD.   

## 2020-06-14 NOTE — Progress Notes (Addendum)
Chief Complaint  Patient presents with  . Gynecologic Exam  . Contraception    Mirena insertion     HPI:      [name] is a 39 y.o. [phone] who LMP was Patient's last menstrual period was 06/03/2020 (exact date)., presents today for her annual examination.  Her menses are monthly, lasting 7 days, heavy flow, no BTB, mild to mod dysmen, improved somewhat with ibup. Would like another IUD for cycle control. Was amenorrheic when had Mirena before. Removed last yr due to expiration.   Sex activity: single partner, contraception - vasectomy.   Last Pap: 04/23/19 Results: no abnormalities/neg HPV DNA; 2018 with POS HPV DNA. Repeat pap due today.  She was at the beach this past wknd and now has a boil on mons, very tender, not draining.  There is a FH of breast cancer in her MGM, genetic testing not indicated. There is no FH of ovarian cancer. The patient does do self-breast exams.  Tobacco use: The patient currently smokes 1 packs of cigarettes per day for the past several years. Used to be 2 ppd Alcohol use: none  Drug use: none Exercise: min active  She does get adequate calcium but not Vitamin D in her diet. Labs with PCP.   Pt would like to lose wt. Has tried multiple diet changes, keto, etc. Can't lose it. Is interested in meds.   Past Medical History:  Diagnosis Date  . BMI 50.0-59.9, adult (HCC)   . History of Papanicolaou smear of cervix 04/2011   normal per pt.  . 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 COLPOSCOPY  09/28/2009  . TONSILLECTOMY AND ADENOIDECTOMY    . TYMPANOSTOMY TUBE PLACEMENT Bilateral     Family History  Problem Relation Age of Onset  . Breast cancer Maternal Grandmother        late years    Social History   Socioeconomic History  . Marital status: Married    Spouse name: Not on file  . Number of children: Not on file  . Years of education: 86  .  Highest education level: Not on file  Occupational History  . Occupation: unemployed  . Occupation: homemaker  Tobacco Use  . Smoking status: Current Every Day Smoker    Packs/day: 1.00    Years: 10.00    Pack years: 10.00    Types: Cigarettes  . Smokeless tobacco: Never Used  Vaping Use  . Vaping Use: Never used  Substance and Sexual Activity  . Alcohol use: No  . Drug use: No  . Sexual activity: Yes    Birth control/protection: Surgical    Comment: Vasectomy  Other Topics Concern  . Not on file  Social History Narrative  . Not on file   Social Determinants of Health   Financial Resource Strain:   . Difficulty of Paying Living Expenses: Not on file  Food Insecurity:   . Worried About Programme researcher, broadcasting/film/video in the Last Year: Not on file  . Ran Out of Food in the Last Year: Not on file  Transportation Needs:   . Lack of Transportation (Medical): Not on file  . Lack of Transportation (Non-Medical): Not on file  Physical Activity:   . Days of Exercise per Week: Not on file  . Minutes of Exercise per Session: Not on file  Stress:   . Feeling of Stress : Not  on file  Social Connections:   . Frequency of Communication with Friends and Family: Not on file  . Frequency of Social Gatherings with Friends and Family: Not on file  . Attends Religious Services: Not on file  . Active Member of Clubs or Organizations: Not on file  . Attends Banker Meetings: Not on file  . Marital Status: Not on file  Intimate Partner Violence:   . Fear of Current or Ex-Partner: Not on file  . Emotionally Abused: Not on file  . Physically Abused: Not on file  . Sexually Abused: Not on file     Current Outpatient Medications:  .  albuterol (PROVENTIL HFA;VENTOLIN HFA) 108 (90 Base) MCG/ACT inhaler, Inhale 2 puffs into the lungs every 6 (six) hours as needed for wheezing or shortness of breath., Disp: 1 Inhaler, Rfl: 0 .  EPINEPHrine (EPIPEN 2-PAK) 0.3 mg/0.3 mL IJ SOAJ injection,  Inject 0.3 mLs (0.3 mg total) into the muscle once., Disp: 2 Device, Rfl: 0 .  misoprostol (CYTOTEC) 100 MCG tablet, Take 1 tablet (100 mcg total) by mouth once for 1 dose. 1 hour before appt, Disp: 1 tablet, Rfl: 0  ROS:  Review of Systems  Constitutional: Negative for fatigue, fever and unexpected weight change.  Respiratory: Negative for cough, shortness of breath and wheezing.   Cardiovascular: Negative for chest pain, palpitations and leg swelling.  Gastrointestinal: Negative for blood in stool, constipation, diarrhea, nausea and vomiting.  Endocrine: Negative for cold intolerance, heat intolerance and polyuria.  Genitourinary: Negative for dyspareunia, dysuria, flank pain, frequency, genital sores, hematuria, menstrual problem, pelvic pain, urgency, vaginal bleeding, vaginal discharge and vaginal pain.  Musculoskeletal: Negative for back pain, joint swelling and myalgias.  Skin: Negative for rash.  Neurological: Negative for dizziness, syncope, light-headedness, numbness and headaches.  Hematological: Negative for adenopathy.  Psychiatric/Behavioral: Negative for agitation, confusion, sleep disturbance and suicidal ideas. The patient is not nervous/anxious.     Objective: BP (!) 144/80   Ht 5\' 5"  (1.651 m)   Wt (!) 332 lb (150.6 kg)   LMP 06/03/2020 (Exact Date)   BMI 55.25 kg/m    Physical Exam Constitutional:      Appearance: She is well-developed.  Genitourinary:     Vulva, vagina, uterus, right adnexa and left adnexa normal.     No vulval lesion or tenderness noted.        No vaginal discharge, erythema or tenderness.     No cervical motion tenderness or polyp.     IUD strings visualized.     Uterus is not enlarged or tender.     No right or left adnexal mass present.     Right adnexa not tender.     Left adnexa not tender.  Neck:     Thyroid: No thyromegaly.  Cardiovascular:     Rate and Rhythm: Normal rate and regular rhythm.     Heart sounds: Normal heart  sounds. No murmur heard.   Pulmonary:     Effort: Pulmonary effort is normal.     Breath sounds: Normal breath sounds.  Chest:     Breasts:        Right: No mass, nipple discharge, skin change or tenderness.        Left: No mass, nipple discharge, skin change or tenderness.  Abdominal:     Palpations: Abdomen is soft.     Tenderness: There is no abdominal tenderness. There is no guarding.  Musculoskeletal:        General:  Normal range of motion.     Cervical back: Normal range of motion.  Neurological:     General: No focal deficit present.     Mental Status: She is alert and oriented to person, place, and time.     Cranial Nerves: No cranial nerve deficit.  Skin:    General: Skin is warm and dry.  Psychiatric:        Mood and Affect: Mood normal.        Behavior: Behavior normal.        Thought Content: Thought content normal.        Judgment: Judgment normal.  Vitals reviewed.     Assessment/Plan: Encounter for annual routine gynecological examination  Cervical cancer screening - Plan: Cytology - PAP  Screening for HPV (human papillomavirus) - Plan: Cytology - PAP  Cervical high risk human papillomavirus (HPV) DNA test positive - Plan: Cytology - PAP; repeat today  Encounter for IUD insertion - Plan: misoprostol (CYTOTEC) 100 MCG tablet; unable to insert IUD due to closed cx os. Rx cytotec, RTO with MD/Dr. Bonney Aid for insertion. Can do with wt loss consult.  Skin abscess--mons. Abx not needed. Warm compresses. F/u prn.   Weight loss counseling--refer to Dr. Bonney Aid for wt loss med mgmt            GYN counsel adequate intake of calcium and vitamin D, diet and exercise     F/U  Return in about 1 week (around 06/21/2020) for with Dr. Bonney Aid for IUD insertion and wt loss connsult (may need extra time).  Cason Dabney B. Ramina Hulet, PA-C 06/14/2020 2:15 PM

## 2020-06-16 LAB — CYTOLOGY - PAP
Comment: NEGATIVE
Diagnosis: NEGATIVE
High risk HPV: NEGATIVE

## 2020-06-16 NOTE — Telephone Encounter (Signed)
Noted  

## 2020-07-01 ENCOUNTER — Ambulatory Visit: Payer: Medicaid Other | Admitting: Obstetrics and Gynecology

## 2020-07-04 ENCOUNTER — Encounter: Payer: Self-pay | Admitting: Internal Medicine

## 2020-07-04 ENCOUNTER — Telehealth (INDEPENDENT_AMBULATORY_CARE_PROVIDER_SITE_OTHER): Payer: Medicaid Other | Admitting: Internal Medicine

## 2020-07-04 DIAGNOSIS — J069 Acute upper respiratory infection, unspecified: Secondary | ICD-10-CM

## 2020-07-04 MED ORDER — ALBUTEROL SULFATE HFA 108 (90 BASE) MCG/ACT IN AERS
2.0000 | INHALATION_SPRAY | Freq: Four times a day (QID) | RESPIRATORY_TRACT | 0 refills | Status: DC | PRN
Start: 2020-07-04 — End: 2020-10-11

## 2020-07-04 MED ORDER — HYDROCODONE-HOMATROPINE 5-1.5 MG/5ML PO SYRP: 5.0000 mL | ORAL_SOLUTION | Freq: Three times a day (TID) | ORAL | 0 refills | Status: DC | PRN

## 2020-07-04 NOTE — Patient Instructions (Signed)

## 2020-07-04 NOTE — Progress Notes (Signed)
Virtual Visit via Video Note  I connected with Holly Garner on 07/04/20 at 11:30 AM EDT by a video enabled telemedicine application and verified that I am speaking with the correct person using two identifiers.  Location: Patient: Home Provider: Office  Persons participating in this video call: [name], NP and [name].  I discussed the limitations of evaluation and management by telemedicine and the availability of in person appointments. The patient expressed understanding and agreed to proceed.  History of Present Illness:  Pt reports headache, sore throat, cough, and chest congestion. This started 2 days ago. The headache is located in the back of her head and radiates into the top of her head. She is having some difficulty swallowing. The cough is productive of yellow/green mucous. She denies dizziness, eye pain, eye redness or discharge, ear pain, loss of taste/smell or SOB. She denies fever or chills but has had body aches. She has tried Ibuprofen, Dayquil and Nyquil with some relief. She has not had sick contacts or exposure to Covid that she is aware of. She did have a negative OTC test yesterday.  Past Medical History:  Diagnosis Date  . BMI 50.0-59.9, adult (HCC)   . History of Papanicolaou smear of cervix 04/2011   normal per pt.  . 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 albuterol (PROVENTIL HFA;VENTOLIN HFA) 108 (90 Base) MCG/ACT inhaler Inhale 2 puffs into the lungs every 6 (six) hours as needed for wheezing or shortness of breath. 1 Inhaler 0  . EPINEPHrine (EPIPEN 2-PAK) 0.3 mg/0.3 mL IJ SOAJ injection Inject 0.3 mLs (0.3 mg total) into the muscle once. 2 Device 0  . misoprostol (CYTOTEC) 100 MCG tablet Take 1 tablet (100 mcg total) by mouth once for 1 dose. 1 hour before appt 1 tablet 0   No current facility-administered medications for this visit.     Allergies  Allergen Reactions  . Malt   . Milk-Related Compounds   . Other Other (See Comments)  . Shellfish Allergy   . Soy Allergy     Family History  Problem Relation Age of Onset  . Breast cancer Maternal Grandmother        late years    Social History   Socioeconomic History  . Marital status: Married    Spouse name: Not on file  . Number of children: Not on file  . Years of education: 83  . Highest education level: Not on file  Occupational History  . Occupation: unemployed  . Occupation: homemaker  Tobacco Use  . Smoking status: Current Every Day Smoker    Packs/day: 1.00    Years: 10.00    Pack years: 10.00    Types: Cigarettes  . Smokeless tobacco: Never Used  Vaping Use  . Vaping Use: Never used  Substance and Sexual Activity  . Alcohol use: No  . Drug use: No  . Sexual activity: Yes    Birth control/protection: Surgical    Comment: Vasectomy  Other Topics Concern  . Not on file  Social History Narrative  . Not on file   Social Determinants of Health   Financial Resource Strain:   . Difficulty of Paying Living Expenses: Not on file  Food Insecurity:   . Worried About Programme researcher, broadcasting/film/video in the Last Year: Not on file  . Ran Out of Food in the Last Year: Not on file  Transportation Needs:   . Freight forwarder (Medical): Not on file  . Lack of Transportation (Non-Medical): Not on file  Physical Activity:   . Days of Exercise per Week: Not on file  . Minutes of Exercise per Session: Not on file  Stress:   . Feeling of Stress : Not on file  Social Connections:   . Frequency of Communication with Friends and Family: Not on file  . Frequency of Social Gatherings with Friends and Family: Not on file  . Attends Religious Services: Not on file  . Active Member of Clubs or Organizations: Not on file  . Attends Banker Meetings: Not on file  . Marital Status: Not on file  Intimate Partner Violence:   . Fear of Current or  Ex-Partner: Not on file  . Emotionally Abused: Not on file  . Physically Abused: Not on file  . Sexually Abused: Not on file     Constitutional: Pt reports body aches, headache. Denies fever, malaise, fatigue, or abrupt weight changes.  HEENT: Pt reports sore throat. Denies eye pain, eye redness, ear pain, ringing in the ears, wax buildup, runny nose, nasal congestion, bloody nose. Respiratory: Pt reports cough and chest congestion. Denies difficulty breathing, shortness of breath.   Cardiovascular: Denies chest pain, chest tightness, palpitations or swelling in the hands or feet.  Gastrointestinal: Denies abdominal pain, bloating, constipation, diarrhea or blood in the stool.  Neurological: Denies dizziness, difficulty with memory, difficulty with speech or problems with balance and coordination.    No other specific complaints in a complete review of systems (except as listed in HPI above).    Observations/Objective: Wt Readings from Last 3 Encounters:  06/14/20 (!) 332 lb (150.6 kg)  07/23/19 (!) 322 lb (146.1 kg)  06/11/19 (!) 332 lb (150.6 kg)    General: Appears her stated age, obese, in NAD. HEENT: Head: normal shape and size;  Nose: sounds congested ; Throat/Mouth: sounds hoarse Pulmonary/Chest: Normal effort. Dry cough noted. No respiratory distress.  Neurological: Alert and oriented.  BMET    Component Value Date/Time   NA 136 04/07/2019 1117   K 4.4 04/07/2019 1117   CL 99 04/07/2019 1117   CO2 31 04/07/2019 1117   GLUCOSE 204 (H) 04/07/2019 1117   BUN 9 04/07/2019 1117   CREATININE 0.58 04/07/2019 1117   CALCIUM 8.9 04/07/2019 1117   GFRNONAA >60 10/18/2017 1746   GFRAA >60 10/18/2017 1746    Lipid Panel     Component Value Date/Time   CHOL 226 (H) 04/28/2019 1129   TRIG 185.0 (H) 04/28/2019 1129   HDL 29.40 (L) 04/28/2019 1129   CHOLHDL 8 04/28/2019 1129   VLDL 37.0 04/28/2019 1129   LDLCALC 160 (H) 04/28/2019 1129    CBC    Component Value  Date/Time   WBC 12.7 (H) 04/28/2019 1129   RBC 5.16 (H) 04/28/2019 1129   HGB 13.7 04/28/2019 1129   HGB 13.2 10/06/2012 0955   HCT 43.3 04/28/2019 1129   HCT 42.0 10/06/2012 0955   PLT 338.0 04/28/2019 1129   PLT 309 10/06/2012 0955   MCV 83.9 04/28/2019 1129   MCV 79 (L) 10/06/2012 0955   MCH 26.3 10/18/2017 1746   MCHC 31.6 04/28/2019 1129   RDW 15.6 (H) 04/28/2019 1129   RDW 15.1 (H) 10/06/2012 0955   LYMPHSABS 0.6 (L) 10/18/2017 1746   MONOABS 0.3 10/18/2017 1746   EOSABS 0.0 10/18/2017 1746   BASOSABS 0.0 10/18/2017 1746    Hgb A1C  Lab Results  Component Value Date   HGBA1C 8.5 (A) 07/23/2019        Assessment and Plan:  Viral URI with Cough:  RX for Albuterol 1 puff Q4-6Hprn RX for Hycodan 5 ml Q8H prn for cough Encouraged rest, fluids, Tylenol Discussed the importance of self quarantine, frequent handwashing, social distancing and masking, even in the home If no improvement, she will retest in 2-3 days ER precautions discussed  Follow Up Instructions:    I discussed the assessment and treatment plan with the patient. The patient was provided an opportunity to ask questions and all were answered. The patient agreed with the plan and demonstrated an understanding of the instructions.   The patient was advised to call back or seek an in-person evaluation if the symptoms worsen or if the condition fails to improve as anticipated.     [name], NP

## 2020-10-11 ENCOUNTER — Other Ambulatory Visit: Payer: Self-pay | Admitting: Internal Medicine

## 2020-10-28 ENCOUNTER — Encounter: Payer: Self-pay | Admitting: Internal Medicine

## 2020-10-29 ENCOUNTER — Encounter: Payer: Self-pay | Admitting: Obstetrics and Gynecology

## 2020-10-30 NOTE — Telephone Encounter (Signed)
Yes, needs to make an appt

## 2020-10-31 ENCOUNTER — Other Ambulatory Visit: Payer: Self-pay | Admitting: Obstetrics and Gynecology

## 2020-10-31 MED ORDER — FLUCONAZOLE 150 MG PO TABS: 150.0000 mg | ORAL_TABLET | Freq: Once | ORAL | 0 refills | Status: AC

## 2020-10-31 NOTE — Progress Notes (Signed)
Rx RF diflucan for yeast vag sx 

## 2020-11-07 ENCOUNTER — Encounter: Payer: Self-pay | Admitting: Internal Medicine

## 2020-11-08 NOTE — Telephone Encounter (Signed)
Needs to be evaluated before medications are sent in

## 2020-11-09 NOTE — Telephone Encounter (Signed)
Could come in 5 days without symptoms if she has been vaccinated

## 2020-11-09 NOTE — Telephone Encounter (Signed)
Has to wait 14 days

## 2020-11-10 ENCOUNTER — Encounter: Payer: Self-pay | Admitting: Internal Medicine

## 2020-11-10 ENCOUNTER — Other Ambulatory Visit: Payer: Self-pay

## 2020-11-10 ENCOUNTER — Telehealth (INDEPENDENT_AMBULATORY_CARE_PROVIDER_SITE_OTHER): Payer: Medicaid Other | Admitting: Internal Medicine

## 2020-11-10 DIAGNOSIS — B001 Herpesviral vesicular dermatitis: Secondary | ICD-10-CM | POA: Diagnosis not present

## 2020-11-10 DIAGNOSIS — R52 Pain, unspecified: Secondary | ICD-10-CM

## 2020-11-10 DIAGNOSIS — R0989 Other specified symptoms and signs involving the circulatory and respiratory systems: Secondary | ICD-10-CM | POA: Diagnosis not present

## 2020-11-10 DIAGNOSIS — R509 Fever, unspecified: Secondary | ICD-10-CM | POA: Diagnosis not present

## 2020-11-10 DIAGNOSIS — R059 Cough, unspecified: Secondary | ICD-10-CM | POA: Diagnosis not present

## 2020-11-10 DIAGNOSIS — Z20822 Contact with and (suspected) exposure to covid-19: Secondary | ICD-10-CM

## 2020-11-10 DIAGNOSIS — R519 Headache, unspecified: Secondary | ICD-10-CM

## 2020-11-10 MED ORDER — PROMETHAZINE-DM 6.25-15 MG/5ML PO SYRP: 5.0000 mL | ORAL_SOLUTION | Freq: Four times a day (QID) | ORAL | 0 refills | Status: DC | PRN

## 2020-11-10 MED ORDER — VALACYCLOVIR HCL 1 G PO TABS
1000.0000 mg | ORAL_TABLET | Freq: Every day | ORAL | 0 refills | Status: DC
Start: 2020-11-10 — End: 2020-11-30

## 2020-11-10 NOTE — Progress Notes (Signed)
Virtual Visit via Video Note  I connected with Holly Garner on 11/10/20 at  9:15 AM EST by a video enabled telemedicine application and verified that I am speaking with the correct person using two identifiers.  Location: Patient: Home Provider: Office  Person's participating in this video call: Holly Reaper, NP-C and [name].   I discussed the limitations of evaluation and management by telemedicine and the availability of in person appointments. The patient expressed understanding and agreed to proceed.  History of Present Illness:  Pt reports fever blister. She noticed this a few days ago. She did have fever, chills, fatigue, body aches, headache, runny nose and cough 1 week ago after Covid exposure. Her headache is located behind her eyes. She describes the pain as pressure/throbbing. She denies dizziness or visual changes. She is blowing clear mucous out of her nose. The cough is productive of yellow mucous. She denies nasal congestion, ear pain, sore throat, loss of taste/smell, SOB or chest pain.  She did have a negative home Covid test. She has tried Abreva, Tylenol, Ibuprofen OTC with minimal relief of symptoms.  Past Medical History:  Diagnosis Date  . BMI 50.0-59.9, adult (HCC)   . History of Papanicolaou smear of cervix 04/2011   normal per pt.  . 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 EPINEPHrine (EPIPEN 2-PAK) 0.3 mg/0.3 mL IJ SOAJ injection Inject 0.3 mLs (0.3 mg total) into the muscle once. 2 Device 0  . HYDROcodone-homatropine (HYCODAN) 5-1.5 MG/5ML syrup Take 5 mLs by mouth every 8 (eight) hours as needed for cough. 120 mL 0  . misoprostol (CYTOTEC) 100 MCG tablet Take 1 tablet (100 mcg total) by mouth once for 1 dose. 1 hour before appt 1 tablet 0  . PROAIR HFA 108 (90 Base) MCG/ACT inhaler INHALE 2 PUFFS INTO THE LUNGS EVERY 6 HOURS AS NEEDED FOR WHEEZING OR  SHORTNESS OF BREATH 8.5 g 0   No current facility-administered medications for this visit.    Allergies  Allergen Reactions  . Malt   . Milk-Related Compounds   . Other Other (See Comments)  . Shellfish Allergy   . Soy Allergy     Family History  Problem Relation Age of Onset  . Breast cancer Maternal Grandmother        late years    Social History   Socioeconomic History  . Marital status: Married    Spouse name: Not on file  . Number of children: Not on file  . Years of education: 44  . Highest education level: Not on file  Occupational History  . Occupation: unemployed  . Occupation: homemaker  Tobacco Use  . Smoking status: Current Every Day Smoker    Packs/day: 1.00    Years: 10.00    Pack years: 10.00    Types: Cigarettes  . Smokeless tobacco: Never Used  Vaping Use  . Vaping Use: Never used  Substance and Sexual Activity  . Alcohol use: No  . Drug use: No  . Sexual activity: Yes    Birth control/protection: Surgical    Comment: Vasectomy  Other Topics Concern  . Not on file  Social History Narrative  . Not on file   Social Determinants of Health   Financial Resource Strain: Not on file  Food Insecurity: Not on file  Transportation Needs: Not on file  Physical Activity: Not on file  Stress: Not on  file  Social Connections: Not on file  Intimate Partner Violence: Not on file     Constitutional: Pt reports headache, fatigue, fever, chills and body aches. Denies malaise, or abrupt weight changes.  HEENT: Pt reports runny nose. Denies eye pain, eye redness, ear pain, ringing in the ears, wax buildup, nasal congestion, bloody nose, or sore throat. Respiratory: Pt reports cough. Denies difficulty breathing, shortness of breath, or sputum production.   Cardiovascular: Denies chest pain, chest tightness, palpitations or swelling in the hands or feet.  Skin: Pt reports fever blister. Denies redness, rashes.   No other specific complaints in a complete  review of systems (except as listed in HPI above).  Observations/Objective:   Wt Readings from Last 3 Encounters:  06/14/20 (!) 332 lb (150.6 kg)  07/23/19 (!) 322 lb (146.1 kg)  06/11/19 (!) 332 lb (150.6 kg)    General: In NAD. Skin: 2 scabbed fever blisters on top lip, 1 scabbed fever blister on bottom lip. ENT: Nose: congestion noted; Throat: hoarseness noted. Pulmonary/Chest: Normal effort. No respiratory distress.  Neurological: Alert and oriented.   BMET    Component Value Date/Time   NA 136 04/07/2019 1117   K 4.4 04/07/2019 1117   CL 99 04/07/2019 1117   CO2 31 04/07/2019 1117   GLUCOSE 204 (H) 04/07/2019 1117   BUN 9 04/07/2019 1117   CREATININE 0.58 04/07/2019 1117   CALCIUM 8.9 04/07/2019 1117   GFRNONAA >60 10/18/2017 1746   GFRAA >60 10/18/2017 1746    Lipid Panel     Component Value Date/Time   CHOL 226 (H) 04/28/2019 1129   TRIG 185.0 (H) 04/28/2019 1129   HDL 29.40 (L) 04/28/2019 1129   CHOLHDL 8 04/28/2019 1129   VLDL 37.0 04/28/2019 1129   LDLCALC 160 (H) 04/28/2019 1129    CBC    Component Value Date/Time   WBC 12.7 (H) 04/28/2019 1129   RBC 5.16 (H) 04/28/2019 1129   HGB 13.7 04/28/2019 1129   HGB 13.2 10/06/2012 0955   HCT 43.3 04/28/2019 1129   HCT 42.0 10/06/2012 0955   PLT 338.0 04/28/2019 1129   PLT 309 10/06/2012 0955   MCV 83.9 04/28/2019 1129   MCV 79 (L) 10/06/2012 0955   MCH 26.3 10/18/2017 1746   MCHC 31.6 04/28/2019 1129   RDW 15.6 (H) 04/28/2019 1129   RDW 15.1 (H) 10/06/2012 0955   LYMPHSABS 0.6 (L) 10/18/2017 1746   MONOABS 0.3 10/18/2017 1746   EOSABS 0.0 10/18/2017 1746   BASOSABS 0.0 10/18/2017 1746    Hgb A1C Lab Results  Component Value Date   HGBA1C 8.5 (A) 07/23/2019        Assessment and Plan:  Acute Headache, Runny Nose, Cough, Fever, Chills, Body Aches, Fatigue, Exposure to Covid 19:  She likely had a false negative test from testing too soon- consider retesting Encouraged rest and  fluids Continue Tylenol and Ibuprofen OTC for fever and body aches No indication for abx or steroids at this time Can take Mucinex as needed for cough and congestion RX for Promethazine DM cough syrup- sedation caution given  Fever Blister:  RX for Valtrex 1 gm PO daily x 5 days  Return precautions discussed  Follow Up Instructions:    I discussed the assessment and treatment plan with the patient. The patient was provided an opportunity to ask questions and all were answered. The patient agreed with the plan and demonstrated an understanding of the instructions.   The patient was advised to call  back or seek an in-person evaluation if the symptoms worsen or if the condition fails to improve as anticipated.    Holly Reaper, NP

## 2020-11-10 NOTE — Patient Instructions (Signed)
Cold Sore  A cold sore, also called a fever blister, is a small, fluid-filled sore that forms inside of the mouth or on the lips, gums, nose, chin, or cheeks. Cold sores can spread to other parts of the body, such as the eyes or fingers. Cold sores can spread from person to person (are contagious) until the sores crust over completely. Most cold sores go away within 2 weeks. What are the causes? Cold sores are caused by a virus (herpes simplex virus type 1, HSV-1). The virus can spread from person to person through close contact, such as through:  Kissing.  Touching the affected area.  Sharing personal items such as lip balm, razors, a drinking glass, or eating utensils. What increases the risk? You are more likely to develop this condition if you:  Are tired, stressed, or sick.  Are having your period (menstruating).  Are pregnant.  Take certain medicines.  Are out in cold weather or get too much sun. What are the signs or symptoms? Symptoms of a cold sore outbreak go through different stages. These are the stages of a cold sore:  Tingling, itching, or burning is felt 1-2 days before the outbreak.  Fluid-filled blisters appear on the lips, inside the mouth, on the nose, or on the cheeks.  The blisters start to ooze clear fluid.  The blisters dry up, and a yellow crust appears in their place.  The crust falls off. In some cases, other symptoms can develop during a cold sore outbreak. These can include:  Fever.  Sore throat.  Headache.  Muscle aches.  Swollen neck glands. How is this treated? There is no cure for cold sores or the virus that causes them. There is also no vaccine to prevent the virus. Most cold sores go away on their own without treatment within 2 weeks. Your doctor may prescribe medicines to:  Help with pain.  Keep the virus from growing.  Help you heal faster. Medicines may be in the form of creams, gels, pills, or a shot. Follow these  instructions at home: Medicines  Take or apply over-the-counter and prescription medicines only as told by your doctor.  Use a cotton-tip swab to apply creams or gels to your sores.  Ask your doctor if you can take lysine supplements. These may help with healing. Sore care  Do not touch the sores or pick the scabs.  Wash your hands often. Do not touch your eyes without washing your hands first.  Keep the sores clean and dry.  If told, put ice on the sores: ? Put ice in a plastic bag. ? Place a towel between your skin and the bag. ? Leave the ice on for 20 minutes, 2-3 times a day.   Eating and drinking  Eat a soft, bland diet. Avoid eating hot, cold, or salty foods. These can hurt your mouth.  Use a straw if it hurts to drink out of a glass.  Eat foods that have a lot of lysine in them. These include meat, fish, and dairy products.  Avoid sugary foods, chocolates, nuts, and grains. These foods have a high amount of a substance (arginine) that can cause the virus to grow. Lifestyle  Do not kiss, have oral sex, or share personal items until your sores heal.  Stress, poor sleep, and being out in the sun can trigger outbreaks. Make sure you: ? Do activities that help you relax, such as deep breathing exercises or meditation. ? Get enough sleep. ? Apply   sunscreen on your lips before you go out in the sun. Contact a doctor if:  You have symptoms for more than 2 weeks.  You have pus coming from the sores.  You have redness that is spreading.  You have pain or irritation in your eye.  You get sores on your genitals.  Your sores do not heal within 2 weeks.  You get cold sores often. Get help right away if:  You have a fever and your symptoms suddenly get worse.  You have a headache and confusion.  You have tiredness (fatigue).  You do not want to eat as much as normal (loss of appetite).  You have a stiff neck or are sensitive to light. Summary  A cold sore is  a small, fluid-filled sore that forms inside of the mouth or on the lips, gums, nose, chin, or cheeks.  Cold sores can spread from person to person (are contagious) until the sores crust over completely. Most cold sores go away within 2 weeks.  Wash your hands often. Do not touch your eyes without washing your hands first.  Do not kiss, have oral sex, or share personal items until your sores heal.  Contact a doctor if your sores do not heal within 2 weeks. This information is not intended to replace advice given to you by your health care provider. Make sure you discuss any questions you have with your health care provider. Document Revised: 01/28/2019 Document Reviewed: 03/10/2018 Elsevier Patient Education  2021 Elsevier Inc.  

## 2020-11-23 ENCOUNTER — Other Ambulatory Visit: Payer: Self-pay | Admitting: Internal Medicine

## 2020-11-28 ENCOUNTER — Encounter: Payer: Self-pay | Admitting: Internal Medicine

## 2020-11-29 NOTE — Telephone Encounter (Signed)
[facility] Night - Client TELEPHONE ADVICE RECORD AccessNurse Patient Name: Holly Garner Gender: Female DOB: 1981-07-13 Age: 40 Y 3 M 6 D Return Phone Number: [phone] (Primary) Address[place] [address] Client [facility] Night - Client Client Site [facility] - Night Physician [name] - NP Contact Type Call Who Is Calling Patient / Member / Family / Caregiver Call Type Triage / Clinical Relationship To Patient Self Return Phone Number [phone] (Primary) Chief Complaint Sore Throat Reason for Call Symptomatic / Request for Health Information Initial Comment Caller states she needs to speak with nurse about her throat. She has no voice. Translation No Nurse Assessment Nurse: Mariah Milling, RN, Diana Eves Date/Time Lamount Cohen Time): 11/28/2020 5:09:48 PM Confirm and document reason for call. If symptomatic, describe symptoms. ---Caller states she needs to speak with nurse about her throat. She has no voice. throat does not hurt but has lost her voice. no fever. caller coughing to clear her throat. no other symptoms. Does the patient have any new or worsening symptoms? ---Yes Will a triage be completed? ---Yes Related visit to physician within the last 2 weeks? ---No Does the PT have any chronic conditions? (i.e. diabetes, asthma, this includes High risk factors for pregnancy, etc.) ---Yes List chronic conditions. ---htn diabetic type 2 Is the patient pregnant or possibly pregnant? (Ask all females between the ages of 36-55) ---No Is this a behavioral health or substance abuse call? ---No Guidelines Guideline Title Affirmed Question Affirmed Notes Nurse Date/Time (Eastern Time) Hoarseness [1] MODERATE to SEVERE hoarseness (e.g., interferes with work) AND [2] professional voice user (e.g., Lobbyist, singer, Runner, broadcasting/film/video) (Exception: current common cold or mild URI  symptoms) Mariah Milling, RN, Diana Eves 11/28/2020 5:12:06 PM PLEASE NOTE: All timestamps contained within this report are represented as [place] Standard Time. CONFIDENTIALTY NOTICE: This fax transmission is intended only for the addressee. It contains information that is legally privileged, confidential or otherwise protected from use or disclosure. If you are not the intended recipient, you are strictly prohibited from reviewing, disclosing, copying using or disseminating any of this information or taking any action in reliance on or regarding this information. If you have received this fax in error, please notify us immediately by telephone so that we can arrange for its return to [place]. Phone: [phone], Toll-Free: [phone], Fax: [phone] Page: 2 of 2 Call Id: [id number] Disp. Time Lamount Cohen Time) Disposition Final User 11/28/2020 5:18:02 PM SEE PCP WITHIN 3 DAYS Yes Mariah Milling, RN, Diana Eves Caller Disagree/Comply Comply Caller Understands Yes PreDisposition Did not know what to do Care Advice Given Per Guideline SEE PCP WITHIN 3 DAYS: * You need to be seen within 2 or 3 days. REST YOUR VOICE: * Avoid yelling or talking loudly. * Also avoid throat-clearing. * Talk as little as possible or write notes for a few days. HUMIDIFIER: * If the air is dry, use a humidifier in the bedroom. DRINK WARM LIQUIDS: * Drink warm soothing liquids. * Hot water with honey is good. Hot tea with sugar and lemon is also good. * Or, you can suck on hard candy or cough drops. CARE ADVICE given per Hoarseness (Adult) guideline. * You become worse * Severe difficulty swallowing occurs * Difficulty breathing CALL BACK IF: * Talking and even throat clearing can strain the vocal cords.

## 2020-11-29 NOTE — Telephone Encounter (Signed)
I spoke with pt;pt has scratchy throat and throat gets tender when trying to clear throat; has head congestion and ears are stopped up but no earache. Runny nose and prod cough with yellow phlegm. No other covid symptoms. Pt does not want to go to [facility] to have a face to face evaluation and pt wants note sent to Pamala Hurry NP to get her input. Pt request cb ASAP. 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

## 2020-11-30 ENCOUNTER — Telehealth (INDEPENDENT_AMBULATORY_CARE_PROVIDER_SITE_OTHER): Payer: Medicaid Other | Admitting: Family Medicine

## 2020-11-30 ENCOUNTER — Encounter: Payer: Self-pay | Admitting: Internal Medicine

## 2020-11-30 ENCOUNTER — Other Ambulatory Visit: Payer: Self-pay | Admitting: Internal Medicine

## 2020-11-30 ENCOUNTER — Encounter: Payer: Self-pay | Admitting: Family Medicine

## 2020-11-30 VITALS — Ht 65.0 in

## 2020-11-30 DIAGNOSIS — H9211 Otorrhea, right ear: Secondary | ICD-10-CM

## 2020-11-30 DIAGNOSIS — J069 Acute upper respiratory infection, unspecified: Secondary | ICD-10-CM | POA: Diagnosis not present

## 2020-11-30 DIAGNOSIS — J040 Acute laryngitis: Secondary | ICD-10-CM

## 2020-11-30 MED ORDER — BENZONATATE 100 MG PO CAPS: 200.0000 mg | ORAL_CAPSULE | Freq: Two times a day (BID) | ORAL | 0 refills | Status: AC | PRN

## 2020-11-30 MED ORDER — FLUTICASONE PROPIONATE 50 MCG/ACT NA SUSP: 1.0000 | Freq: Two times a day (BID) | NASAL | 0 refills | Status: DC

## 2020-11-30 NOTE — Progress Notes (Signed)
Virtual Visit via Video Note   I connected with Holly Garner on 11/30/20 by a video enabled telemedicine application and verified that I am speaking with the correct person using two identifiers.  Location patient: home Location provider:work office Persons participating in the virtual visit: patient, provider  I discussed the limitations of evaluation and management by telemedicine and the availability of in person appointments. The patient expressed understanding and agreed to proceed.  Chief Complaint  Patient presents with  . no voice  . Cough   HPI: Holly Garner is a 40 yo female with hx of DM 2, seasonal allergies, and hypertension complaining of 4 days of nasal congestion, rhinorrhea, postnasal drainage, dysphonia, and cough. Productive cough with clear sputum, denies hemoptysis.  She has not noted fever, changes in appetite, or unusual fatigue. Negative for dyspnea, CP, and wheezing. Sore throat with episodes of cough. Dysphonia evaluated with talking. She is a Runner, broadcasting/film/video.  No sick contact. She received her first COVID-19 vaccine 6 days ago.  Right ear clear drainage and fullness like sensation bilateral. S/P tympanostomy and tube placement. Negative for earache.   ROS: See pertinent positives and negatives per HPI.  Past Medical History:  Diagnosis Date  . BMI 50.0-59.9, adult (HCC)   . History of Papanicolaou smear of cervix 04/2011   normal per pt.  . 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 COLPOSCOPY  09/28/2009  . TONSILLECTOMY AND ADENOIDECTOMY    . TYMPANOSTOMY TUBE PLACEMENT Bilateral     Family History  Problem Relation Age of Onset  . Breast cancer Maternal Grandmother        late years    Social History   Socioeconomic History  . Marital status: Married    Spouse name: Not on file  . Number of children: Not on file  . Years of education: 37  . Highest education  level: Not on file  Occupational History  . Occupation: unemployed  . Occupation: homemaker  Tobacco Use  . Smoking status: Current Every Day Smoker    Packs/day: 1.00    Years: 10.00    Pack years: 10.00    Types: Cigarettes  . Smokeless tobacco: Never Used  Vaping Use  . Vaping Use: Never used  Substance and Sexual Activity  . Alcohol use: No  . Drug use: No  . Sexual activity: Yes    Birth control/protection: Surgical    Comment: Vasectomy  Other Topics Concern  . Not on file  Social History Narrative  . Not on file   Social Determinants of Health   Financial Resource Strain: Not on file  Food Insecurity: Not on file  Transportation Needs: Not on file  Physical Activity: Not on file  Stress: Not on file  Social Connections: Not on file  Intimate Partner Violence: Not on file   Current Outpatient Medications:  .  EPINEPHrine (EPIPEN 2-PAK) 0.3 mg/0.3 mL IJ SOAJ injection, Inject 0.3 mLs (0.3 mg total) into the muscle once., Disp: 2 Device, Rfl: 0 .  misoprostol (CYTOTEC) 100 MCG tablet, Take 1 tablet (100 mcg total) by mouth once for 1 dose. 1 hour before appt, Disp: 1 tablet, Rfl: 0 .  PROAIR HFA 108 (90 Base) MCG/ACT inhaler, INHALE 2 PUFFS INTO THE LUNGS EVERY 6 HOURS AS NEEDED FOR WHEEZING OR SHORTNESS OF BREATH, Disp: 8.5 g, Rfl: 0 .  promethazine-dextromethorphan (PROMETHAZINE-DM) 6.25-15 MG/5ML syrup, Take 5 mLs by  mouth 4 (four) times daily as needed., Disp: 118 mL, Rfl: 0 .  valACYclovir (VALTREX) 1000 MG tablet, Take 1 tablet (1,000 mg total) by mouth daily., Disp: 5 tablet, Rfl: 0  EXAM:  VITALS per patient if applicable:Ht 5\' 5"  (1.651 m)   BMI 55.25 kg/m   GENERAL: alert, oriented, appears well and in no acute distress  HEENT: atraumatic, conjunctiva clear, no obvious abnormalities on inspection of external nose and ears Nasal congestion,rhinorrhea,and mild dysphonia. No stridor.  NECK: normal movements of the head and neck  LUNGS: on inspection no  signs of respiratory distress, breathing rate appears normal, no obvious gross SOB, gasping or wheezing. Non productive cough a couple times during her visit.  CV: no obvious cyanosis  MS: moves all visible extremities without noticeable abnormality  PSYCH/NEURO: pleasant and cooperative, no obvious depression or anxiety, speech and thought processing grossly intact  ASSESSMENT AND PLAN:  Discussed the following assessment and plan:  URI, acute - Plan: benzonatate (TESSALON) 100 MG capsule, fluticasone (FLONASE) 50 MCG/ACT nasal spray Symptoms suggest a viral illness. COVID-19 test reported as negative. Monitor for new symptoms. Symptomatic treatment recommended for now.  Tobacco use can aggravate symptoms + cough and congestion may last longer. Encouraged smoking cessation.  Laryngitis, acute We discussed diagnosis, prognosis, treatment options. Voice rest recommended. Instructed about warning signs.  Drainage from right ear Reporting tube in place. Monitor for drainage changes (color and odor). Decreased hearing , bilateral, could be related to eustachian tube dysfunction. I do not not think antibiotic is needed at this time. Instructed about warning signs.  We discussed possible serious and likely etiologies, options for evaluation and workup. She understands limitations of telemedicine visit vs in person visit, treatment, treatment risks and precautions. She needs to call back or seek an in-person evaluation if the symptoms worsen or if the condition fails to improve as anticipated. I discussed the assessment and treatment plan with the patient. Holly Garner was provided an opportunity to ask questions and all were answered. She agreed with the plan and demonstrated an understanding of the instructions.  Return if symptoms worsen or fail to improve.   Kyler Lerette Vear Clock, MD

## 2020-12-01 MED ORDER — PROAIR HFA 108 (90 BASE) MCG/ACT IN AERS: 1.0000 | INHALATION_SPRAY | Freq: Four times a day (QID) | RESPIRATORY_TRACT | 0 refills | Status: DC | PRN

## 2020-12-02 ENCOUNTER — Telehealth: Payer: Medicaid Other | Admitting: Family Medicine

## 2021-04-07 ENCOUNTER — Encounter: Payer: Medicaid Other | Admitting: Primary Care

## 2021-04-17 DIAGNOSIS — J4520 Mild intermittent asthma, uncomplicated: Secondary | ICD-10-CM | POA: Diagnosis not present

## 2021-04-17 DIAGNOSIS — H66011 Acute suppurative otitis media with spontaneous rupture of ear drum, right ear: Secondary | ICD-10-CM | POA: Diagnosis not present

## 2021-04-17 DIAGNOSIS — Z9622 Myringotomy tube(s) status: Secondary | ICD-10-CM | POA: Diagnosis not present

## 2021-04-17 DIAGNOSIS — Z6841 Body Mass Index (BMI) 40.0 and over, adult: Secondary | ICD-10-CM | POA: Diagnosis not present

## 2021-04-17 NOTE — Progress Notes (Deleted)
    Lorre Munroe, NP   No chief complaint on file.   HPI:      [name] is a 40 y.o. [phone] whose LMP was No LMP recorded., presents today for ***  There is a FH of breast cancer in her MGM, genetic testing not indicated. There is no FH of ovarian cancer. The patient does do self-breast exams.  Past Medical History:  Diagnosis Date   BMI 50.0-59.9, adult (HCC)    History of Papanicolaou smear of cervix 04/2011   normal per pt.   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   hgsil    Past Surgical History:  Procedure Laterality Date   COLPOSCOPY  09/28/2009   TONSILLECTOMY AND ADENOIDECTOMY     TYMPANOSTOMY TUBE PLACEMENT Bilateral     Family History  Problem Relation Age of Onset   Breast cancer Maternal Grandmother        late years    Social History   Socioeconomic History   Marital status: Married    Spouse name: Not on file   Number of children: Not on file   Years of education: 12   Highest education level: Not on file  Occupational History   Occupation: unemployed   Occupation: homemaker  Tobacco Use   Smoking status: Every Day    Packs/day: 1.00    Years: 10.00    Pack years: 10.00    Types: Cigarettes   Smokeless tobacco: Never  Vaping Use   Vaping Use: Never used  Substance and Sexual Activity   Alcohol use: No   Drug use: No   Sexual activity: Yes    Birth control/protection: Surgical    Comment: Vasectomy  Other Topics Concern   Not on file  Social History Narrative   Not on file   Social Determinants of Health   Financial Resource Strain: Not on file  Food Insecurity: Not on file  Transportation Needs: Not on file  Physical Activity: Not on file  Stress: Not on file  Social Connections: Not on file  Intimate Partner Violence: Not on file    Outpatient Medications Prior to Visit  Medication Sig Dispense Refill   EPINEPHrine (EPIPEN 2-PAK) 0.3 mg/0.3 mL IJ SOAJ injection Inject 0.3 mLs (0.3 mg total)  into the muscle once. 2 Device 0   fluticasone (FLONASE) 50 MCG/ACT nasal spray Place 1 spray into both nostrils 2 (two) times daily. 16 g 0   PROAIR HFA 108 (90 Base) MCG/ACT inhaler Inhale 1-2 puffs into the lungs every 6 (six) hours as needed for wheezing or shortness of breath. 8.5 g 0   No facility-administered medications prior to visit.      ROS:  Review of Systems BREAST: No symptoms   OBJECTIVE:   Vitals:  There were no vitals taken for this visit.  Physical Exam  Results: No results found for this or any previous visit (from the past 24 hour(s)).   Assessment/Plan: No diagnosis found.    No orders of the defined types were placed in this encounter.     No follow-ups on file.  Jahnessa Vanduyn B. Nasiir Monts, PA-C 04/17/2021 4:00 PM

## 2021-04-18 ENCOUNTER — Ambulatory Visit: Payer: Medicaid Other | Admitting: Obstetrics and Gynecology

## 2021-04-21 ENCOUNTER — Encounter: Payer: Self-pay | Admitting: Obstetrics and Gynecology

## 2021-04-21 ENCOUNTER — Other Ambulatory Visit: Payer: Self-pay

## 2021-04-21 ENCOUNTER — Ambulatory Visit (INDEPENDENT_AMBULATORY_CARE_PROVIDER_SITE_OTHER): Payer: Medicaid Other | Admitting: Obstetrics and Gynecology

## 2021-04-21 VITALS — BP 140/90 | Ht 65.0 in | Wt 306.0 lb

## 2021-04-21 DIAGNOSIS — L02213 Cutaneous abscess of chest wall: Secondary | ICD-10-CM | POA: Diagnosis not present

## 2021-04-21 MED ORDER — DOXYCYCLINE HYCLATE 100 MG PO CAPS: 100.0000 mg | ORAL_CAPSULE | Freq: Two times a day (BID) | ORAL | 0 refills | Status: DC

## 2021-04-21 NOTE — Progress Notes (Signed)
Lorre Munroe, NP   Chief Complaint  Patient presents with   Breast Exam    Knot under right breast, tender found Monday night    HPI:      [name] is a 40 y.o. [id number] whose LMP was Patient's last menstrual period was 03/24/2021 (approximate)., presents today for tender area under RT breast for 4 days. No drainage, no change in size. Hx of boils so pt thinks it might be that but doesn't feel the same. On augmentin since last wk for ear infection.  No trauma, nipple d/c. Hx of RT breast mass 12/13 with cat 4 mammo; bx showed mild duct from recent pregnancy; mass resolved.  FH breast cancer in her MGM, genetic testing not indicated  Past Medical History:  Diagnosis Date   BMI 50.0-59.9, adult (HCC)    History of Papanicolaou smear of cervix 04/2011   normal per pt.   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   hgsil    Past Surgical History:  Procedure Laterality Date   COLPOSCOPY  09/28/2009   TONSILLECTOMY AND ADENOIDECTOMY     TYMPANOSTOMY TUBE PLACEMENT Bilateral     Family History  Problem Relation Age of Onset   Breast cancer Maternal Grandmother        late years    Social History   Socioeconomic History   Marital status: Married    Spouse name: Not on file   Number of children: Not on file   Years of education: 12   Highest education level: Not on file  Occupational History   Occupation: unemployed   Occupation: homemaker  Tobacco Use   Smoking status: Every Day    Packs/day: 1.00    Years: 10.00    Pack years: 10.00    Types: Cigarettes   Smokeless tobacco: Never  Vaping Use   Vaping Use: Never used  Substance and Sexual Activity   Alcohol use: No   Drug use: No   Sexual activity: Yes    Birth control/protection: Surgical    Comment: Vasectomy  Other Topics Concern   Not on file  Social History Narrative   Not on file   Social Determinants of Health   Financial Resource Strain: Not on file  Food Insecurity:  Not on file  Transportation Needs: Not on file  Physical Activity: Not on file  Stress: Not on file  Social Connections: Not on file  Intimate Partner Violence: Not on file    Outpatient Medications Prior to Visit  Medication Sig Dispense Refill   amoxicillin-clavulanate (AUGMENTIN) 875-125 MG tablet      EPINEPHrine (EPIPEN 2-PAK) 0.3 mg/0.3 mL IJ SOAJ injection Inject 0.3 mLs (0.3 mg total) into the muscle once. 2 Device 0   fluconazole (DIFLUCAN) 150 MG tablet Take by mouth.     neomycin-polymyxin-hydrocortisone (CORTISPORIN) 3.5-10000-1 OTIC suspension SMARTSIG:In Ear(s)     PROAIR HFA 108 (90 Base) MCG/ACT inhaler Inhale 1-2 puffs into the lungs every 6 (six) hours as needed for wheezing or shortness of breath. 8.5 g 0   fluticasone (FLONASE) 50 MCG/ACT nasal spray Place 1 spray into both nostrils 2 (two) times daily. 16 g 0   No facility-administered medications prior to visit.      ROS:  Review of Systems  Constitutional:  Negative for fever.  Gastrointestinal:  Negative for blood in stool, constipation, diarrhea, nausea and vomiting.  Genitourinary:  Negative for dyspareunia, dysuria, flank pain, frequency, hematuria, urgency, vaginal bleeding, vaginal  discharge and vaginal pain.  Musculoskeletal:  Negative for back pain.  Skin:  Positive for wound. Negative for rash.  BREAST: No symptoms   OBJECTIVE:   Vitals:  BP 140/90   Ht 5\' 5"  (1.651 m)   Wt (!) 306 lb (138.8 kg)   LMP 03/24/2021 (Approximate)   BMI 50.92 kg/m   Physical Exam Vitals reviewed.  Pulmonary:     Effort: Pulmonary effort is normal.  Chest:  Breasts:    Breasts are symmetrical.     Right: No inverted nipple, mass, nipple discharge, skin change or tenderness.     Left: No inverted nipple, mass, nipple discharge, skin change or tenderness.    Musculoskeletal:        General: Normal range of motion.     Cervical back: Normal range of motion.  Skin:    General: Skin is warm and dry.   Neurological:     General: No focal deficit present.     Mental Status: She is alert and oriented to person, place, and time.     Cranial Nerves: No cranial nerve deficit.  Psychiatric:        Mood and Affect: Mood normal.        Behavior: Behavior normal.        Thought Content: Thought content normal.        Judgment: Judgment normal.    Assessment/Plan: Cutaneous abscess of chest wall - Plan: doxycycline (VIBRAMYCIN) 100 MG capsule; pos sx and skin exam; neg breast exam. Rx doxy, warm compresses. F/u prn. If sx worsen, may need gen surg ref for I&D prn.    Meds ordered this encounter  Medications   doxycycline (VIBRAMYCIN) 100 MG capsule    Sig: Take 1 capsule (100 mg total) by mouth 2 (two) times daily for 14 days.    Dispense:  28 capsule    Refill:  0    Order Specific Question:   Supervising Provider    Answer:   05/24/2021 Nadara Mustard     Return if symptoms worsen or fail to improve.  Jadriel Saxer B. Domnick Chervenak, PA-C 04/21/2021 10:59 AM

## 2021-04-21 NOTE — Patient Instructions (Signed)
I value your feedback and you entrusting us with your care. If you get a [facility] patient survey, I would appreciate you taking the time to let us know about your experience today. Thank you! ? ? ?

## 2021-05-03 ENCOUNTER — Telehealth: Payer: Self-pay

## 2021-05-03 ENCOUNTER — Encounter: Payer: Self-pay | Admitting: Emergency Medicine

## 2021-05-03 ENCOUNTER — Other Ambulatory Visit: Payer: Self-pay

## 2021-05-03 ENCOUNTER — Emergency Department: Payer: Medicaid Other

## 2021-05-03 ENCOUNTER — Observation Stay
Admission: EM | Admit: 2021-05-03 | Discharge: 2021-05-03 | DRG: 871 | Discharge status: Against Medical Advice | Payer: Medicaid Other | Attending: Internal Medicine | Admitting: Internal Medicine

## 2021-05-03 DIAGNOSIS — Z20822 Contact with and (suspected) exposure to covid-19: Secondary | ICD-10-CM | POA: Diagnosis present

## 2021-05-03 DIAGNOSIS — L03313 Cellulitis of chest wall: Secondary | ICD-10-CM | POA: Diagnosis present

## 2021-05-03 DIAGNOSIS — Z91013 Allergy to seafood: Secondary | ICD-10-CM

## 2021-05-03 DIAGNOSIS — Z79899 Other long term (current) drug therapy: Secondary | ICD-10-CM

## 2021-05-03 DIAGNOSIS — A419 Sepsis, unspecified organism: Principal | ICD-10-CM | POA: Diagnosis present

## 2021-05-03 DIAGNOSIS — E785 Hyperlipidemia, unspecified: Secondary | ICD-10-CM | POA: Diagnosis not present

## 2021-05-03 DIAGNOSIS — I252 Old myocardial infarction: Secondary | ICD-10-CM | POA: Diagnosis present

## 2021-05-03 DIAGNOSIS — E1169 Type 2 diabetes mellitus with other specified complication: Secondary | ICD-10-CM

## 2021-05-03 DIAGNOSIS — L02213 Cutaneous abscess of chest wall: Secondary | ICD-10-CM

## 2021-05-03 DIAGNOSIS — E7800 Pure hypercholesterolemia, unspecified: Secondary | ICD-10-CM | POA: Diagnosis not present

## 2021-05-03 DIAGNOSIS — R079 Chest pain, unspecified: Secondary | ICD-10-CM | POA: Diagnosis not present

## 2021-05-03 DIAGNOSIS — Z720 Tobacco use: Secondary | ICD-10-CM | POA: Diagnosis present

## 2021-05-03 DIAGNOSIS — E119 Type 2 diabetes mellitus without complications: Secondary | ICD-10-CM | POA: Diagnosis not present

## 2021-05-03 DIAGNOSIS — I214 Non-ST elevation (NSTEMI) myocardial infarction: Secondary | ICD-10-CM | POA: Diagnosis not present

## 2021-05-03 DIAGNOSIS — Z5329 Procedure and treatment not carried out because of patient's decision for other reasons: Secondary | ICD-10-CM | POA: Diagnosis present

## 2021-05-03 DIAGNOSIS — R0789 Other chest pain: Secondary | ICD-10-CM | POA: Diagnosis not present

## 2021-05-03 DIAGNOSIS — I10 Essential (primary) hypertension: Secondary | ICD-10-CM | POA: Diagnosis not present

## 2021-05-03 DIAGNOSIS — F17210 Nicotine dependence, cigarettes, uncomplicated: Secondary | ICD-10-CM | POA: Diagnosis not present

## 2021-05-03 DIAGNOSIS — Z6841 Body Mass Index (BMI) 40.0 and over, adult: Secondary | ICD-10-CM | POA: Diagnosis not present

## 2021-05-03 DIAGNOSIS — Z91011 Allergy to milk products: Secondary | ICD-10-CM

## 2021-05-03 DIAGNOSIS — Z91018 Allergy to other foods: Secondary | ICD-10-CM | POA: Diagnosis not present

## 2021-05-03 DIAGNOSIS — Z803 Family history of malignant neoplasm of breast: Secondary | ICD-10-CM | POA: Diagnosis not present

## 2021-05-03 LAB — URINE DRUG SCREEN, QUALITATIVE (ARMC ONLY)
Amphetamines, Ur Screen: NOT DETECTED
Barbiturates, Ur Screen: NOT DETECTED
Benzodiazepine, Ur Scrn: NOT DETECTED
Cannabinoid 50 Ng, Ur ~~LOC~~: NOT DETECTED
Cocaine Metabolite,Ur ~~LOC~~: NOT DETECTED
MDMA (Ecstasy)Ur Screen: NOT DETECTED
Methadone Scn, Ur: NOT DETECTED
Opiate, Ur Screen: POSITIVE — AB
Phencyclidine (PCP) Ur S: NOT DETECTED
Tricyclic, Ur Screen: NOT DETECTED

## 2021-05-03 LAB — HEMOGLOBIN A1C
Hgb A1c MFr Bld: 10.8 % — ABNORMAL HIGH (ref 4.8–5.6)
Mean Plasma Glucose: 263.26 mg/dL

## 2021-05-03 LAB — SEDIMENTATION RATE: Sed Rate: 18 mm/hr (ref 0–20)

## 2021-05-03 LAB — LACTIC ACID, PLASMA: Lactic Acid, Venous: 1.7 mmol/L (ref 0.5–1.9)

## 2021-05-03 LAB — BASIC METABOLIC PANEL
Anion gap: 13 (ref 5–15)
BUN: 10 mg/dL (ref 6–20)
CO2: 25 mmol/L (ref 22–32)
Calcium: 8.9 mg/dL (ref 8.9–10.3)
Chloride: 97 mmol/L — ABNORMAL LOW (ref 98–111)
Creatinine, Ser: 0.53 mg/dL (ref 0.44–1.00)
GFR, Estimated: >60 mL/min (ref >60)
Glucose, Bld: 230 mg/dL — ABNORMAL HIGH (ref 70–99)
Potassium: 3.8 mmol/L (ref 3.5–5.1)
Sodium: 135 mmol/L (ref 135–145)

## 2021-05-03 LAB — APTT: aPTT: 24 seconds (ref 24–36)

## 2021-05-03 LAB — CBC
HCT: 44.3 % (ref 36.0–46.0)
Hemoglobin: 14.3 g/dL (ref 12.0–15.0)
MCH: 26.3 pg (ref 26.0–34.0)
MCHC: 32.3 g/dL (ref 30.0–36.0)
MCV: 81.4 fL (ref 80.0–100.0)
Platelets: 430 10*3/uL — ABNORMAL HIGH (ref 150–400)
RBC: 5.44 MIL/uL — ABNORMAL HIGH (ref 3.87–5.11)
RDW: 14.6 % (ref 11.5–15.5)
WBC: 17.6 10*3/uL — ABNORMAL HIGH (ref 4.0–10.5)
nRBC: 0 % (ref 0.0–0.2)

## 2021-05-03 LAB — C-REACTIVE PROTEIN: CRP: 3 mg/dL — ABNORMAL HIGH (ref <1.0)

## 2021-05-03 LAB — PROTIME-INR
INR: 1 (ref 0.8–1.2)
Prothrombin Time: 13 seconds (ref 11.4–15.2)

## 2021-05-03 LAB — PROCALCITONIN: Procalcitonin: <0.1 ng/mL

## 2021-05-03 LAB — CBG MONITORING, ED: Glucose-Capillary: 197 mg/dL — ABNORMAL HIGH (ref 70–99)

## 2021-05-03 LAB — TROPONIN I (HIGH SENSITIVITY)
Troponin I (High Sensitivity): 314 ng/L (ref <18)
Troponin I (High Sensitivity): 427 ng/L (ref <18)
Troponin I (High Sensitivity): 689 ng/L (ref <18)

## 2021-05-03 MED ORDER — SODIUM CHLORIDE 0.9 % IV BOLUS
1000.0000 mL | Freq: Once | INTRAVENOUS | Status: AC
  Administered 2021-05-03: 1000 mL via INTRAVENOUS

## 2021-05-03 MED ORDER — INSULIN ASPART 100 UNIT/ML IJ SOLN
0.0000 [IU] | Freq: Three times a day (TID) | INTRAMUSCULAR | Status: DC
  Administered 2021-05-03: 2 [IU] via SUBCUTANEOUS
  Filled 2021-05-03: qty 1

## 2021-05-03 MED ORDER — AMLODIPINE BESYLATE 5 MG PO TABS
5.0000 mg | ORAL_TABLET | Freq: Every day | ORAL | Status: DC
  Administered 2021-05-03: 5 mg via ORAL
  Filled 2021-05-03: qty 1

## 2021-05-03 MED ORDER — MORPHINE SULFATE (PF) 2 MG/ML IV SOLN
2.0000 mg | INTRAVENOUS | Status: DC | PRN
  Administered 2021-05-03: 2 mg via INTRAVENOUS
  Filled 2021-05-03: qty 1

## 2021-05-03 MED ORDER — ONDANSETRON HCL 4 MG/2ML IJ SOLN
4.0000 mg | Freq: Once | INTRAMUSCULAR | Status: AC
  Administered 2021-05-03: 4 mg via INTRAVENOUS
  Filled 2021-05-03: qty 2

## 2021-05-03 MED ORDER — ATORVASTATIN CALCIUM 20 MG PO TABS
40.0000 mg | ORAL_TABLET | Freq: Every day | ORAL | Status: DC
  Administered 2021-05-03: 40 mg via ORAL
  Filled 2021-05-03: qty 2

## 2021-05-03 MED ORDER — ASPIRIN 81 MG PO CHEW
324.0000 mg | CHEWABLE_TABLET | Freq: Once | ORAL | Status: AC
  Administered 2021-05-03: 324 mg via ORAL
  Filled 2021-05-03: qty 4

## 2021-05-03 MED ORDER — ACETAMINOPHEN 325 MG PO TABS
650.0000 mg | ORAL_TABLET | Freq: Four times a day (QID) | ORAL | Status: DC | PRN
  Administered 2021-05-03: 650 mg via ORAL
  Filled 2021-05-03: qty 2

## 2021-05-03 MED ORDER — ALBUTEROL SULFATE (2.5 MG/3ML) 0.083% IN NEBU: 3.0000 mL | INHALATION_SOLUTION | RESPIRATORY_TRACT | Status: DC | PRN

## 2021-05-03 MED ORDER — ASPIRIN EC 81 MG PO TBEC: 81.0000 mg | DELAYED_RELEASE_TABLET | Freq: Every day | ORAL | Status: DC

## 2021-05-03 MED ORDER — INSULIN ASPART 100 UNIT/ML IJ SOLN: 0.0000 [IU] | Freq: Every day | INTRAMUSCULAR | Status: DC

## 2021-05-03 MED ORDER — MORPHINE SULFATE (PF) 4 MG/ML IV SOLN
4.0000 mg | Freq: Once | INTRAVENOUS | Status: AC
  Administered 2021-05-03: 4 mg via INTRAVENOUS
  Filled 2021-05-03: qty 1

## 2021-05-03 MED ORDER — NITROGLYCERIN 0.4 MG SL SUBL
0.4000 mg | SUBLINGUAL_TABLET | Freq: Once | SUBLINGUAL | Status: AC
  Administered 2021-05-03: 0.4 mg via SUBLINGUAL
  Filled 2021-05-03: qty 1

## 2021-05-03 MED ORDER — ONDANSETRON HCL 4 MG/2ML IJ SOLN: 4.0000 mg | Freq: Three times a day (TID) | INTRAMUSCULAR | Status: DC | PRN

## 2021-05-03 MED ORDER — HYDRALAZINE HCL 20 MG/ML IJ SOLN
5.0000 mg | INTRAMUSCULAR | Status: DC | PRN
  Administered 2021-05-03: 5 mg via INTRAVENOUS
  Filled 2021-05-03: qty 1

## 2021-05-03 MED ORDER — SODIUM CHLORIDE 0.9 % IV SOLN
2.0000 g | INTRAVENOUS | Status: DC
  Administered 2021-05-03: 2 g via INTRAVENOUS
  Filled 2021-05-03: qty 20

## 2021-05-03 MED ORDER — HEPARIN (PORCINE) 25000 UT/250ML-% IV SOLN
1300.0000 [IU]/h | INTRAVENOUS | Status: DC
  Administered 2021-05-03: 1300 [IU]/h via INTRAVENOUS
  Filled 2021-05-03: qty 250

## 2021-05-03 MED ORDER — IOHEXOL 350 MG/ML SOLN
100.0000 mL | Freq: Once | INTRAVENOUS | Status: AC | PRN
  Administered 2021-05-03: 100 mL via INTRAVENOUS

## 2021-05-03 MED ORDER — ISOSORBIDE MONONITRATE ER 60 MG PO TB24
30.0000 mg | ORAL_TABLET | Freq: Every day | ORAL | Status: DC
  Administered 2021-05-03: 30 mg via ORAL
  Filled 2021-05-03: qty 1

## 2021-05-03 MED ORDER — NICOTINE 21 MG/24HR TD PT24
21.0000 mg | MEDICATED_PATCH | Freq: Every day | TRANSDERMAL | Status: DC
  Filled 2021-05-03: qty 1

## 2021-05-03 MED ORDER — HEPARIN BOLUS VIA INFUSION
4000.0000 [IU] | Freq: Once | INTRAVENOUS | Status: AC
  Administered 2021-05-03: 4000 [IU] via INTRAVENOUS
  Filled 2021-05-03: qty 4000

## 2021-05-03 MED ORDER — ACETAMINOPHEN 325 MG PO TABS
650.0000 mg | ORAL_TABLET | Freq: Once | ORAL | Status: AC
  Administered 2021-05-03: 650 mg via ORAL
  Filled 2021-05-03: qty 2

## 2021-05-03 MED ORDER — SODIUM CHLORIDE 0.9 % IV SOLN: INTRAVENOUS | Status: DC

## 2021-05-03 NOTE — ED Notes (Signed)
Pt ambulatory to RR unassisted at this time. Family at bedside.

## 2021-05-03 NOTE — Consult Note (Signed)
Cardiology Consultation:   Patient ID: Holly Garner MRN: [id number]; DOB: [birth year]  Admit date: 05/03/2021 Date of Consult: 05/03/2021  PCP:  Lorre Munroe, NP   [facility] Providers Cardiologist:  new- [place] rounding    Patient Profile:   Holly Garner is a 40 y.o. female with a hx of hypertension, smoker who is being seen 05/03/2021 for the evaluation of chest pain at the request of Dr Cyril Loosen.  History of Present Illness:   [name] is a 40 year old female with history of hypertension, diabetes, morbid obesity, current smoker x20 years presenting with chest pain.  Patient was asleep when symptoms of chest discomfort began.  Discomfort felt like a gas bubble, associated with some improvement with burping.  Rates severity as 5 out of 10.  Symptoms persisted onto ED presentation.  Was given a sublingual nitroglycerin which resolved discomfort.  She denies any history of heart disease.  Was diagnosed with hypertension years ago, but stopped taking medications.  Denies any family history of heart disease.  In the ED, EKG showed sinus rhythm, diffuse T wave inversions.  Initial troponins were 314, 427.  She was started on heparin drip for possible NSTEMI.  Blood pressures were elevated, systolic in the 180s.  CT chest with no evidence for dissection.   Past Medical History:  Diagnosis Date   BMI 50.0-59.9, adult (HCC)    History of Papanicolaou smear of cervix 04/2011   normal per pt.   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 Authorizing Provider  doxycycline (VIBRAMYCIN) 100 MG capsule Take 1 capsule (100 mg total) by mouth 2 (two) times daily for 14 days. 04/21/21 05/05/21 Yes  Copland, Ilona Sorrel, PA-C  EPINEPHrine (EPIPEN 2-PAK) 0.3 mg/0.3 mL IJ SOAJ injection Inject 0.3 mLs (0.3 mg total) into the muscle once. 01/07/16   Gouru, Deanna Artis, MD  fluticasone (FLONASE) 50 MCG/ACT nasal spray Place 1 spray into both nostrils 2 (two) times daily. 11/30/20 12/30/20  [place], Betty G, MD  PROAIR HFA 108 [phone] Base) MCG/ACT inhaler Inhale 1-2 puffs into the lungs every 6 (six) hours as needed for wheezing or shortness of breath. 12/01/20   Lorre Munroe, NP    Inpatient Medications: Scheduled Meds:  amLODipine  5 mg Oral Daily   insulin aspart  0-5 Units Subcutaneous QHS   insulin aspart  0-9 Units Subcutaneous TID WC   nicotine  21 mg Transdermal Daily   Continuous Infusions:  sodium chloride     cefTRIAXone (ROCEPHIN)  IV Stopped (05/03/21 1631)   heparin 1,300 Units/hr (05/03/21 1700)   PRN Meds: acetaminophen, albuterol, hydrALAZINE, morphine injection, ondansetron (ZOFRAN) IV  Allergies:    Allergies  Allergen Reactions   Malt    Milk-Related Compounds    Other Other (See Comments)   Shellfish Allergy    Soy Allergy     Social History:   Social History   Socioeconomic History   Marital status: Married    Spouse name: Not on file   Number of children: Not on file   Years of education: 12   Highest education level: Not on file  Occupational History   Occupation: unemployed   Occupation: homemaker  Tobacco  Use   Smoking status: Every Day    Packs/day: 1.00    Years: 10.00    Pack years: 10.00    Types: Cigarettes   Smokeless tobacco: Never  Vaping Use   Vaping Use: Never used  Substance and Sexual Activity   Alcohol use: No   Drug use: No   Sexual activity: Yes    Birth control/protection: Surgical    Comment: Vasectomy  Other Topics Concern   Not on file  Social History Narrative   Not on file   Social Determinants of Health   Financial Resource Strain: Not on file  Food Insecurity: Not on file  Transportation Needs: Not on file  Physical  Activity: Not on file  Stress: Not on file  Social Connections: Not on file  Intimate Partner Violence: Not on file    Family History:    Family History  Problem Relation Age of Onset   Breast cancer Maternal Grandmother        late years     ROS:  Please see the history of present illness.   All other ROS reviewed and negative.     Physical Exam/Data:   Vitals:   05/03/21 1500 05/03/21 1600 05/03/21 1615 05/03/21 1630  BP: (!) 188/105 (!) 155/98  (!) 160/109  Pulse: 81 88 98 82  Resp: (!) 25 16 15 17   Temp:      TempSrc:      SpO2: 95% 98% 99% 100%  Weight:      Height:        Intake/Output Summary (Last 24 hours) at 05/03/2021 1746 Last data filed at 05/03/2021 1700 Gross per 24 hour  Intake 53.42 ml  Output --  Net 53.42 ml   Last 3 Weights 05/03/2021 04/21/2021 06/14/2020  Weight (lbs) 304 lb 3.8 oz 306 lb 332 lb  Weight (kg) 138 kg 138.801 kg 150.594 kg     Body mass index is 50.63 kg/m.  General:  Well nourished, well developed, in no acute distress HEENT: normal Lymph: no adenopathy Neck: no JVD Endocrine:  No thryomegaly Vascular: No carotid bruits; FA pulses 2+ bilaterally without bruits  Cardiac:  normal S1, S2; RRR; no murmur  Lungs:  clear to auscultation bilaterally, no wheezing, rhonchi or rales  Abd: soft, nontender, no hepatomegaly  Ext: no edema Musculoskeletal:  No deformities, BUE and BLE strength normal and equal Skin: warm and dry  Neuro:  CNs 2-12 intact, no focal abnormalities noted Psych:  Normal affect   EKG:  The EKG was personally reviewed and demonstrates: Sinus rhythm, diffuse T wave inversions. Telemetry:  Telemetry was personally reviewed and demonstrates: Sinus rhythm  Relevant CV Studies: Echo ordered  Laboratory Data:  High Sensitivity Troponin:   Recent Labs  Lab 05/03/21 1223 05/03/21 1523  TROPONINIHS 314* 427*     Chemistry Recent Labs  Lab 05/03/21 1223  NA 135  K 3.8  CL 97*  CO2 25  GLUCOSE 230*   BUN 10  CREATININE 0.53  CALCIUM 8.9  GFRNONAA >60  ANIONGAP 13    No results for input(s): PROT, ALBUMIN, AST, ALT, ALKPHOS, BILITOT in the last 168 hours. Hematology Recent Labs  Lab 05/03/21 1223  WBC 17.6*  RBC 5.44*  HGB 14.3  HCT 44.3  MCV 81.4  MCH 26.3  MCHC 32.3  RDW 14.6  PLT 430*   BNPNo results for input(s): BNP, PROBNP in the last 168 hours.  DDimer No results for input(s): DDIMER in the last 168  hours.   Radiology/Studies:  DG Chest 2 View  Result Date: 05/03/2021 CLINICAL DATA:  Chest pain EXAM: CHEST - 2 VIEW COMPARISON:  10/18/2017 FINDINGS: The heart size and mediastinal contours are within normal limits. Mildly prominent interstitial markings bilaterally, most pronounced within the right lung base. No pleural effusion or pneumothorax. The visualized skeletal structures are unremarkable. IMPRESSION: Mildly prominent interstitial markings bilaterally, most pronounced within the right lung base. Findings may reflect bronchitic type lung changes versus developing atypical/viral infection. Electronically Signed   By: Duanne Guess D.O.   On: 05/03/2021 13:14   CT ANGIO CHEST AORTA W/CM & OR WO/CM  Result Date: 05/03/2021 CLINICAL DATA:  Chest pain in the center hyper chest radiating to the back. EXAM: CT ANGIOGRAPHY CHEST WITH CONTRAST TECHNIQUE: Multidetector CT imaging of the chest was performed using the standard protocol during bolus administration of intravenous contrast. Multiplanar CT image reconstructions and MIPs were obtained to evaluate the vascular anatomy. CONTRAST:  OMNIPAQUE IOHEXOL 350 MG/ML SOLN COMPARISON:  Two-view chest x-ray 05/03/2021. CT chest with contrast 01/06/2016 FINDINGS: Cardiovascular: Heart size is normal. Arch, great vessels, and descending thoracic aorta is normal. No dissection. Pulmonary arteries of normal size. Although the study was not optimized for detection of pulmonary emboli, no central filling defects evident.  Mediastinum/Nodes: No enlarged mediastinal, hilar, or axillary lymph nodes. Thyroid gland, trachea, and esophagus demonstrate no significant findings. Lungs/Pleura: Lungs are clear. No nodule, mass, or airspace disease is present. No significant pleural disease is present. Upper Abdomen: Aorta is within normal limits to the level of the renal arteries. Upper abdomen is unremarkable. Musculoskeletal: Sclerotic endplate changes are present within the thoracic spine at multiple levels. These are stable, associated with Schmorl's nodes. Vertebral body heights and alignment are normal. Review of the MIP images confirms the above findings. IMPRESSION: 1. No acute or focal lesion to explain the patient's symptoms. 2. 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 Electronically Signed   By: Marin Roberts M.D.   On: 05/03/2021 14:54     Assessment and Plan:   Chest pain, NSTEMI -Continue to trend troponins until peaked currently 314, 427 -Risk factors hypertension, current tobacco use -Symptoms resolved with nitro sublingual -Continue heparin drip -Start Imdur 30 mg daily, aspirin 81, Lipitor 40 mg -Obtain echocardiogram -Keep n.p.o. after midnight.  May consider left heart cath either tomorrow or Friday pending troponin results, scheduling and patient's symptoms.  2.  Hypertension -Amlodipine started, add Imdur  3.  Hyperlipidemia -Start Lipitor 40 mg daily  4.  Current smoker -Cessation advised  Total encounter time more than 110 minutes  Greater than 50% was spent in counseling and coordination of care with the patient.   Signed, Debbe Odea, MD  05/03/2021 5:46 PM

## 2021-05-03 NOTE — Telephone Encounter (Signed)
Unable to reach pt or pts husband on phone. Left v/m requesting cb. Last saw R Baity NP 09/25/19. Pt has appt 06/16/21 with Allayne Gitelman NP for CPX. Sending note to Allayne Gitelman NP, Joellen CMA and myself. Per access nurse note pt did agree to go to ED for eval.

## 2021-05-03 NOTE — H&P (Signed)
History and Physical    Holly Garner ERX:[id number] DOB: [birth year] DOA: 05/03/2021  Referring MD/NP/PA:   PCP: Jearld Fenton, NP   Patient coming from:  The patient is coming from home.  At baseline, pt is independent for most of ADL.        Chief Complaint: chest pain  HPI: Holly Garner is a 40 y.o. female with medical history significant of hypertension, diabetes mellitus, tobacco abuse, morbid obesity with BMI 50.63, who presents with chest pain.  Patient states that her chest pain started last night, which is located in substernal area, intermittent,gripping like pain, initially 8 out of 10 in severity, currently 5 out of 10 severity, radiating to the upper back.  Patient has mild shortness breath, no cough, fever or chills.  Denies nausea vomiting, diarrhea or abdominal pain.  No symptoms of UTI.  Of note, patient has right chest wall infection under her right breast area. She was seen by Ob.Gyn on 04/21/21, diagnosed as chest wall abscess and started on doxycycline.  Patient states that her infection is slightly better,  but still has significant pain.  ED Course: pt was found to have troponin level 314, WBC 17.6, pending COVID-19 PCR, pending pregnancy test, electrolytes renal function okay, temperature normal, blood pressure 138/111, 178/96, heart rate 101, RR 29, oxygen saturation 95% on room air.  Chest x-ray showed bronchitic change versus atypical infection.  CT angiograms of chest/abdomen/pelvis negative for dissection.  Patient is admitted to progressive bed as inpatient.  Dr. Garen Lah of card is consulted.  Review of Systems:   General: no fevers, chills, no body weight gain, has fatigue HEENT: no blurry vision, hearing changes or sore throat Respiratory: has mild dyspnea, no coughing, wheezing CV: has chest pain, no palpitations GI: no nausea, vomiting, abdominal pain, diarrhea, constipation GU: no dysuria, burning on urination, increased urinary frequency,  hematuria  Ext: no leg edema Neuro: no unilateral weakness, numbness, or tingling, no vision change or hearing loss Skin: no rash, no skin tear. Has pain and redness in right chest wall under right breast area MSK: No muscle spasm, no deformity, no limitation of range of movement in spin Heme: No easy bruising.  Travel history: No recent long distant travel.  Allergy:  Allergies  Allergen Reactions   Malt    Milk-Related Compounds    Other Other (See Comments)   Shellfish Allergy    Soy Allergy     Past Medical History:  Diagnosis Date   BMI 50.0-59.9, adult ([place])    History of Papanicolaou smear of cervix 04/2011   normal per pt.   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 She has a 10.00 pack-year smoking history. She has never used smokeless tobacco. She reports that she does not drink alcohol and does not use drugs.  Family History:  Family History  Problem Relation Age of Onset   Breast cancer Maternal Grandmother        late years     Prior to Admission medications   Medication Sig Start Date End Date Taking? Authorizing Provider  doxycycline (VIBRAMYCIN) 100 MG capsule Take 1 capsule (100 mg total) by mouth 2 (two) times daily for 14 days. 04/21/21 05/05/21 Yes Copland,  Deirdre Evener, PA-C  EPINEPHrine (EPIPEN 2-PAK) 0.3 mg/0.3 mL IJ SOAJ injection Inject 0.3 mLs (0.3 mg total) into the muscle once. 01/07/16   Gouru, Illene Silver, MD  fluticasone (FLONASE) 50 MCG/ACT nasal spray Place 1 spray into both nostrils 2 (two) times daily. 11/30/20 12/30/20  [place], Betty G, MD  PROAIR HFA 108 [phone] Base) MCG/ACT inhaler Inhale 1-2 puffs into the lungs every 6 (six) hours as needed for wheezing or shortness of breath. 12/01/20   Jearld Fenton, NP     Physical Exam: Vitals:   05/03/21 1630 05/03/21 1700 05/03/21 1715 05/03/21 1803  BP: (!) 160/109 (!) 161/92  (!) 157/76  Pulse: 82 84 90 91  Resp: 17 (!) _0 Temp:      TempSrc:      SpO2: 100% 100% 100% 99%  Weight:      Height:       General: Not in acute distress HEENT:       Eyes: PERRL, EOMI, no scleral icterus.       ENT: No discharge from the ears and nose, no pharynx injection, no tonsillar enlargement.        Neck: No JVD, no bruit, no mass felt. Heme: No neck lymph node enlargement. Cardiac: S1/S2, RRR, No murmurs, No gallops or rubs. Respiratory: No rales, wheezing, rhonchi or rubs. GI: Soft, nondistended, nontender, no rebound pain, no organomegaly, BS present. GU: No hematuria Ext: No pitting leg edema bilaterally. 1+DP/PT pulse bilaterally. Musculoskeletal: No joint deformities, No joint redness or warmth, no limitation of ROM in spin. Skin: No rashes. Has small area of tenderness and redness in right chest wall under right breast area, no fluctuation. Neuro: Alert, oriented X3, cranial nerves II-XII grossly intact, moves all extremities normally. Psych: Patient is not psychotic, no suicidal or hemocidal ideation.  Labs on Admission: I have personally reviewed following labs and imaging studies  CBC: Recent Labs  Lab 05/03/21 1223  WBC 17.6*  HGB 14.3  HCT 44.3  MCV 81.4  PLT 295*   Basic Metabolic Panel: Recent Labs  Lab 05/03/21 1223  NA 135  K 3.8  CL 97*  CO2 25  GLUCOSE 230*  BUN 10  CREATININE 0.53  CALCIUM 8.9   GFR: Estimated Creatinine Clearance: 135.9 mL/min (by C-G formula based on SCr of 0.53 mg/dL). Liver Function Tests: No results for input(s): AST, ALT, ALKPHOS, BILITOT, PROT, ALBUMIN in the last 168 hours. No results for input(s): LIPASE, AMYLASE in the last 168 hours. No results for input(s): AMMONIA in the last 168 hours. Coagulation Profile: Recent Labs  Lab 05/03/21 1523  INR 1.0   Cardiac Enzymes: No  results for input(s): CKTOTAL, CKMB, CKMBINDEX, TROPONINI in the last 168 hours. BNP (last 3 results) No results for input(s): PROBNP in the last 8760 hours. HbA1C: No results for input(s): HGBA1C in the last 72 hours. CBG: Recent Labs  Lab 05/03/21 1656  GLUCAP 197*   Lipid Profile: No results for input(s): CHOL, HDL, LDLCALC, TRIG, CHOLHDL, LDLDIRECT in the last 72 hours. Thyroid Function Tests: No results for input(s): TSH, T4TOTAL, FREET4, T3FREE, THYROIDAB in the last 72 hours. Anemia Panel: No results for input(s): VITAMINB12, FOLATE, FERRITIN, TIBC, IRON, RETICCTPCT in the last 72 hours. Urine analysis:    Component Value Date/Time   COLORURINE YELLOW (A) 10/18/2017 1828   APPEARANCEUR CLEAR (A) 10/18/2017 1828   LABSPEC 1.020 10/18/2017 1828   PHURINE 6.0 10/18/2017 1828   GLUCOSEU NEGATIVE 10/18/2017 1828   HGBUR  SMALL (A) 10/18/2017 1828   BILIRUBINUR NEGATIVE 10/18/2017 1828   KETONESUR NEGATIVE 10/18/2017 1828   PROTEINUR 30 (A) 10/18/2017 1828   UROBILINOGEN 0.2 06/13/2011 0400   NITRITE NEGATIVE 10/18/2017 1828   LEUKOCYTESUR NEGATIVE 10/18/2017 1828   Sepsis Labs: _0 (procalcitonin:4,lacticidven:4) )No results found for this or any previous visit (from the past 240 hour(s)).   Radiological Exams on Admission: DG Chest 2 View  Result Date: 05/03/2021 CLINICAL DATA:  Chest pain EXAM: CHEST - 2 VIEW COMPARISON:  10/18/2017 FINDINGS: The heart size and mediastinal contours are within normal limits. Mildly prominent interstitial markings bilaterally, most pronounced within the right lung base. No pleural effusion or pneumothorax. The visualized skeletal structures are unremarkable. IMPRESSION: Mildly prominent interstitial markings bilaterally, most pronounced within the right lung base. Findings may reflect bronchitic type lung changes versus developing atypical/viral infection. Electronically Signed   By: Davina Poke D.O.   On: 05/03/2021 13:14   CT  ANGIO CHEST AORTA W/CM & OR WO/CM  Result Date: 05/03/2021 CLINICAL DATA:  Chest pain in the center hyper chest radiating to the back. EXAM: CT ANGIOGRAPHY CHEST WITH CONTRAST TECHNIQUE: Multidetector CT imaging of the chest was performed using the standard protocol during bolus administration of intravenous contrast. Multiplanar CT image reconstructions and MIPs were obtained to evaluate the vascular anatomy. CONTRAST:  156m OMNIPAQUE IOHEXOL 350 MG/ML SOLN COMPARISON:  Two-view chest x-ray 05/03/2021. CT chest with contrast 01/06/2016 FINDINGS: Cardiovascular: Heart size is normal. Arch, great vessels, and descending thoracic aorta is normal. No dissection. Pulmonary arteries of normal size. Although the study was not optimized for detection of pulmonary emboli, no central filling defects evident. Mediastinum/Nodes: No enlarged mediastinal, hilar, or axillary lymph nodes. Thyroid gland, trachea, and esophagus demonstrate no significant findings. Lungs/Pleura: Lungs are clear. No nodule, mass, or airspace disease is present. No significant pleural disease is present. Upper Abdomen: Aorta is within normal limits to the level of the renal arteries. Upper abdomen is unremarkable. Musculoskeletal: Sclerotic endplate changes are present within the thoracic spine at multiple levels. These are stable, associated with Schmorl's nodes. Vertebral body heights and alignment are normal. Review of the MIP images confirms the above findings. IMPRESSION: 1. No acute or focal lesion to explain the patient's symptoms. 2. 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 Electronically Signed   By: CSan MorelleM.D.   On: 05/03/2021 14:54     EKG: I have personally reviewed.  Sinus rhythm, QTC 432, LAE, T wave inversion in inferior leads and precordial leads.  Assessment/Plan Principal Problem:   NSTEMI (non-ST elevated myocardial infarction) ([facility] Active Problems:   Morbid [place] ([place]   HTN  (hypertension)   Diabetes mellitus without complication (H[facility])   Cellulitis of chest wall   [place]   Tobacco abuse   NSTEMI (non-ST elevated myocardial infarction) ([facility]: trop 314 --> 427, CTA negative for dissection.  Dr. AGaren Lahof cardiology is consulted. - admit to progressive unit as inpatient - IV heparin - Trend Trop - Repeat EKG in the am  - prn Nitroglycerin, Morphine, and aspirin - lipitor 40 mg daily - started Imdur per card - Risk factor stratification: will check FLP and A1C  - check UDS  Morbid obesity ([place]: BMI 50.63 -Diet and exercise.   -Encouraged to lose weight.   HTN (hypertension) -start amlodipine 5 mg daily -IV hydralazine as needed  Diabetes mellitus without complication ([facility]: Recent A1c 8.5, poorly controlled.  Patient's not taking medications now -Sliding scale insulin  Sepsis  due to cellulitis of chest wall: Patient admits criteria for sepsis with leukocytosis with WBC 17.6, tachycardia with heart rate of 101, tachypnea with RR 29.  Lactic acid of 1.7. -Switch doxycycline to IV Rocephin -Check ESR, CRP -f/u Bx -will get Procalcitonin  -IVF: 1L of NS bolus in ED, followed by 75cc/h   Tobacco abuse -Nicotine patch     DVT ppx: on IV heparin Code Status: Full code Family Communication: Yes, patient's  husband  at bed side Disposition Plan:  Anticipate discharge back to previous environment Consults called:  Dr. Garen Lah Admission status and Level of care: Progressive Cardiac:    as inpt       Status is: Inpatient  Remains inpatient appropriate because:Inpatient level of care appropriate due to severity of illness  Dispo: The patient is from: Home              Anticipated d/c is to: Home              Patient currently not medically stable for discharge   Difficult to place patient No          Date of Service 05/03/2021    [facility]   If 7PM-7AM, please contact  night-coverage [email or web address] 05/03/2021, 6:59 PM

## 2021-05-03 NOTE — ED Notes (Signed)
Pt did not want second blood culture set. Upset and tearful at being stuck again but understands need for heparin. Antibiotics started without second set.

## 2021-05-03 NOTE — ED Notes (Signed)
Pt noted to be ambulatory down the hallway accompanied with family member via ER staff. No acute distress noted.

## 2021-05-03 NOTE — ED Notes (Addendum)
Admitting Dr. Arville Care at bedside.

## 2021-05-03 NOTE — Progress Notes (Signed)
The patient wanted to leave AMA.  I discussed with her that given her latest troponin I she definitely has a heart attack.  She initially told me that her cardiologist did not think it was a heart attack.  Reviewing our cardiologist notes he definitely admitted to non-ST elevation myocardial infarction and was planning to keep the patient n.p.o. for potential left heart catheterization in a.m.  I explained that to her.  She and her husband stated they would like to talk to the cardiologist.  I paged Dr. Azucena Cecil with her number and sent him a secure chat.  Unfortunately the patient and her husband did not wait until the evening talk to the cardiologist and left the ER after signing AMA.  She and her husband understands that she can die from this MI anytime and insisted on leaving AMA.Marland Kitchen

## 2021-05-03 NOTE — Consult Note (Signed)
ANTICOAGULATION CONSULT NOTE - Initial Consult  Pharmacy Consult for heparin infusion Indication: chest pain/ACS  Allergies  Allergen Reactions   Malt    Milk-Related Compounds    Other Other (See Comments)   Shellfish Allergy    Soy Allergy     Patient Measurements: Height: 5\' 5"  (165.1 cm) Weight: (!) 138 kg (304 lb 3.8 oz) IBW/kg (Calculated) : 57 Heparin Dosing Weight: 91.3 kg   Vital Signs: Temp: 98.4 F (36.9 C) (07/13 1226) Temp Source: Oral (07/13 1226) BP: 188/105 (07/13 1500) Pulse Rate: 81 (07/13 1500)  Labs: Recent Labs    05/03/21 1223  HGB 14.3  HCT 44.3  PLT 430*  CREATININE 0.53  TROPONINIHS 314*    Estimated Creatinine Clearance: 135.9 mL/min (by C-G formula based on SCr of 0.53 mg/dL).   Medical History: Past Medical History:  Diagnosis Date   BMI 50.0-59.9, adult (HCC)    History of Papanicolaou smear of cervix 04/2011   normal per pt.   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 female who presents with complaints of chest pain. Troponin 314. Pharmacy has been consulted for initiation and management of heparin infusion Goal of Therapy:  Heparin level 0.3-0.7 units/ml Monitor platelets by anticoagulation protocol: Yes   Plan:  Give 4000 units bolus x 1 Start heparin infusion at 1300 units/hr Check anti-Xa level in 6 hours and daily while on heparin Continue to monitor H&H and platelets  30, PharmD, BCPS Clinical Pharmacist   05/03/2021,3:21 PM

## 2021-05-03 NOTE — ED Notes (Signed)
Pt CP relieved by x1 sublingual nitro. BP better at 139/69.

## 2021-05-03 NOTE — ED Provider Notes (Signed)
[facility] Emergency Department Provider Note   ____________________________________________    I have reviewed the triage vital signs and the nursing notes.   HISTORY  Chief Complaint Chest Pain     HPI Holly Garner is a 40 y.o. female who presents with complaints of chest pain.  Patient reports chest pain started last night and has waxed and waned.  She reports she has pain in between her shoulder blades as well.  She describes the chest pain as gripping.  No history of heart disease.  She does smoke cigarettes.  She reports has been diagnosed with high blood pressure but does not take anything for it.   Past Medical History:  Diagnosis Date   BMI 50.0-59.9, adult (HCC)    History of Papanicolaou smear of cervix 04/2011   normal per pt.   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 Authorizing Provider  doxycycline (VIBRAMYCIN) 100 MG capsule Take 1 capsule (100 mg total) by mouth 2 (two) times daily for 14 days. 04/21/21 05/05/21 Yes Copland, Ilona Sorrel, PA-C  EPINEPHrine (EPIPEN 2-PAK) 0.3 mg/0.3 mL IJ SOAJ injection Inject 0.3 mLs (0.3 mg total) into the muscle once. 01/07/16   Gouru, Deanna Artis, MD  fluticasone (FLONASE) 50 MCG/ACT nasal spray Place 1 spray into both nostrils 2 (two) times daily. 11/30/20 12/30/20  [place], Betty G, MD  PROAIR HFA 108 [phone] Base) MCG/ACT inhaler Inhale 1-2 puffs into the lungs every 6 (six) hours as needed for wheezing or shortness of breath.  12/01/20   Lorre Munroe, NP     Allergies Malt, Milk-related compounds, Other, Shellfish allergy, and Soy allergy  Family History  Problem Relation Age of Onset   Breast cancer Maternal Grandmother        late years    Social History Social History   Tobacco Use   Smoking status: Every Day    Packs/day: 1.00    Years: 10.00    Pack years: 10.00    Types: Cigarettes   Smokeless tobacco: Never  Vaping Use   Vaping Use: Never used  Substance Use Topics   Alcohol use: No   Drug use: No    Review of Systems  Constitutional: No fever/chills Eyes: No visual changes.  ENT: No sore throat. Cardiovascular: As above Respiratory: Denies shortness of breath. Gastrointestinal: No abdominal pain.  No nausea, no vomiting.   Genitourinary: Negative for dysuria. Musculoskeletal: Negative for back pain. Skin: Negative for rash. Neurological: Negative for headaches    ____________________________________________   PHYSICAL EXAM:  VITAL SIGNS: ED Triage Vitals  Enc Vitals Group     BP 05/03/21 1226 (!) 138/111     Pulse Rate 05/03/21 1226 84     Resp 05/03/21 1226 (!) 22     Temp 05/03/21 1226 98.4 F (36.9 C)     Temp Source 05/03/21 1226 Oral     SpO2 05/03/21 1226 98 %     Weight 05/03/21 1222 (!)  138 kg (304 lb 3.8 oz)     Height 05/03/21 1222 1.651 m (5\' 5" )     Head Circumference --      Peak Flow --      Pain Score 05/03/21 1222 8     Pain Loc --      Pain Edu? --      Excl. in GC? --     Constitutional: Alert and oriented.   Nose: No congestion/rhinnorhea. Mouth/Throat: Mucous membranes are moist.   Neck:  Painless ROM Cardiovascular: Normal rate, regular rhythm. Grossly normal heart sounds.  Good peripheral circulation. Respiratory: Normal respiratory effort.  No retractions. Lungs CTAB. Gastrointestinal: Soft and nontender. No distention.  No CVA tenderness.  Musculoskeletal: No lower extremity tenderness nor edema.  Warm and well  perfused Neurologic:  Normal speech and language. No gross focal neurologic deficits are appreciated.  Skin:  Skin is warm, dry and intact. No rash noted. Psychiatric: Mood and affect are normal. Speech and behavior are normal.  ____________________________________________   LABS (all labs ordered are listed, but only abnormal results are displayed)  Labs Reviewed  BASIC METABOLIC PANEL - Abnormal; Notable for the following components:      Result Value   Chloride 97 (*)    Glucose, Bld 230 (*)    All other components within normal limits  CBC - Abnormal; Notable for the following components:   WBC 17.6 (*)    RBC 5.44 (*)    Platelets 430 (*)    All other components within normal limits  TROPONIN I (HIGH SENSITIVITY) - Abnormal; Notable for the following components:   Troponin I (High Sensitivity) 314 (*)    All other components within normal limits  SARS CORONAVIRUS 2 (TAT 6-24 HRS)  POC URINE PREG, ED   ____________________________________________  EKG  ED ECG REPORT I, 05/05/21, the attending physician, personally viewed and interpreted this ECG.  Date: 05/03/2021  Rhythm: normal sinus rhythm QRS Axis: normal Intervals: normal ST/T Wave abnormalities: ST depressions V2 V3, subtle Narrative Interpretation: no evidence of acute ischemia  ____________________________________________  RADIOLOGY  X-ray reviewed by me, no acute abnormality ____________________________________________   PROCEDURES  Procedure(s) performed: No  Procedures   Critical Care performed: yes  CRITICAL CARE Performed by: 05/05/2021   Total critical care time: 30 minutes  Critical care time was exclusive of separately billable procedures and treating other patients.  Critical care was necessary to treat or prevent imminent or life-threatening deterioration.  Critical care was time spent personally by me on the following activities: development of treatment plan with  patient and/or surrogate as well as nursing, discussions with consultants, evaluation of patient's response to treatment, examination of patient, obtaining history from patient or surrogate, ordering and performing treatments and interventions, ordering and review of laboratory studies, ordering and review of radiographic studies, pulse oximetry and re-evaluation of patient's condition. ____________________________________________   INITIAL IMPRESSION / ASSESSMENT AND PLAN / ED COURSE  Pertinent labs & imaging results that were available during my care of the patient were reviewed by me and considered in my medical decision making (see chart for details).   Patient presents with chest pain as detailed above.  EKG somewhat concerning with some subtle ST depressions V2 V3.  We will treat with aspirin, nitroglycerin, morphine while we await labs including high sensitive troponin  Notified of elevated troponin of 314, elevated white blood cell count noted as well.  Patient is describing significant pain between her shoulder blades, will send  for dissection study  CT scan reviewed by me, no clear evidence of dissection.  Will discuss with cardiology and order heparin,  Will consult hospitalist for admission    ____________________________________________   FINAL CLINICAL IMPRESSION(S) / ED DIAGNOSES  Final diagnoses:  NSTEMI (non-ST elevated myocardial infarction) [facility])        Note:  This document was prepared using Dragon voice recognition software and may include unintentional dictation errors.    Jene Every, MD 05/03/21 [phone]

## 2021-05-03 NOTE — ED Notes (Signed)
Upon patient wanting needing to use the Restroom. Pt stated, " I need to go home". This RN notified pt she is admitted to the ER and will have to sign out AMA. Pt states, " I just need to go home". Admitting being paged at this time.

## 2021-05-03 NOTE — ED Notes (Signed)
Per admitting Mansy, Pt is currently awaiting Cardiologist phone call at this time.

## 2021-05-03 NOTE — Telephone Encounter (Signed)
[facility] Day - Client TELEPHONE ADVICE RECORD [facility] Patient Name: Holly Garner Gender: Female DOB: Mar 30, 1981 Age: 40 Y 8 M 12 D Return Phone Number: [phone] (Primary) Address: City/ State/ Zip: [place] [address] Client [facility] Day - Client Client Site [facility] - Day Physician [name]- NP Contact Type Call Who Is Calling Patient / Member / Family / Caregiver Call Type Triage / Clinical Relationship To Patient Self Return Phone Number [phone] (Primary) Chief Complaint CHEST PAIN - pain, pressure, heaviness or tightness Reason for Call Symptomatic / Request for Health Information Initial Comment Has tightness and pain in the chest, started last night, comes/goes. Office has no appts today GOTO Facility Not Listed [facility] ED Translation No Nurse Assessment Nurse: Elesa Hacker, RN, Nash Dimmer Date/Time (Eastern Time): 05/03/2021 10:33:18 AM Confirm and document reason for call. If symptomatic, describe symptoms. ---Has tightness and pain in the chest, started last night, comes/goes. Office has no appts today. No temp. Does the patient have any new or worsening symptoms? ---Yes Will a triage be completed? ---Yes Related visit to physician within the last 2 weeks? ---No Does the PT have any chronic conditions? (i.e. diabetes, asthma, this includes High risk factors for pregnancy, etc.) ---No Is the patient pregnant or possibly pregnant? (Ask all females between the ages of 1-55) ---No Is this a behavioral health or substance abuse call? ---No Guidelines Guideline Title Affirmed Question Affirmed Notes Nurse Date/Time (Eastern Time) Chest Pain [1] Chest pain (or "angina") comes and goes AND [2] is happening more often (increasing in frequency) or getting worse (increasing in severity) (Exception: Deaton, RN, Nash Dimmer 05/03/2021 10:34:17 AM PLEASE NOTE: All  timestamps contained within this report are represented as [place] Standard Time. CONFIDENTIALTY NOTICE: This fax transmission is intended only for the addressee. It contains information that is legally privileged, confidential or otherwise protected from use or disclosure. If you are not the intended recipient, you are strictly prohibited from reviewing, disclosing, copying using or disseminating any of this information or taking any action in reliance on or regarding this information. If you have received this fax in error, please notify us immediately by telephone so that we can arrange for its return to [place]. Phone: [phone], Toll-Free: [phone], Fax: [phone] Page: 2 of 2 Call Id: [id number] Guidelines Guideline Title Affirmed Question Affirmed Notes Nurse Date/Time Lamount Cohen Time) chest pains that last only a few seconds) Disp. Time Lamount Cohen Time) Disposition Final User 05/03/2021 10:32:13 AM Send to Urgent Queue Salvatore Marvel 05/03/2021 10:37:02 AM Go to ED Now Yes Deaton, RN, Cory Roughen Disagree/Comply Comply Caller Understands Yes PreDisposition Did not know what to do Care Advice Given Per Guideline GO TO ED NOW: * You need to be seen in the Emergency Department. * Go to the ED at ___________ Hospital. * Leave now. Drive carefully. * It is better and safer if another adult drives instead of you. ANOTHER ADULT SHOULD DRIVE: CARE ADVICE given per Chest Pain (Adult) guideline. * You become worse * Passes out or becomes too weak to stand * Severe difficulty breathing occurs CALL EMS IF: NOTHING BY MOUTH: * Do not eat or drink anything for now. Referrals GO TO FACILITY OTHER - SPECIFY

## 2021-05-03 NOTE — ED Notes (Signed)
Admitting notified at this time.

## 2021-05-03 NOTE — ED Notes (Signed)
Pt brought food by husband. Pt eating in NAD. Husband remains at bedside.

## 2021-05-03 NOTE — Telephone Encounter (Signed)
Patient has checked into [facility].

## 2021-05-03 NOTE — ED Triage Notes (Signed)
Pt comes into the ED via POV c/o central chest pain that radiates straight to the center of her back.  Pt states it feels tight and difficult to take a deep breath.  Pt denies any dizziness or nausea.  Pt denies any cardiac history.  Pt presents with some labored breathing, but able to saturate well at this time.  Pt denies any recent COVID diagnoses or chest congestion/cold like symptoms.

## 2021-05-03 NOTE — ED Notes (Signed)
Report received from Sarah, RN.

## 2021-05-04 ENCOUNTER — Encounter
Admission: EM | Disposition: A | Discharge status: Home / Self Care | Payer: Self-pay | Source: Home / Self Care | Attending: Internal Medicine

## 2021-05-04 ENCOUNTER — Encounter: Payer: Self-pay | Admitting: *Deleted

## 2021-05-04 ENCOUNTER — Other Ambulatory Visit: Payer: Self-pay

## 2021-05-04 ENCOUNTER — Inpatient Hospital Stay (HOSPITAL_COMMUNITY)
Admit: 2021-05-04 | Discharge: 2021-05-04 | Disposition: A | Discharge status: Home / Self Care | Payer: Medicaid Other | Attending: Physician Assistant | Admitting: Physician Assistant

## 2021-05-04 ENCOUNTER — Inpatient Hospital Stay
Admission: EM | Admit: 2021-05-04 | Discharge: 2021-05-05 | DRG: 247 | Disposition: A | Discharge status: Home / Self Care | Payer: Medicaid Other | Attending: Internal Medicine | Admitting: Internal Medicine

## 2021-05-04 ENCOUNTER — Emergency Department: Payer: Medicaid Other

## 2021-05-04 DIAGNOSIS — Z560 Unemployment, unspecified: Secondary | ICD-10-CM

## 2021-05-04 DIAGNOSIS — Z8249 Family history of ischemic heart disease and other diseases of the circulatory system: Secondary | ICD-10-CM

## 2021-05-04 DIAGNOSIS — T508X5A Adverse effect of diagnostic agents, initial encounter: Secondary | ICD-10-CM | POA: Diagnosis present

## 2021-05-04 DIAGNOSIS — I214 Non-ST elevation (NSTEMI) myocardial infarction: Principal | ICD-10-CM | POA: Diagnosis present

## 2021-05-04 DIAGNOSIS — Z6841 Body Mass Index (BMI) 40.0 and over, adult: Secondary | ICD-10-CM

## 2021-05-04 DIAGNOSIS — I252 Old myocardial infarction: Secondary | ICD-10-CM | POA: Diagnosis present

## 2021-05-04 DIAGNOSIS — Z803 Family history of malignant neoplasm of breast: Secondary | ICD-10-CM

## 2021-05-04 DIAGNOSIS — D75839 Thrombocytosis, unspecified: Secondary | ICD-10-CM | POA: Diagnosis present

## 2021-05-04 DIAGNOSIS — I25110 Atherosclerotic heart disease of native coronary artery with unstable angina pectoris: Secondary | ICD-10-CM | POA: Diagnosis present

## 2021-05-04 DIAGNOSIS — F17210 Nicotine dependence, cigarettes, uncomplicated: Secondary | ICD-10-CM | POA: Diagnosis present

## 2021-05-04 DIAGNOSIS — Z20822 Contact with and (suspected) exposure to covid-19: Secondary | ICD-10-CM | POA: Diagnosis present

## 2021-05-04 DIAGNOSIS — Z91013 Allergy to seafood: Secondary | ICD-10-CM

## 2021-05-04 DIAGNOSIS — E871 Hypo-osmolality and hyponatremia: Secondary | ICD-10-CM | POA: Diagnosis present

## 2021-05-04 DIAGNOSIS — R079 Chest pain, unspecified: Secondary | ICD-10-CM | POA: Diagnosis not present

## 2021-05-04 DIAGNOSIS — L03313 Cellulitis of chest wall: Secondary | ICD-10-CM | POA: Diagnosis not present

## 2021-05-04 DIAGNOSIS — R0682 Tachypnea, not elsewhere classified: Secondary | ICD-10-CM | POA: Diagnosis present

## 2021-05-04 DIAGNOSIS — E785 Hyperlipidemia, unspecified: Secondary | ICD-10-CM | POA: Diagnosis present

## 2021-05-04 DIAGNOSIS — Z79899 Other long term (current) drug therapy: Secondary | ICD-10-CM

## 2021-05-04 DIAGNOSIS — Z9109 Other allergy status, other than to drugs and biological substances: Secondary | ICD-10-CM | POA: Diagnosis not present

## 2021-05-04 DIAGNOSIS — E782 Mixed hyperlipidemia: Secondary | ICD-10-CM

## 2021-05-04 DIAGNOSIS — I10 Essential (primary) hypertension: Secondary | ICD-10-CM | POA: Diagnosis not present

## 2021-05-04 DIAGNOSIS — L02213 Cutaneous abscess of chest wall: Secondary | ICD-10-CM | POA: Diagnosis present

## 2021-05-04 DIAGNOSIS — I959 Hypotension, unspecified: Secondary | ICD-10-CM | POA: Diagnosis present

## 2021-05-04 DIAGNOSIS — R0789 Other chest pain: Secondary | ICD-10-CM | POA: Diagnosis not present

## 2021-05-04 DIAGNOSIS — I2510 Atherosclerotic heart disease of native coronary artery without angina pectoris: Secondary | ICD-10-CM | POA: Diagnosis not present

## 2021-05-04 DIAGNOSIS — Z91011 Allergy to milk products: Secondary | ICD-10-CM

## 2021-05-04 DIAGNOSIS — E1165 Type 2 diabetes mellitus with hyperglycemia: Secondary | ICD-10-CM | POA: Diagnosis not present

## 2021-05-04 HISTORY — PX: CORONARY STENT INTERVENTION: CATH118234

## 2021-05-04 HISTORY — PX: LEFT HEART CATH AND CORONARY ANGIOGRAPHY: CATH118249

## 2021-05-04 LAB — BASIC METABOLIC PANEL
Anion gap: 8 (ref 5–15)
Anion gap: 5 (ref 5–15)
BUN: 9 mg/dL (ref 6–20)
BUN: 8 mg/dL (ref 6–20)
CO2: 22 mmol/L (ref 22–32)
CO2: 25 mmol/L (ref 22–32)
Calcium: 8.7 mg/dL — ABNORMAL LOW (ref 8.9–10.3)
Calcium: 8.6 mg/dL — ABNORMAL LOW (ref 8.9–10.3)
Chloride: 102 mmol/L (ref 98–111)
Chloride: 105 mmol/L (ref 98–111)
Creatinine, Ser: 0.63 mg/dL (ref 0.44–1.00)
Creatinine, Ser: 0.56 mg/dL (ref 0.44–1.00)
GFR, Estimated: >60 mL/min (ref >60)
GFR, Estimated: >60 mL/min (ref >60)
Glucose, Bld: 319 mg/dL — ABNORMAL HIGH (ref 70–99)
Glucose, Bld: 273 mg/dL — ABNORMAL HIGH (ref 70–99)
Potassium: 4.5 mmol/L (ref 3.5–5.1)
Potassium: 4.3 mmol/L (ref 3.5–5.1)
Sodium: 132 mmol/L — ABNORMAL LOW (ref 135–145)
Sodium: 135 mmol/L (ref 135–145)

## 2021-05-04 LAB — CBC
HCT: 38.5 % (ref 36.0–46.0)
Hemoglobin: 12.1 g/dL (ref 12.0–15.0)
MCH: 26.1 pg (ref 26.0–34.0)
MCHC: 31.4 g/dL (ref 30.0–36.0)
MCV: 83 fL (ref 80.0–100.0)
Platelets: 358 10*3/uL (ref 150–400)
RBC: 4.64 MIL/uL (ref 3.87–5.11)
RDW: 14.6 % (ref 11.5–15.5)
WBC: 15 10*3/uL — ABNORMAL HIGH (ref 4.0–10.5)
nRBC: 0 % (ref 0.0–0.2)

## 2021-05-04 LAB — D-DIMER, QUANTITATIVE: D-Dimer, Quant: 0.52 ug/mL-FEU — ABNORMAL HIGH (ref 0.00–0.50)

## 2021-05-04 LAB — TROPONIN I (HIGH SENSITIVITY)
Troponin I (High Sensitivity): 2381 ng/L (ref <18)
Troponin I (High Sensitivity): 4212 ng/L (ref <18)
Troponin I (High Sensitivity): 6327 ng/L (ref <18)

## 2021-05-04 LAB — PROTIME-INR
INR: 1 (ref 0.8–1.2)
Prothrombin Time: 13.5 seconds (ref 11.4–15.2)

## 2021-05-04 LAB — CBC WITH DIFFERENTIAL/PLATELET
Abs Immature Granulocytes: 0.11 10*3/uL — ABNORMAL HIGH (ref 0.00–0.07)
Basophils Absolute: 0 10*3/uL (ref 0.0–0.1)
Basophils Relative: 0 %
Eosinophils Absolute: 0.1 10*3/uL (ref 0.0–0.5)
Eosinophils Relative: 0 %
HCT: 41.3 % (ref 36.0–46.0)
Hemoglobin: 13.2 g/dL (ref 12.0–15.0)
Immature Granulocytes: 1 %
Lymphocytes Relative: 11 %
Lymphs Abs: 1.8 10*3/uL (ref 0.7–4.0)
MCH: 26.3 pg (ref 26.0–34.0)
MCHC: 32 g/dL (ref 30.0–36.0)
MCV: 82.3 fL (ref 80.0–100.0)
Monocytes Absolute: 0.3 10*3/uL (ref 0.1–1.0)
Monocytes Relative: 2 %
Neutro Abs: 13.7 10*3/uL — ABNORMAL HIGH (ref 1.7–7.7)
Neutrophils Relative %: 86 %
Platelets: 401 10*3/uL — ABNORMAL HIGH (ref 150–400)
RBC: 5.02 MIL/uL (ref 3.87–5.11)
RDW: 14.6 % (ref 11.5–15.5)
WBC: 16 10*3/uL — ABNORMAL HIGH (ref 4.0–10.5)
nRBC: 0 % (ref 0.0–0.2)

## 2021-05-04 LAB — ECHOCARDIOGRAM COMPLETE
AR max vel: 3.03 cm2
AV Area VTI: 3.4 cm2
AV Area mean vel: 2.92 cm2
AV Mean grad: 2 mmHg
AV Peak grad: 3.7 mmHg
Ao pk vel: 0.96 m/s
Area-P 1/2: 3.36 cm2
Height: 65 in
S' Lateral: 3.05 cm
Weight: 4896 oz

## 2021-05-04 LAB — LIPID PANEL
Cholesterol: 193 mg/dL (ref 0–200)
HDL: 26 mg/dL — ABNORMAL LOW (ref >40)
LDL Cholesterol: 130 mg/dL — ABNORMAL HIGH (ref 0–99)
Total CHOL/HDL Ratio: 7.4 RATIO
Triglycerides: 184 mg/dL — ABNORMAL HIGH (ref <150)
VLDL: 37 mg/dL (ref 0–40)

## 2021-05-04 LAB — GLUCOSE, CAPILLARY
Glucose-Capillary: 176 mg/dL — ABNORMAL HIGH (ref 70–99)
Glucose-Capillary: 335 mg/dL — ABNORMAL HIGH (ref 70–99)
Glucose-Capillary: 370 mg/dL — ABNORMAL HIGH (ref 70–99)

## 2021-05-04 LAB — HCG, QUANTITATIVE, PREGNANCY: hCG, Beta Chain, Quant, S: <1 m[IU]/mL (ref <5)

## 2021-05-04 LAB — HEPARIN LEVEL (UNFRACTIONATED): Heparin Unfractionated: <0.1 IU/mL — ABNORMAL LOW (ref 0.30–0.70)

## 2021-05-04 LAB — CBG MONITORING, ED: Glucose-Capillary: 236 mg/dL — ABNORMAL HIGH (ref 70–99)

## 2021-05-04 LAB — SARS CORONAVIRUS 2 (TAT 6-24 HRS): SARS Coronavirus 2: NEGATIVE

## 2021-05-04 LAB — HIV ANTIBODY (ROUTINE TESTING W REFLEX): HIV Screen 4th Generation wRfx: NONREACTIVE

## 2021-05-04 SURGERY — LEFT HEART CATH AND CORONARY ANGIOGRAPHY
Anesthesia: Moderate Sedation

## 2021-05-04 MED ORDER — ALBUTEROL SULFATE (2.5 MG/3ML) 0.083% IN NEBU
2.5000 mg | INHALATION_SOLUTION | Freq: Four times a day (QID) | RESPIRATORY_TRACT | Status: DC | PRN
  Administered 2021-05-04: 2.5 mg via RESPIRATORY_TRACT
  Filled 2021-05-04 (×2): qty 3

## 2021-05-04 MED ORDER — MORPHINE SULFATE (PF) 2 MG/ML IV SOLN
2.0000 mg | INTRAVENOUS | Status: DC | PRN
  Administered 2021-05-04: 2 mg via INTRAVENOUS
  Filled 2021-05-04: qty 1

## 2021-05-04 MED ORDER — SODIUM CHLORIDE 0.9 % IV SOLN: INTRAVENOUS | Status: AC

## 2021-05-04 MED ORDER — METOPROLOL TARTRATE 25 MG PO TABS: 25.0000 mg | ORAL_TABLET | Freq: Two times a day (BID) | ORAL | Status: DC

## 2021-05-04 MED ORDER — SODIUM CHLORIDE 0.9% FLUSH
3.0000 mL | Freq: Two times a day (BID) | INTRAVENOUS | Status: DC
  Administered 2021-05-04 (×2): 3 mL via INTRAVENOUS

## 2021-05-04 MED ORDER — HEPARIN (PORCINE) IN NACL 1000-0.9 UT/500ML-% IV SOLN
INTRAVENOUS | Status: AC
  Filled 2021-05-04: qty 1000

## 2021-05-04 MED ORDER — HEPARIN SODIUM (PORCINE) 1000 UNIT/ML IJ SOLN
INTRAMUSCULAR | Status: AC
  Filled 2021-05-04: qty 1

## 2021-05-04 MED ORDER — ASPIRIN 81 MG PO CHEW
324.0000 mg | CHEWABLE_TABLET | Freq: Once | ORAL | Status: AC
  Administered 2021-05-04: 324 mg via ORAL
  Filled 2021-05-04: qty 4

## 2021-05-04 MED ORDER — ALBUTEROL SULFATE HFA 108 (90 BASE) MCG/ACT IN AERS: 1.0000 | INHALATION_SPRAY | Freq: Four times a day (QID) | RESPIRATORY_TRACT | Status: DC | PRN

## 2021-05-04 MED ORDER — VERAPAMIL HCL 2.5 MG/ML IV SOLN
INTRAVENOUS | Status: DC | PRN
  Administered 2021-05-04: 2.5 mg via INTRA_ARTERIAL

## 2021-05-04 MED ORDER — MIDAZOLAM HCL 2 MG/2ML IJ SOLN
INTRAMUSCULAR | Status: DC | PRN
  Administered 2021-05-04: 0.5 mg via INTRAVENOUS
  Administered 2021-05-04: 1 mg via INTRAVENOUS

## 2021-05-04 MED ORDER — ASPIRIN 81 MG PO CHEW
CHEWABLE_TABLET | ORAL | Status: AC
  Filled 2021-05-04: qty 1

## 2021-05-04 MED ORDER — SODIUM CHLORIDE 0.9 % IV SOLN: INTRAVENOUS | Status: DC

## 2021-05-04 MED ORDER — FENTANYL CITRATE (PF) 100 MCG/2ML IJ SOLN
INTRAMUSCULAR | Status: DC | PRN
  Administered 2021-05-04 (×2): 25 ug via INTRAVENOUS

## 2021-05-04 MED ORDER — HEPARIN BOLUS VIA INFUSION
2700.0000 [IU] | Freq: Once | INTRAVENOUS | Status: DC
  Filled 2021-05-04: qty 2700

## 2021-05-04 MED ORDER — HEPARIN BOLUS VIA INFUSION
4000.0000 [IU] | Freq: Once | INTRAVENOUS | Status: AC
  Administered 2021-05-04: 4000 [IU] via INTRAVENOUS
  Filled 2021-05-04: qty 4000

## 2021-05-04 MED ORDER — HEPARIN (PORCINE) 25000 UT/250ML-% IV SOLN
1450.0000 [IU]/h | INTRAVENOUS | Status: DC
  Administered 2021-05-04: 1200 [IU]/h via INTRAVENOUS
  Filled 2021-05-04: qty 250

## 2021-05-04 MED ORDER — NITROGLYCERIN IN D5W 200-5 MCG/ML-% IV SOLN
0.0000 ug/min | INTRAVENOUS | Status: DC
  Administered 2021-05-04: 5 ug/min via INTRAVENOUS

## 2021-05-04 MED ORDER — METOPROLOL TARTRATE 25 MG PO TABS: 12.5000 mg | ORAL_TABLET | Freq: Two times a day (BID) | ORAL | Status: DC

## 2021-05-04 MED ORDER — MORPHINE SULFATE (PF) 4 MG/ML IV SOLN
4.0000 mg | Freq: Once | INTRAVENOUS | Status: AC
Start: 2021-05-04 — End: 2021-05-04
  Administered 2021-05-04: 4 mg via INTRAVENOUS
  Filled 2021-05-04: qty 1

## 2021-05-04 MED ORDER — HEPARIN (PORCINE) IN NACL 1000-0.9 UT/500ML-% IV SOLN
INTRAVENOUS | Status: DC | PRN
  Administered 2021-05-04 (×2): 500 mL

## 2021-05-04 MED ORDER — METHYLPREDNISOLONE SODIUM SUCC 125 MG IJ SOLR
INTRAMUSCULAR | Status: AC
  Administered 2021-05-04: 125 mg
  Filled 2021-05-04: qty 2

## 2021-05-04 MED ORDER — ASPIRIN 81 MG PO CHEW
81.0000 mg | CHEWABLE_TABLET | Freq: Every day | ORAL | Status: DC
  Administered 2021-05-05: 81 mg via ORAL
  Filled 2021-05-04: qty 1

## 2021-05-04 MED ORDER — SODIUM CHLORIDE 0.9 % WEIGHT BASED INFUSION
3.0000 mL/kg/h | INTRAVENOUS | Status: DC
  Administered 2021-05-04: 3 mL/kg/h via INTRAVENOUS

## 2021-05-04 MED ORDER — DIPHENHYDRAMINE HCL 50 MG/ML IJ SOLN
INTRAMUSCULAR | Status: AC
  Administered 2021-05-04: 50 mg
  Filled 2021-05-04: qty 1

## 2021-05-04 MED ORDER — LIDOCAINE HCL (PF) 1 % IJ SOLN
INTRAMUSCULAR | Status: AC
  Filled 2021-05-04: qty 30

## 2021-05-04 MED ORDER — ENOXAPARIN SODIUM 80 MG/0.8ML IJ SOSY: 0.5000 mg/kg | PREFILLED_SYRINGE | INTRAMUSCULAR | Status: DC

## 2021-05-04 MED ORDER — ATORVASTATIN CALCIUM 80 MG PO TABS
80.0000 mg | ORAL_TABLET | Freq: Every day | ORAL | Status: DC
  Administered 2021-05-05: 80 mg via ORAL
  Filled 2021-05-04: qty 1

## 2021-05-04 MED ORDER — TICAGRELOR 90 MG PO TABS
ORAL_TABLET | ORAL | Status: AC
  Filled 2021-05-04: qty 2

## 2021-05-04 MED ORDER — FLUTICASONE PROPIONATE 50 MCG/ACT NA SUSP
1.0000 | Freq: Two times a day (BID) | NASAL | Status: DC | PRN
  Filled 2021-05-04: qty 16

## 2021-05-04 MED ORDER — ONDANSETRON HCL 4 MG/2ML IJ SOLN
4.0000 mg | Freq: Four times a day (QID) | INTRAMUSCULAR | Status: DC | PRN
  Administered 2021-05-04: 4 mg via INTRAVENOUS
  Filled 2021-05-04: qty 2

## 2021-05-04 MED ORDER — TICAGRELOR 90 MG PO TABS
90.0000 mg | ORAL_TABLET | Freq: Two times a day (BID) | ORAL | Status: DC
  Administered 2021-05-04 – 2021-05-05 (×2): 90 mg via ORAL
  Filled 2021-05-04 (×2): qty 1

## 2021-05-04 MED ORDER — NITROGLYCERIN 1 MG/10 ML FOR IR/CATH LAB
INTRA_ARTERIAL | Status: AC
  Filled 2021-05-04: qty 10

## 2021-05-04 MED ORDER — INSULIN DETEMIR 100 UNIT/ML ~~LOC~~ SOLN
10.0000 [IU] | Freq: Two times a day (BID) | SUBCUTANEOUS | Status: DC
  Administered 2021-05-04 – 2021-05-05 (×2): 10 [IU] via SUBCUTANEOUS
  Filled 2021-05-04 (×4): qty 0.1

## 2021-05-04 MED ORDER — ACETAMINOPHEN 325 MG PO TABS
ORAL_TABLET | ORAL | Status: AC
  Filled 2021-05-04: qty 2

## 2021-05-04 MED ORDER — NITROGLYCERIN 1 MG/10 ML FOR IR/CATH LAB
INTRA_ARTERIAL | Status: DC | PRN
Start: 2021-05-04 — End: 2021-05-04
  Administered 2021-05-04: 200 ug via INTRACORONARY

## 2021-05-04 MED ORDER — NITROGLYCERIN 0.4 MG SL SUBL
0.4000 mg | SUBLINGUAL_TABLET | SUBLINGUAL | Status: DC | PRN
  Administered 2021-05-04: 0.4 mg via SUBLINGUAL
  Filled 2021-05-04: qty 1

## 2021-05-04 MED ORDER — FENTANYL CITRATE (PF) 100 MCG/2ML IJ SOLN
INTRAMUSCULAR | Status: AC
  Filled 2021-05-04: qty 2

## 2021-05-04 MED ORDER — SODIUM CHLORIDE 0.9 % IV BOLUS
500.0000 mL | Freq: Once | INTRAVENOUS | Status: AC
  Administered 2021-05-04: 500 mL via INTRAVENOUS

## 2021-05-04 MED ORDER — ALPRAZOLAM 0.25 MG PO TABS: 0.2500 mg | ORAL_TABLET | Freq: Two times a day (BID) | ORAL | Status: DC | PRN

## 2021-05-04 MED ORDER — SODIUM CHLORIDE 0.9 % IV SOLN: 250.0000 mL | INTRAVENOUS | Status: DC | PRN

## 2021-05-04 MED ORDER — SODIUM CHLORIDE 0.9 % WEIGHT BASED INFUSION: 1.0000 mL/kg/h | INTRAVENOUS | Status: DC

## 2021-05-04 MED ORDER — MAGNESIUM HYDROXIDE 400 MG/5ML PO SUSP: 30.0000 mL | Freq: Every day | ORAL | Status: DC | PRN

## 2021-05-04 MED ORDER — HEPARIN SODIUM (PORCINE) 1000 UNIT/ML IJ SOLN
INTRAMUSCULAR | Status: DC | PRN
  Administered 2021-05-04: 3000 [IU] via INTRAVENOUS
  Administered 2021-05-04 (×2): 6000 [IU] via INTRAVENOUS

## 2021-05-04 MED ORDER — ASPIRIN 81 MG PO CHEW: 81.0000 mg | CHEWABLE_TABLET | ORAL | Status: DC

## 2021-05-04 MED ORDER — FAMOTIDINE 20 MG PO TABS
ORAL_TABLET | ORAL | Status: AC
  Administered 2021-05-04: 20 mg
  Filled 2021-05-04: qty 2

## 2021-05-04 MED ORDER — NITROGLYCERIN IN D5W 200-5 MCG/ML-% IV SOLN
0.0000 ug/min | INTRAVENOUS | Status: DC
  Filled 2021-05-04: qty 250

## 2021-05-04 MED ORDER — SODIUM CHLORIDE 0.9% FLUSH: 3.0000 mL | Freq: Two times a day (BID) | INTRAVENOUS | Status: DC

## 2021-05-04 MED ORDER — IOHEXOL 300 MG/ML  SOLN
INTRAMUSCULAR | Status: DC | PRN
  Administered 2021-05-04: 135 mL

## 2021-05-04 MED ORDER — MIDAZOLAM HCL 2 MG/2ML IJ SOLN
INTRAMUSCULAR | Status: AC
  Filled 2021-05-04: qty 2

## 2021-05-04 MED ORDER — INSULIN ASPART 100 UNIT/ML IJ SOLN
0.0000 [IU] | Freq: Three times a day (TID) | INTRAMUSCULAR | Status: DC
  Administered 2021-05-04: 5 [IU] via SUBCUTANEOUS
  Administered 2021-05-04: 11 [IU] via SUBCUTANEOUS
  Administered 2021-05-04: 15 [IU] via SUBCUTANEOUS
  Administered 2021-05-05: 8 [IU] via SUBCUTANEOUS
  Filled 2021-05-04 (×4): qty 1

## 2021-05-04 MED ORDER — FAMOTIDINE 20 MG PO TABS
20.0000 mg | ORAL_TABLET | Freq: Two times a day (BID) | ORAL | Status: DC
  Administered 2021-05-04 – 2021-05-05 (×2): 20 mg via ORAL
  Filled 2021-05-04 (×2): qty 1

## 2021-05-04 MED ORDER — EPINEPHRINE 0.3 MG/0.3ML IJ SOAJ: 0.3000 mg | Freq: Once | INTRAMUSCULAR | Status: DC

## 2021-05-04 MED ORDER — ASPIRIN 81 MG PO CHEW
CHEWABLE_TABLET | ORAL | Status: DC | PRN
Start: 2021-05-04 — End: 2021-05-04
  Administered 2021-05-04: 81 mg via ORAL

## 2021-05-04 MED ORDER — SODIUM CHLORIDE 0.9% FLUSH: 3.0000 mL | INTRAVENOUS | Status: DC | PRN

## 2021-05-04 MED ORDER — ASPIRIN EC 325 MG PO TBEC: 325.0000 mg | DELAYED_RELEASE_TABLET | Freq: Every day | ORAL | Status: DC

## 2021-05-04 MED ORDER — TICAGRELOR 90 MG PO TABS
ORAL_TABLET | ORAL | Status: DC | PRN
  Administered 2021-05-04: 180 mg via ORAL

## 2021-05-04 MED ORDER — LIDOCAINE HCL (PF) 1 % IJ SOLN
INTRAMUSCULAR | Status: DC | PRN
  Administered 2021-05-04: 2 mL

## 2021-05-04 MED ORDER — ACETAMINOPHEN 325 MG PO TABS
650.0000 mg | ORAL_TABLET | ORAL | Status: DC | PRN
  Administered 2021-05-04 (×2): 650 mg via ORAL
  Filled 2021-05-04: qty 2

## 2021-05-04 MED ORDER — EPINEPHRINE 0.3 MG/0.3ML IJ SOAJ
0.3000 mg | Freq: Once | INTRAMUSCULAR | Status: DC | PRN
  Filled 2021-05-04: qty 0.3

## 2021-05-04 MED ORDER — VERAPAMIL HCL 2.5 MG/ML IV SOLN
INTRAVENOUS | Status: AC
  Filled 2021-05-04: qty 2

## 2021-05-04 MED ORDER — ZOLPIDEM TARTRATE 5 MG PO TABS: 5.0000 mg | ORAL_TABLET | Freq: Every evening | ORAL | Status: DC | PRN

## 2021-05-04 MED ORDER — METHYLPREDNISOLONE SODIUM SUCC 40 MG IJ SOLR: 40.0000 mg | Freq: Once | INTRAMUSCULAR | Status: DC

## 2021-05-04 MED ORDER — DIPHENHYDRAMINE HCL 25 MG PO CAPS: 25.0000 mg | ORAL_CAPSULE | ORAL | Status: DC | PRN

## 2021-05-04 SURGICAL SUPPLY — 19 items
BALLN TREK RX 2.5X12 (BALLOONS) ×2
BALLN ~~LOC~~ TREK RX 2.75X15 (BALLOONS) ×2
BALLOON TREK RX 2.5X12 (BALLOONS) IMPLANT
BALLOON ~~LOC~~ TREK RX 2.75X15 (BALLOONS) IMPLANT
CATH INFINITI 5FR JK (CATHETERS) ×1 IMPLANT
CATH LAUNCHER 6FR JR4 (CATHETERS) ×1 IMPLANT
DEVICE RAD TR BAND REGULAR (VASCULAR PRODUCTS) ×1 IMPLANT
DRAPE BRACHIAL (DRAPES) ×1 IMPLANT
GLIDESHEATH SLEND SS 6F .021 (SHEATH) ×1 IMPLANT
GUIDEWIRE INQWIRE 1.5J.035X260 (WIRE) IMPLANT
INQWIRE 1.5J .035X260CM (WIRE) ×2
KIT ENCORE 26 ADVANTAGE (KITS) ×1 IMPLANT
PACK CARDIAC CATH (CUSTOM PROCEDURE TRAY) ×2 IMPLANT
PROTECTION STATION PRESSURIZED (MISCELLANEOUS) ×2
SET ATX SIMPLICITY (MISCELLANEOUS) ×1 IMPLANT
STATION PROTECTION PRESSURIZED (MISCELLANEOUS) IMPLANT
STENT RESOLUTE ONYX 2.5X26 (Permanent Stent) ×1 IMPLANT
TUBING CIL FLEX 10 FLL-RA (TUBING) ×1 IMPLANT
WIRE RUNTHROUGH .014X180CM (WIRE) ×1 IMPLANT

## 2021-05-04 NOTE — ED Provider Notes (Signed)
[facility] Emergency Department Provider Note  ____________________________________________  Time seen: Approximately 2:17 AM  I have reviewed the triage vital signs and the nursing notes.   HISTORY  Chief Complaint Chest Pain   HPI Holly Garner is a 40 y.o. female with a history of obesity, smoking, diabetes, hypertension who presents for evaluation of chest pain.  Patient was seen here yesterday and diagnosed with an NSTEMI.  She was supposed to have gone to Cath Lab but decided to leave AGAINST MEDICAL ADVICE.  An hour ago patient started having pain again.  She describes the pain as a squeezing sensation located in the center of her chest.  She describes mild shortness of breath and nausea associated with it.  Her pain is moderate in intensity at this time.   Past Medical History:  Diagnosis Date   BMI 50.0-59.9, adult (HCC)    History of Papanicolaou smear of cervix 04/2011   normal per pt.   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   hgsil    Patient Active Problem List   Diagnosis Date Noted   Chest pain 05/03/2021   Cellulitis of chest wall 05/03/2021   NSTEMI (non-ST elevated myocardial infarction) (HCC) 05/03/2021   Sepsis (HCC) 05/03/2021   Tobacco abuse 05/03/2021   Pure hypercholesterolemia    Diabetes mellitus without complication (HCC) 04/17/2019   HTN (hypertension) 04/07/2019   Swelling 03/24/2019   Morbid obesity (HCC) 03/24/2019   Seasonal allergies 03/24/2019    Past Surgical History:  Procedure Laterality Date   COLPOSCOPY  09/28/2009   TONSILLECTOMY AND ADENOIDECTOMY     TYMPANOSTOMY TUBE PLACEMENT Bilateral     Prior to Admission medications   Medication Sig Start Date End Date Taking? Authorizing Provider  doxycycline (VIBRAMYCIN) 100 MG capsule Take 1 capsule (100 mg total) by mouth 2 (two) times daily for 14 days. 04/21/21 05/05/21  Copland, Ilona Sorrel, PA-C  EPINEPHrine (EPIPEN 2-PAK) 0.3 mg/0.3 mL  IJ SOAJ injection Inject 0.3 mLs (0.3 mg total) into the muscle once. 01/07/16   Gouru, Deanna Artis, MD  fluticasone (FLONASE) 50 MCG/ACT nasal spray Place 1 spray into both nostrils 2 (two) times daily. 11/30/20 12/30/20  [place], Betty G, MD  PROAIR HFA 108 [phone] Base) MCG/ACT inhaler Inhale 1-2 puffs into the lungs every 6 (six) hours as needed for wheezing or shortness of breath. 12/01/20   Lorre Munroe, NP    Allergies Malt, Milk-related compounds, Other, Shellfish allergy, and Soy allergy  Family History  Problem Relation Age of Onset   Breast cancer Maternal Grandmother        late years    Social History Social History   Tobacco Use   Smoking status: Every Day    Packs/day: 1.00    Years: 10.00    Pack years: 10.00    Types: Cigarettes   Smokeless tobacco: Never  Vaping Use   Vaping Use: Never used  Substance Use Topics   Alcohol use: No   Drug use: No    Review of Systems  Constitutional: Negative for fever. Eyes: Negative for visual changes. ENT: Negative for sore throat. Neck: No neck pain  Cardiovascular: + chest pain. Respiratory: + shortness of breath. Gastrointestinal: Negative for abdominal pain, vomiting or diarrhea. Genitourinary: Negative for dysuria. Musculoskeletal: Negative for back pain. Skin: Negative for rash. Neurological: Negative for headaches, weakness or numbness. Psych: No SI or HI  ____________________________________________   PHYSICAL EXAM:  VITAL SIGNS: ED Triage Vitals [05/04/21 0159]  Enc Vitals Group     BP (!) 147/80     Pulse Rate 99     Resp (!) 23     Temp 97.6 F (36.4 C)     Temp Source Oral     SpO2 99 %     Weight (!) 306 lb (138.8 kg)     Height 5\' 5"  (1.651 m)     Head Circumference      Peak Flow      Pain Score 3     Pain Loc      Pain Edu?      Excl. in GC?     Constitutional: Alert and oriented. Well appearing and in no apparent distress. HEENT:      Head: Normocephalic and atraumatic.         Eyes:  Conjunctivae are normal. Sclera is non-icteric.       Mouth/Throat: Mucous membranes are moist.       Neck: Supple with no signs of meningismus. Cardiovascular: Regular rate and rhythm. No murmurs, gallops, or rubs. 2+ symmetrical distal pulses are present in all extremities. No JVD. Respiratory: Normal respiratory effort. Lungs are clear to auscultation bilaterally.  Gastrointestinal: Soft, non tender, and non distended with positive bowel sounds. No rebound or guarding. Genitourinary: No CVA tenderness. Musculoskeletal:  No edema, cyanosis, or erythema of extremities. Neurologic: Normal speech and language. Face is symmetric. Moving all extremities. No gross focal neurologic deficits are appreciated. Skin: Skin is warm, dry and intact. No rash noted. Psychiatric: Mood and affect are normal. Speech and behavior are normal.  ____________________________________________   LABS (all labs ordered are listed, but only abnormal results are displayed)  Labs Reviewed  CBC WITH DIFFERENTIAL/PLATELET - Abnormal; Notable for the following components:      Result Value   WBC 16.0 (*)    Platelets 401 (*)    Neutro Abs 13.7 (*)    Abs Immature Granulocytes 0.11 (*)    All other components within normal limits  BASIC METABOLIC PANEL  HCG, QUANTITATIVE, PREGNANCY  TROPONIN I (HIGH SENSITIVITY)   ____________________________________________  EKG  ED ECG REPORT I, , the attending physician, personally viewed and interpreted this ECG.  Sinus rhythm with a rate of 88, normal intervals, minimal ST elevations in inferior leads with ST depressions in the lateral leads.  Unchanged from EKG done yesterday.  Normal sinus rhythm, rate of 88, unchanged from initial. ____________________________________________  RADIOLOGY  I have personally reviewed the images performed during this visit and I agree with the Radiologist's read.   Interpretation by Radiologist:  DG Chest 2  View  Result Date: 05/03/2021 CLINICAL DATA:  Chest pain EXAM: CHEST - 2 VIEW COMPARISON:  10/18/2017 FINDINGS: The heart size and mediastinal contours are within normal limits. Mildly prominent interstitial markings bilaterally, most pronounced within the right lung base. No pleural effusion or pneumothorax. The visualized skeletal structures are unremarkable. IMPRESSION: Mildly prominent interstitial markings bilaterally, most pronounced within the right lung base. Findings may reflect bronchitic type lung changes versus developing atypical/viral infection. Electronically Signed   By: 10/20/2017 D.O.   On: 05/03/2021 13:14   CT ANGIO CHEST AORTA W/CM & OR WO/CM  Result Date: 05/03/2021 CLINICAL DATA:  Chest pain in the center hyper chest radiating to the back. EXAM: CT ANGIOGRAPHY CHEST WITH CONTRAST TECHNIQUE: Multidetector CT imaging of the chest was performed using the standard protocol during bolus administration of intravenous contrast. Multiplanar CT image reconstructions and MIPs were obtained to  evaluate the vascular anatomy. CONTRAST:  100mL OMNIPAQUE IOHEXOL 350 MG/ML SOLN COMPARISON:  Two-view chest x-ray 05/03/2021. CT chest with contrast 01/06/2016 FINDINGS: Cardiovascular: Heart size is normal. Arch, great vessels, and descending thoracic aorta is normal. No dissection. Pulmonary arteries of normal size. Although the study was not optimized for detection of pulmonary emboli, no central filling defects evident. Mediastinum/Nodes: No enlarged mediastinal, hilar, or axillary lymph nodes. Thyroid gland, trachea, and esophagus demonstrate no significant findings. Lungs/Pleura: Lungs are clear. No nodule, mass, or airspace disease is present. No significant pleural disease is present. Upper Abdomen: Aorta is within normal limits to the level of the renal arteries. Upper abdomen is unremarkable. Musculoskeletal: Sclerotic endplate changes are present within the thoracic spine at multiple levels.  These are stable, associated with Schmorl's nodes. Vertebral body heights and alignment are normal. Review of the MIP images confirms the above findings. IMPRESSION: 1. No acute or focal lesion to explain the patient's symptoms. 2. 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 Electronically Signed   By: Marin Robertshristopher  Mattern M.D.   On: 05/03/2021 14:54     ____________________________________________   PROCEDURES  Procedure(s) performed:yes .1-3 Lead EKG Interpretation  Date/Time: 05/04/2021 2:22 AM Performed by: Nita SickleVeronese, [place], MD Authorized by: Nita SickleVeronese, [place], MD     Interpretation: non-specific     ECG rate assessment: normal     Rhythm: sinus rhythm     Ectopy: none     Conduction: normal     Critical Care performed: yes  CRITICAL CARE Performed by: Nita Sicklearolina Alivia Cimino  ?  Total critical care time: 30 min  Critical care time was exclusive of separately billable procedures and treating other patients.  Critical care was necessary to treat or prevent imminent or life-threatening deterioration.  Critical care was time spent personally by me on the following activities: development of treatment plan with patient and/or surrogate as well as nursing, discussions with consultants, evaluation of patient's response to treatment, examination of patient, obtaining history from patient or surrogate, ordering and performing treatments and interventions, ordering and review of laboratory studies, ordering and review of radiographic studies, pulse oximetry and re-evaluation of patient's condition.  ____________________________________________   INITIAL IMPRESSION / ASSESSMENT AND PLAN / ED COURSE  40 y.o. female with a history of obesity, smoking, diabetes, hypertension who presents for evaluation of chest pain.  Patient was seen earlier today and diagnosed with an NSTEMI.  The plan was to take her to the Cath Lab in the morning but patient left AMA once her  pain went away.  She is back now with moderate chest pain.  Hemodynamically stable.  Initial EKG does not meet STEMI criteria.  Repeat EKG stable.  We will start patient on heparin drip, nitro drip, will give a full dose of aspirin, will get troponin and repeat labs and admit to the hospitalist service.  Patient also had a CT angio of the chest showing no dissection or PE on previous visit to the ED.  Patient placed on telemetry for monitoring of cardiorespiratory status.      _____________________________________________ Please note:  Patient was evaluated in Emergency Department today for the symptoms described in the history of present illness. Patient was evaluated in the context of the global COVID-19 pandemic, which necessitated consideration that the patient might be at risk for infection with the SARS-CoV-2 virus that causes COVID-19. Institutional protocols and algorithms that pertain to the evaluation of patients at risk for COVID-19 are in a state of rapid change based  on information released by regulatory bodies including the CDC and federal and state organizations. These policies and algorithms were followed during the patient's care in the ED.  Some ED evaluations and interventions may be delayed as a result of limited staffing during the pandemic.    Controlled Substance Database was reviewed by me. ____________________________________________   FINAL CLINICAL IMPRESSION(S) / ED DIAGNOSES   Final diagnoses:  NSTEMI (non-ST elevated myocardial infarction) (HCC)      NEW MEDICATIONS STARTED DURING THIS VISIT:  ED Discharge Orders     None        Note:  This document was prepared using Dragon voice recognition software and may include unintentional dictation errors.    Don Perking, [place], MD 05/04/21 [phone]

## 2021-05-04 NOTE — Progress Notes (Signed)
*  PRELIMINARY RESULTS* Echocardiogram 2D Echocardiogram has been performed.  Cristela Blue 05/04/2021, 9:40 AM

## 2021-05-04 NOTE — Consult Note (Signed)
Cardiology Consultation:   Patient ID: Holly Garner MRN: [id number]; DOB: 1981/08/17  Admit date: 05/04/2021 Date of Consult: 05/04/2021  PCP:  Lorre Munroe, NP   [facility]  Cardiologist:  [facility], Dr. Mariah Milling Advanced Practice Provider:  No care team member to display Electrophysiologist:  None [id number]    Patient Profile:   Holly Garner is a 40 y.o. female with a hx of DM2, family history of heart dz, and who is being seen today for the evaluation of NSTEMI at the request of Dr. Arville Care.  History of Present Illness:   Holly Garner is a 40 year old female with history of DM2, current tobacco use, hypertension, obesity, and who presents today for non-STEMI.  Prior to this admission, she denies cardiac history.  Family history includes a grandfather with cardiac death at age 49 and father with heart disease.  She is a current smoker.  She reports rare alcohol use.  No illegal drugs.  Approximately 3 weeks ago, she reports experiencing chest pain like that which brought her into the emergency department today.  She reports that the chest pain started when she was in a food truck and it was very hot outside.  She had this attributed the pain to the heat and did not do anything about it.  Approximately 2 days ago, she awoke to go to the bathroom and immediately noticed that she was having squeezing chest pain.  She initially presented to [facility] emergency department yesterday 7/13; however, she left AMA. She presents again today for the same CP. Chest pain is described as central, squeezing pain that lasts minutes before subsiding and has been waxing and waning since 2 days ago.  CP also is occasionally pleuritic and TTP.  No associated symptoms.  She denies any associated shortness of breath, diaphoresis, nausea, emesis, presyncope, syncope, or tachypalpitations.    She does report orthopnea and is unable to sleep flat due to shortness of breath.  She  reports that she has never had chest pain like this before.  She reports a history of contact dermatitis and swelling, for which she uses prednisone (and recently used prednisone).  She reports occasional asymmetric lower extremity edema of unknown etiology.  She has been to several doctors about the swelling and states she does not know the cause.  At the time of consultation, she is on 3 L nasal cannula oxygen.  The nurses report that this is more for comfort and not for hypoxia.  Chest pain at the time of consultation and with more pain is rated 4/10.  It continues to be squeezing and central chest pain.  Past Medical History:  Diagnosis Date   BMI 50.0-59.9, adult (HCC)    History of Papanicolaou smear of cervix 04/2011   normal per pt.   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 Authorizing Provider  doxycycline (VIBRAMYCIN) 100 MG capsule Take 1 capsule (100 mg total) by mouth 2 (two) times daily for 14 days. 04/21/21 05/05/21 Yes Copland, Ilona Sorrel, PA-C  EPINEPHrine (EPIPEN 2-PAK) 0.3 mg/0.3 mL IJ SOAJ injection Inject 0.3 mLs (0.3 mg total) into the muscle once. 01/07/16  Yes Ramonita Lab, MD  PROAIR HFA 108 (90 Base) MCG/ACT inhaler Inhale 1-2 puffs into the lungs every 6 (six) hours as needed for wheezing or shortness of breath. 12/01/20  Yes Baity, Salvadore Oxford, NP  fluticasone (FLONASE) 50 MCG/ACT nasal spray Place 1 spray into both nostrils 2 (two) times daily. 11/30/20 12/30/20  [place], Betty G, MD    Inpatient Medications: Scheduled Meds:  [START ON 05/05/2021] aspirin EC  325 mg Oral Daily   atorvastatin  80 mg Oral Daily   insulin aspart  0-15 Units Subcutaneous TID AC & HS   metoprolol tartrate  12.5 mg  Oral BID   Continuous Infusions:  sodium chloride     heparin 1,200 Units/hr (05/04/21 0226)   nitroGLYCERIN Stopped (05/04/21 0525)   PRN Meds: acetaminophen, albuterol, ALPRAZolam, EPINEPHrine, fluticasone, magnesium hydroxide, morphine injection, nitroGLYCERIN, ondansetron (ZOFRAN) IV, zolpidem  Allergies:    Allergies  Allergen Reactions   Malt    Milk-Related Compounds    Other Other (See Comments)   Shellfish Allergy    Soy Allergy     Social History:   Social History   Socioeconomic History   Marital status: Married    Spouse name: Not on file   Number of children: Not on file   Years of education: 12   Highest education level: Not on file  Occupational History   Occupation: unemployed   Occupation: homemaker  Tobacco Use   Smoking status: Every Day    Packs/day: 1.00    Years: 10.00    Pack years: 10.00    Types: Cigarettes   Smokeless tobacco: Never  Vaping Use   Vaping Use: Never used  Substance and Sexual Activity   Alcohol use: No   Drug use: No   Sexual activity: Yes    Birth control/protection: Surgical    Comment: Vasectomy  Other Topics Concern   Not on file  Social History Narrative   Not on file   Social Determinants of Health   Financial Resource Strain: Not on file  Food Insecurity: Not on file  Transportation Needs: Not on file  Physical Activity: Not on file  Stress: Not on file  Social Connections: Not on file  Intimate Partner Violence: Not on file    Family History:   She reports a grandfather with cardiac death at age 36 and father with cardiac disease. Family History  Problem Relation Age of Onset   Breast cancer Maternal Grandmother        late years     ROS:  Please see the history of present illness.  Review of Systems  Respiratory:  Negative for cough and hemoptysis.   Cardiovascular:  Positive for chest pain, orthopnea and leg swelling. Negative for palpitations.  Gastrointestinal:  Negative for heartburn,  melena, nausea and vomiting.       Bloating  Musculoskeletal:  Positive for myalgias.  Neurological:  Negative for dizziness and focal weakness.   All other ROS reviewed and negative.     Physical Exam/Data:   Vitals:   05/04/21 0615 05/04/21 0732 05/04/21 0735 05/04/21 0745  BP: 125/70 (!) 119/55 (!) 105/46 113/60  Pulse: 70 67 69 80  Resp: (!) 21 15 19 17   Temp:      TempSrc:      SpO2: 97% 94% 95% 100%  Weight:      Height:       No intake or output data in the 24 hours ending 05/04/21 0840 Last 3 Weights 05/04/2021 05/03/2021 04/21/2021  Weight (lbs) 306  lb 304 lb 3.8 oz 306 lb  Weight (kg) 138.801 kg 138 kg 138.801 kg     Body mass index is 50.92 kg/m.  General: Obese female, no acute distress HEENT: normal Lymph: no adenopathy Neck: JVD difficult to assess due to body habitus Endocrine:  No thryomegaly Vascular: No carotid bruits; radial pulses 2+ bilaterally Cardiac:  normal S1, S2; RRR; no murmur  Lungs: Faint right base crackles otherwise CTAB Abd: Obese, soft, nontender, no hepatomegaly  Ext: no edema Musculoskeletal:  No deformities, BUE and BLE strength normal and equal Skin: warm and dry  Neuro:  no focal abnormalities noted Psych:  Normal affect   EKG:  The EKG was personally reviewed and demonstrates: NSR, ST elevation in the inferior leads but not meeting STEMI criteria, nonspecific ST changes in the lateral leads, PVCs Telemetry:  Telemetry was personally reviewed and demonstrates: NSR, PACs, PVCs  Relevant CV Studies: Echo pending  Laboratory Data:  High Sensitivity Troponin:   Recent Labs  Lab 05/03/21 1223 05/03/21 1523 05/03/21 1833 05/04/21 0158 05/04/21 0528  TROPONINIHS 314* 427* 689* 2,381* 4,212*     Chemistry Recent Labs  Lab 05/03/21 1223 05/04/21 0158 05/04/21 0528  NA 135 132* 135  K 3.8 4.5 4.3  CL 97* 102 105  CO2 25 22 25   GLUCOSE 230* 319* 273*  BUN 10 9 8   CREATININE 0.53 0.63 0.56  CALCIUM 8.9 8.7* 8.6*  GFRNONAA  >60 >60 >60  ANIONGAP 13 8 5     No results for input(s): PROT, ALBUMIN, AST, ALT, ALKPHOS, BILITOT in the last 168 hours. Hematology Recent Labs  Lab 05/03/21 1223 05/04/21 0158 05/04/21 0528  WBC 17.6* 16.0* 15.0*  RBC 5.44* 5.02 4.64  HGB 14.3 13.2 12.1  HCT 44.3 41.3 38.5  MCV 81.4 82.3 83.0  MCH 26.3 26.3 26.1  MCHC 32.3 32.0 31.4  RDW 14.6 14.6 14.6  PLT 430* 401* 358   BNPNo results for input(s): BNP, PROBNP in the last 168 hours.  DDimer No results for input(s): DDIMER in the last 168 hours.   Radiology/Studies:  DG Chest 2 View  Result Date: 05/03/2021 CLINICAL DATA:  Chest pain EXAM: CHEST - 2 VIEW COMPARISON:  10/18/2017 FINDINGS: The heart size and mediastinal contours are within normal limits. Mildly prominent interstitial markings bilaterally, most pronounced within the right lung base. No pleural effusion or pneumothorax. The visualized skeletal structures are unremarkable. IMPRESSION: Mildly prominent interstitial markings bilaterally, most pronounced within the right lung base. Findings may reflect bronchitic type lung changes versus developing atypical/viral infection. Electronically Signed   By: Duanne GuessNicholas  Plundo D.O.   On: 05/03/2021 13:14   DG Chest Portable 1 View  Result Date: 05/04/2021 CLINICAL DATA:  40 year old female with chest pain EXAM: PORTABLE CHEST 1 VIEW COMPARISON:  chest radiograph dated 05/03/2021 and CT dated 05/03/2021 FINDINGS: No focal consolidation, pleural effusion, or pneumothorax. The cardiac silhouette is within normal limits. No acute osseous pathology. IMPRESSION: No active disease. Electronically Signed   By: Elgie CollardArash  Radparvar M.D.   On: 05/04/2021 02:36   CT ANGIO CHEST AORTA W/CM & OR WO/CM  Result Date: 05/03/2021 CLINICAL DATA:  Chest pain in the center hyper chest radiating to the back. EXAM: CT ANGIOGRAPHY CHEST WITH CONTRAST TECHNIQUE: Multidetector CT imaging of the chest was performed using the standard protocol during bolus  administration of intravenous contrast. Multiplanar CT image reconstructions and MIPs were obtained to evaluate the vascular anatomy. CONTRAST:  100mL OMNIPAQUE IOHEXOL 350 MG/ML SOLN COMPARISON:  Two-view  chest x-ray 05/03/2021. CT chest with contrast 01/06/2016 FINDINGS: Cardiovascular: Heart size is normal. Arch, great vessels, and descending thoracic aorta is normal. No dissection. Pulmonary arteries of normal size. Although the study was not optimized for detection of pulmonary emboli, no central filling defects evident. Mediastinum/Nodes: No enlarged mediastinal, hilar, or axillary lymph nodes. Thyroid gland, trachea, and esophagus demonstrate no significant findings. Lungs/Pleura: Lungs are clear. No nodule, mass, or airspace disease is present. No significant pleural disease is present. Upper Abdomen: Aorta is within normal limits to the level of the renal arteries. Upper abdomen is unremarkable. Musculoskeletal: Sclerotic endplate changes are present within the thoracic spine at multiple levels. These are stable, associated with Schmorl's nodes. Vertebral body heights and alignment are normal. Review of the MIP images confirms the above findings. IMPRESSION: 1. No acute or focal lesion to explain the patient's symptoms. 2. 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 Electronically Signed   By: Marin Roberts M.D.   On: 05/03/2021 14:54     Assessment and Plan:   Non-STEMI -- Current chest pain.  High-sensitivity troponin 4212 and still trending.  EKG with ST changes in the inferior leads and nonspecific changes in the lateral leads, though not meeting STEMI criteria.  She was started on IV nitro, though this discontinued due to hypotension.  She reports current chest pain, despite morphine, and rated 4/10. --Continue ASA.  Continue beta-blocker as BP allows.  LDL 130 recommend addition of statin before discharge.  She has not tolerated IV nitro due to hypotension. --Echo  ordered, pending. --D-dimer ordered, pending. --Continue to cycle HS Tn until peaked, down-trending. --Continue NPO status. --Plan for left heart catheterization today.  Risks and benefits of the procedure were reviewed, as well as alternatives.  Shared Decision Making/Informed Consent The risks [stroke (1 in 1000), death (1 in 1000), kidney failure [usually temporary] (1 in 500), bleeding (1 in 200), allergic reaction [possibly serious] (1 in 200)], benefits (diagnostic support and management of coronary artery disease) and alternatives of a cardiac catheterization were discussed in detail with Ms. Debo and she is willing to proceed.   HTN --Continue current medications.  DM2 -- SSI, per IM.     TIMI Risk Score for Unstable Angina or Non-ST Elevation MI:   The patient's TIMI risk score is 5, which indicates a 26% risk of all cause mortality, new or recurrent myocardial infarction or need for urgent revascularization in the next 14 days.   New York Heart Association (NYHA) Functional Class NYHA Class I        For questions or updates, please contact [facility] Please consult [email or web address] for contact info under    Signed, Lennon Alstrom, PA-C  05/04/2021 8:40 AM

## 2021-05-04 NOTE — ED Notes (Signed)
Pt resting at this time. IV heparin infusing at this time. Per night shift RN pt couldn't handle IV nitro at this time due to headache. Pt is A&Ox4. Husband with pt at this time. Pt on 2L/min via [place] for comfort.

## 2021-05-04 NOTE — ED Notes (Signed)
ED Provider at bedside. 

## 2021-05-04 NOTE — H&P (View-Only) (Signed)
ANTICOAGULATION CONSULT NOTE  Pharmacy Consult for heparin infusion Indication: ACS/STEMI  Allergies  Allergen Reactions   Malt    Milk-Related Compounds    Other Other (See Comments)   Shellfish Allergy    Soy Allergy     Patient Measurements: Height: 5' 5" (165.1 cm) Weight: (!) 138.8 kg (306 lb) IBW/kg (Calculated) : 57 Heparin Dosing Weight: 91.5 kg  Vital Signs: Temp: 97.6 F (36.4 C) (07/14 0159) Temp Source: Oral (07/14 0159) BP: 147/80 (07/14 0159) Pulse Rate: 99 (07/14 0159)  Labs: Recent Labs    05/03/21 1223 05/03/21 1523 05/03/21 1833  HGB 14.3  --   --   HCT 44.3  --   --   PLT 430*  --   --   APTT  --  24  --   LABPROT  --  13.0  --   INR  --  1.0  --   CREATININE 0.53  --   --   TROPONINIHS 314* 427* 689*    Estimated Creatinine Clearance: 136.3 mL/min (by C-G formula based on SCr of 0.53 mg/dL).   Medical History: Past Medical History:  Diagnosis Date   BMI 50.0-59.9, adult (HCC)    History of Papanicolaou smear of cervix 04/2011   normal per pt.   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  Goal of Therapy:  Heparin level 0.3-0.7 units/ml Monitor platelets by anticoagulation protocol: Yes   Plan:  Give 4000 units bolus x 1 Start heparin infusion at 1200 units/hr Check anti-Xa level in 6 hours and daily while on heparin Continue to monitor H&H and platelets  Ophie Burrowes S. Derriona Branscom, PharmD, MBA 05/04/2021 2:11 AM    

## 2021-05-04 NOTE — Progress Notes (Signed)
PROGRESS NOTE    Holly Garner  KZS:[id number] DOB: 01/31/1981 DOA: 05/04/2021 PCP: Lorre Munroe, NP   Chief complaint.  Chest pain. Brief Narrative:   Holly Garner is a 40 y.o. obese Caucasian female with medical history significant for Type II diabetes mellitus, hypertension, obesity with BMI of 50.6, ongoing tobacco abuse, who presented to the emergency room with recurrent central chest pain graded 6/10 in severity and felt as a squeezing pain with no nausea or vomiting.  Her peak troponin was 6327.  She had a coronary angiogram, was found to have nonocclusive coronary disease risk less than 60%.  But has distal RCA blockage.  She will be treated medically with dual antiplatelet treatment  Assessment & Plan:   Active Problems:   NSTEMI (non-ST elevated myocardial infarction) (HCC)  #1.  Non-STEMI. Status post heart cath, no need for intervention.  Continue dual antiplatelet treatment and Lipitor. She probably will be discharged home tomorrow.  2.  Allergic reaction with skin redness. Patient has significant itching in her upper chest.  Skin was red in her neck, upper chest, bilateral arms.  Most likely this is due to IV contrast.  She will be given a dose of IV Solu-Medrol, as needed Benadryl and scheduled Pepcid.  #3.  Uncontrolled type 2 diabetes with hyperglycemia. A1c more than 10.8.  We will give scheduled Levemir and sliding scale insulin.  4.  Morbid obesity.    DVT prophylaxis:  Code Status:  Family Communication:  Disposition Plan:    Status is: Inpatient  Remains inpatient appropriate because:Inpatient level of care appropriate due to severity of illness  Dispo: The patient is from: Home              Anticipated d/c is to: Home              Patient currently is not medically stable to d/c.   Difficult to place patient No        No intake/output data recorded. No intake/output data recorded.     Consultants:  cardiology  Procedures:  heart cath  Antimicrobials: None   Subjective: Patient complains of skin itching.  Her skin was red involving her neck, upper chest, bilateral arms. Patient does not have any chest pain or shortness of breath. She has no fever or chills. She has no nausea vomiting.  No abdominal pain or diarrhea. No dysuria hematuria.  Objective: Vitals:   05/04/21 1054 05/04/21 1226 05/04/21 1230 05/04/21 1245  BP:  (!) 111/57 (!) 103/52 (!) 99/51  Pulse:  60 62 63  Resp:    20  Temp:      TempSrc:      SpO2: 100% 96% 96% 100%  Weight:      Height:       No intake or output data in the 24 hours ending 05/04/21 1259 Filed Weights   05/04/21 0159 05/04/21 1033  Weight: (!) 138.8 kg (!) 138.8 kg    Examination:  General exam: Appears calm and comfortable  Respiratory system: Clear to auscultation. Respiratory effort normal. Cardiovascular system: S1 & S2 heard, RRR. No JVD, murmurs, rubs, gallops or clicks. No pedal edema. Gastrointestinal system: Abdomen is nondistended, soft and nontender. No organomegaly or masses felt. Normal bowel sounds heard. Central nervous system: Alert and oriented. No focal neurological deficits. Extremities: Symmetric 5 x 5 power. Skin: Skin red involving her neck, upper chest and bilateral arms. Psychiatry: Judgement and insight appear normal. Mood & affect appropriate.  Data Reviewed: I have personally reviewed following labs and imaging studies  CBC: Recent Labs  Lab 05/03/21 1223 05/04/21 0158 05/04/21 0528  WBC 17.6* 16.0* 15.0*  NEUTROABS  --  13.7*  --   HGB 14.3 13.2 12.1  HCT 44.3 41.3 38.5  MCV 81.4 82.3 83.0  PLT 430* 401* 358   Basic Metabolic Panel: Recent Labs  Lab 05/03/21 1223 05/04/21 0158 05/04/21 0528  NA 135 132* 135  K 3.8 4.5 4.3  CL 97* 102 105  CO2 25 22 25   GLUCOSE 230* 319* 273*  BUN 10 9 8   CREATININE 0.53 0.63 0.56  CALCIUM 8.9 8.7* 8.6*   GFR: Estimated Creatinine Clearance: 136.3 mL/min (by C-G  formula based on SCr of 0.56 mg/dL). Liver Function Tests: No results for input(s): AST, ALT, ALKPHOS, BILITOT, PROT, ALBUMIN in the last 168 hours. No results for input(s): LIPASE, AMYLASE in the last 168 hours. No results for input(s): AMMONIA in the last 168 hours. Coagulation Profile: Recent Labs  Lab 05/03/21 1523 05/04/21 0528  INR 1.0 1.0   Cardiac Enzymes: No results for input(s): CKTOTAL, CKMB, CKMBINDEX, TROPONINI in the last 168 hours. BNP (last 3 results) No results for input(s): PROBNP in the last 8760 hours. HbA1C: Recent Labs    05/03/21 1523  HGBA1C 10.8*   CBG: Recent Labs  Lab 05/03/21 1656 05/04/21 0801 05/04/21 1226  GLUCAP 197* 236* 176*   Lipid Profile: Recent Labs    05/04/21 0528  CHOL 193  HDL 26*  LDLCALC 130*  TRIG 184*  CHOLHDL 7.4   Thyroid Function Tests: No results for input(s): TSH, T4TOTAL, FREET4, T3FREE, THYROIDAB in the last 72 hours. Anemia Panel: No results for input(s): VITAMINB12, FOLATE, FERRITIN, TIBC, IRON, RETICCTPCT in the last 72 hours. Sepsis Labs: Recent Labs  Lab 05/03/21 1523  PROCALCITON <0.10  LATICACIDVEN 1.7    Recent Results (from the past 240 hour(s))  SARS CORONAVIRUS 2 (TAT 6-24 HRS) Nasopharyngeal Nasopharyngeal Swab     Status: None   Collection Time: 05/03/21  3:23 PM   Specimen: Nasopharyngeal Swab  Result Value Ref Range Status   SARS Coronavirus 2 NEGATIVE NEGATIVE Final    Comment: (NOTE) SARS-CoV-2 target nucleic acids are NOT DETECTED.  The SARS-CoV-2 RNA is generally detectable in upper and lower respiratory specimens during the acute phase of infection. Negative results do not preclude SARS-CoV-2 infection, do not rule out co-infections with other pathogens, and should not be used as the sole basis for treatment or other patient management decisions. Negative results must be combined with clinical observations, patient history, and epidemiological information. The expected result  is Negative.  Fact Sheet for Patients: 05/05/21  Fact Sheet for Healthcare Providers: 05/05/21  This test is not yet approved or cleared by the [employer] FDA and  has been authorized for detection and/or diagnosis of SARS-CoV-2 by FDA under an Emergency Use Authorization (EUA). This EUA will remain  in effect (meaning this test can be used) for the duration of the COVID-19 declaration under Se ction 564(b)(1) of the Act, 21 U.S.C. section 360bbb-3(b)(1), unless the authorization is terminated or revoked sooner.  Performed at [facility], [address], [place], [address] [place]   CULTURE, BLOOD (ROUTINE X 2) w Reflex to ID Panel     Status: None (Preliminary result)   Collection Time: 05/03/21  3:23 PM   Specimen: BLOOD  Result Value Ref Range Status   Specimen Description BLOOD LEFT ANTECUBITAL  Final  Special Requests   Final    BOTTLES DRAWN AEROBIC AND ANAEROBIC Blood Culture adequate volume   Culture   Final    NO GROWTH < 24 HOURS Performed at [facility], [address] [place], [place] [address]    Report Status PENDING  Incomplete         Radiology Studies: DG Chest 2 View  Result Date: 05/03/2021 CLINICAL DATA:  Chest pain EXAM: CHEST - 2 VIEW COMPARISON:  10/18/2017 FINDINGS: The heart size and mediastinal contours are within normal limits. Mildly prominent interstitial markings bilaterally, most pronounced within the right lung base. No pleural effusion or pneumothorax. The visualized skeletal structures are unremarkable. IMPRESSION: Mildly prominent interstitial markings bilaterally, most pronounced within the right lung base. Findings may reflect bronchitic type lung changes versus developing atypical/viral infection. Electronically Signed   By: Duanne Guess D.O.   On: 05/03/2021 13:14   CARDIAC CATHETERIZATION  Result Date: 05/04/2021 Formatting of this result is  different from the original.   Mid Cx lesion is 60% stenosed.   Mid LAD lesion is 60% stenosed.   Dist LAD lesion is 30% stenosed.   Prox RCA lesion is 20% stenosed.   Prox RCA to Mid RCA lesion is 30% stenosed.   Dist RCA lesion is 100% stenosed.   A drug-eluting stent was successfully placed using a STENT RESOLUTE ONYX 2.5X26.   Post intervention, there is a 0% residual stenosis. 1.  Borderline three-vessel coronary artery disease.  The culprit for non-STEMI is an occluded distal right coronary artery.  In addition, there is moderate mid LAD and mid left circumflex disease estimated to be 60% each. 2.  Left ventricular angiography was not performed.  EF was low normal by echo. 3.  Moderately to severely elevated left ventricular end-diastolic pressure but this was after the patient received aggressive IV hydration precath. 4.  Successful angioplasty and drug-eluting stent placement to the right coronary artery. Recommendations: Dual antiplatelet therapy for at least 12 months. Aggressive treatment of risk factors. Possible discharge home tomorrow if no issues.   DG Chest Portable 1 View  Result Date: 05/04/2021 CLINICAL DATA:  40 year old female with chest pain EXAM: PORTABLE CHEST 1 VIEW COMPARISON:  chest radiograph dated 05/03/2021 and CT dated 05/03/2021 FINDINGS: No focal consolidation, pleural effusion, or pneumothorax. The cardiac silhouette is within normal limits. No acute osseous pathology. IMPRESSION: No active disease. Electronically Signed   By: Elgie Collard M.D.   On: 05/04/2021 02:36   CT ANGIO CHEST AORTA W/CM & OR WO/CM  Result Date: 05/03/2021 CLINICAL DATA:  Chest pain in the center hyper chest radiating to the back. EXAM: CT ANGIOGRAPHY CHEST WITH CONTRAST TECHNIQUE: Multidetector CT imaging of the chest was performed using the standard protocol during bolus administration of intravenous contrast. Multiplanar CT image reconstructions and MIPs were obtained to evaluate the vascular  anatomy. CONTRAST:  OMNIPAQUE IOHEXOL 350 MG/ML SOLN COMPARISON:  Two-view chest x-ray 05/03/2021. CT chest with contrast 01/06/2016 FINDINGS: Cardiovascular: Heart size is normal. Arch, great vessels, and descending thoracic aorta is normal. No dissection. Pulmonary arteries of normal size. Although the study was not optimized for detection of pulmonary emboli, no central filling defects evident. Mediastinum/Nodes: No enlarged mediastinal, hilar, or axillary lymph nodes. Thyroid gland, trachea, and esophagus demonstrate no significant findings. Lungs/Pleura: Lungs are clear. No nodule, mass, or airspace disease is present. No significant pleural disease is present. Upper Abdomen: Aorta is within normal limits to the level of the renal arteries. Upper  abdomen is unremarkable. Musculoskeletal: Sclerotic endplate changes are present within the thoracic spine at multiple levels. These are stable, associated with Schmorl's nodes. Vertebral body heights and alignment are normal. Review of the MIP images confirms the above findings. IMPRESSION: 1. No acute or focal lesion to explain the patient's symptoms. 2. 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 Electronically Signed   By: Marin Robertshristopher  Mattern M.D.   On: 05/03/2021 14:54        Scheduled Meds:  acetaminophen       [START ON 05/05/2021] aspirin  81 mg Oral Daily   atorvastatin  80 mg Oral Daily   [START ON 05/05/2021] enoxaparin (LOVENOX) injection  0.5 mg/kg Subcutaneous Q24H   famotidine  20 mg Oral BID   heparin  2,700 Units Intravenous Once   insulin aspart  0-15 Units Subcutaneous TID AC & HS   methylPREDNISolone (SOLU-MEDROL) injection  40 mg Intravenous Once   metoprolol tartrate  12.5 mg Oral BID   sodium chloride flush  3 mL Intravenous Q12H   sodium chloride flush  3 mL Intravenous Q12H   ticagrelor  90 mg Oral BID   Continuous Infusions:  sodium chloride 100 mL/hr at 05/04/21 0858   sodium chloride 75 mL/hr  at 05/04/21 1200   sodium chloride     heparin 1,200 Units/hr (05/04/21 0226)   nitroGLYCERIN Stopped (05/04/21 0525)     LOS: 0 days    Time spent: 28 minutes    Marrion Coyekui Taejah Ohalloran, MD [facility]   To contact the attending provider between 7A-7P or the covering provider during after hours 7P-7A, please log into the web site [email or web address] and access using universal [place] password for that web site. If you do not have the password, please call the hospital operator.  05/04/2021, 12:59 PM

## 2021-05-04 NOTE — H&P (Addendum)
[place]   PATIENT NAME: Holly Garner    MR#:  [id number]  DATE OF BIRTH:  14-Jan-1981  DATE OF ADMISSION:  05/04/2021  PRIMARY CARE PHYSICIAN: Lorre Munroe, NP   Patient is coming from: Home  REQUESTING/REFERRING PHYSICIAN: Nita Sickle, MD  CHIEF COMPLAINT:   Chief Complaint  Patient presents with  . Chest Pain    HISTORY OF PRESENT ILLNESS:  Holly Garner is a 40 y.o. obese Caucasian female with medical history significant for Type II diabetes mellitus, hypertension, obesity with BMI of 50.6, ongoing tobacco abuse, who presented to the emergency room with recurrent central chest pain graded 6/10 in severity and felt as a squeezing pain with no nausea or vomiting or diaphoresis when she went home this evening after leaving the ER AGAINST MEDICAL ADVICE after being diagnosed with non-STEMI.  She was seen yesterday for gripping like chest pain graded 8/10 in severity and associated with mild dyspnea with radiation to her back.  She had a right-sided chest wall pain from an abscess that was treated on an outpatient basis with doxycycline by her GYN doctor.  She was still having pain and erythema and was placed on IV Rocephin for.  No leg pain or edema or recent travels or surgeries.  No fever or chills.  No bleeding diathesis.  She denies any cough or wheezing or hemoptysis.  No dysuria, oliguria or hematuria or flank pain..  The patient apparently took prednisone when she went home thinking that that we will fix her pain when she has no improvement she came back to the ER.  ED Course: When she came back blood pressure was 147/80 with a respiratory rate of 23 and otherwise normal vital signs.  Labs revealed elevated troponin I of 2381 up from 689 last p.m., sodium of 132 blood glucose of 319 with otherwise unremarkable BMP.  CBC showed leukocytosis 16 with neutrophilia as well as thrombocytosis of 401.  Beta hCG was less than 1.  EKG as reviewed by me : Repeat EKG  showed about 1 mm ST segment elevation inferiorly with T wave inversion as well as T wave inversion in V3 through V6.  Another EKG later on showed maybe half millimeter ST segment elevation with T wave inversion inferiorly with otherwise no further changes. Imaging: Portable chest ray showed no acute cardiopulmonary disease.  The patient was given 4 of aspirin and 4 mg of IV morphine sulfate.  She will be admitted to a progressive unit bed for further evaluation and management. PAST MEDICAL HISTORY:   Past Medical History:  Diagnosis Date  . BMI 50.0-59.9, adult (HCC)   . History of Papanicolaou smear of cervix 04/2011   normal per pt.  . 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  PAST SURGICAL HISTORY:   Past Surgical History:  Procedure Laterality Date  . COLPOSCOPY  09/28/2009  . TONSILLECTOMY AND ADENOIDECTOMY    . TYMPANOSTOMY TUBE PLACEMENT Bilateral     SOCIAL HISTORY:   Social History   Tobacco Use  . Smoking status: Every Day    Packs/day: 1.00    Years: 10.00    Pack years: 10.00    Types: Cigarettes  . Smokeless tobacco: Never  Substance Use Topics  . Alcohol use: No    FAMILY HISTORY:   Family History  Problem Relation Age of Onset  . Breast  cancer Maternal Grandmother        late years    DRUG ALLERGIES:   Allergies  Allergen Reactions  . Malt   . Milk-Related Compounds   . Other Other (See Comments)  . Shellfish Allergy   . Soy Allergy     REVIEW OF SYSTEMS:   ROS As per history of present illness. All pertinent systems were reviewed above. Constitutional, HEENT, cardiovascular, respiratory, GI, GU, musculoskeletal, neuro, psychiatric, endocrine, integumentary and hematologic systems were reviewed and are otherwise negative/unremarkable except for positive findings mentioned above in the HPI.   MEDICATIONS AT HOME:   Prior to  Admission medications   Medication Sig Start Date End Date Taking? Authorizing Provider  doxycycline (VIBRAMYCIN) 100 MG capsule Take 1 capsule (100 mg total) by mouth 2 (two) times daily for 14 days. 04/21/21 05/05/21  Copland, Ilona Sorrel, PA-C  EPINEPHrine (EPIPEN 2-PAK) 0.3 mg/0.3 mL IJ SOAJ injection Inject 0.3 mLs (0.3 mg total) into the muscle once. 01/07/16   Gouru, Deanna Artis, MD  fluticasone (FLONASE) 50 MCG/ACT nasal spray Place 1 spray into both nostrils 2 (two) times daily. 11/30/20 12/30/20  [place], Betty G, MD  PROAIR HFA 108 [phone] Base) MCG/ACT inhaler Inhale 1-2 puffs into the lungs every 6 (six) hours as needed for wheezing or shortness of breath. 12/01/20   Lorre Munroe, NP      VITAL SIGNS:  Blood pressure (!) 147/80, pulse 99, temperature 97.6 F (36.4 C), temperature source Oral, resp. rate (!) 23, height 5\' 5"  (1.651 m), weight (!) 138.8 kg, last menstrual period 04/26/2021, SpO2 99 %.  PHYSICAL EXAMINATION:  Physical Exam  GENERAL:  40 y.o.-year-old obese Caucasian female patient lying in the bed with mild distress from pain. EYES: Pupils equal, round, reactive to light and accommodation. No scleral icterus. Extraocular muscles intact.  HEENT: Head atraumatic, normocephalic. Oropharynx and nasopharynx clear.  NECK:  Supple, no jugular venous distention. No thyroid enlargement, no tenderness.  LUNGS: Normal breath sounds bilaterally, no wheezing, rales,rhonchi or crepitation. No use of accessory muscles of respiration.  CARDIOVASCULAR: Regular rate and rhythm, S1, S2 normal. No murmurs, rubs, or gallops.  ABDOMEN: Soft, nondistended, nontender. Bowel sounds present. No organomegaly or mass.  EXTREMITIES: No pedal edema, cyanosis, or clubbing.  NEUROLOGIC: Cranial nerves II through XII are intact. Muscle strength 5/5 in all extremities. Sensation intact. Gait not checked.  PSYCHIATRIC: The patient is alert and oriented x 3.  Normal affect and good eye contact. SKIN: She was noted  earlier to have erythema and tenderness in her right inframammary area. LABORATORY PANEL:   CBC Recent Labs  Lab 05/04/21 0158  WBC 16.0*  HGB 13.2  HCT 41.3  PLT 401*   ------------------------------------------------------------------------------------------------------------------  Chemistries  Recent Labs  Lab 05/03/21 1223  NA 135  K 3.8  CL 97*  CO2 25  GLUCOSE 230*  BUN 10  CREATININE 0.53  CALCIUM 8.9   ------------------------------------------------------------------------------------------------------------------  Cardiac Enzymes No results for input(s): TROPONINI in the last 168 hours. ------------------------------------------------------------------------------------------------------------------  RADIOLOGY:  DG Chest 2 View  Result Date: 05/03/2021 CLINICAL DATA:  Chest pain EXAM: CHEST - 2 VIEW COMPARISON:  10/18/2017 FINDINGS: The heart size and mediastinal contours are within normal limits. Mildly prominent interstitial markings bilaterally, most pronounced within the right lung base. No pleural effusion or pneumothorax. The visualized skeletal structures are unremarkable. IMPRESSION: Mildly prominent interstitial markings bilaterally, most pronounced within the right lung base. Findings may reflect bronchitic type lung changes versus developing atypical/viral infection. Electronically Signed  By: Duanne Guess D.O.   On: 05/03/2021 13:14   DG Chest Portable 1 View  Result Date: 05/04/2021 CLINICAL DATA:  40 year old female with chest pain EXAM: PORTABLE CHEST 1 VIEW COMPARISON:  chest radiograph dated 05/03/2021 and CT dated 05/03/2021 FINDINGS: No focal consolidation, pleural effusion, or pneumothorax. The cardiac silhouette is within normal limits. No acute osseous pathology. IMPRESSION: No active disease. Electronically Signed   By: Elgie Collard M.D.   On: 05/04/2021 02:36   CT ANGIO CHEST AORTA W/CM & OR WO/CM  Result Date:  05/03/2021 CLINICAL DATA:  Chest pain in the center hyper chest radiating to the back. EXAM: CT ANGIOGRAPHY CHEST WITH CONTRAST TECHNIQUE: Multidetector CT imaging of the chest was performed using the standard protocol during bolus administration of intravenous contrast. Multiplanar CT image reconstructions and MIPs were obtained to evaluate the vascular anatomy. CONTRAST:  OMNIPAQUE IOHEXOL 350 MG/ML SOLN COMPARISON:  Two-view chest x-ray 05/03/2021. CT chest with contrast 01/06/2016 FINDINGS: Cardiovascular: Heart size is normal. Arch, great vessels, and descending thoracic aorta is normal. No dissection. Pulmonary arteries of normal size. Although the study was not optimized for detection of pulmonary emboli, no central filling defects evident. Mediastinum/Nodes: No enlarged mediastinal, hilar, or axillary lymph nodes. Thyroid gland, trachea, and esophagus demonstrate no significant findings. Lungs/Pleura: Lungs are clear. No nodule, mass, or airspace disease is present. No significant pleural disease is present. Upper Abdomen: Aorta is within normal limits to the level of the renal arteries. Upper abdomen is unremarkable. Musculoskeletal: Sclerotic endplate changes are present within the thoracic spine at multiple levels. These are stable, associated with Schmorl's nodes. Vertebral body heights and alignment are normal. Review of the MIP images confirms the above findings. IMPRESSION: 1. No acute or focal lesion to explain the patient's symptoms. 2. 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 Electronically Signed   By: Marin Roberts M.D.   On: 05/03/2021 14:54      IMPRESSION AND PLAN:  Active Problems:   NSTEMI (non-ST elevated myocardial infarction) (HCC)  1.  Non-ST elevation MI with dynamic EKG changes. - The patient will be admitted to a PCU bed. - We will continue her on IV heparin drip. - The patient was started on IV nitroglycerin drip but had significant  headache that was not relieved with Tylenol and dropped her systolic blood pressure to 90 so it was held off. - She was given as needed IV morphine sulfate and had nausea for which she will be given IV Zofran. - She will be placed on as needed sublingual nitroglycerin. - Cardiology follow-up consult will be obtained. - I notified Dr.Agbor-Etang about the patient. - We will continue her on high-dose statin and add small dose beta-blocker therapy. - We will continue to trend her troponin I's. - 2D echo will be obtained in a.m. - She will clearly need LHC sooner than later.  2.  Uncontrolled type 2 diabetes mellitus. - The patient will be placed on supplement coverage with NovoLog.  3.  Hyponatremia. - She will be hydrated with IV normal saline especially in the setting of hypotension and hyperglycemia.  4.  Right chest wall abscess with subsequent sepsis as manifested by tachypnea of 23, heart rate of 99 and leukocytosis of 16. - The patient will be placed on IV Rocephin and will follow blood cultures.  5.  Essential hypertension. - We will add Lopressor as mentioned above.  6.  Ongoing tobacco abuse. - She was counseled for smoking cessation  and will receive further counseling here.  DVT prophylaxis: IV heparin. Code Status: full code.  Family Communication:  The plan of care was discussed in details with the patient (and her husband who was with her in the room). I answered all questions. The patient agreed to proceed with the above mentioned plan. Further management will depend upon hospital course. Disposition Plan: Back to previous home environment Consults called: none cardiology All the records are reviewed and case discussed with ED provider.  Status is: Inpatient  Remains inpatient appropriate because:Ongoing active pain requiring inpatient pain management, Ongoing diagnostic testing needed not appropriate for outpatient work up, Unsafe d/c plan, IV treatments appropriate  due to intensity of illness or inability to take PO, and Inpatient level of care appropriate due to severity of illness  Dispo: The patient is from: Home              Anticipated d/c is to: Home              Patient currently is not medically stable to d/c.   Difficult to place patient No    TOTAL TIME TAKING CARE OF THIS PATIENT: 55 minutes.    Hannah BeatJan A Massiel Stipp M.D on 05/04/2021 at 2:46 AM  [facility]   From 7 PM-7 AM, contact night-coverage [email or web address]  CC: Primary care physician; Lorre MunroeBaity, Regina W, NP

## 2021-05-04 NOTE — Progress Notes (Signed)
Interval note:  Called patient yesterday at about 9pm as per admitting team request. Patient stated feeling well, with no cp and bp was normal and wanted to leave/go home. I recommended against leaving pending full cardiac workup.  Apparently, patient left AMA and later returned due to persistent cp and repeat troponins much elevated at 2381.  Restarted on heparin drip. Will add patient to rounding list since she fell off. Will plan for early evaluation in the am for possible LHC.   Discussed with admitting MD.  Signed, Debbe Odea, M.D. 05/04/21 [facility] [place], [place] [phone]

## 2021-05-04 NOTE — Progress Notes (Signed)
ANTICOAGULATION CONSULT NOTE  Pharmacy Consult for heparin infusion Indication: ACS/STEMI  Allergies  Allergen Reactions   Malt    Milk-Related Compounds    Other Other (See Comments)   Shellfish Allergy    Soy Allergy     Patient Measurements: Height: 5\' 5"  (165.1 cm) Weight: (!) 138.8 kg (306 lb) IBW/kg (Calculated) : 57 Heparin Dosing Weight: 91.5 kg  Vital Signs: Temp: 97.6 F (36.4 C) (07/14 0159) Temp Source: Oral (07/14 0159) BP: 147/80 (07/14 0159) Pulse Rate: 99 (07/14 0159)  Labs: Recent Labs    05/03/21 1223 05/03/21 1523 05/03/21 1833  HGB 14.3  --   --   HCT 44.3  --   --   PLT 430*  --   --   APTT  --  24  --   LABPROT  --  13.0  --   INR  --  1.0  --   CREATININE 0.53  --   --   TROPONINIHS 314* 427* 689*    Estimated Creatinine Clearance: 136.3 mL/min (by C-G formula based on SCr of 0.53 mg/dL).   Medical History: Past Medical History:  Diagnosis Date   BMI 50.0-59.9, adult (HCC)    History of Papanicolaou smear of cervix 04/2011   normal per pt.   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  Goal of Therapy:  Heparin level 0.3-0.7 units/ml Monitor platelets by anticoagulation protocol: Yes   Plan:  Give 4000 units bolus x 1 Start heparin infusion at 1200 units/hr Check anti-Xa level in 6 hours and daily while on heparin Continue to monitor H&H and platelets  08/20/2009, PharmD, [facility] 05/04/2021 2:11 AM

## 2021-05-04 NOTE — Progress Notes (Signed)
Inpatient Diabetes Program Recommendations  AACE/ADA: New Consensus Statement on Inpatient Glycemic Control (2015)  Target Ranges:  Prepandial:   less than 140 mg/dL      Peak postprandial:   less than 180 mg/dL (1-2 hours)      Critically ill patients:  140 - 180 mg/dL   Results for Holly Garner, Holly Garner (MRN [id number]) as of 05/04/2021 06:58  Ref. Range 05/03/2021 16:56  Glucose-Capillary Latest Ref Range: 70 - 99 mg/dL 250 (H)   Results for Holly Garner, Holly Garner (MRN [id number]) as of 05/04/2021 06:58  Ref. Range 07/23/2019 09:23 05/03/2021 15:23  Hemoglobin A1C Latest Ref Range: 4.8 - 5.6 % 8.5 (A) 10.8 (H)  (263 mg/dl)    Admit with: CP/ NSTEMI (left AMA yesterday PM 07/13 and back to ED early this AM with continued CP)  History: DM  Home DM Meds: none listed  Current Orders: Novolog Moderate Correction Scale/ SSI (0-15 units) TID AC + HS   PCP: [name], NP ([facility])  Note Novolog SSi to start this AM    MD- Current A1c of 10.8% shows poor glucose control at home.  Do not see that pt is taking any diabetes medications at home.  Will need either orals and/or insulin for better CBG control at home.  Pt has Medicaid coverage and has access to wide variety of med options.    --Will follow patient during hospitalization--  Ambrose Finland RN, MSN, CDE Diabetes Coordinator Inpatient Glycemic Control Team Team Pager: [phone] (8a-5p)

## 2021-05-04 NOTE — Interval H&P Note (Signed)
History and Physical Interval Note:  05/04/2021 11:05 AM  Holly Garner  has presented today for surgery, with the diagnosis of Non ST Elevation Myocardial infarction.  The various methods of treatment have been discussed with the patient and family. After consideration of risks, benefits and other options for treatment, the patient has consented to  Procedure(s): LEFT HEART CATH AND CORONARY ANGIOGRAPHY (N/A) as a surgical intervention.  The patient's history has been reviewed, patient examined, no change in status, stable for surgery.  I have reviewed the patient's chart and labs.  Questions were answered to the patient's satisfaction.     Lorine Bears

## 2021-05-04 NOTE — ED Notes (Signed)
Pt placed on 2L oxygen post pain medication administration.

## 2021-05-04 NOTE — ED Notes (Signed)
Lab called to state d-dimer was not able to be processed due to hemolyzation, lab requested to come draw ASAP due to pt needing to go to cath lab

## 2021-05-04 NOTE — ED Notes (Signed)
Pt calls out stating she has chest pain, pt given 1 PRN  SL nitro and BP dropped to 105/51 (65). Pt will be given PRN morphine for pain control at this time, PA aware, see MAR.   Pt also given cool wash cloth for comfort

## 2021-05-04 NOTE — ED Triage Notes (Signed)
Pt arrives with c/o chest pain. Pt recently left AMA from ED with plans for admission.

## 2021-05-05 ENCOUNTER — Telehealth: Payer: Self-pay | Admitting: Cardiovascular Disease

## 2021-05-05 ENCOUNTER — Encounter: Payer: Self-pay | Admitting: Cardiovascular Disease

## 2021-05-05 ENCOUNTER — Telehealth: Payer: Self-pay

## 2021-05-05 LAB — GLUCOSE, CAPILLARY: Glucose-Capillary: 283 mg/dL — ABNORMAL HIGH (ref 70–99)

## 2021-05-05 LAB — CBC
HCT: 40.1 % (ref 36.0–46.0)
Hemoglobin: 12.6 g/dL (ref 12.0–15.0)
MCH: 25.6 pg — ABNORMAL LOW (ref 26.0–34.0)
MCHC: 31.4 g/dL (ref 30.0–36.0)
MCV: 81.5 fL (ref 80.0–100.0)
Platelets: 378 10*3/uL (ref 150–400)
RBC: 4.92 MIL/uL (ref 3.87–5.11)
RDW: 14.6 % (ref 11.5–15.5)
WBC: 14.9 10*3/uL — ABNORMAL HIGH (ref 4.0–10.5)
nRBC: 0 % (ref 0.0–0.2)

## 2021-05-05 LAB — BASIC METABOLIC PANEL
Anion gap: 7 (ref 5–15)
BUN: 12 mg/dL (ref 6–20)
CO2: 25 mmol/L (ref 22–32)
Calcium: 9.7 mg/dL (ref 8.9–10.3)
Chloride: 103 mmol/L (ref 98–111)
Creatinine, Ser: 0.56 mg/dL (ref 0.44–1.00)
GFR, Estimated: >60 mL/min (ref >60)
Glucose, Bld: 298 mg/dL — ABNORMAL HIGH (ref 70–99)
Potassium: 3.9 mmol/L (ref 3.5–5.1)
Sodium: 135 mmol/L (ref 135–145)

## 2021-05-05 LAB — POCT ACTIVATED CLOTTING TIME: Activated Clotting Time: 231 seconds

## 2021-05-05 LAB — TROPONIN I (HIGH SENSITIVITY): Troponin I (High Sensitivity): 7355 ng/L (ref <18)

## 2021-05-05 MED ORDER — LOSARTAN POTASSIUM 25 MG PO TABS: 25.0000 mg | ORAL_TABLET | Freq: Every day | ORAL | 0 refills | Status: DC

## 2021-05-05 MED ORDER — NICOTINE 21 MG/24HR TD PT24
21.0000 mg | MEDICATED_PATCH | Freq: Every day | TRANSDERMAL | Status: DC
  Administered 2021-05-05: 21 mg via TRANSDERMAL
  Filled 2021-05-05: qty 1

## 2021-05-05 MED ORDER — LOSARTAN POTASSIUM 25 MG PO TABS: 25.0000 mg | ORAL_TABLET | Freq: Every day | ORAL | Status: DC

## 2021-05-05 MED ORDER — INSULIN ASPART 100 UNIT/ML FLEXPEN: 8.0000 [IU] | PEN_INJECTOR | Freq: Three times a day (TID) | SUBCUTANEOUS | 0 refills | Status: DC

## 2021-05-05 MED ORDER — ATROPINE SULFATE 0.4 MG/ML IJ SOLN
0.4000 mg | Freq: Once | INTRAMUSCULAR | Status: DC | PRN
  Filled 2021-05-05: qty 1

## 2021-05-05 MED ORDER — METOPROLOL SUCCINATE ER 25 MG PO TB24: 25.0000 mg | ORAL_TABLET | Freq: Every day | ORAL | Status: DC

## 2021-05-05 MED ORDER — TICAGRELOR 90 MG PO TABS: 90.0000 mg | ORAL_TABLET | Freq: Two times a day (BID) | ORAL | 0 refills | Status: DC

## 2021-05-05 MED ORDER — ATORVASTATIN CALCIUM 80 MG PO TABS: 80.0000 mg | ORAL_TABLET | Freq: Every day | ORAL | 0 refills | Status: DC

## 2021-05-05 MED ORDER — METOPROLOL SUCCINATE ER 25 MG PO TB24: 25.0000 mg | ORAL_TABLET | Freq: Every day | ORAL | 0 refills | Status: DC

## 2021-05-05 MED ORDER — INSULIN DETEMIR 100 UNIT/ML ~~LOC~~ SOLN: 20.0000 [IU] | Freq: Two times a day (BID) | SUBCUTANEOUS | 0 refills | Status: DC

## 2021-05-05 MED ORDER — BLOOD GLUCOSE MONITOR KIT
PACK | 0 refills | Status: DC
Start: 2021-05-05 — End: 2022-09-27

## 2021-05-05 MED ORDER — ASPIRIN 81 MG PO CHEW: 81.0000 mg | CHEWABLE_TABLET | Freq: Every day | ORAL | 0 refills | Status: AC

## 2021-05-05 NOTE — Progress Notes (Signed)
Brief Pharmacy Note  Pharmacy has been consulted for our meds to beds program.   Patient has declined to use the program, and is aware she will be responsible for her regular outpatient prescription copay.   The patient's preferred pharmacy in Epic has been not been changed.  Thank you for including pharmacy in this patient's care.  Raiford Noble, PharmD Clinical Pharmacist 05/05/2021 10:25 AM

## 2021-05-05 NOTE — Progress Notes (Signed)
Inpatient Diabetes Program Recommendations  AACE/ADA: New Consensus Statement on Inpatient Glycemic Control (2015)  Target Ranges:  Prepandial:   less than 140 mg/dL      Peak postprandial:   less than 180 mg/dL (1-2 hours)      Critically ill patients:  140 - 180 mg/dL   Results for Holly Garner, Holly Garner (MRN [id number]) as of 05/04/2021 06:58   Ref. Range 07/23/2019 09:23 05/03/2021 15:23  Hemoglobin A1C Latest Ref Range: 4.8 - 5.6 % 8.5 (A) 10.8 (H)   (263 mg/dl)   Admit with: CP/ NSTEMI (left AMA yesterday PM 07/13 and back to ED early this AM with continued CP)   History: DM   Home DM Meds: none listed   Current Orders: Novolog Moderate Correction Scale/ SSI (0-15 units) TID AC + HS      Levemir 10 units BID     Patient to d/c home today with new Rxs for Levemir and Novolog Insulins:  Levemir 20 units BID Novolog 8 units TID with meals  Met w/ pt and husband at bedside this AM.  Pt dressed and out of bed and ready to leave.  Husband stated he will help pt take insulin at home.    Educated patient and spouse on insulin pen use at home.  Reviewed all steps of insulin pen including attachment of needle, 2-unit air shot, dialing up dose, giving injection, rotation of injection sites, removing needle, disposal of sharps, storage of unused insulin, disposal of insulin etc.  Patient able to provide successful return demonstration.  Reviewed troubleshooting with insulin pen.  Also reviewed Signs/Symptoms of Hypoglycemia with patient and how to treat Hypoglycemia at home.    Also reviewed with pt the 2 different insulins she will be taking at home: Levemir and Novolog.  Reviewed what each is, how they work, when to take, important of consistency and timing of both insulins.  Also stressed to pt and husband the importance of making sure they have the correct insulin when giving the injection since she will be going home on 2 insulins.    Stressed to pt the importance of checking CBGs TID  before meals at home and to record all CBGs for PCP follow up.  Reviewed normal/healthy CBG values and A1c value for home.  Also stressed to pt the importance of calling PCP if she has issues with Hypoglycemia at home as she may need insulin reduction.    --Will follow patient during hospitalization--  Wyn Quaker RN, MSN, CDE Diabetes Coordinator Inpatient Glycemic Control Team Team Pager: [phone] (8a-5p)

## 2021-05-05 NOTE — Progress Notes (Signed)
Pt is ambulating in hall independently. Going off the unit to cafeteria with husband. Monitoring heart rate on telemetry. Instructed pt not to go outside. Will continue to monitor.

## 2021-05-05 NOTE — Telephone Encounter (Signed)
LMOV to schedule  

## 2021-05-05 NOTE — Telephone Encounter (Signed)
Holly Sheldon, RN with [facility] hospital, called to schedule patient for hospital follow up within 1 week. I scheduled with Dr Selena Batten based on availability and timing. Patient was seen Holly Garner and I was not able to find out if patient is staying with our office or following Holly Garner to the new office. Patient is still at the hospital. Not sure if Dr Selena Batten would want to have patient contacted prior to her appointment or discuss during her follow up. Please review. Thank you.

## 2021-05-05 NOTE — Telephone Encounter (Signed)
Please call patient to verify she plans to remain at our office. Would recommend follow-up with Rene Kocher at her new office if planning to follow her

## 2021-05-05 NOTE — Discharge Summary (Addendum)
Physician Discharge Summary  Patient ID: Holly Garner MRN: [id number] DOB/AGE: 1981/08/20 40 y.o.  Admit date: 05/04/2021 Discharge date: 05/05/2021  Admission Diagnoses:  Discharge Diagnoses:  Active Problems:   Morbid obesity ([place])   HTN (hypertension)   NSTEMI (non-ST elevated myocardial infarction) ([place])   Uncontrolled type 2 diabetes mellitus with hyperglycemia [facility])   Discharged Condition: good  Hospital Course:  Holly Garner is a 40 y.o. obese Caucasian female with medical history significant for Type II diabetes mellitus, hypertension, obesity with BMI of 50.6, ongoing tobacco abuse, who presented to the emergency room with recurrent central chest pain graded 6/10 in severity and felt as a squeezing pain with no nausea or vomiting.  Her peak troponin was 6327.  She had a coronary angiogram, was found to have nonocclusive coronary disease risk less than 60%.  But has distal RCA blockage.  She will be treated medically with dual antiplatelet treatment  #1.  Non-STEMI. Status post heart cath, no need for intervention.  Continue dual antiplatelet treatment and Lipitor. Patient has elevated troponin this morning at 7355, Dr. Rockey Situ is okay for discharge.   2.  Allergic reaction with skin redness. Patient has significant itching in her upper chest.  Skin was red in her neck, upper chest, bilateral arms.  Most likely this is due to IV contrast.  Condition has resolved today.   #3.  Uncontrolled type 2 diabetes with hyperglycemia. A1c more than 10.8.  We will give scheduled Levemir and novolog.  Based on glucose, Levemir was increased to 20 units twice a day in addition to scheduled NovoLog.  Patient will follow-up with PCP to adjust dose.  4.  Morbid obesity.  Consults: cardiology  Significant Diagnostic Studies:  Heart cath:   Mid Cx lesion is 60% stenosed.   Mid LAD lesion is 60% stenosed.   Dist LAD lesion is 30% stenosed.   Prox RCA lesion is 20% stenosed.    Prox RCA to Mid RCA lesion is 30% stenosed.   Dist RCA lesion is 100% stenosed.   A drug-eluting stent was successfully placed using a STENT RESOLUTE ONYX 2.5X26.   Post intervention, there is a 0% residual stenosis.   1.  Borderline three-vessel coronary artery disease.  The culprit for non-STEMI is an occluded distal right coronary artery.  In addition, there is moderate mid LAD and mid left circumflex disease estimated to be 60% each. 2.  Left ventricular angiography was not performed.  EF was low normal by echo. 3.  Moderately to severely elevated left ventricular end-diastolic pressure but this was after the patient received aggressive IV hydration precath. 4.  Successful angioplasty and drug-eluting stent placement to the right coronary artery.   Recommendations: Dual antiplatelet therapy for at least 12 months. Aggressive treatment of risk factors. Possible discharge home tomorrow if no issues.  Echo:  1. Left ventricular ejection fraction, by estimation, is 60 to 65%. The left ventricle has normal function. The left ventricle has no regional wall motion abnormalities. Left ventricular diastolic parameters were normal. 2. Right ventricular systolic function is normal. The right ventricular size is normal. 3. The mitral valve is normal in structure. No evidence of mitral valve regurgitation. No evidence of mitral stenosis.    Treatments: Heart cath, heparin, dual antiplatelet treatment.  Discharge Exam: Blood pressure (!) 130/99, pulse 68, temperature 97.7 F (36.5 C), temperature source Oral, resp. rate 20, height _0  (1.651 m), weight (!) 139.8 kg, last menstrual period 04/26/2021, SpO2 100 %. General  appearance: alert and cooperative Resp: clear to auscultation bilaterally Cardio: regular rate and rhythm, S1, S2 normal, no murmur, click, rub or gallop GI: soft, non-tender; bowel sounds normal; no masses,  no organomegaly Extremities: extremities normal, atraumatic, no  cyanosis or edema  Disposition: Discharge disposition: 01-Home or Self Care       Discharge Instructions     AMB Referral to Cardiac Rehabilitation - Phase II   Complete by: As directed    Diagnosis:  NSTEMI Coronary Stents     After initial evaluation and assessments completed: Virtual Based Care may be provided alone or in conjunction with Phase 2 Cardiac Rehab based on patient barriers.: Yes   Diet - low sodium heart healthy   Complete by: As directed    Increase activity slowly   Complete by: As directed       Allergies as of 05/05/2021       Reactions   Contrast Media [iodinated Diagnostic Agents] Itching   Malt    Milk-related Compounds    Other Other (See Comments)   Shellfish Allergy    Itching, red skin to upper body    Soy Allergy         Medication List     STOP taking these medications    doxycycline 100 MG capsule Commonly known as: VIBRAMYCIN       TAKE these medications    aspirin 81 MG chewable tablet Chew 1 tablet (81 mg total) by mouth daily. Start taking on: May 06, 2021   atorvastatin 80 MG tablet Commonly known as: LIPITOR Take 1 tablet (80 mg total) by mouth daily. Start taking on: May 06, 2021   blood glucose meter kit and supplies Kit Dispense based on patient and insurance preference. Use up to four times daily as directed.   EPINEPHrine 0.3 mg/0.3 mL Soaj injection Commonly known as: EpiPen 2-Pak Inject 0.3 mLs (0.3 mg total) into the muscle once.   fluticasone 50 MCG/ACT nasal spray Commonly known as: Flonase Place 1 spray into both nostrils 2 (two) times daily.   insulin aspart 100 UNIT/ML FlexPen Commonly known as: NOVOLOG Inject 8 Units into the skin 3 (three) times daily with meals.   insulin detemir 100 UNIT/ML injection Commonly known as: LEVEMIR Inject 0.2 mLs (20 Units total) into the skin 2 (two) times daily.   losartan 25 MG tablet Commonly known as: COZAAR Take 1 tablet (25 mg total) by mouth  daily.   metoprolol succinate 25 MG 24 hr tablet Commonly known as: TOPROL-XL Take 1 tablet (25 mg total) by mouth daily.   ProAir HFA 108 (90 Base) MCG/ACT inhaler Generic drug: albuterol Inhale 1-2 puffs into the lungs every 6 (six) hours as needed for wheezing or shortness of breath.   ticagrelor 90 MG Tabs tablet Commonly known as: BRILINTA Take 1 tablet (90 mg total) by mouth 2 (two) times daily.        Follow-up Information     Jearld Fenton, NP Follow up in 1 week(s).   Specialties: Internal Medicine, Emergency Medicine Contact information: [place] [address] [phone]         Kate Sable, MD Follow up in 2 week(s).   Specialties: Cardiology, Radiology Contact information: [place] [address] [phone]                35 minutes Signed: Sharen Hones 05/05/2021, 9:32 AM

## 2021-05-05 NOTE — Progress Notes (Signed)
Pt having some pulses, as well as brady to the 30s but not substained. She long pulse of 5.23 sinus arrest MD on called notified, order received to place a pacer pad on pt, and atropine prn. Staff will continue to monitor pt

## 2021-05-05 NOTE — Progress Notes (Signed)
Progress Note  Patient Name: Holly Garner Date of Encounter: 05/05/2021  [facility] Cardiologist: [facility] Evalynn Hankins  Subjective   Denies any chest pain, feels much better today walking around the floor Intact reports that she walked down to the cafeteria all around the hospital even when outside, no chest pain  Inpatient Medications    Scheduled Meds:  aspirin  81 mg Oral Daily   atorvastatin  80 mg Oral Daily   enoxaparin (LOVENOX) injection  0.5 mg/kg Subcutaneous Q24H   famotidine  20 mg Oral BID   insulin aspart  0-15 Units Subcutaneous TID AC & HS   insulin detemir  10 Units Subcutaneous BID   methylPREDNISolone (SOLU-MEDROL) injection  40 mg Intravenous Once   nicotine  21 mg Transdermal Daily   sodium chloride flush  3 mL Intravenous Q12H   sodium chloride flush  3 mL Intravenous Q12H   ticagrelor  90 mg Oral BID   Continuous Infusions:  sodium chloride 100 mL/hr at 05/04/21 0858   sodium chloride     nitroGLYCERIN Stopped (05/04/21 0525)   PRN Meds: sodium chloride, acetaminophen, albuterol, ALPRAZolam, atropine, diphenhydrAMINE, EPINEPHrine, fluticasone, magnesium hydroxide, morphine injection, nitroGLYCERIN, ondansetron (ZOFRAN) IV, sodium chloride flush, zolpidem   Vital Signs    Vitals:   05/04/21 1531 05/04/21 1931 05/05/21 0529 05/05/21 0838  BP: 113/62 125/69 140/85 (!) 130/99  Pulse: 70 73 72 68  Resp:  20 20   Temp: 98.7 F (37.1 C) 98.1 F (36.7 C) 97.7 F (36.5 C) 97.7 F (36.5 C)  TempSrc: Oral Oral Oral Oral  SpO2: 98% 96% 100% 100%  Weight: (!) 139.8 kg     Height:  (1.651 m)      No intake or output data in the 24 hours ending 05/05/21 0908 Last 3 Weights 05/04/2021 05/04/2021 05/04/2021  Weight (lbs) 308 lb 3.2 oz 306 lb 306 lb  Weight (kg) 139.799 kg 138.801 kg 138.801 kg      Telemetry    Normal sinus rhythm- Personally Reviewed  ECG    - Personally Reviewed  Physical Exam   GEN: No acute distress.  Obese Neck: No  JVD Cardiac: RRR, no murmurs, rubs, or gallops.  Respiratory: Clear to auscultation bilaterally. GI: Soft, nontender, non-distended  MS: No edema; No deformity. Neuro:  Nonfocal  Psych: Normal affect   Labs    High Sensitivity Troponin:   Recent Labs  Lab 05/03/21 1523 05/03/21 1833 05/04/21 0158 05/04/21 0528 05/04/21 0818  TROPONINIHS 427* 689* 2,381* 4,212* 6,327*      Chemistry Recent Labs  Lab 05/04/21 0158 05/04/21 0528 05/05/21 0516  NA 132* 135 135  K 4.5 4.3 3.9  CL 102 105 103  CO2 GLUCOSE 319* 273* 298*  BUN CREATININE 0.63 0.56 0.56  CALCIUM 8.7* 8.6* 9.7  GFRNONAA >60 >60 >60  ANIONGAP Hematology Recent Labs  Lab 05/04/21 0158 05/04/21 0528 05/05/21 0516  WBC 16.0* 15.0* 14.9*  RBC 5.02 4.64 4.92  HGB 13.2 12.1 12.6  HCT 41.3 38.5 40.1  MCV 82.3 83.0 81.5  MCH 26.3 26.1 25.6*  MCHC 32.0 31.4 31.4  RDW 14.6 14.6 14.6  PLT 401* 358 378    BNPNo results for input(s): BNP, PROBNP in the last 168 hours.   DDimer  Recent Labs  Lab 05/04/21 0906  DDIMER 0.52*     Radiology    DG Chest 2 View  Result Date: 05/03/2021 CLINICAL DATA:  Chest pain EXAM: CHEST - 2 VIEW COMPARISON:  10/18/2017 FINDINGS: The heart size and mediastinal contours are within normal limits. Mildly prominent interstitial markings bilaterally, most pronounced within the right lung base. No pleural effusion or pneumothorax. The visualized skeletal structures are unremarkable. IMPRESSION: Mildly prominent interstitial markings bilaterally, most pronounced within the right lung base. Findings may reflect bronchitic type lung changes versus developing atypical/viral infection. Electronically Signed   By: Duanne Guess D.O.   On: 05/03/2021 13:14   CARDIAC CATHETERIZATION  Result Date: 05/04/2021 Formatting of this result is different from the original.   Mid Cx lesion is 60% stenosed.   Mid LAD lesion is 60% stenosed.   Dist LAD lesion is 30%  stenosed.   Prox RCA lesion is 20% stenosed.   Prox RCA to Mid RCA lesion is 30% stenosed.   Dist RCA lesion is 100% stenosed.   A drug-eluting stent was successfully placed using a STENT RESOLUTE ONYX 2.5X26.   Post intervention, there is a 0% residual stenosis. 1.  Borderline three-vessel coronary artery disease.  The culprit for non-STEMI is an occluded distal right coronary artery.  In addition, there is moderate mid LAD and mid left circumflex disease estimated to be 60% each. 2.  Left ventricular angiography was not performed.  EF was low normal by echo. 3.  Moderately to severely elevated left ventricular end-diastolic pressure but this was after the patient received aggressive IV hydration precath. 4.  Successful angioplasty and drug-eluting stent placement to the right coronary artery. Recommendations: Dual antiplatelet therapy for at least 12 months. Aggressive treatment of risk factors. Possible discharge home tomorrow if no issues.   DG Chest Portable 1 View  Result Date: 05/04/2021 CLINICAL DATA:  40 year old female with chest pain EXAM: PORTABLE CHEST 1 VIEW COMPARISON:  chest radiograph dated 05/03/2021 and CT dated 05/03/2021 FINDINGS: No focal consolidation, pleural effusion, or pneumothorax. The cardiac silhouette is within normal limits. No acute osseous pathology. IMPRESSION: No active disease. Electronically Signed   By: Elgie Collard M.D.   On: 05/04/2021 02:36   CT ANGIO CHEST AORTA W/CM & OR WO/CM  Result Date: 05/03/2021 CLINICAL DATA:  Chest pain in the center hyper chest radiating to the back. EXAM: CT ANGIOGRAPHY CHEST WITH CONTRAST TECHNIQUE: Multidetector CT imaging of the chest was performed using the standard protocol during bolus administration of intravenous contrast. Multiplanar CT image reconstructions and MIPs were obtained to evaluate the vascular anatomy. CONTRAST:  OMNIPAQUE IOHEXOL 350 MG/ML SOLN COMPARISON:  Two-view chest x-ray 05/03/2021. CT chest with  contrast 01/06/2016 FINDINGS: Cardiovascular: Heart size is normal. Arch, great vessels, and descending thoracic aorta is normal. No dissection. Pulmonary arteries of normal size. Although the study was not optimized for detection of pulmonary emboli, no central filling defects evident. Mediastinum/Nodes: No enlarged mediastinal, hilar, or axillary lymph nodes. Thyroid gland, trachea, and esophagus demonstrate no significant findings. Lungs/Pleura: Lungs are clear. No nodule, mass, or airspace disease is present. No significant pleural disease is present. Upper Abdomen: Aorta is within normal limits to the level of the renal arteries. Upper abdomen is unremarkable. Musculoskeletal: Sclerotic endplate changes are present within the thoracic spine at multiple levels. These are stable, associated with Schmorl's nodes. Vertebral body heights and alignment are normal. Review of the MIP images confirms the above findings. IMPRESSION: 1. No acute or focal lesion to explain the patient's symptoms. 2. 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 Electronically Signed   By:  Marin Robertshristopher  Mattern M.D.   On: 05/03/2021 14:54   ECHOCARDIOGRAM COMPLETE  Result Date: 05/04/2021    ECHOCARDIOGRAM REPORT   Patient Name:   Holly MarkerMANDA R Garner Date of Exam: 05/04/2021 Medical Rec #:  [id number]         Height:       65.0 in Accession #:    [id number]        Weight:       306.0 lb Date of Birth:  05/08/1981         BSA:          2.370 m Patient Age:    39 years          BP:           113/60 mmHg Patient Gender: F                 HR:           80 bpm. Exam Location:  [facility] Procedure: 2D Echo, Cardiac Doppler and Color Doppler Indications:     NSTEMI I21.4  History:         Patient has no prior history of Echocardiogram examinations.                  Risk Factors:Hypertension and Diabetes. Tobacco abuse, NSTEMI.  Sonographer:     Cristela BlueJerry Hege RDCS (AE) Referring Phys:  [id number] Lennon AlstromJACQUELYN D VISSER Diagnosing Phys: Julien Nordmannimothy  Ethaniel Garfield MD  Sonographer Comments: Suboptimal apical window and Technically challenging study due to limited acoustic windows. IMPRESSIONS  1. Left ventricular ejection fraction, by estimation, is 60 to 65%. The left ventricle has normal function. The left ventricle has no regional wall motion abnormalities. Left ventricular diastolic parameters were normal.  2. Right ventricular systolic function is normal. The right ventricular size is normal.  3. The mitral valve is normal in structure. No evidence of mitral valve regurgitation. No evidence of mitral stenosis. FINDINGS  Left Ventricle: Left ventricular ejection fraction, by estimation, is 60 to 65%. The left ventricle has normal function. The left ventricle has no regional wall motion abnormalities. The left ventricular internal cavity size was normal in size. There is  no left ventricular hypertrophy. Left ventricular diastolic parameters were normal. Right Ventricle: The right ventricular size is normal. No increase in right ventricular wall thickness. Right ventricular systolic function is normal. Left Atrium: Left atrial size was normal in size. Right Atrium: Right atrial size was normal in size. Pericardium: There is no evidence of pericardial effusion. Mitral Valve: The mitral valve is normal in structure. No evidence of mitral valve regurgitation. No evidence of mitral valve stenosis. Tricuspid Valve: The tricuspid valve is normal in structure. Tricuspid valve regurgitation is not demonstrated. No evidence of tricuspid stenosis. Aortic Valve: The aortic valve is normal in structure. Aortic valve regurgitation is not visualized. No aortic stenosis is present. Aortic valve mean gradient measures 2.0 mmHg. Aortic valve peak gradient measures 3.7 mmHg. Aortic valve area, by VTI measures 3.40 cm. Pulmonic Valve: The pulmonic valve was normal in structure. Pulmonic valve regurgitation is not visualized. No evidence of pulmonic stenosis. Aorta: The aortic root is  normal in size and structure. Venous: The inferior vena cava is normal in size with greater than 50% respiratory variability, suggesting right atrial pressure of 3 mmHg. IAS/Shunts: No atrial level shunt detected by color flow Doppler.  LEFT VENTRICLE PLAX 2D LVIDd:         4.92 cm  Diastology LVIDs:  3.05 cm  LV e' medial:    6.85 cm/s LV PW:         1.08 cm  LV E/e' medial:  15.5 LV IVS:        0.85 cm  LV e' lateral:   9.25 cm/s LVOT diam:     2.00 cm  LV E/e' lateral: 11.5 LV SV:         58 LV SV Index:   24 LVOT Area:     3.14 cm  RIGHT VENTRICLE RV Basal diam:  3.29 cm RV S prime:     16.20 cm/s TAPSE (M-mode): 4.2 cm LEFT ATRIUM           Index      RIGHT ATRIUM           Index LA diam:      4.50 cm 1.90 cm/m RA Area:     14.60 cm LA Vol (A4C): 22.0 ml 9.28 ml/m RA Volume:   38.80 ml  16.37 ml/m  AORTIC VALVE                   PULMONIC VALVE AV Area (Vmax):    3.03 cm    PV Vmax:        0.33 m/s AV Area (Vmean):   2.92 cm    PV Peak grad:   0.4 mmHg AV Area (VTI):     3.40 cm    RVOT Peak grad: 1 mmHg AV Vmax:           96.10 cm/s AV Vmean:          68.400 cm/s AV VTI:            0.170 m AV Peak Grad:      3.7 mmHg AV Mean Grad:      2.0 mmHg LVOT Vmax:         92.70 cm/s LVOT Vmean:        63.500 cm/s LVOT VTI:          0.184 m LVOT/AV VTI ratio: 1.08  AORTA Ao Root diam: 3.10 cm MITRAL VALVE                TRICUSPID VALVE MV Area (PHT): 3.36 cm     TR Peak grad:   21.5 mmHg MV Decel Time: 226 msec     TR Vmax:        232.00 cm/s MV E velocity: 106.00 cm/s MV A velocity: 64.70 cm/s   SHUNTS MV E/A ratio:  1.64         Systemic VTI:  0.18 m                             Systemic Diam: 2.00 cm Julien Nordmann MD Electronically signed by Julien Nordmann MD Signature Date/Time: 05/04/2021/1:15:54 PM    Final     Cardiac Studies  Cardiac catheterization performed May 05, 2019 Mid Cx lesion is 60% stenosed.   Mid LAD lesion is 60% stenosed.   Dist LAD lesion is 30% stenosed.   Prox RCA lesion is  20% stenosed.   Prox RCA to Mid RCA lesion is 30% stenosed.   Dist RCA lesion is 100% stenosed.   A drug-eluting stent was successfully placed using a STENT RESOLUTE ONYX 2.5X26.   Post intervention, there is a 0% residual stenosis.   1.  Borderline three-vessel coronary artery disease.  The culprit for non-STEMI is an  occluded distal right coronary artery.  In addition, there is moderate mid LAD and mid left circumflex disease estimated to be 60% each. 2.  Left ventricular angiography was not performed.  EF was low normal by echo. 3.  Moderately to severely elevated left ventricular end-diastolic pressure but this was after the patient received aggressive IV hydration precath. 4.  Successful angioplasty and drug-eluting stent placement to the right coronary artery.   Recommendations: Dual antiplatelet therapy for at least 12 months. Aggressive treatment of risk factors. Possible discharge home tomorrow if no issues.   Patient Profile     40 y.o. female with long history of poorly controlled diabetes, morbid obesity, hyperlipidemia, long smoking history presenting with unstable angina, non-STEMI, troponin 6000 Catheterization yesterday  Assessment & Plan   NSTEMI Catheterization results discussed with her, Stent placed to distal RCA Medical management of 60% LAD disease 60% left circumflex disease Stressed importance of taking her medications including aspirin and Brilinta Discussed statin, beta-blocker, losartan Long discussion concerning dietary changes, need for more aggressive diabetes control, smoking cessation techniques -Recommend close follow-up in clinic  Smoking We have encouraged her to continue to work on weaning her cigarettes and smoking cessation. She will continue to work on this and does not want any assistance with chantix.    Morbid obesity We have encouraged continued exercise, careful diet management in an effort to lose weight.  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

## 2021-05-05 NOTE — Telephone Encounter (Signed)
-----   Message from Antonieta Iba, MD sent at 05/05/2021  9:18 AM EDT ----- Regarding: hosp d/c f/u appt Leaving hospital NSTEMI Needs f/u gollan or APP Within 2 weeks thx

## 2021-05-05 NOTE — Telephone Encounter (Signed)
Please see note. Not able to address on the phone. Patient is still at the hospital right now

## 2021-05-07 ENCOUNTER — Emergency Department
Admission: EM | Admit: 2021-05-07 | Discharge: 2021-05-07 | Disposition: A | Discharge status: Home / Self Care | Payer: Medicaid Other | Attending: Emergency Medicine | Admitting: Emergency Medicine

## 2021-05-07 ENCOUNTER — Other Ambulatory Visit: Payer: Self-pay

## 2021-05-07 DIAGNOSIS — Z9861 Coronary angioplasty status: Secondary | ICD-10-CM | POA: Diagnosis not present

## 2021-05-07 DIAGNOSIS — E119 Type 2 diabetes mellitus without complications: Secondary | ICD-10-CM | POA: Insufficient documentation

## 2021-05-07 DIAGNOSIS — Z79899 Other long term (current) drug therapy: Secondary | ICD-10-CM | POA: Insufficient documentation

## 2021-05-07 DIAGNOSIS — I10 Essential (primary) hypertension: Secondary | ICD-10-CM | POA: Diagnosis not present

## 2021-05-07 DIAGNOSIS — Z794 Long term (current) use of insulin: Secondary | ICD-10-CM | POA: Insufficient documentation

## 2021-05-07 DIAGNOSIS — F17210 Nicotine dependence, cigarettes, uncomplicated: Secondary | ICD-10-CM | POA: Insufficient documentation

## 2021-05-07 DIAGNOSIS — Z7982 Long term (current) use of aspirin: Secondary | ICD-10-CM | POA: Diagnosis not present

## 2021-05-07 DIAGNOSIS — L0291 Cutaneous abscess, unspecified: Secondary | ICD-10-CM

## 2021-05-07 DIAGNOSIS — L02211 Cutaneous abscess of abdominal wall: Secondary | ICD-10-CM | POA: Insufficient documentation

## 2021-05-07 MED ORDER — DIPHENHYDRAMINE HCL 25 MG PO CAPS
50.0000 mg | ORAL_CAPSULE | Freq: Once | ORAL | Status: AC
  Administered 2021-05-07: 50 mg via ORAL
  Filled 2021-05-07: qty 2

## 2021-05-07 MED ORDER — FENTANYL CITRATE (PF) 100 MCG/2ML IJ SOLN
50.0000 ug | Freq: Once | INTRAMUSCULAR | Status: AC
  Administered 2021-05-07: 50 ug via INTRAMUSCULAR
  Filled 2021-05-07: qty 2

## 2021-05-07 MED ORDER — FAMOTIDINE 20 MG PO TABS
40.0000 mg | ORAL_TABLET | Freq: Once | ORAL | Status: AC
  Administered 2021-05-07: 40 mg via ORAL
  Filled 2021-05-07: qty 2

## 2021-05-07 MED ORDER — DOXYCYCLINE HYCLATE 100 MG PO TBEC: 100.0000 mg | DELAYED_RELEASE_TABLET | Freq: Two times a day (BID) | ORAL | 0 refills | Status: AC

## 2021-05-07 MED ORDER — ACETAMINOPHEN 325 MG PO TABS
650.0000 mg | ORAL_TABLET | Freq: Once | ORAL | Status: AC
  Administered 2021-05-07: 650 mg via ORAL
  Filled 2021-05-07: qty 2

## 2021-05-07 MED ORDER — FLUCONAZOLE 150 MG PO TABS: 150.0000 mg | ORAL_TABLET | Freq: Every day | ORAL | 0 refills | Status: DC

## 2021-05-07 MED ORDER — LIDOCAINE HCL 1 % IJ SOLN
5.0000 mL | Freq: Once | INTRAMUSCULAR | Status: AC
  Administered 2021-05-07: 5 mL
  Filled 2021-05-07: qty 10

## 2021-05-07 NOTE — ED Triage Notes (Signed)
Pt comes with c/o abscess noted under right breast and rash noted over arms and chest. Pt states stent placed last week. Pt states rash noticed after that procedure.

## 2021-05-07 NOTE — ED Provider Notes (Signed)
[facility]-EMERGENCY DEPARTMENT  ____________________________________________  Time seen: Approximately 5:24 PM  I have reviewed the triage vital signs and the nursing notes.   HISTORY  Chief Complaint Abscess and Rash   Historian Patient     HPI Holly Garner is a 40 y.o. female presents to the emergency department with multiple medical concerns.  Patient has a targetoid rash along upper extremities and trunk.  Patient is also concerned about a abscess along the right upper abdominal wall and inferior aspect of the right breast.  No fever or chills.  Patient has been recently started on several antihypertensive medications and Brilinta.  Patient is also started 81 mg of aspirin.  She denies chest pain, chest tightness or shortness of breath.  Patient does feel like her lips seem more edematous than usual.  No difficulty swallowing.  Patient is speaking in complete sentences and managing her own secretions.   Past Medical History:  Diagnosis Date   BMI 50.0-59.9, adult ([place])    History of Papanicolaou smear of cervix 04/2011   normal per pt.   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     Past Medical History:  Diagnosis Date   BMI 50.0-59.9, adult ([place])    History of Papanicolaou smear of cervix 04/2011   normal per pt.   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   hgsil    Patient Active Problem List   Diagnosis Date Noted   Uncontrolled type 2 diabetes mellitus with hyperglycemia ([place]) 05/04/2021   Chest pain 05/03/2021   Cellulitis of chest wall 05/03/2021   NSTEMI (non-ST elevated myocardial infarction) ([place]) 05/03/2021   Sepsis ([place]) 05/03/2021   Tobacco abuse 05/03/2021   Pure hypercholesterolemia    Diabetes mellitus without complication ([place]) 86/16/8372   HTN (hypertension) 04/07/2019   Swelling 03/24/2019   Morbid obesity ([place]) 03/24/2019   Seasonal allergies 03/24/2019    Past  Surgical History:  Procedure Laterality Date   COLPOSCOPY  09/28/2009   CORONARY STENT INTERVENTION N/A 05/04/2021   Procedure: CORONARY STENT INTERVENTION;  Surgeon: Wellington Hampshire, MD;  Location: [facility];  Service: Cardiovascular;  Laterality: N/A;   LEFT HEART CATH AND CORONARY ANGIOGRAPHY N/A 05/04/2021   Procedure: LEFT HEART CATH AND CORONARY ANGIOGRAPHY;  Surgeon: Wellington Hampshire, MD;  Location: [facility];  Service: Cardiovascular;  Laterality: N/A;   TONSILLECTOMY AND ADENOIDECTOMY     TYMPANOSTOMY TUBE PLACEMENT Bilateral     Prior to Admission medications   Medication Sig Start Date End Date Taking? Authorizing Provider  doxycycline (DORYX) 100 MG EC tablet Take 1 tablet (100 mg total) by mouth 2 (two) times daily for 7 days. 05/07/21 05/14/21 Yes Vallarie Mare M, PA-C  fluconazole (DIFLUCAN) 150 MG tablet Take 1 tablet (150 mg total) by mouth daily. 05/07/21  Yes Vallarie Mare M, PA-C  aspirin 81 MG chewable tablet Chew 1 tablet (81 mg total) by mouth daily. 05/06/21   Sharen Hones, MD  atorvastatin (LIPITOR) 80 MG tablet Take 1 tablet (80 mg total) by mouth daily. 05/06/21   Sharen Hones, MD  blood glucose meter kit and supplies KIT Dispense based on patient and insurance preference. Use up to four times daily as directed. 05/05/21   Sharen Hones, MD  EPINEPHrine (EPIPEN 2-PAK) 0.3 mg/0.3 mL IJ SOAJ injection Inject 0.3 mLs (0.3 mg total) into the muscle once. 01/07/16   Gouru, Illene Silver,  MD  fluticasone (FLONASE) 50 MCG/ACT nasal spray Place 1 spray into both nostrils 2 (two) times daily. 11/30/20 12/30/20  [place], Betty G, MD  insulin aspart (NOVOLOG) 100 UNIT/ML FlexPen Inject 8 Units into the skin 3 (three) times daily with meals. 05/05/21   Sharen Hones, MD  insulin detemir (LEVEMIR) 100 UNIT/ML injection Inject 0.2 mLs (20 Units total) into the skin 2 (two) times daily. 05/05/21   Sharen Hones, MD  losartan (COZAAR) 25 MG tablet Take 1 tablet (25 mg total) by mouth  daily. 05/05/21   Sharen Hones, MD  metoprolol succinate (TOPROL-XL) 25 MG 24 hr tablet Take 1 tablet (25 mg total) by mouth daily. 05/05/21   Sharen Hones, MD  PROAIR HFA 108 [phone] Base) MCG/ACT inhaler Inhale 1-2 puffs into the lungs every 6 (six) hours as needed for wheezing or shortness of breath. 12/01/20   Jearld Fenton, NP  ticagrelor (BRILINTA) 90 MG TABS tablet Take 1 tablet (90 mg total) by mouth 2 (two) times daily. 05/05/21   Sharen Hones, MD    Allergies Contrast media [iodinated diagnostic agents], Malt, Milk-related compounds, Other, Shellfish allergy, and Soy allergy  Family History  Problem Relation Age of Onset   Breast cancer Maternal Grandmother        late years    Social History Social History   Tobacco Use   Smoking status: Every Day    Packs/day: 1.00    Years: 10.00    Pack years: 10.00    Types: Cigarettes   Smokeless tobacco: Never  Vaping Use   Vaping Use: Never used  Substance Use Topics   Alcohol use: No   Drug use: No     Review of Systems  Constitutional: No fever/chills Eyes:  No discharge ENT: No upper respiratory complaints. Respiratory: no cough. No SOB/ use of accessory muscles to breath Gastrointestinal:   No nausea, no vomiting.  No diarrhea.  No constipation. Musculoskeletal: Negative for musculoskeletal pain. Skin: Patient has rash and abscess.    ____________________________________________   PHYSICAL EXAM:  VITAL SIGNS: ED Triage Vitals  Enc Vitals Group     BP 05/07/21 1613 (!) 159/92     Pulse Rate 05/07/21 1613 92     Resp 05/07/21 1613 18     Temp 05/07/21 1613 98 F (36.7 C)     Temp src --      SpO2 05/07/21 1613 100 %     Weight --      Height --      Head Circumference --      Peak Flow --      Pain Score 05/07/21 1612 10     Pain Loc --      Pain Edu? --      Excl. in [place]? --      Constitutional: Alert and oriented. Well appearing and in no acute distress. Eyes: Conjunctivae are normal. PERRL.  EOMI. Head: Atraumatic. ENT:      Nose: No congestion/rhinnorhea.      Mouth/Throat: Mucous membranes are moist.  Neck: No stridor.  No cervical spine tenderness to palpation. Cardiovascular: Normal rate, regular rhythm. Normal S1 and S2.  Good peripheral circulation. Respiratory: Normal respiratory effort without tachypnea or retractions. Lungs CTAB. Good air entry to the bases with no decreased or absent breath sounds Gastrointestinal: Bowel sounds x 4 quadrants. Soft and nontender to palpation. No guarding or rigidity. No distention. Musculoskeletal: Full range of motion to all extremities. No obvious deformities noted Neurologic:  Normal for age. No gross focal neurologic deficits are appreciated.  Skin: Patient has targetoid rash of upper extremities and chest with a 4 cm x 4 cm abscess along right upper abdomen that extends to left breast. Psychiatric: Mood and affect are normal for age. Speech and behavior are normal.   ____________________________________________   LABS (all labs ordered are listed, but only abnormal results are displayed)  Labs Reviewed - No data to display ____________________________________________  EKG   ____________________________________________  RADIOLOGY Unk Pinto, personally viewed and evaluated these images (plain radiographs) as part of my medical decision making, as well as reviewing the written report by the radiologist.    No results found.  ____________________________________________    PROCEDURES  Procedure(s) performed:     Marland KitchenMarland KitchenIncision and Drainage  Date/Time: 05/07/2021 7:34 PM Performed by: Lannie Fields, PA-C Authorized by: Lannie Fields, PA-C   Consent:    Consent obtained:  Verbal   Risks discussed:  Bleeding and incomplete drainage Universal protocol:    Patient identity confirmed:  Verbally with patient Location:    Type:  Abscess   Location:  Trunk   Trunk location:  Abdomen Sedation:     Sedation type:  Anxiolysis Anesthesia:    Anesthesia method:  Local infiltration   Local anesthetic:  Lidocaine 1% w/o epi Procedure type:    Complexity:  Complex Procedure details:    Incision types:  Single straight   Incision depth:  Submucosal   Wound management:  Probed and deloculated   Drainage:  Purulent   Drainage amount:  Copious   Packing materials:  1/4 in iodoform gauze   Amount 1/4" iodoform:  20 cm Post-procedure details:    Procedure completion:  Tolerated well, no immediate complications     Medications  fentaNYL (SUBLIMAZE) injection 50 mcg (50 mcg Intramuscular Given 05/07/21 1728)  lidocaine (XYLOCAINE) 1 % (with pres) injection 5 mL (5 mLs Infiltration Given 05/07/21 1741)  acetaminophen (TYLENOL) tablet 650 mg (650 mg Oral Given 05/07/21 1816)  diphenhydrAMINE (BENADRYL) capsule 50 mg (50 mg Oral Given 05/07/21 1815)  famotidine (PEPCID) tablet 40 mg (40 mg Oral Given 05/07/21 1816)     ____________________________________________   INITIAL IMPRESSION / ASSESSMENT AND PLAN / ED COURSE  Pertinent labs & imaging results that were available during my care of the patient were reviewed by me and considered in my medical decision making (see chart for details).      Assessment and Plan:  Rash Abscess 39 year old female presents to the emergency department with multiple medical concerns.  Patient has had a targetoid rash along the upper extremities and chest and also has an abscess along the superior aspect of the right abdomen.  Patient was hypertensive at triage but vital signs were otherwise reassuring.  On exam, patient had 4 cm x 4 cm abscess that drained copiously with incision and drainage.  Packing was placed and patient was advised to remove packing in 3 days.  She was restarted on doxycycline twice daily for the next 7 days.  Advised patient to hold her aspirin as I suspect that erythema multiforme is secondary to aspirin use.  Recommended that patient  brief her cardiologist regarding changes in care.  If patient's rash continues to persist, recommended follow-up with cardiology for possible medication adjustment.  Recommended compliance with other medications per cardiology.  Did recommend 50 mg of Benadryl every 8 hours and 40 mg of Pepcid once daily.  Patient's rash had improved by 75% with aforementioned  medications in the emergency department.  Return precautions were given to return to the emergency department with new or worsening symptoms.  All patient questions were answered.    ____________________________________________  FINAL CLINICAL IMPRESSION(S) / ED DIAGNOSES  Final diagnoses:  Abscess      NEW MEDICATIONS STARTED DURING THIS VISIT:  ED Discharge Orders          Ordered    doxycycline (DORYX) 100 MG EC tablet  2 times daily        05/07/21 1913    fluconazole (DIFLUCAN) 150 MG tablet  Daily        05/07/21 1938                This chart was dictated using voice recognition software/Dragon. Despite best efforts to proofread, errors can occur which can change the meaning. Any change was purely unintentional.     Karren Cobble 05/07/21 Lattie Corns    Lavonia Drafts, MD 05/07/21 2115

## 2021-05-07 NOTE — Discharge Instructions (Addendum)
Take 50 mg of Benadryl every 8 hours. Take 40 mg of Pepcid once daily. Hold aspirin for now and let cardiologist know that he might be having an allergic reaction from it. If rash persist, please let cardiologist know that you are having side effects from the medications. Packing needs to be removed in 3 days.

## 2021-05-08 ENCOUNTER — Telehealth: Payer: Self-pay

## 2021-05-08 LAB — CULTURE, BLOOD (ROUTINE X 2)
Culture: NO GROWTH
Special Requests: ADEQUATE

## 2021-05-08 NOTE — Telephone Encounter (Addendum)
Pt said was seen at [facility] ED on 05/07/21 with I&D of abscess. Pt said since d/c from ED pt has changed saturated with blood dressing 3 - 4 times. No available appts at [facility] and advised pt to return to [facility] ED since bleeding and on Brilinta. Pt said she had contacted card and was advised to call PCP. Pt said she will call her other doctor at [facility] practice to see if they can see her and if not advised pt should go to ED for eval. Pt voiced understanding.sending note to DR Selena Batten who has appt with pt on 05/11/21 for FU.

## 2021-05-08 NOTE — Telephone Encounter (Signed)
Agree with re-evaluation in the ER

## 2021-05-10 DIAGNOSIS — L0291 Cutaneous abscess, unspecified: Secondary | ICD-10-CM | POA: Diagnosis not present

## 2021-05-11 ENCOUNTER — Ambulatory Visit: Payer: Medicaid Other | Admitting: Family Medicine

## 2021-05-11 ENCOUNTER — Encounter: Payer: Self-pay | Admitting: Family Medicine

## 2021-05-11 ENCOUNTER — Other Ambulatory Visit: Payer: Self-pay

## 2021-05-11 VITALS — BP 120/58 | HR 95 | Temp 98.3°F | Ht 65.0 in | Wt 299.0 lb

## 2021-05-11 DIAGNOSIS — I252 Old myocardial infarction: Secondary | ICD-10-CM | POA: Diagnosis not present

## 2021-05-11 DIAGNOSIS — E1169 Type 2 diabetes mellitus with other specified complication: Secondary | ICD-10-CM

## 2021-05-11 DIAGNOSIS — E119 Type 2 diabetes mellitus without complications: Secondary | ICD-10-CM | POA: Diagnosis not present

## 2021-05-11 DIAGNOSIS — L03313 Cellulitis of chest wall: Secondary | ICD-10-CM | POA: Diagnosis not present

## 2021-05-11 DIAGNOSIS — E7800 Pure hypercholesterolemia, unspecified: Secondary | ICD-10-CM | POA: Diagnosis not present

## 2021-05-11 MED ORDER — METOPROLOL SUCCINATE ER 25 MG PO TB24: 25.0000 mg | ORAL_TABLET | Freq: Every day | ORAL | 0 refills | Status: DC

## 2021-05-11 MED ORDER — INSULIN DETEMIR 100 UNIT/ML ~~LOC~~ SOLN
20.0000 [IU] | Freq: Two times a day (BID) | SUBCUTANEOUS | 0 refills | Status: DC
Start: 2021-05-11 — End: 2021-05-30

## 2021-05-11 MED ORDER — ATORVASTATIN CALCIUM 80 MG PO TABS: 80.0000 mg | ORAL_TABLET | Freq: Every day | ORAL | 0 refills | Status: DC

## 2021-05-11 MED ORDER — LOSARTAN POTASSIUM 25 MG PO TABS: 25.0000 mg | ORAL_TABLET | Freq: Every day | ORAL | 0 refills | Status: DC

## 2021-05-11 MED ORDER — TICAGRELOR 90 MG PO TABS: 90.0000 mg | ORAL_TABLET | Freq: Two times a day (BID) | ORAL | 0 refills | Status: DC

## 2021-05-11 MED ORDER — METFORMIN HCL 500 MG PO TABS
500.0000 mg | ORAL_TABLET | Freq: Two times a day (BID) | ORAL | 0 refills | Status: DC
Start: 2021-05-11 — End: 2021-06-15

## 2021-05-11 NOTE — Assessment & Plan Note (Signed)
Cont atorvastatin 80 mg.  ?

## 2021-05-11 NOTE — Addendum Note (Signed)
Addended by: Lynnda Child on: 05/11/2021 04:39 PM   Modules accepted: Orders

## 2021-05-11 NOTE — Assessment & Plan Note (Signed)
S/p I&D of abscess. Some induration present - advised monitoring and return if increasing in size. Cont doxycycline to complete course

## 2021-05-11 NOTE — Assessment & Plan Note (Signed)
C/b CVD, HLD, HTN. 2 hour post prandial CBG <190 with improved controlled. Discussed trying to move away from insulin regimen. Stop Novolog with mealtime. Start metformin with ramp-up to 1000 mg BID as tolerated. Cont Levamir 20 units BID. Cont checking CBG - call if significantly elevated. Return in 4 weeks for TOC to new provider.

## 2021-05-11 NOTE — Progress Notes (Signed)
Subjective:     Holly Garner is a 40 y.o. female presenting for hospitilazation follow-up     HPI  #HTN - recently started on losartan 25 mg and metoprolol 25 mg - has not been checking  #hx of NSTEMI  - has continued asa 81 mg and brilinta 90 mg bid - no cp, sob, ha, vision changes  #Diabetes - checking cbg 129 after lunchtime - 130-190 with 2 hours after meals - taking insulin  - diet - doing well - tobacco - vapping 3 mg of nicotine   #Abscess - continues to bleed - is still sore - the packing has been removed - small solid lesion - not increasing in size - more bruising but not red - about 1/2 way through her doxycycline  Review of Systems  7/13-14/2022: Admission - NSTEMI - peak troponin 7355. Non-occulsive coronary disease <60%. RCA blockage - dual antiplatelet and lipitor. A1c 10.8 - Levemir 20 units bid and scheduled novolog 05/07/2021: ER - Abscess - drained and placed on doxycycline. Advised to hold ASA due to rash (erythema multiforme) - also benadryl and pepcid  Social History   Tobacco Use  Smoking Status Every Day   Packs/day: 1.00   Years: 10.00   Pack years: 10.00   Types: Cigarettes  Smokeless Tobacco Never        Objective:    BP Readings from Last 3 Encounters:  05/11/21 (!) 120/58  05/07/21 (!) 154/89  05/05/21 (!) 130/99   Wt Readings from Last 3 Encounters:  05/11/21 299 lb (135.6 kg)  05/04/21 (!) 308 lb 3.2 oz (139.8 kg)  05/03/21 (!) 304 lb 3.8 oz (138 kg)    BP (!) 120/58   Pulse 95   Temp 98.3 F (36.8 C) (Temporal)   Ht 5\' 5"  (1.651 m)   Wt 299 lb (135.6 kg)   LMP 04/23/2021 (Exact Date)   SpO2 96%   BMI 49.76 kg/m    Physical Exam Constitutional:      General: She is not in acute distress.    Appearance: She is well-developed. She is not diaphoretic.  HENT:     Right Ear: External ear normal.     Left Ear: External ear normal.     Nose: Nose normal.  Eyes:     Conjunctiva/sclera: Conjunctivae  normal.  Cardiovascular:     Rate and Rhythm: Normal rate.  Pulmonary:     Effort: Pulmonary effort is normal.  Musculoskeletal:     Cervical back: Neck supple.  Skin:    General: Skin is warm and dry.     Capillary Refill: Capillary refill takes less than 2 seconds.     Comments: Right chest side - induration and thickened skin w/o fluctuance above the the incision location. No erythema. No active bleeding  Neurological:     Mental Status: She is alert. Mental status is at baseline.  Psychiatric:        Mood and Affect: Mood normal.        Behavior: Behavior normal.          Assessment & Plan:   Problem List Items Addressed This Visit       Endocrine   Type 2 diabetes mellitus with other specified complication ([place]) - Primary    C/b CVD, HLD, HTN. 2 hour post prandial CBG <190 with improved controlled. Discussed trying to move away from insulin regimen. Stop Novolog with mealtime. Start metformin with ramp-up to 1000 mg BID as tolerated. Cont  Levamir 20 units BID. Cont checking CBG - call if significantly elevated. Return in 4 weeks for TOC to new provider.        Relevant Medications   insulin detemir (LEVEMIR) 100 UNIT/ML injection   atorvastatin (LIPITOR) 80 MG tablet   losartan (COZAAR) 25 MG tablet   metFORMIN (GLUCOPHAGE) 500 MG tablet     Other   Cellulitis of chest wall    S/p I&D of abscess. Some induration present - advised monitoring and return if increasing in size. Cont doxycycline to complete course       Hx of non-ST elevation myocardial infarction (NSTEMI)    ~1 week out from NSTEMI. No cp. Cont asa 81 mg and brilinta 90 mg bid. Has cardiology f/u next week. Cont lipitor 80 mg.        Pure hypercholesterolemia    Cont atorvastatin 80 mg.        Relevant Medications   atorvastatin (LIPITOR) 80 MG tablet   losartan (COZAAR) 25 MG tablet   metoprolol succinate (TOPROL-XL) 25 MG 24 hr tablet     Return in about 4 weeks (around 06/08/2021) for  TOC with Matt.  Lesleigh Noe, MD  This visit occurred during the SARS-CoV-2 public health emergency.  Safety protocols were in place, including screening questions prior to the visit, additional usage of staff PPE, and extensive cleaning of exam room while observing appropriate contact time as indicated for disinfecting solutions.

## 2021-05-11 NOTE — Assessment & Plan Note (Signed)
~  1 week out from NSTEMI. No cp. Cont asa 81 mg and brilinta 90 mg bid. Has cardiology f/u next week. Cont lipitor 80 mg.

## 2021-05-11 NOTE — Patient Instructions (Addendum)
Your hemoglobin A1c was elevated and shows that you have diabetes.   Diabetes is treated with diet and medication- avoid sugar (especially sugary beverages like soda and sweet tea), carbohydrates - like baked goods and bread. Try to eat lean protein (fish and chicken) and lots of green veggies. You can go to Diabetes.org for more information on dietary changes and call the clinic and I can place a referral to see a diabetes nutritionist.   I would like you to start a medication call Metformin.   I am prescribing a 500 mg tablet. The most common side effect is stomach upset (nausea and diarrhea). Below is a 4 week plan to increase. If you are experiencing side effects, do not move on to the next week unless your symptoms are better.   Week 1: Take 500 mg in the morning with food Week 2: Take 500 mg in the morning and the evening with food Week 3: Take 1000 mg (2 tablets) in the morning and 500 mg in the evening with food Week 4: Take 1000 mg (2 tablets) in the morning and evening with food  Stop the Meal time insulin unless 2 hours post prandial is >220 then can give 8 units with next meal

## 2021-05-16 ENCOUNTER — Ambulatory Visit: Payer: Medicaid Other | Admitting: Nurse Practitioner

## 2021-05-16 ENCOUNTER — Other Ambulatory Visit: Payer: Self-pay

## 2021-05-16 ENCOUNTER — Encounter: Payer: Self-pay | Admitting: Nurse Practitioner

## 2021-05-16 VITALS — BP 128/80 | HR 85 | Ht 65.0 in | Wt 303.0 lb

## 2021-05-16 DIAGNOSIS — I214 Non-ST elevation (NSTEMI) myocardial infarction: Secondary | ICD-10-CM

## 2021-05-16 DIAGNOSIS — Z720 Tobacco use: Secondary | ICD-10-CM

## 2021-05-16 DIAGNOSIS — E119 Type 2 diabetes mellitus without complications: Secondary | ICD-10-CM

## 2021-05-16 DIAGNOSIS — I10 Essential (primary) hypertension: Secondary | ICD-10-CM

## 2021-05-16 DIAGNOSIS — I2510 Atherosclerotic heart disease of native coronary artery without angina pectoris: Secondary | ICD-10-CM

## 2021-05-16 DIAGNOSIS — E785 Hyperlipidemia, unspecified: Secondary | ICD-10-CM | POA: Diagnosis not present

## 2021-05-16 NOTE — Patient Instructions (Signed)
Medication Instructions:  Your physician recommends that you continue on your current medications as directed. Please refer to the Current Medication list given to you today.  *If you need a refill on your cardiac medications before your next appointment, please call your pharmacy*   Lab Work: None ordered  If you have labs (blood work) drawn today and your tests are completely normal, you will receive your results only by: MyChart Message (if you have MyChart) OR A paper copy in the mail If you have any lab test that is abnormal or we need to change your treatment, we will call you to review the results.   Testing/Procedures: None ordered  Follow-Up: At [facility], you and your health needs are our priority.  As part of our continuing mission to provide you with exceptional heart care, we have created designated Provider Care Teams.  These Care Teams include your primary Cardiologist (physician) and Advanced Practice Providers (APPs -  Physician Assistants and Nurse Practitioners) who all work together to provide you with the care you need, when you need it.  We recommend signing up for the patient portal called "MyChart".  Sign up information is provided on this After Visit Summary.  MyChart is used to connect with patients for Virtual Visits (Telemedicine).  Patients are able to view lab/test results, encounter notes, upcoming appointments, etc.  Non-urgent messages can be sent to your provider as well.   To learn more about what you can do with MyChart, go to [email or web address].    Your next appointment:   1 month(s)  The format for your next appointment:   In Person  Provider:   You may see Debbe Odea, MD or one of the following Advanced Practice Providers on your designated Care Team:   Nicolasa Ducking, NP Eula Listen, PA-C Marisue Ivan, PA-C Cadence Bryant, [place]

## 2021-05-16 NOTE — Progress Notes (Signed)
Office Visit    Patient Name: Holly Garner Date of Encounter: 05/16/2021  Primary Care Provider:  Jearld Fenton, NP Primary Cardiologist:  Kate Sable, MD  Chief Complaint    40 year old female with a history of hypertension, obesity, tobacco abuse, and abnormal Pap smear, who presents for follow-up after recent hospitalization for non-STEMI status post RCA stenting, with new diagnosis of diabetes.  Past Medical History    Past Medical History:  Diagnosis Date   BMI 50.0-59.9, adult ([place])    CAD (coronary artery disease)    a. 04/2021 NSTEMI/PCI: LM nl, LAD 16m 30d, D1 nl, LCX 662mOM1 min irregs, RCA 20p, 30p/m, 100d (2.5x26 Resolute Onyx DES).   Essential hypertension    History of echocardiogram    a. 04/2021 Echo: EF 60-65%. No rwma. Nl RV fxn.   History of Papanicolaou smear of cervix 04/2011   normal per pt.   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   hgsil   Tobacco abuse    Type II diabetes mellitus ([facility]   Past Surgical History:  Procedure Laterality Date   COLPOSCOPY  09/28/2009   CORONARY STENT INTERVENTION N/A 05/04/2021   Procedure: CORONARY STENT INTERVENTION;  Surgeon: ArWellington HampshireMD;  [place] [facility];  Service: Cardiovascular;  Laterality: N/A;   LEFT HEART CATH AND CORONARY ANGIOGRAPHY N/A 05/04/2021   Procedure: LEFT HEART CATH AND CORONARY ANGIOGRAPHY;  Surgeon: ArWellington HampshireMD;  [place] [facility];  Service: Cardiovascular;  Laterality: N/A;   TONSILLECTOMY AND ADENOIDECTOMY     TYMPANOSTOMY TUBE PLACEMENT Bilateral     Allergies  Allergies  Allergen Reactions   Contrast Media [Iodinated Diagnostic Agents] Itching   Malt    Milk-Related Compounds    Other Other (See Comments)   Shellfish Allergy     Itching, red skin to upper body    Soy Allergy     History of Present Illness    [age]ear old female with a history of hypertension, obesity, tobacco abuse, and abnormal Pap  smear.  [facility] a 3-week history of chest pain.  ECG showed diffuse T wave inversions with elevated troponin.  CT angio of the chest was unremarkable.  Recommendation was made for admission, heparinization, and diagnostic catheterization.  Unfortunately, the patient left AMA but returned in the early morning hours of July 14, secondary to worsening chest pain.  Troponin peaked at 7355.  Echocardiogram showed an EF of 60 to 65% without regional wall motion abnormalities.  Diagnostic catheterization revealed moderate diffuse multivessel disease with a total occlusion of the distal RCA.  This was treated with a drug-eluting stent.  She was found to have a hemoglobin A1c of 10.8.  She was subsequently discharged on aspirin, Brilinta, metoprolol, losartan, atorvastatin, metformin, and insulin therapy, with recommendation for outpatient follow-up.  Since discharge, she has done well.  She has been tolerating her medications, has quit smoking, and has completely cut out sweetened drinks.  She follow-up with primary care and was placed on metformin therapy.  She is interested in pursuing cardiac rehab.  She did have some fatigue initially following discharge but since switching most of her medications to bedtime, this is resolved.  She denies chest pain, dyspnea, palpitations, PND, orthopnea, dizziness, syncope, edema, or early satiety.  Home Medications    Current Outpatient Medications  Medication Sig Dispense Refill   aspirin 81 MG chewable tablet Chew 1 tablet (  81 mg total) by mouth daily. 30 tablet 0   atorvastatin (LIPITOR) 80 MG tablet Take 1 tablet (80 mg total) by mouth daily. 90 tablet 0   blood glucose meter kit and supplies KIT Dispense based on patient and insurance preference. Use up to four times daily as directed. 1 each 0   EPINEPHrine (EPIPEN 2-PAK) 0.3 mg/0.3 mL IJ SOAJ injection Inject 0.3 mLs (0.3 mg total) into the muscle once. 2 Device 0   insulin  detemir (LEVEMIR) 100 UNIT/ML injection Inject 0.2 mLs (20 Units total) into the skin 2 (two) times daily. 20 mL 0   losartan (COZAAR) 25 MG tablet Take 1 tablet (25 mg total) by mouth daily. 90 tablet 0   metFORMIN (GLUCOPHAGE) 500 MG tablet Take 1 tablet (500 mg total) by mouth 2 (two) times daily with a meal. 180 tablet 0   metoprolol succinate (TOPROL-XL) 25 MG 24 hr tablet Take 1 tablet (25 mg total) by mouth daily. 90 tablet 0   PROAIR HFA 108 (90 Base) MCG/ACT inhaler Inhale 1-2 puffs into the lungs every 6 (six) hours as needed for wheezing or shortness of breath. 8.5 g 0   ticagrelor (BRILINTA) 90 MG TABS tablet Take 1 tablet (90 mg total) by mouth 2 (two) times daily. 180 tablet 0   No current facility-administered medications for this visit.     Review of Systems    Doing well since discharge.  She did have some fatigue which has resolved.  She denies chest pain, palpitations, dyspnea, pnd, orthopnea, n, v, dizziness, syncope, edema, weight gain, or early satiety.  All other systems reviewed and are otherwise negative except as noted above.  Physical Exam    VS:  BP 128/80 (BP Location: Left Arm, Patient Position: Sitting, Cuff Size: Large)   Pulse 85   Ht _0  (1.651 m)   Wt (!) 303 lb (137.4 kg)   LMP 04/26/2021 (Approximate)   SpO2 98%   BMI 50.42 kg/m  , BMI Body mass index is 50.42 kg/m.     GEN: Obese, in no acute distress. HEENT: normal. Neck: Supple, obese, difficult to gauge JVP.  Carotid bruits, or masses. Cardiac: RRR, 1/6 systolic murmur at the upper sternal borders, no rubs, or gallops. No clubbing, cyanosis, edema.  Radials 2+/PT 1+ and equal bilaterally.  Right radial catheterization site without bleeding, bruit, or hematoma. Respiratory:  Respirations regular and unlabored, clear to auscultation bilaterally. GI: Obese, nontender, nondistended, BS + x 4. MS: no deformity or atrophy. Skin: warm and dry, no rash. Neuro:  Strength and sensation are  intact. Psych: Normal affect.  Accessory Clinical Findings    ECG personally reviewed by me today -regular sinus rhythm, 85, inferolateral T wave inversion- no acute changes.  Lab Results  Component Value Date   WBC 14.9 (H) 05/05/2021   HGB 12.6 05/05/2021   HCT 40.1 05/05/2021   MCV 81.5 05/05/2021   PLT 378 05/05/2021   Lab Results  Component Value Date   CREATININE 0.56 05/05/2021   BUN 12 05/05/2021   NA 135 05/05/2021   K 3.9 05/05/2021   CL 103 05/05/2021   CO2 25 05/05/2021   Lab Results  Component Value Date   ALT 26 04/28/2019   AST 17 04/28/2019   ALKPHOS 121 (H) 04/28/2019   BILITOT 0.5 04/28/2019   Lab Results  Component Value Date   CHOL 193 05/04/2021   HDL 26 (L) 05/04/2021   LDLCALC 130 (H) 05/04/2021   TRIG  184 (H) 05/04/2021   CHOLHDL 7.4 05/04/2021    Lab Results  Component Value Date   HGBA1C 10.8 (H) 05/03/2021    Assessment & Plan    1.  Non-STEMI, subsequent episode of care/coronary artery disease: Patient recently hospitalized with a 3-week history of progressive chest pain and elevated troponin to greater than 7000.  She was found to have an occlusion of the distal RCA as well as moderate, nonobstructive LAD and circumflex disease.  EF 60 to 65%.  The RCA was successfully treated with a drug-eluting stent.  Following discharge, she initially had some fatigue but this resolved after switching her once daily medicines to HS.  She notes compliance and good tolerance of medications.  She has quit smoking.  She is interested in cardiac rehabilitation and we will make sure referral is in place today.  We discussed the importance of medication compliance and healthy lifestyle today.  2.  Essential hypertension: Blood pressure stable today on beta-blocker and ARB therapy.  3.  Hyperlipidemia: LDL 130 on July 14.  We will plan to follow-up lipids and LFTs at next office visit in 1 month.  Continue high potency statin therapy.  4.  Type 2 diabetes  mellitus: Newly diagnosed on July 13 with an A1c of 10.8.  Now on Levemir and metformin.  She is already follow-up with primary care.  She is beginning to make lifestyle changes and understands the importance of regular activity, dietary changes, and weight loss.  With history of heart disease, consider a GLP-1 agonist such as semaglutide- defer to primary care.  5.  Morbid obesity: As above, patient understands importance of lifestyle modifications and is already begun to make some.  I will refer to cardiac rehab.  6.  Tobacco abuse: Patient has not smoked since hospital discharge.  I congratulated her on this and encouraged her to remain off of cigarettes.  7.  Disposition: Follow-up in 1 month, at which time we will follow-up lipids and LFTs.  Murray Hodgkins, NP 05/16/2021, 9:44 AM

## 2021-05-17 ENCOUNTER — Encounter: Payer: Self-pay | Admitting: Family Medicine

## 2021-05-17 DIAGNOSIS — R609 Edema, unspecified: Secondary | ICD-10-CM

## 2021-05-17 DIAGNOSIS — R7982 Elevated C-reactive protein (CRP): Secondary | ICD-10-CM

## 2021-05-17 DIAGNOSIS — M7989 Other specified soft tissue disorders: Secondary | ICD-10-CM

## 2021-05-17 NOTE — Telephone Encounter (Signed)
Ok to take prednisone w/ cardiac meds.

## 2021-05-17 NOTE — Telephone Encounter (Signed)
Please see 05/17/21 mychart message for documentation.

## 2021-05-18 NOTE — Addendum Note (Signed)
Addended by: Lynnda Child on: 05/18/2021 04:12 PM   Modules accepted: Orders

## 2021-05-19 ENCOUNTER — Ambulatory Visit: Payer: Medicaid Other | Admitting: Nurse Practitioner

## 2021-05-19 ENCOUNTER — Telehealth: Payer: Self-pay

## 2021-05-19 NOTE — Telephone Encounter (Signed)
[facility] Night - Client Nonclinical Telephone Record  [facility] Client [facility] Night - Client Client Site [facility] - Night Contact Type Call Who Is Calling Patient / Member / Family / Caregiver Caller Name [facility] Caller Phone Number [phone] Patient Name Holly Garner Patient DOB [birth year] Call Type Message Only Information Provided Reason for Call Request to Schedule Office Appointment Initial Comment The caller states that she has an upcoming appointment with Dr. Audria Nine (not listed, location confirmed) at 4 PM. The caller would like to see if it maybe possible to reschedule as she is unable to make an afternoon appointment. Patient request to speak to RN No Additional Comment The caller declined triage. Disp. Time Disposition Final User 05/18/2021 5:41:35 PM General Information Provided Yes Philmore Pali Call Closed By: Philmore Pali Transaction Date/Time: 05/18/2021 5:37:27 PM (ET)

## 2021-05-19 NOTE — Telephone Encounter (Signed)
Spoke with patient rescheduled appointment.

## 2021-05-22 NOTE — Addendum Note (Signed)
Addended by: Bryna Colander on: 05/22/2021 04:41 PM   Modules accepted: Orders

## 2021-05-24 ENCOUNTER — Other Ambulatory Visit: Payer: Self-pay

## 2021-05-24 ENCOUNTER — Encounter: Payer: Self-pay | Admitting: *Deleted

## 2021-05-24 ENCOUNTER — Encounter: Payer: Medicaid Other | Attending: Internal Medicine | Admitting: *Deleted

## 2021-05-24 VITALS — BP 108/70 | Ht 65.0 in | Wt 302.7 lb

## 2021-05-24 DIAGNOSIS — Z955 Presence of coronary angioplasty implant and graft: Secondary | ICD-10-CM | POA: Diagnosis not present

## 2021-05-24 DIAGNOSIS — E1159 Type 2 diabetes mellitus with other circulatory complications: Secondary | ICD-10-CM

## 2021-05-24 DIAGNOSIS — I252 Old myocardial infarction: Secondary | ICD-10-CM | POA: Diagnosis not present

## 2021-05-24 NOTE — Patient Instructions (Signed)
Check blood sugars 2 x day before breakfast and 2 hrs after one meal every day Bring blood sugar records to the next class  Exercise:  Walk as tolerated or exercise per cardiac rehab    Eat 3 meals day,   1-2  snacks a day Space meals 4-6 hours apart Don't skip meals - eat at least 1 protein and 1 carbohydrate serving Avoid juices unless treating a low blood sugar  Make an eye doctor appointment  Carry fast acting glucose and a snack at all times Rotate injection sites  Call back to schedule classes

## 2021-05-24 NOTE — Progress Notes (Signed)
Diabetes Self-Management Education  Visit Type: First/Initial  Appt. Start Time: 0850 Appt. End Time: 0955  05/24/2021  [name], identified by name and date of birth, is a 40 y.o. female with a diagnosis of Diabetes: Type 2.   ASSESSMENT  Blood pressure 108/70, height 5\' 5"  (1.651 m), weight (!) 302 lb 11.2 oz (137.3 kg), last menstrual period 04/26/2021. Body mass index is 50.37 kg/m.   Diabetes Self-Management Education - 05/24/21 1038       Visit Information   Visit Type First/Initial      Initial Visit   Diabetes Type Type 2    Are you currently following a meal plan? Yes    What type of meal plan do you follow? "I've cut out sodas"    Are you taking your medications as prescribed? Yes    Date Diagnosed Pt reports May 03, 2021 when she was in the hospital for a heart attack. Labs from 2 years ago and 1 year ago show A1C of 10.4% and 8.5%      Health Coping   How would you rate your overall health? Fair      Psychosocial Assessment   Patient Belief/Attitude about Diabetes Motivated to manage diabetes   "Like I can't eat anything I like"   Self-care barriers None    Self-management support Doctor's office;Family    Patient Concerns Nutrition/Meal planning;Glycemic Control;Medication;Monitoring;Weight Control;Healthy Lifestyle    Special Needs None    Preferred Learning Style Auditory;Visual;Hands on    Learning Readiness Change in progress    How often do you need to have someone help you when you read instructions, pamphlets, or other written materials from your doctor or pharmacy? 1 - Never    What is the last grade level you completed in school? some college      Pre-Education Assessment   Patient understands the diabetes disease and treatment process. Needs Instruction    Patient understands incorporating nutritional management into lifestyle. Needs Instruction    Patient undertands incorporating physical activity into lifestyle. Needs Instruction     Patient understands using medications safely. Needs Instruction    Patient understands monitoring blood glucose, interpreting and using results Needs Review    Patient understands prevention, detection, and treatment of acute complications. Needs Instruction    Patient understands prevention, detection, and treatment of chronic complications. Needs Review    Patient understands how to develop strategies to address psychosocial issues. Needs Instruction    Patient understands how to develop strategies to promote health/change behavior. Needs Instruction      Complications   Last HgB A1C per patient/outside source 10.8 %   05/03/2021   How often do you check your blood sugar? > 4 times/day    Fasting Blood glucose range (mg/dL) 05/05/2021   She reported 2 FBG's of 116 and 149 mg/dL   Postprandial Blood glucose range (mg/dL) 38-182;993-716   She reports pp's 130-180 mg/dL.   Have you had a dilated eye exam in the past 12 months? No    Have you had a dental exam in the past 12 months? Yes    Are you checking your feet? No      Dietary Intake   Breakfast usually skips or eats toast; had bacon, egg and cheese McMuffin today    Lunch salad - lettuce, tomatoes, cuccumbers, little cheese; sushi, california roll; [phone] sandwich    Snack (afternoon) fruit (pineapple, grapes, banana)    Dinner chicken, steak, beef, pork, occasional fish; potatoes, green  beans, peas, corn, lima beans, broccoli, asparagus, rice, pasta    Beverage(s) water, juice, diet soda, Crystal light      Exercise   Exercise Type ADL's      Patient Education   Previous Diabetes Education No    Disease state  Definition of diabetes, type 1 and 2, and the diagnosis of diabetes;Factors that contribute to the development of diabetes    Nutrition management  Role of diet in the treatment of diabetes and the relationship between the three main macronutrients and blood glucose level;Food label reading, portion sizes and measuring  food.;Reviewed blood glucose goals for pre and post meals and how to evaluate the patients' food intake on their blood glucose level.;Meal timing in regards to the patients' current diabetes medication.    Physical activity and exercise  Role of exercise on diabetes management, blood pressure control and cardiac health.;Helped patient identify appropriate exercises in relation to his/her diabetes, diabetes complications and other health issue.   recent MI   Medications Taught/reviewed insulin injection, site rotation, insulin storage and needle disposal.;Reviewed patients medication for diabetes, action, purpose, timing of dose and side effects.    Monitoring Purpose and frequency of SMBG.;Taught/discussed recording of test results and interpretation of SMBG.;Identified appropriate SMBG and/or A1C goals.;Yearly dilated eye exam    Acute complications Taught treatment of hypoglycemia - the 15 rule.    Chronic complications Relationship between chronic complications and blood glucose control    Psychosocial adjustment Role of stress on diabetes;Identified and addressed patients feelings and concerns about diabetes      Individualized Goals (developed by patient)   Reducing Risk Other (comment)   improve blood sugars, decrease medications, prevent diabetes complications, lose weight, lead a healthier lifestyle, become more fit     Outcomes   Expected Outcomes Demonstrated interest in learning. Expect positive outcomes             Individualized Plan for Diabetes Self-Management Training:   Learning Objective:  Patient will have a greater understanding of diabetes self-management. Patient education plan is to attend individual and/or group sessions per assessed needs and concerns.   Plan:   Patient Instructions  Check blood sugars 2 x day before breakfast and 2 hrs after one meal every day Bring blood sugar records to the next class  Exercise:  Walk as tolerated or exercise per cardiac  rehab    Eat 3 meals day,   1-2  snacks a day Space meals 4-6 hours apart Don't skip meals - eat at least 1 protein and 1 carbohydrate serving Avoid juices unless treating a low blood sugar  Make an eye doctor appointment  Carry fast acting glucose and a snack at all times Rotate injection sites  Call back to schedule classes  Expected Outcomes:  Demonstrated interest in learning. Expect positive outcomes  Education material provided:  General Meal Planning Guidelines Simple Meal Plan Glucose tablets Symptoms, causes and treatments of Hypoglycemia   If problems or questions, patient to contact team via:   Sharion Settler, RN, CCM, CDCES [phone]  Future DSME appointment: Patient has multiple MD appointments this month and will call back to schedule classes. She was given a list of classes for the next 3 months.

## 2021-05-26 ENCOUNTER — Encounter: Payer: Medicaid Other | Admitting: *Deleted

## 2021-05-26 ENCOUNTER — Other Ambulatory Visit: Payer: Self-pay

## 2021-05-26 DIAGNOSIS — Z955 Presence of coronary angioplasty implant and graft: Secondary | ICD-10-CM

## 2021-05-26 DIAGNOSIS — I214 Non-ST elevation (NSTEMI) myocardial infarction: Secondary | ICD-10-CM

## 2021-05-26 NOTE — Progress Notes (Signed)
Initial telephone orientation completed. Diagnosis can be found in [facility] 05/04/21. EP orientation scheduled for Thursday 8/11 at 8am.

## 2021-05-29 ENCOUNTER — Other Ambulatory Visit: Payer: Self-pay

## 2021-05-30 ENCOUNTER — Telehealth: Payer: Self-pay

## 2021-05-30 ENCOUNTER — Other Ambulatory Visit: Payer: Self-pay

## 2021-05-30 ENCOUNTER — Other Ambulatory Visit: Payer: Self-pay | Admitting: Nurse Practitioner

## 2021-05-30 ENCOUNTER — Telehealth: Payer: Self-pay | Admitting: *Deleted

## 2021-05-30 ENCOUNTER — Encounter: Payer: Self-pay | Admitting: Nurse Practitioner

## 2021-05-30 ENCOUNTER — Ambulatory Visit: Payer: Medicaid Other | Admitting: Nurse Practitioner

## 2021-05-30 VITALS — BP 122/74 | HR 85 | Temp 98.1°F | Resp 20 | Ht 65.0 in | Wt 303.5 lb

## 2021-05-30 DIAGNOSIS — E1169 Type 2 diabetes mellitus with other specified complication: Secondary | ICD-10-CM

## 2021-05-30 DIAGNOSIS — R609 Edema, unspecified: Secondary | ICD-10-CM | POA: Diagnosis not present

## 2021-05-30 DIAGNOSIS — I10 Essential (primary) hypertension: Secondary | ICD-10-CM | POA: Diagnosis not present

## 2021-05-30 DIAGNOSIS — I252 Old myocardial infarction: Secondary | ICD-10-CM

## 2021-05-30 DIAGNOSIS — E7800 Pure hypercholesterolemia, unspecified: Secondary | ICD-10-CM

## 2021-05-30 LAB — POCT CBG (FASTING - GLUCOSE)-MANUAL ENTRY: Glucose Fasting, POC: 210 mg/dL — AB (ref 70–99)

## 2021-05-30 MED ORDER — DAPAGLIFLOZIN PROPANEDIOL 5 MG PO TABS
5.0000 mg | ORAL_TABLET | Freq: Every day | ORAL | 2 refills | Status: DC
Start: 2021-05-30 — End: 2021-07-31

## 2021-05-30 MED ORDER — LANTUS SOLOSTAR 100 UNIT/ML ~~LOC~~ SOPN: 40.0000 [IU] | PEN_INJECTOR | Freq: Every day | SUBCUTANEOUS | 2 refills | Status: DC

## 2021-05-30 MED ORDER — FREESTYLE LIBRE 2 SENSOR MISC: 1.0000 | 2 refills | Status: DC

## 2021-05-30 MED ORDER — LANTUS SOLOSTAR 100 UNIT/ML ~~LOC~~ SOPN
40.0000 [IU] | PEN_INJECTOR | Freq: Every day | SUBCUTANEOUS | 1 refills | Status: DC
Start: 2021-05-30 — End: 2021-07-31

## 2021-05-30 NOTE — Telephone Encounter (Signed)
PA completed through covermymeds and approved. Dates: 05/30/21-11/26/21 Pharmacy notified by fax

## 2021-05-30 NOTE — Assessment & Plan Note (Signed)
Patient currently checking sugars twice a day.  Keeping a log.  No events of hypoglycemia.  Still hyperglycemic.  Working with diabetic educator and nutritionist.  Did discontinue Levemir insulin today and switch to Lantus.  Spoke with pharmacist in office about transition.  Giving ASCVD we will place patient on SGLT-2 for cardiovascular benefit.  We will start at 5 mg and titrate up to 10 mg at next office visit once we make sure patient tolerates. Start Lantus 40 units daily.  Continue metformin 1000 mg twice daily.  Start Farxiga 5 mg daily.

## 2021-05-30 NOTE — Assessment & Plan Note (Signed)
Since discharge of hospital has seen family medicine and cardiology.  Has appointment later this month with cardiology.  Did review last note they suggested GLP-1 for cardiovascular indications will defer currently as just put her on SGLT2 for cardiovascular benefit.  Continue taking Brilinta and aspirin as prescribed from cardiology and follow-up as scheduled.

## 2021-05-30 NOTE — Telephone Encounter (Signed)
Chris from [facility] called stating they the patient got her lantus solostar. Thayer Ohm stated that the patient does not have the pens to use with the lantus. Thayer Ohm is requesting that a generic script be sent in for the pens and they come in quantity of #100. Pharmacy [facility]

## 2021-05-30 NOTE — Telephone Encounter (Signed)
Chris from [facility] left v/m requesting qty changed for generic lantus solostar from # 12 ml to #15 ml. From med list appeared already done. I spoke with Thayer Ohm to verify [facility] pharmacy received the # 15 ml on generic lantus solostar. Thayer Ohm said did receive corrected qty and nothing further needed for this rx.

## 2021-05-30 NOTE — Telephone Encounter (Signed)
PA approved- 05/30/21-05/30/22. Pharmacy notified

## 2021-05-30 NOTE — Telephone Encounter (Signed)
Pharmacist at [facility] left a voicemail stating that they received a script for Lantus solostar #12. Pharmacist stated that this comes in 15 ml boxes and they can not break the box up. Pharmacist is requesting that a new script for 15 ml be sent to them.

## 2021-05-30 NOTE — Assessment & Plan Note (Signed)
Currently maintained on atorvastatin 80 mg.  Patient working with nutritionist and cardiac rehab on lifestyle modifications.  Continue atorvastatin 80 mg and continue working with specialists on modifying lifestyle.

## 2021-05-30 NOTE — Progress Notes (Signed)
Established Patient Office Visit  Subjective:  Patient ID: Holly Garner, female    DOB: [birth year]  Age: 40 y.o. MRN: [id number]  CC:  Chief Complaint  Patient presents with   Transfer of Care    From [facility]   Diabetes    Follow up 4 weeks   Joint Swelling    Hands and feet the past 2 weeks. Get severe enough that it hurts to walk. She does have a history of swelling of different parts of her body and she takes Prednisone as needed for this but the swelling in hands and feet is different    HPI Holly Garner presents for Transfer of care  DM2: checks her sugars twice daily one being fasting and post prandially after dinner. States 2 sugars over the past two weeks greater than 200. She has seen diabetic educator and is working on her nutrition. We will see about getting a CGM since she is on insulin.   HLD: Maintained on atrovastatin. Patient tolerating medications ok. She is enrolled in cardiac rehab and pending her first visit. Working with diabetic educator and nutritional changes and lifestyle  NSTEMI: Has had follow up with cardiology since her last office visit with [place]. Has another visit with them coming up this month. Doing well in office   Swelling: Has intermittent swelling of her joints. All different joints. No known cause of this. She takes prednisone prn for this and is out. She would like a refill. Has appt with Rheumatology 06/2021.  Past Medical History:  Diagnosis Date   BMI 50.0-59.9, adult ([place])    CAD (coronary artery disease)    a. 04/2021 NSTEMI/PCI: LM nl, LAD 37m 30d, D1 nl, LCX 671mOM1 min irregs, RCA 20p, 30p/m, 100d (2.5x26 Resolute Onyx DES).   Essential hypertension    History of echocardiogram    a. 04/2021 Echo: EF 60-65%. No rwma. Nl RV fxn.   History of Papanicolaou smear of cervix 04/2011   normal per pt.   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   hgsil   Tobacco abuse    [place] II diabetes mellitus [place]     Past Surgical History:  Procedure Laterality Date   COLPOSCOPY  09/28/2009   CORONARY STENT INTERVENTION N/A 05/04/2021   Procedure: CORONARY STENT INTERVENTION;  Surgeon: ArWellington HampshireMD;  [facility];  Service: Cardiovascular;  Laterality: N/A;   LEFT HEART CATH AND CORONARY ANGIOGRAPHY N/A 05/04/2021   Procedure: LEFT HEART CATH AND CORONARY ANGIOGRAPHY;  Surgeon: ArWellington HampshireMD;  [facility];  Service: Cardiovascular;  Laterality: N/A;   TONSILLECTOMY AND ADENOIDECTOMY     TYMPANOSTOMY TUBE PLACEMENT Bilateral     Family History  Problem Relation Age of Onset   Diabetes Father    Breast cancer Maternal Grandmother        late years   Diabetes Paternal Grandfather     Social History   Socioeconomic History   Marital status: Married    Spouse name: Not on file   Number of children: Not on file   Years of education: 12   Highest education level: Not on file  Occupational History   Occupation: unemployed   Occupation: homemaker  Tobacco Use   Smoking status: Former    Packs/day: 1.00    Years: 10.00    Pack years: 10.00    Types: Cigarettes    Quit date: 05/03/2021    Years  since quitting: 0.0   Smokeless tobacco: Never  Vaping Use   Vaping Use: Never used  Substance and Sexual Activity   Alcohol use: No   Drug use: No   Sexual activity: Yes    Birth control/protection: Surgical    Comment: Vasectomy  Other Topics Concern   Not on file  Social History Narrative   Not on file   Social Determinants of Health   Financial Resource Strain: Not on file  Food Insecurity: Not on file  Transportation Needs: Not on file  Physical Activity: Not on file  Stress: Not on file  Social Connections: Not on file  Intimate Partner Violence: Not on file    Outpatient Medications Prior to Visit  Medication Sig Dispense Refill   aspirin 81 MG chewable tablet Chew 1 tablet (81 mg total) by mouth daily. 30 tablet 0    atorvastatin (LIPITOR) 80 MG tablet Take 1 tablet (80 mg total) by mouth daily. 90 tablet 0   blood glucose meter kit and supplies KIT Dispense based on patient and insurance preference. Use up to four times daily as directed. 1 each 0   EPINEPHrine (EPIPEN 2-PAK) 0.3 mg/0.3 mL IJ SOAJ injection Inject 0.3 mLs (0.3 mg total) into the muscle once. 2 Device 0   insulin detemir (LEVEMIR) 100 UNIT/ML injection Inject 0.2 mLs (20 Units total) into the skin 2 (two) times daily. 20 mL 0   losartan (COZAAR) 25 MG tablet Take 1 tablet (25 mg total) by mouth daily. 90 tablet 0   metFORMIN (GLUCOPHAGE) 500 MG tablet Take 1 tablet (500 mg total) by mouth 2 (two) times daily with a meal. (Patient taking differently: Take 1,000 mg by mouth 2 (two) times daily with a meal.) 180 tablet 0   metoprolol succinate (TOPROL-XL) 25 MG 24 hr tablet Take 1 tablet (25 mg total) by mouth daily. 90 tablet 0   predniSONE (DELTASONE) 10 MG tablet Take 20 mg by mouth daily as needed.     PROAIR HFA 108 (90 Base) MCG/ACT inhaler Inhale 1-2 puffs into the lungs every 6 (six) hours as needed for wheezing or shortness of breath. 8.5 g 0   ticagrelor (BRILINTA) 90 MG TABS tablet Take 1 tablet (90 mg total) by mouth 2 (two) times daily. 180 tablet 0   No facility-administered medications prior to visit.    Allergies  Allergen Reactions   Contrast Media [Iodinated Diagnostic Agents] Itching    ROS Review of Systems  Constitutional:  Positive for fatigue. Negative for chills and fever.  Respiratory:  Negative for cough, shortness of breath and wheezing.   Cardiovascular:  Negative for chest pain and palpitations.  Gastrointestinal:  Negative for diarrhea, nausea and vomiting.  Musculoskeletal:  Positive for arthralgias.     Objective:    Physical Exam Vitals and nursing note reviewed.  Constitutional:      Appearance: She is obese.  Neck:     Vascular: No carotid bruit.  Cardiovascular:     Rate and Rhythm: Normal  rate and regular rhythm.     Pulses: Normal pulses.     Heart sounds: Normal heart sounds.  Pulmonary:     Effort: Pulmonary effort is normal.     Breath sounds: Normal breath sounds.  Abdominal:     General: Bowel sounds are normal.  Musculoskeletal:     Right lower leg: No edema.     Left lower leg: No edema.       Feet:  Lymphadenopathy:  Cervical: No cervical adenopathy.  Skin:    General: Skin is warm.  Neurological:     Mental Status: She is alert.    BP 122/74   Pulse 85   Temp 98.1 F (36.7 C)   Resp 20   Ht 5' 5" (1.651 m)   Wt (!) 303 lb 8 oz (137.7 kg)   LMP 05/25/2021   SpO2 97%   BMI 50.51 kg/m  Wt Readings from Last 3 Encounters:  05/30/21 (!) 303 lb 8 oz (137.7 kg)  05/24/21 (!) 302 lb 11.2 oz (137.3 kg)  05/16/21 (!) 303 lb (137.4 kg)     Health Maintenance Due  Topic Date Due   PNEUMOCOCCAL POLYSACCHARIDE VACCINE AGE 59-64 HIGH RISK  Never done   COVID-19 Vaccine (1) Never done   Pneumococcal Vaccine 81-20 Years old (1 - PCV) Never done   Hepatitis C Screening  Never done   TETANUS/TDAP  Never done   FOOT EXAM  07/22/2020   OPHTHALMOLOGY EXAM  08/05/2020   INFLUENZA VACCINE  05/22/2021    There are no preventive care reminders to display for this patient.  Lab Results  Component Value Date   TSH 1.19 04/28/2019   Lab Results  Component Value Date   WBC 14.9 (H) 05/05/2021   HGB 12.6 05/05/2021   HCT 40.1 05/05/2021   MCV 81.5 05/05/2021   PLT 378 05/05/2021   Lab Results  Component Value Date   NA 135 05/05/2021   K 3.9 05/05/2021   CO2 25 05/05/2021   GLUCOSE 298 (H) 05/05/2021   BUN 12 05/05/2021   CREATININE 0.56 05/05/2021   BILITOT 0.5 04/28/2019   ALKPHOS 121 (H) 04/28/2019   AST 17 04/28/2019   ALT 26 04/28/2019   PROT 6.7 04/28/2019   ALBUMIN 4.0 04/28/2019   CALCIUM 9.7 05/05/2021   ANIONGAP 7 05/05/2021   GFR 117.88 04/07/2019   Lab Results  Component Value Date   CHOL 193 05/04/2021   Lab Results   Component Value Date   HDL 26 (L) 05/04/2021   Lab Results  Component Value Date   LDLCALC 130 (H) 05/04/2021   Lab Results  Component Value Date   TRIG 184 (H) 05/04/2021   Lab Results  Component Value Date   CHOLHDL 7.4 05/04/2021   Lab Results  Component Value Date   HGBA1C 10.8 (H) 05/03/2021      Assessment & Plan:   Problem List Items Addressed This Visit       Cardiovascular and Mediastinum   HTN (hypertension)    Well-controlled on current therapy.  Patient currently maintained on losartan 25 mg, metoprolol succinate 25 mg. Continue losartan 25 mg and metoprolol succinate 25 mg. Continue working with nutritionist and cardiac rehab to work on lifestyle modifications.         Endocrine   Type 2 diabetes mellitus with other specified complication [facility])    Patient currently checking sugars twice a day.  Keeping a log.  No events of hypoglycemia.  Still hyperglycemic.  Working with diabetic educator and nutritionist.  Did discontinue Levemir insulin today and switch to Lantus.  Spoke with pharmacist in office about transition.  Giving ASCVD we will place patient on SGLT-2 for cardiovascular benefit.  We will start at 5 mg and titrate up to 10 mg at next office visit once we make sure patient tolerates. Start Lantus 40 units daily.  Continue metformin 1000 mg twice daily.  Start Farxiga 5 mg daily.  Relevant Medications   insulin glargine (LANTUS SOLOSTAR) 100 UNIT/ML Solostar Pen   dapagliflozin propanediol (FARXIGA) 5 MG TABS tablet   Continuous Blood Gluc Sensor (FREESTYLE LIBRE 2 SENSOR) MISC   Other Relevant Orders   Glucose (CBG), Fasting (Completed)     Other   Swelling    Has been longstanding problem.  Patient uses prednisone as needed basis for swelling and joint pain.  Per report states uses it once every 2 weeks or so.  Did discuss the side effects of prednisone on blood sugar control.  Patient was aware.  Patient does have appointment with  rheumatologist in September 2022.  Defer lab draws today to rheumatologist.  Hold prednisone as we are working on glycemic control       Morbid obesity ([place])    Patient working with cardiac rehab and diabetic educator/nutritionist working on lifestyle modifications including including diet and exercise as tolerated.  Continue working with specialists to modify lifestyle       Relevant Medications   insulin glargine (LANTUS SOLOSTAR) 100 UNIT/ML Solostar Pen   dapagliflozin propanediol (FARXIGA) 5 MG TABS tablet   Hx of non-ST elevation myocardial infarction (NSTEMI) - Primary    Since discharge of hospital has seen family medicine and cardiology.  Has appointment later this month with cardiology.  Did review last note they suggested GLP-1 for cardiovascular indications will defer currently as just put her on SGLT2 for cardiovascular benefit.  Continue taking Brilinta and aspirin as prescribed from cardiology and follow-up as scheduled.       Pure hypercholesterolemia    Currently maintained on atorvastatin 80 mg.  Patient working with nutritionist and cardiac rehab on lifestyle modifications.  Continue atorvastatin 80 mg and continue working with specialists on modifying lifestyle.        No orders of the defined types were placed in this encounter.   Follow-up: Return in about 2 months (around 07/30/2021) for Recheck DM.   This visit occurred during the SARS-CoV-2 public health emergency.  Safety protocols were in place, including screening questions prior to the visit, additional usage of staff PPE, and extensive cleaning of exam room while observing appropriate contact time as indicated for disinfecting solutions.   Romilda Garret, NP

## 2021-05-30 NOTE — Telephone Encounter (Signed)
PA for Farxiga submitted through [email or web address] Pending decision  Key: [id number] - PA Case ID: [id number] - Rx #: [id number]

## 2021-05-30 NOTE — Assessment & Plan Note (Signed)
Has been longstanding problem.  Patient uses prednisone as needed basis for swelling and joint pain.  Per report states uses it once every 2 weeks or so.  Did discuss the side effects of prednisone on blood sugar control.  Patient was aware.  Patient does have appointment with rheumatologist in September 2022.  Defer lab draws today to rheumatologist.  Hold prednisone as we are working on glycemic control

## 2021-05-30 NOTE — Assessment & Plan Note (Signed)
Patient working with cardiac rehab and diabetic educator/nutritionist working on lifestyle modifications including including diet and exercise as tolerated.  Continue working with specialists to modify lifestyle

## 2021-05-30 NOTE — Assessment & Plan Note (Signed)
Well-controlled on current therapy.  Patient currently maintained on losartan 25 mg, metoprolol succinate 25 mg. Continue losartan 25 mg and metoprolol succinate 25 mg. Continue working with nutritionist and cardiac rehab to work on lifestyle modifications.

## 2021-05-30 NOTE — Patient Instructions (Signed)
We will see you in 2 months. If you have any problems let me know If you can send me a log of your sugars after we start the new medications

## 2021-05-31 ENCOUNTER — Other Ambulatory Visit: Payer: Self-pay | Admitting: Nurse Practitioner

## 2021-05-31 DIAGNOSIS — E1169 Type 2 diabetes mellitus with other specified complication: Secondary | ICD-10-CM

## 2021-05-31 MED ORDER — PEN NEEDLES 30G X 8 MM MISC: 1.0000 | Freq: Every day | 3 refills | Status: DC

## 2021-06-05 ENCOUNTER — Other Ambulatory Visit: Payer: Self-pay | Admitting: Nurse Practitioner

## 2021-06-05 ENCOUNTER — Encounter: Payer: Self-pay | Admitting: Nurse Practitioner

## 2021-06-05 DIAGNOSIS — I2510 Atherosclerotic heart disease of native coronary artery without angina pectoris: Secondary | ICD-10-CM | POA: Diagnosis not present

## 2021-06-05 DIAGNOSIS — Z6841 Body Mass Index (BMI) 40.0 and over, adult: Secondary | ICD-10-CM | POA: Diagnosis not present

## 2021-06-05 DIAGNOSIS — L509 Urticaria, unspecified: Secondary | ICD-10-CM | POA: Diagnosis not present

## 2021-06-05 MED ORDER — PREDNISONE 10 MG PO TABS: 20.0000 mg | ORAL_TABLET | Freq: Every day | ORAL | 0 refills | Status: DC | PRN

## 2021-06-08 ENCOUNTER — Encounter: Payer: Medicaid Other | Admitting: Nurse Practitioner

## 2021-06-12 ENCOUNTER — Encounter: Payer: Self-pay | Admitting: Nurse Practitioner

## 2021-06-15 ENCOUNTER — Ambulatory Visit: Payer: Medicaid Other | Admitting: Nurse Practitioner

## 2021-06-15 ENCOUNTER — Other Ambulatory Visit: Payer: Self-pay

## 2021-06-15 ENCOUNTER — Encounter: Payer: Self-pay | Admitting: Nurse Practitioner

## 2021-06-15 VITALS — BP 120/80 | HR 88 | Ht 65.0 in | Wt 293.2 lb

## 2021-06-15 DIAGNOSIS — I2510 Atherosclerotic heart disease of native coronary artery without angina pectoris: Secondary | ICD-10-CM

## 2021-06-15 DIAGNOSIS — I10 Essential (primary) hypertension: Secondary | ICD-10-CM

## 2021-06-15 DIAGNOSIS — E785 Hyperlipidemia, unspecified: Secondary | ICD-10-CM

## 2021-06-15 DIAGNOSIS — E1169 Type 2 diabetes mellitus with other specified complication: Secondary | ICD-10-CM | POA: Diagnosis not present

## 2021-06-15 DIAGNOSIS — E7800 Pure hypercholesterolemia, unspecified: Secondary | ICD-10-CM | POA: Diagnosis not present

## 2021-06-15 NOTE — Patient Instructions (Signed)
Medication Instructions: Your physician recommends that you continue on your current medications as directed. Please refer to the Current Medication list given to you today.    *If you need a refill on your cardiac medications before your next appointment, please call your pharmacy*   Lab Work:  Lipid/Liver   If you have labs (blood work) drawn today and your tests are completely normal, you will receive your results only by: MyChart Message (if you have MyChart) OR A paper copy in the mail If you have any lab test that is abnormal or we need to change your treatment, we will call you to review the results.   Testing/Procedures:  None    Follow-Up: At [facility], you and your health needs are our priority.  As part of our continuing mission to provide you with exceptional heart care, we have created designated Provider Care Teams.  These Care Teams include your primary Cardiologist (physician) and Advanced Practice Providers (APPs -  Physician Assistants and Nurse Practitioners) who all work together to provide you with the care you need, when you need it.  We recommend signing up for the patient portal called "MyChart".  Sign up information is provided on this After Visit Summary.  MyChart is used to connect with patients for Virtual Visits (Telemedicine).  Patients are able to view lab/test results, encounter notes, upcoming appointments, etc.  Non-urgent messages can be sent to your provider as well.   To learn more about what you can do with MyChart, go to [email or web address].    Your next appointment:   3 month(s)  The format for your next appointment:   In Person  Provider:   You may see Debbe Odea, MD or one of the following Advanced Practice Providers on your designated Care Team:   Nicolasa Ducking, NP Eula Listen, PA-C Marisue Ivan, PA-C Cadence [place], [place]

## 2021-06-15 NOTE — Progress Notes (Signed)
Office Visit    Patient Name: Holly Garner Date of Encounter: 06/15/2021  Primary Care Provider:  Michela Pitcher, NP Primary Cardiologist:  Kate Sable, MD  Chief Complaint    39 year old female with a history of CAD, hypertension, hyperlipidemia, obesity, tobacco abuse, and abnormal Pap smear, who presents for follow-up of CAD.  Past Medical History    Past Medical History:  Diagnosis Date   BMI 50.0-59.9, adult ([place])    CAD (coronary artery disease)    a. 04/2021 NSTEMI/PCI: LM nl, LAD 53m 30d, D1 nl, LCX 653mOM1 min irregs, RCA 20p, 30p/m, 100d (2.5x26 Resolute Onyx DES).   Essential hypertension    History of echocardiogram    a. 04/2021 Echo: EF 60-65%. No rwma. Nl RV fxn.   History of Papanicolaou smear of cervix 04/2011   normal per pt.   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   hgsil   Tobacco abuse    [place] diabetes [place]   Past Surgical History:  Procedure Laterality Date   COLPOSCOPY  09/28/2009   CORONARY STENT INTERVENTION N/A 05/04/2021   Procedure: CORONARY STENT INTERVENTION;  Surgeon: ArWellington HampshireMD;  [facility];  Service: Cardiovascular;  Laterality: N/A;   LEFT HEART CATH AND CORONARY ANGIOGRAPHY N/A 05/04/2021   Procedure: LEFT HEART CATH AND CORONARY ANGIOGRAPHY;  Surgeon: ArWellington HampshireMD;  [facility];  Service: Cardiovascular;  Laterality: N/A;   TONSILLECTOMY AND ADENOIDECTOMY     TYMPANOSTOMY TUBE PLACEMENT Bilateral     Allergies  Allergies  Allergen Reactions   Contrast Media [Iodinated Diagnostic Agents] Itching    History of Present Illness    [age]ear old female with the above past medical history including hypertension, obesity, tobacco abuse, and abnormal Pap smear.  [facility] with a 3-week history of chest pain.  ECG showed diffuse T wave inversions with elevated troponin.  CT angio of the chest was  unremarkable.  She left AMA.  She returned to the morning of July 14 with worsening chest pain.  Troponin peaked at 7355.  Echo showed an EF of 60-65% without regional wall motion abnormalities.  Diagnostic catheterization revealed moderate diffuse multivessel disease with a total occlusion of the distal RCA.  This was treated with a drug-eluting stent.  She was found to have a hemoglobin A1c of 10.8.  At office follow-up on July 26, she was feeling well without chest pain or dyspnea.  She quit smoking and completely cut out sweetened drinks.  Since then, she has continued to feel well.  Earlier this week, she began walking 1 mile 3 times per day.  She is continue to watch her caloric intake closely and her weight is down 13 pounds since hospital discharge.  She is followed closely with primary care and has had adjustments to her diabetes management.  She denies chest pain, dyspnea, palpitations, PND, orthopnea, dizziness, syncope, edema, or early satiety.  She has been having some itching after taking her p.m. medications.  She has not had any rash.  Though she takes 2 medications twice a day, atorvastatin and metoprolol are the only ones that she takes once daily at night.  Home Medications    Current Outpatient Medications  Medication Sig Dispense Refill   ACCU-CHEK GUIDE test strip USE TO TEST BLOOD SUGARS UP TO FOUR TIMES DAILY 100 each 1   Accu-Chek Softclix Lancets lancets USE  TO TEST BLOOD SUGARS UP TO FOUR TIMES DAILY 100 each 1   aspirin 81 MG chewable tablet Chew 1 tablet (81 mg total) by mouth daily. 30 tablet 0   atorvastatin (LIPITOR) 80 MG tablet Take 1 tablet (80 mg total) by mouth daily. 90 tablet 0   blood glucose meter kit and supplies KIT Dispense based on patient and insurance preference. Use up to four times daily as directed. 1 each 0   Continuous Blood Gluc Sensor (FREESTYLE LIBRE 2 SENSOR) MISC 1 each by Does not apply route as directed. 2 each 2   dapagliflozin propanediol  (FARXIGA) 5 MG TABS tablet Take 1 tablet (5 mg total) by mouth daily before breakfast. 30 tablet 2   EPINEPHrine (EPIPEN 2-PAK) 0.3 mg/0.3 mL IJ SOAJ injection Inject 0.3 mLs (0.3 mg total) into the muscle once. 2 Device 0   insulin glargine (LANTUS SOLOSTAR) 100 UNIT/ML Solostar Pen Inject 40 Units into the skin daily. STOP levemir 15 mL 1   Insulin Pen Needle (PEN NEEDLES) 30G X 8 MM MISC 1 each by Does not apply route daily. 100 each 3   losartan (COZAAR) 25 MG tablet Take 1 tablet (25 mg total) by mouth daily. 90 tablet 0   metFORMIN (GLUCOPHAGE) 1000 MG tablet Take 1,000 mg by mouth 2 (two) times daily with a meal.     metoprolol succinate (TOPROL-XL) 25 MG 24 hr tablet Take 1 tablet (25 mg total) by mouth daily. 90 tablet 0   predniSONE (DELTASONE) 10 MG tablet Take 2 tablets (20 mg total) by mouth daily as needed. 4 tablet 0   PROAIR HFA 108 (90 Base) MCG/ACT inhaler Inhale 1-2 puffs into the lungs every 6 (six) hours as needed for wheezing or shortness of breath. 8.5 g 0   ticagrelor (BRILINTA) 90 MG TABS tablet Take 1 tablet (90 mg total) by mouth 2 (two) times daily. 180 tablet 0   No current facility-administered medications for this visit.    Review of Systems    Doing well.  She denies chest pain, palpitations, dyspnea, pnd, orthopnea, n, v, dizziness, syncope, edema, weight gain, or early satiety.  Itching after taking p.o. medications.  All other systems reviewed and are otherwise negative except as noted above.  Physical Exam    VS:  BP 120/80 (BP Location: Right Arm, Patient Position: Sitting, Cuff Size: Normal)   Pulse 88   Ht _0  (1.651 m)   Wt 293 lb 4 oz (133 kg)   LMP 05/25/2021   SpO2 98%   BMI 48.80 kg/m  , BMI Body mass index is 48.8 kg/m.     GEN: Well nourished, well developed, in no acute distress. HEENT: normal. Neck: Supple, obese, difficult to gauge JVP., carotid bruits, or masses. Cardiac: RRR, no murmurs, rubs, or gallops. No clubbing, cyanosis,  edema.  Radials/PT 2+ and equal bilaterally.  Respiratory:  Respirations regular and unlabored, clear to auscultation bilaterally. GI: Obese, soft, nontender, nondistended, BS + x 4. MS: no deformity or atrophy. Skin: warm and dry, no rash. Neuro:  Strength and sensation are intact. Psych: Normal affect.  Accessory Clinical Findings    ECG personally reviewed by me today -regular sinus rhythm, 88, prior inferior infarct, inferior and anterolateral T wave inversion- no acute changes.  Lab Results  Component Value Date   WBC 14.9 (H) 05/05/2021   HGB 12.6 05/05/2021   HCT 40.1 05/05/2021   MCV 81.5 05/05/2021   PLT 378 05/05/2021   Lab Results  Component Value Date   CREATININE 0.56 05/05/2021   BUN 12 05/05/2021   NA 135 05/05/2021   K 3.9 05/05/2021   CL 103 05/05/2021   CO2 25 05/05/2021   Lab Results  Component Value Date   ALT 26 04/28/2019   AST 17 04/28/2019   ALKPHOS 121 (H) 04/28/2019   BILITOT 0.5 04/28/2019   Lab Results  Component Value Date   CHOL 193 05/04/2021   HDL 26 (L) 05/04/2021   LDLCALC 130 (H) 05/04/2021   TRIG 184 (H) 05/04/2021   CHOLHDL 7.4 05/04/2021    Lab Results  Component Value Date   HGBA1C 10.8 (H) 05/03/2021    Assessment & Plan    1.  Coronary artery disease: Hospitalized in July of this year with progressive chest pain and non-STEMI.  Found to have an occluded right coronary artery as well as moderate, nonobstructive LAD and circumflex disease.  EF 60-65%.  The RCA was treated with drug-eluting stent.  She has done well since her last visit at the end of July.  She is not having any chest pain or dyspnea and is now walking 1 mile 3 times per day.  She has been contacted by cardiac rehab and is looking forward to enrolling.  As noted above, she has had some itching across her chest after taking her p.m. doses of atorvastatin and metoprolol.  I have advised her to hold her atorvastatin tonight to see if symptoms recur and if so, then  it would appear the metoprolol is the culprit.  She will do the reverse tomorrow if necessary.  I have asked her to contact us to let us know what she discovers as we would like to use an alternate beta-blocker or statin if appropriate.  2.  Essential hypertension: Blood pressure stable today at 120/80.  Continue beta-blocker and ARB.  3.  Hyperlipidemia: LDL was 130 in July.  We will follow-up lipids and LFTs.  Continue high potency statin therapy.  See #1 regarding itching.  4.  Type 2 diabetes mellitus: Newly diagnosed in July with an A1c of 10.8 at that time.  She is followed by primary care and is now on Lantus, Glucophage, and Wilder Glade, which was recently added.  As noted above, her weight is down 13 pounds since discharge and she has begun exercising.  She is quite motivated at this time.  Consider GLP-1 agonist such as semaglutide-defer to primary care.  5.  Morbid obesity: Patient has been making significant lifestyle modifications and is now exercising regularly.  Consider semaglutide.  6.  Tobacco abuse: She has not smoked since hospital discharge.  I congratulated her on this and encouraged her to remain off of cigarettes.  7.  Disposition: Follow-up lipids and LFTs.  Follow-up in clinic in 3 months or sooner if necessary.   Murray Hodgkins, NP 06/15/2021, 4:41 PM

## 2021-06-16 ENCOUNTER — Encounter: Payer: Medicaid Other | Admitting: Primary Care

## 2021-06-16 LAB — HEPATIC FUNCTION PANEL
ALT: 19 IU/L (ref 0–32)
AST: 14 IU/L (ref 0–40)
Albumin: 4.2 g/dL (ref 3.8–4.8)
Alkaline Phosphatase: 124 IU/L — ABNORMAL HIGH (ref 44–121)
Bilirubin Total: 0.3 mg/dL (ref 0.0–1.2)
Bilirubin, Direct: 0.1 mg/dL (ref 0.00–0.40)
Total Protein: 7.4 g/dL (ref 6.0–8.5)

## 2021-06-16 LAB — LIPID PANEL
Chol/HDL Ratio: 4.5 ratio — ABNORMAL HIGH (ref 0.0–4.4)
Cholesterol, Total: 163 mg/dL (ref 100–199)
HDL: 36 mg/dL — ABNORMAL LOW (ref >39)
LDL Chol Calc (NIH): 105 mg/dL — ABNORMAL HIGH (ref 0–99)
Triglycerides: 123 mg/dL (ref 0–149)
VLDL Cholesterol Cal: 22 mg/dL (ref 5–40)

## 2021-06-19 ENCOUNTER — Encounter: Payer: Self-pay | Admitting: Obstetrics and Gynecology

## 2021-06-19 ENCOUNTER — Encounter: Payer: Medicaid Other | Admitting: *Deleted

## 2021-06-19 ENCOUNTER — Other Ambulatory Visit: Payer: Self-pay

## 2021-06-19 ENCOUNTER — Ambulatory Visit (INDEPENDENT_AMBULATORY_CARE_PROVIDER_SITE_OTHER): Payer: Medicaid Other | Admitting: Obstetrics and Gynecology

## 2021-06-19 VITALS — BP 120/80 | Ht 65.0 in | Wt 298.0 lb

## 2021-06-19 VITALS — Ht 66.0 in | Wt 297.4 lb

## 2021-06-19 DIAGNOSIS — E1165 Type 2 diabetes mellitus with hyperglycemia: Secondary | ICD-10-CM

## 2021-06-19 DIAGNOSIS — Z955 Presence of coronary angioplasty implant and graft: Secondary | ICD-10-CM | POA: Diagnosis not present

## 2021-06-19 DIAGNOSIS — N920 Excessive and frequent menstruation with regular cycle: Secondary | ICD-10-CM | POA: Diagnosis not present

## 2021-06-19 DIAGNOSIS — Z01419 Encounter for gynecological examination (general) (routine) without abnormal findings: Secondary | ICD-10-CM

## 2021-06-19 DIAGNOSIS — I252 Old myocardial infarction: Secondary | ICD-10-CM

## 2021-06-19 DIAGNOSIS — I214 Non-ST elevation (NSTEMI) myocardial infarction: Secondary | ICD-10-CM

## 2021-06-19 DIAGNOSIS — Z30014 Encounter for initial prescription of intrauterine contraceptive device: Secondary | ICD-10-CM | POA: Diagnosis not present

## 2021-06-19 MED ORDER — MISOPROSTOL 100 MCG PO TABS: 100.0000 ug | ORAL_TABLET | Freq: Once | ORAL | 0 refills | Status: DC

## 2021-06-19 NOTE — Progress Notes (Signed)
Chief Complaint  Patient presents with   Gynecologic Exam    Considering IUD again due to heavy cycles     HPI:      [name] is a 40 y.o. [id number] who LMP was Patient's last menstrual period was 05/25/2021 (exact date)., presents today for her annual examination.  Her menses are monthly, lasting 7 days, heavy flow, no BTB, mild to mod dysmen, not improved with tylenol (can no longer take NSAIDs per cardio). Now on blood thinners since s/p NSTEMI 05/03/21.  No anemia on 7/22 CBC. Would like another Mirena IUD for cycle control. Was amenorrheic when had Mirena before, removed 2 yrs ago due to expiration. Tried to place last yr but couldn't get through cx os. Suggested pt come back with MD for insertion but never did.   Doing cardiac rehab now.   Sex activity: single partner, contraception - vasectomy.   Last Pap: 06/14/20 and 04/23/19 Results: no abnormalities/neg HPV DNA; 2018 with POS HPV DNA. Repeat pap due in 3 yrs per [facility] (2024)   There is a FH of breast cancer in her MGM, genetic testing not indicated. There is no FH of ovarian cancer. The patient does do self-breast exams.  Chest wall abscess 7/22 treated with abx but had to be I&D. Healing now.  Tobacco use: quit after MI Alcohol use: none  Drug use: none Exercise: mod active  She does get adequate calcium but not Vitamin D in her diet. Labs with PCP.    Past Medical History:  Diagnosis Date   BMI 50.0-59.9, adult ([facility])    CAD (coronary artery disease)    a. 04/2021 NSTEMI/PCI: LM nl, LAD 60m 30d, D1 nl, LCX 630mOM1 min irregs, RCA 20p, 30p/m, 100d (2.5x26 Resolute Onyx DES).   Essential hypertension    History of echocardiogram    a. 04/2021 Echo: EF 60-65%. No rwma. Nl RV fxn.   History of Papanicolaou smear of cervix 04/2011   normal per pt.   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   hgsil   Tobacco abuse    Type II diabetes mellitus ([facility]    Past Surgical History:  Procedure  Laterality Date   COLPOSCOPY  09/28/2009   CORONARY STENT INTERVENTION N/A 05/04/2021   Procedure: CORONARY STENT INTERVENTION;  Surgeon: ArWellington HampshireMD;  [facility];  Service: Cardiovascular;  Laterality: N/A;   LEFT HEART CATH AND CORONARY ANGIOGRAPHY N/A 05/04/2021   Procedure: LEFT HEART CATH AND CORONARY ANGIOGRAPHY;  Surgeon: ArWellington HampshireMD;  [facility];  Service: Cardiovascular;  Laterality: N/A;   TONSILLECTOMY AND ADENOIDECTOMY     TYMPANOSTOMY TUBE PLACEMENT Bilateral     Family History  Problem Relation Age of Onset   Diabetes Father    Breast cancer Maternal Grandmother        late years   Diabetes Paternal Grandfather     Social History   Socioeconomic History   Marital status: Married    Spouse name: Not on file   Number of children: Not on file   Years of education: 12   Highest education level: Not on file  Occupational History   Occupation: unemployed   Occupation: homemaker  Tobacco Use   Smoking status: Former    Packs/day: 1.00    Years: 10.00    Pack years: 10.00    Types: Cigarettes    Quit date: 05/03/2021    Years since quitting:  0.1   Smokeless tobacco: Never  Vaping Use   Vaping Use: Never used  Substance and Sexual Activity   Alcohol use: No   Drug use: No   Sexual activity: Yes    Birth control/protection: Surgical    Comment: Vasectomy  Other Topics Concern   Not on file  Social History Narrative   Not on file   Social Determinants of Health   Financial Resource Strain: Not on file  Food Insecurity: Not on file  Transportation Needs: Not on file  Physical Activity: Not on file  Stress: Not on file  Social Connections: Not on file  Intimate Partner Violence: Not on file     Current Outpatient Medications:    ACCU-CHEK GUIDE test strip, USE TO TEST BLOOD SUGARS UP TO FOUR TIMES DAILY, Disp: 100 each, Rfl: 1   Accu-Chek Softclix Lancets lancets, USE TO TEST BLOOD SUGARS UP TO  FOUR TIMES DAILY, Disp: 100 each, Rfl: 1   aspirin 81 MG chewable tablet, Chew 1 tablet (81 mg total) by mouth daily., Disp: 30 tablet, Rfl: 0   atorvastatin (LIPITOR) 80 MG tablet, Take 1 tablet (80 mg total) by mouth daily., Disp: 90 tablet, Rfl: 0   blood glucose meter kit and supplies KIT, Dispense based on patient and insurance preference. Use up to four times daily as directed., Disp: 1 each, Rfl: 0   Continuous Blood Gluc Sensor (FREESTYLE LIBRE 2 SENSOR) MISC, 1 each by Does not apply route as directed., Disp: 2 each, Rfl: 2   dapagliflozin propanediol (FARXIGA) 5 MG TABS tablet, Take 1 tablet (5 mg total) by mouth daily before breakfast., Disp: 30 tablet, Rfl: 2   EPINEPHrine (EPIPEN 2-PAK) 0.3 mg/0.3 mL IJ SOAJ injection, Inject 0.3 mLs (0.3 mg total) into the muscle once., Disp: 2 Device, Rfl: 0   insulin glargine (LANTUS SOLOSTAR) 100 UNIT/ML Solostar Pen, Inject 40 Units into the skin daily. STOP levemir, Disp: 15 mL, Rfl: 1   Insulin Pen Needle (PEN NEEDLES) 30G X 8 MM MISC, 1 each by Does not apply route daily., Disp: 100 each, Rfl: 3   losartan (COZAAR) 25 MG tablet, Take 1 tablet (25 mg total) by mouth daily., Disp: 90 tablet, Rfl: 0   metFORMIN (GLUCOPHAGE) 1000 MG tablet, Take 1,000 mg by mouth 2 (two) times daily with a meal., Disp: , Rfl:    metoprolol succinate (TOPROL-XL) 25 MG 24 hr tablet, Take 1 tablet (25 mg total) by mouth daily., Disp: 90 tablet, Rfl: 0   misoprostol (CYTOTEC) 100 MCG tablet, Take 1 tablet (100 mcg total) by mouth once for 1 dose. 1 hour before appt, Disp: 1 tablet, Rfl: 0   predniSONE (DELTASONE) 10 MG tablet, Take 2 tablets (20 mg total) by mouth daily as needed., Disp: 4 tablet, Rfl: 0   PROAIR HFA 108 (90 Base) MCG/ACT inhaler, Inhale 1-2 puffs into the lungs every 6 (six) hours as needed for wheezing or shortness of breath., Disp: 8.5 g, Rfl: 0   ticagrelor (BRILINTA) 90 MG TABS tablet, Take 1 tablet (90 mg total) by mouth 2 (two) times daily., Disp:  180 tablet, Rfl: 0  ROS:  Review of Systems  Constitutional:  Negative for fatigue, fever and unexpected weight change.  Respiratory:  Negative for cough, shortness of breath and wheezing.   Cardiovascular:  Negative for chest pain, palpitations and leg swelling.  Gastrointestinal:  Negative for blood in stool, constipation, diarrhea, nausea and vomiting.  Endocrine: Negative for cold intolerance, heat intolerance and  polyuria.  Genitourinary:  Positive for menstrual problem. Negative for dyspareunia, dysuria, flank pain, frequency, genital sores, hematuria, pelvic pain, urgency, vaginal bleeding, vaginal discharge and vaginal pain.  Musculoskeletal:  Negative for back pain, joint swelling and myalgias.  Skin:  Negative for rash.  Neurological:  Negative for dizziness, syncope, light-headedness, numbness and headaches.  Hematological:  Negative for adenopathy.  Psychiatric/Behavioral:  Negative for agitation, confusion, sleep disturbance and suicidal ideas. The patient is not nervous/anxious.    Objective: BP 120/80   Ht 5' 5" (1.651 m)   Wt 298 lb (135.2 kg)   LMP 05/25/2021 (Exact Date)   BMI 49.59 kg/m    Physical Exam Constitutional:      Appearance: She is well-developed.  Genitourinary:     Vulva normal.     Right Labia: No rash, tenderness or lesions.    Left Labia: No tenderness, lesions or rash.    No vaginal discharge, erythema or tenderness.      Right Adnexa: not tender and no mass present.    Left Adnexa: not tender and no mass present.    No cervical motion tenderness, friability or polyp.     IUD strings visualized.     Uterus is not enlarged or tender.  Breasts:    Right: No mass, nipple discharge, skin change or tenderness.     Left: No mass, nipple discharge, skin change or tenderness.  Neck:     Thyroid: No thyromegaly.  Cardiovascular:     Rate and Rhythm: Normal rate and regular rhythm.     Heart sounds: Normal heart sounds. No murmur  heard. Pulmonary:     Effort: Pulmonary effort is normal.     Breath sounds: Normal breath sounds.  Abdominal:     Palpations: Abdomen is soft.     Tenderness: There is no abdominal tenderness. There is no guarding or rebound.  Musculoskeletal:        General: Normal range of motion.     Cervical back: Normal range of motion.  Lymphadenopathy:     Cervical: No cervical adenopathy.  Neurological:     General: No focal deficit present.     Mental Status: She is alert and oriented to person, place, and time.     Cranial Nerves: No cranial nerve deficit.  Skin:    General: Skin is warm and dry.  Psychiatric:        Mood and Affect: Mood normal.        Behavior: Behavior normal.        Thought Content: Thought content normal.        Judgment: Judgment normal.  Vitals reviewed.    Assessment/Plan: Encounter for annual routine gynecological examination  Menorrhagia with regular cycle--pt would like another Mirena. RTO with MD for insertion with menses/Rx cytotec. Currently on blood thinners.   Encounter for initial prescription of intrauterine contraceptive device (IUD) - Plan: misoprostol (CYTOTEC) 100 MCG tablet; Mirena cat 2 per CDC for heart disease. Pt to confirm Mirena ok with cardio MD. If so, RTO with menses for insertion.   Hx of non-ST elevation myocardial infarction (NSTEMI)  Uncontrolled type 2 diabetes mellitus with hyperglycemia (HCC)  Meds ordered this encounter  Medications   misoprostol (CYTOTEC) 100 MCG tablet    Sig: Take 1 tablet (100 mcg total) by mouth once for 1 dose. 1 hour before appt    Dispense:  1 tablet    Refill:  0    Order Specific Question:   Supervising Provider  Answer:   Gae Dry [[id number]]               GYN counsel adequate intake of calcium and vitamin D, diet and exercise     F/U  Return in about 1 year (around 06/19/2022).  Alicia B. Copland, PA-C 06/19/2021 11:04 AM

## 2021-06-19 NOTE — Patient Instructions (Signed)
Patient Instructions  Patient Details  Name: Holly Garner MRN: [id number] Date of Birth: Oct 01, 1981 Referring Provider:  Iran Ouch, MD  Below are your personal goals for exercise, nutrition, and risk factors. Our goal is to help you stay on track towards obtaining and maintaining these goals. We will be discussing your progress on these goals with you throughout the program.  Initial Exercise Prescription:  Initial Exercise Prescription - 06/19/21 1000       Date of Initial Exercise RX and Referring Provider   Date 06/19/21    Referring Provider Lorine Bears MD   Primary Cardiologist: Dr. Debbe Odea     Treadmill   MPH 2.5    Grade 1    Minutes 15    METs 3.26      Recumbant Bike   Level 4    RPM 50    Watts 76    Minutes 15    METs 3.5      Elliptical   Level 1    Speed 3    Minutes 15    METs 3      T5 Nustep   Level 3    SPM 80    Minutes 15    METs 3.5      Prescription Details   Frequency (times per week) 3    Duration Progress to 30 minutes of continuous aerobic without signs/symptoms of physical distress      Intensity   THRR 40-80% of Max Heartrate 116-161    Ratings of Perceived Exertion 11-13    Perceived Dyspnea 0-4      Progression   Progression Continue to progress workloads to maintain intensity without signs/symptoms of physical distress.      Resistance Training   Training Prescription Yes    Weight 4 lb    Reps 10-15             Exercise Goals: Frequency: Be able to perform aerobic exercise two to three times per week in program working toward 2-5 days per week of home exercise.  Intensity: Work with a perceived exertion of 11 (fairly light) - 15 (hard) while following your exercise prescription.  We will make changes to your prescription with you as you progress through the program.   Duration: Be able to do 30 to 45 minutes of continuous aerobic exercise in addition to a 5 minute warm-up and a 5 minute  cool-down routine.   Nutrition Goals: Your personal nutrition goals will be established when you do your nutrition analysis with the dietician.  The following are general nutrition guidelines to follow: Cholesterol < 200mg /day Sodium < 1500mg /day Fiber: Women under 50 yrs - 25 grams per day  Personal Goals:  Personal Goals and Risk Factors at Admission - 06/19/21 1022       Core Components/Risk Factors/Patient Goals on Admission    Weight Management Yes;Weight Loss;Obesity    Intervention Weight Management: Develop a combined nutrition and exercise program designed to reach desired caloric intake, while maintaining appropriate intake of nutrient and fiber, sodium and fats, and appropriate energy expenditure required for the weight goal.;Weight Management: Provide education and appropriate resources to help participant work on and attain dietary goals.;Weight Management/Obesity: Establish reasonable short term and long term weight goals.;Obesity: Provide education and appropriate resources to help participant work on and attain dietary goals.    Admit Weight 297 lb 6.4 oz (134.9 kg)    Goal Weight: Short Term 290 lb (131.5 kg)    Goal  Weight: Long Term 280 lb (127 kg)    Expected Outcomes Short Term: Continue to assess and modify interventions until short term weight is achieved;Long Term: Adherence to nutrition and physical activity/exercise program aimed toward attainment of established weight goal;Weight Loss: Understanding of general recommendations for a balanced deficit meal plan, which promotes 1-2 lb weight loss per week and includes a negative energy balance of [phone] kcal/d;Understanding recommendations for meals to include 15-35% energy as protein, 25-35% energy from fat, 35-60% energy from carbohydrates, less than 200mg  of dietary cholesterol, 20-35 gm of total fiber daily;Understanding of distribution of calorie intake throughout the day with the consumption of 4-5 meals/snacks     Tobacco Cessation Yes   quit 05/03/21   Expected Outcomes Short Term: Will quit all tobacco product use, adhering to prevention of relapse plan.;Long Term: Complete abstinence from all tobacco products for at least 12 months from quit date.    Diabetes Yes    Intervention Provide education about signs/symptoms and action to take for hypo/hyperglycemia.;Provide education about proper nutrition, including hydration, and aerobic/resistive exercise prescription along with prescribed medications to achieve blood glucose in normal ranges: Fasting glucose 65-99 mg/dL    Expected Outcomes Short Term: Participant verbalizes understanding of the signs/symptoms and immediate care of hyper/hypoglycemia, proper foot care and importance of medication, aerobic/resistive exercise and nutrition plan for blood glucose control.;Long Term: Attainment of HbA1C < 7%.    Hypertension Yes    Intervention Provide education on lifestyle modifcations including regular physical activity/exercise, weight management, moderate sodium restriction and increased consumption of fresh fruit, vegetables, and low fat dairy, alcohol moderation, and smoking cessation.;Monitor prescription use compliance.    Expected Outcomes Short Term: Continued assessment and intervention until BP is < 140/23mm HG in hypertensive participants. < 130/29mm HG in hypertensive participants with diabetes, heart failure or chronic kidney disease.;Long Term: Maintenance of blood pressure at goal levels.    Lipids Yes    Intervention Provide education and support for participant on nutrition & aerobic/resistive exercise along with prescribed medications to achieve LDL 70mg , HDL >40mg .    Expected Outcomes Short Term: Participant states understanding of desired cholesterol values and is compliant with medications prescribed. Participant is following exercise prescription and nutrition guidelines.;Long Term: Cholesterol controlled with medications as prescribed, with  individualized exercise RX and with personalized nutrition plan. Value goals: LDL < 70mg , HDL > 40 mg.             Tobacco Use Initial Evaluation: Social History   Tobacco Use  Smoking Status Former   Packs/day: 1.00   Years: 10.00   Pack years: 10.00   Types: Cigarettes   Quit date: 05/03/2021   Years since quitting: 0.1  Smokeless Tobacco Never    Exercise Goals and Review:  Exercise Goals     Row Name 06/19/21 1017             Exercise Goals   Increase Strength and Stamina Yes       Intervention Provide advice, education, support and counseling about physical activity/exercise needs.;Develop an individualized exercise prescription for aerobic and resistive training based on initial evaluation findings, risk stratification, comorbidities and participant's personal goals.       Expected Outcomes Short Term: Increase workloads from initial exercise prescription for resistance, speed, and METs.;Short Term: Perform resistance training exercises routinely during rehab and add in resistance training at home;Long Term: Improve cardiorespiratory fitness, muscular endurance and strength as measured by increased METs and functional capacity (05/05/2021)  Able to understand and use rate of perceived exertion (RPE) scale Yes       Intervention Provide education and explanation on how to use RPE scale       Expected Outcomes Short Term: Able to use RPE daily in rehab to express subjective intensity level;Long Term:  Able to use RPE to guide intensity level when exercising independently       Able to understand and use Dyspnea scale Yes       Intervention Provide education and explanation on how to use Dyspnea scale       Expected Outcomes Long Term: Able to use Dyspnea scale to guide intensity level when exercising independently;Short Term: Able to use Dyspnea scale daily in rehab to express subjective sense of shortness of breath during exertion       Knowledge and understanding of  Target Heart Rate Range (THRR) Yes       Intervention Provide education and explanation of THRR including how the numbers were predicted and where they are located for reference       Expected Outcomes Short Term: Able to state/look up THRR;Short Term: Able to use daily as guideline for intensity in rehab;Long Term: Able to use THRR to govern intensity when exercising independently       Able to check pulse independently Yes       Intervention Provide education and demonstration on how to check pulse in carotid and radial arteries.;Review the importance of being able to check your own pulse for safety during independent exercise       Expected Outcomes Short Term: Able to explain why pulse checking is important during independent exercise;Long Term: Able to check pulse independently and accurately       Understanding of Exercise Prescription Yes       Intervention Provide education, explanation, and written materials on patient's individual exercise prescription       Expected Outcomes Short Term: Able to explain program exercise prescription;Long Term: Able to explain home exercise prescription to exercise independently                Copy of goals given to participant.

## 2021-06-19 NOTE — Progress Notes (Signed)
Cardiac Individual Treatment Plan  Patient Details  Name: Holly Garner MRN: [id number] Date of Birth: 20-Jul-1981 Referring Provider:   Flowsheet Row Cardiac Rehab from 06/19/2021 in [facility] Cardiac and Pulmonary Rehab  Referring Provider Kathlyn Sacramento MD  [facility] Cardiologist: Dr. Aaron Edelman Agbor-Etang]       Initial Encounter Date:  Flowsheet Row Cardiac Rehab from 06/19/2021 in [facility] Cardiac and Pulmonary Rehab  Date 06/19/21       Visit Diagnosis: NSTEMI (non-ST elevated myocardial infarction) [facility])  Status post coronary artery stent placement  Patient's Home Medications on Admission:  Current Outpatient Medications:    ACCU-CHEK GUIDE test strip, USE TO TEST BLOOD SUGARS UP TO FOUR TIMES DAILY, Disp: 100 each, Rfl: 1   Accu-Chek Softclix Lancets lancets, USE TO TEST BLOOD SUGARS UP TO FOUR TIMES DAILY, Disp: 100 each, Rfl: 1   aspirin 81 MG chewable tablet, Chew 1 tablet (81 mg total) by mouth daily., Disp: 30 tablet, Rfl: 0   atorvastatin (LIPITOR) 80 MG tablet, Take 1 tablet (80 mg total) by mouth daily., Disp: 90 tablet, Rfl: 0   blood glucose meter kit and supplies KIT, Dispense based on patient and insurance preference. Use up to four times daily as directed., Disp: 1 each, Rfl: 0   Continuous Blood Gluc Sensor (FREESTYLE LIBRE 2 SENSOR) MISC, 1 each by Does not apply route as directed., Disp: 2 each, Rfl: 2   dapagliflozin propanediol (FARXIGA) 5 MG TABS tablet, Take 1 tablet (5 mg total) by mouth daily before breakfast., Disp: 30 tablet, Rfl: 2   EPINEPHrine (EPIPEN 2-PAK) 0.3 mg/0.3 mL IJ SOAJ injection, Inject 0.3 mLs (0.3 mg total) into the muscle once., Disp: 2 Device, Rfl: 0   insulin glargine (LANTUS SOLOSTAR) 100 UNIT/ML Solostar Pen, Inject 40 Units into the skin daily. STOP levemir, Disp: 15 mL, Rfl: 1   Insulin Pen Needle (PEN NEEDLES) 30G X 8 MM MISC, 1 each by Does not apply route daily., Disp: 100 each, Rfl: 3   losartan (COZAAR) 25 MG tablet, Take 1 tablet  (25 mg total) by mouth daily., Disp: 90 tablet, Rfl: 0   metFORMIN (GLUCOPHAGE) 1000 MG tablet, Take 1,000 mg by mouth 2 (two) times daily with a meal., Disp: , Rfl:    metoprolol succinate (TOPROL-XL) 25 MG 24 hr tablet, Take 1 tablet (25 mg total) by mouth daily., Disp: 90 tablet, Rfl: 0   predniSONE (DELTASONE) 10 MG tablet, Take 2 tablets (20 mg total) by mouth daily as needed., Disp: 4 tablet, Rfl: 0   PROAIR HFA 108 (90 Base) MCG/ACT inhaler, Inhale 1-2 puffs into the lungs every 6 (six) hours as needed for wheezing or shortness of breath., Disp: 8.5 g, Rfl: 0   ticagrelor (BRILINTA) 90 MG TABS tablet, Take 1 tablet (90 mg total) by mouth 2 (two) times daily., Disp: 180 tablet, Rfl: 0  Past Medical History: Past Medical History:  Diagnosis Date   BMI 50.0-59.9, adult ([place])    CAD (coronary artery disease)    a. 04/2021 NSTEMI/PCI: LM nl, LAD 74m 30d, D1 nl, LCX 69mOM1 min irregs, RCA 20p, 30p/m, 100d (2.5x26 Resolute Onyx DES).   Essential hypertension    History of echocardiogram    a. 04/2021 Echo: EF 60-65%. No rwma. Nl RV fxn.   History of Papanicolaou smear of cervix 04/2011   normal per pt.   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   hgsil   Tobacco abuse    Type II diabetes mellitus (  [place])     Tobacco Use: Social History   Tobacco Use  Smoking Status Former   Packs/day: 1.00   Years: 10.00   Pack years: 10.00   Types: Cigarettes   Quit date: 05/03/2021   Years since quitting: 0.1  Smokeless Tobacco Never    Labs: Recent Review Flowsheet Data     Labs for ITP Cardiac and Pulmonary Rehab Latest Ref Rng & Units 04/28/2019 07/23/2019 05/03/2021 05/04/2021 06/15/2021   Cholestrol 100 - 199 mg/dL 226(H) - - 193 163   LDLCALC 0 - 99 mg/dL 160(H) - - 130(H) 105(H)   HDL >39 mg/dL 29.40(L) - - 26(L) 36(L)   Trlycerides 0 - 149 mg/dL 185.0(H) - - 184(H) 123   Hemoglobin A1c 4.8 - 5.6 % - 8.5(A) 10.8(H) - -        Exercise Target Goals: Exercise Program  Goal: Individual exercise prescription set using results from initial 6 min walk test and THRR while considering  patient's activity barriers and safety.   Exercise Prescription Goal: Initial exercise prescription builds to 30-45 minutes a day of aerobic activity, 2-3 days per week.  Home exercise guidelines will be given to patient during program as part of exercise prescription that the participant will acknowledge.   Education: Aerobic Exercise: - Group verbal and visual presentation on the components of exercise prescription. Introduces F.I.T.T principle from ACSM for exercise prescriptions.  Reviews F.I.T.T. principles of aerobic exercise including progression. Written material given at graduation. Flowsheet Row Cardiac Rehab from 06/19/2021 in [facility] Cardiac and Pulmonary Rehab  Education need identified 06/19/21       Education: Resistance Exercise: - Group verbal and visual presentation on the components of exercise prescription. Introduces F.I.T.T principle from ACSM for exercise prescriptions  Reviews F.I.T.T. principles of resistance exercise including progression. Written material given at graduation.    Education: Exercise & Equipment Safety: - Individual verbal instruction and demonstration of equipment use and safety with use of the equipment. Flowsheet Row Cardiac Rehab from 06/19/2021 in [facility] Cardiac and Pulmonary Rehab  Date 06/19/21  Educator [facility]  Instruction Review Code 1- Verbalizes Understanding       Education: Exercise Physiology & General Exercise Guidelines: - Group verbal and written instruction with models to review the exercise physiology of the cardiovascular system and associated critical values. Provides general exercise guidelines with specific guidelines to those with heart or lung disease.    Education: Flexibility, Balance, Mind/Body Relaxation: - Group verbal and visual presentation with interactive activity on the components of exercise prescription.  Introduces F.I.T.T principle from ACSM for exercise prescriptions. Reviews F.I.T.T. principles of flexibility and balance exercise training including progression. Also discusses the mind body connection.  Reviews various relaxation techniques to help reduce and manage stress (i.e. Deep breathing, progressive muscle relaxation, and visualization). Balance handout provided to take home. Written material given at graduation.   Activity Barriers & Risk Stratification:  Activity Barriers & Cardiac Risk Stratification - 06/19/21 0946       Activity Barriers & Cardiac Risk Stratification   Activity Barriers Deconditioning;Muscular Weakness    Cardiac Risk Stratification Moderate             6 Minute Walk:  6 Minute Walk     Row Name 06/19/21 0940         6 Minute Walk   Phase Initial     Distance 1130 feet     Walk Time 6 minutes     # of Rest Breaks 0  MPH 2.14     METS 3.78     RPE 7     VO2 Peak 13.21     Symptoms No     Resting HR 72 bpm     Resting BP 146/76     Resting Oxygen Saturation  98 %     Exercise Oxygen Saturation  during 6 min walk 96 %     Max Ex. HR 131 bpm     Max Ex. BP 156/76     2 Minute Post BP 126/64              Oxygen Initial Assessment:   Oxygen Re-Evaluation:   Oxygen Discharge (Final Oxygen Re-Evaluation):   Initial Exercise Prescription:  Initial Exercise Prescription - 06/19/21 1000       Date of Initial Exercise RX and Referring Provider   Date 06/19/21    Referring Provider Kathlyn Sacramento MD   Primary Cardiologist: Dr. Kate Sable     Treadmill   MPH 2.5    Grade 1    Minutes 15    METs 3.26      Recumbant Bike   Level 4    RPM 50    Watts 76    Minutes 15    METs 3.5      Elliptical   Level 1    Speed 3    Minutes 15    METs 3      T5 Nustep   Level 3    SPM 80    Minutes 15    METs 3.5      Prescription Details   Frequency (times per week) 3    Duration Progress to 30 minutes of  continuous aerobic without signs/symptoms of physical distress      Intensity   THRR 40-80% of Max Heartrate 116-161    Ratings of Perceived Exertion 11-13    Perceived Dyspnea 0-4      Progression   Progression Continue to progress workloads to maintain intensity without signs/symptoms of physical distress.      Resistance Training   Training Prescription Yes    Weight 4 lb    Reps 10-15             Perform Capillary Blood Glucose checks as needed.  Exercise Prescription Changes:   Exercise Prescription Changes     Row Name 06/19/21 1000             Response to Exercise   Blood Pressure (Admit) 146/76       Blood Pressure (Exercise) 156/78       Blood Pressure (Exit) 126/64       Heart Rate (Admit) 72 bpm       Heart Rate (Exercise) 131 bpm       Heart Rate (Exit) 75 bpm       Oxygen Saturation (Admit) 98 %       Oxygen Saturation (Exercise) 96 %       Rating of Perceived Exertion (Exercise) 7       Symptoms none       Comments walk test results                Exercise Comments:   Exercise Goals and Review:   Exercise Goals     Row Name 06/19/21 1017             Exercise Goals   Increase Strength and Stamina Yes       Intervention Provide advice,  education, support and counseling about physical activity/exercise needs.;Develop an individualized exercise prescription for aerobic and resistive training based on initial evaluation findings, risk stratification, comorbidities and participant's personal goals.       Expected Outcomes Short Term: Increase workloads from initial exercise prescription for resistance, speed, and METs.;Short Term: Perform resistance training exercises routinely during rehab and add in resistance training at home;Long Term: Improve cardiorespiratory fitness, muscular endurance and strength as measured by increased METs and functional capacity (6MWT)       Able to understand and use rate of perceived exertion (RPE) scale Yes        Intervention Provide education and explanation on how to use RPE scale       Expected Outcomes Short Term: Able to use RPE daily in rehab to express subjective intensity level;Long Term:  Able to use RPE to guide intensity level when exercising independently       Able to understand and use Dyspnea scale Yes       Intervention Provide education and explanation on how to use Dyspnea scale       Expected Outcomes Long Term: Able to use Dyspnea scale to guide intensity level when exercising independently;Short Term: Able to use Dyspnea scale daily in rehab to express subjective sense of shortness of breath during exertion       Knowledge and understanding of Target Heart Rate Range (THRR) Yes       Intervention Provide education and explanation of THRR including how the numbers were predicted and where they are located for reference       Expected Outcomes Short Term: Able to state/look up THRR;Short Term: Able to use daily as guideline for intensity in rehab;Long Term: Able to use THRR to govern intensity when exercising independently       Able to check pulse independently Yes       Intervention Provide education and demonstration on how to check pulse in carotid and radial arteries.;Review the importance of being able to check your own pulse for safety during independent exercise       Expected Outcomes Short Term: Able to explain why pulse checking is important during independent exercise;Long Term: Able to check pulse independently and accurately       Understanding of Exercise Prescription Yes       Intervention Provide education, explanation, and written materials on patient's individual exercise prescription       Expected Outcomes Short Term: Able to explain program exercise prescription;Long Term: Able to explain home exercise prescription to exercise independently                Exercise Goals Re-Evaluation :   Discharge Exercise Prescription (Final Exercise Prescription  Changes):  Exercise Prescription Changes - 06/19/21 1000       Response to Exercise   Blood Pressure (Admit) 146/76    Blood Pressure (Exercise) 156/78    Blood Pressure (Exit) 126/64    Heart Rate (Admit) 72 bpm    Heart Rate (Exercise) 131 bpm    Heart Rate (Exit) 75 bpm    Oxygen Saturation (Admit) 98 %    Oxygen Saturation (Exercise) 96 %    Rating of Perceived Exertion (Exercise) 7    Symptoms none    Comments walk test results             Nutrition:  Target Goals: Understanding of nutrition guidelines, daily intake of sodium <1573m, cholesterol <2056m calories 30% from fat and 7% or less from saturated fats,  daily to have 5 or more servings of fruits and vegetables.  Education: All About Nutrition: -Group instruction provided by verbal, written material, interactive activities, discussions, models, and posters to present general guidelines for heart healthy nutrition including fat, fiber, MyPlate, the role of sodium in heart healthy nutrition, utilization of the nutrition label, and utilization of this knowledge for meal planning. Follow up email sent as well. Written material given at graduation. Flowsheet Row Cardiac Rehab from 06/19/2021 in [facility] Cardiac and Pulmonary Rehab  Education need identified 06/19/21       Biometrics:  Pre Biometrics - 06/19/21 1017       Pre Biometrics   Height 5' 6"  (1.676 m)    Weight 297 lb 6.4 oz (134.9 kg)    BMI (Calculated) 48.02    Single Leg Stand 30 seconds              Nutrition Therapy Plan and Nutrition Goals:  Nutrition Therapy & Goals - 06/19/21 1019       Intervention Plan   Intervention Prescribe, educate and counsel regarding individualized specific dietary modifications aiming towards targeted core components such as weight, hypertension, lipid management, diabetes, heart failure and other comorbidities.    Expected Outcomes Short Term Goal: Understand basic principles of dietary content, such as calories,  fat, sodium, cholesterol and nutrients.;Long Term Goal: Adherence to prescribed nutrition plan.;Short Term Goal: A plan has been developed with personal nutrition goals set during dietitian appointment.             Nutrition Assessments:  MEDIFICTS Score Key: ?70 Need to make dietary changes  40-70 Heart Healthy Diet ? 40 Therapeutic Level Cholesterol Diet  Flowsheet Row Cardiac Rehab from 06/19/2021 in [facility] Cardiac and Pulmonary Rehab  Picture Your Plate Total Score on Admission 75      Picture Your Plate Scores: <67 Unhealthy dietary pattern with much room for improvement. 41-50 Dietary pattern unlikely to meet recommendations for good health and room for improvement. 51-60 More healthful dietary pattern, with some room for improvement.  >60 Healthy dietary pattern, although there may be some specific behaviors that could be improved.    Nutrition Goals Re-Evaluation:   Nutrition Goals Discharge (Final Nutrition Goals Re-Evaluation):   Psychosocial: Target Goals: Acknowledge presence or absence of significant depression and/or stress, maximize coping skills, provide positive support system. Participant is able to verbalize types and ability to use techniques and skills needed for reducing stress and depression.   Education: Stress, Anxiety, and Depression - Group verbal and visual presentation to define topics covered.  Reviews how body is impacted by stress, anxiety, and depression.  Also discusses healthy ways to reduce stress and to treat/manage anxiety and depression.  Written material given at graduation.   Education: Sleep Hygiene -Provides group verbal and written instruction about how sleep can affect your health.  Define sleep hygiene, discuss sleep cycles and impact of sleep habits. Review good sleep hygiene tips.    Initial Review & Psychosocial Screening:  Initial Psych Review & Screening - 05/26/21 1410       Initial Review   Current issues with Current  Sleep Concerns;Current Stress Concerns    Source of Stress Concerns Chronic Illness      Family Dynamics   Good Support System? Yes   husband, kids, parents     Barriers   Psychosocial barriers to participate in program There are no identifiable barriers or psychosocial needs.;The patient should benefit from training in stress management and relaxation.  Screening Interventions   Interventions Encouraged to exercise;Provide feedback about the scores to participant;To provide support and resources with identified psychosocial needs    Expected Outcomes Short Term goal: Utilizing psychosocial counselor, staff and physician to assist with identification of specific Stressors or current issues interfering with healing process. Setting desired goal for each stressor or current issue identified.;Long Term Goal: Stressors or current issues are controlled or eliminated.;Short Term goal: Identification and review with participant of any Quality of Life or Depression concerns found by scoring the questionnaire.;Long Term goal: The participant improves quality of Life and PHQ9 Scores as seen by post scores and/or verbalization of changes             Quality of Life Scores:   Quality of Life - 06/19/21 1018       Quality of Life   Select Quality of Life      Quality of Life Scores   Health/Function Pre 29.6 %    Socioeconomic Pre 30 %    Psych/Spiritual Pre 30 %    Family Pre 30 %    GLOBAL Pre 29.82 %            Scores of 19 and below usually indicate a poorer quality of life in these areas.  A difference of  2-3 points is a clinically meaningful difference.  A difference of 2-3 points in the total score of the Quality of Life Index has been associated with significant improvement in overall quality of life, self-image, physical symptoms, and general health in studies assessing change in quality of life.  PHQ-9: Recent Review Flowsheet Data     Depression screen [facility] 2/9 06/19/2021  05/24/2021 03/24/2019   Decreased Interest 0 0 0   Down, Depressed, Hopeless 0 0 0   PHQ - 2 Score 0 0 0   Altered sleeping 2 - -   Tired, decreased energy 1 - -   Change in appetite 0 - -   Feeling bad or failure about yourself  0 - -   Trouble concentrating 0 - -   Moving slowly or fidgety/restless 0 - -   Suicidal thoughts 0 - -   PHQ-9 Score 3 - -   Difficult doing work/chores Not difficult at all - -      Interpretation of Total Score  Total Score Depression Severity:  1-4 = Minimal depression, 5-9 = Mild depression, 10-14 = Moderate depression, 15-19 = Moderately severe depression, 20-27 = Severe depression   Psychosocial Evaluation and Intervention:  Psychosocial Evaluation - 05/26/21 1423       Psychosocial Evaluation & Interventions   Interventions Stress management education;Encouraged to exercise with the program and follow exercise prescription    Comments Jahniyah is coming to cardiac rehab bost MI and stents. While she was in the hospital, she was diagnosed with diabetes. She is seeing a diabetes educator to learn more about living with diabetes. She also quit smoking when she was admitted and so did her husband. Together, they both feel encouraged to keep each other accountable. She thinks her MI was exacerbated by her son's friend's car accident that killed 3 people. She found out later that her son was supposed to be in the car, so although she is very thankful he was not it is stressful to think about and going through the grieving process for those teens. She states her husband, parents, and children are her main support system and they have been very present during her recovery. 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 She is excited about starting cardiac rehab to learn more about heart healthy living, including managing her diabetes and risk factors.    Expected Outcomes Short: attend cardiac rehab  for education and exercise. Long: develop and maintain positive self care habits.    Continue Psychosocial Services  Follow up required by staff             Psychosocial Re-Evaluation:   Psychosocial Discharge (Final Psychosocial Re-Evaluation):   Vocational Rehabilitation: Provide vocational rehab assistance to qualifying candidates.   Vocational Rehab Evaluation & Intervention:  Vocational Rehab - 05/26/21 1413       Initial Vocational Rehab Evaluation & Intervention   Assessment shows need for Vocational Rehabilitation No             Education: Education Goals: Education classes will be provided on a variety of topics geared toward better understanding of heart health and risk factor modification. Participant will state understanding/return demonstration of topics presented as noted by education test scores.  Learning Barriers/Preferences:  Learning Barriers/Preferences - 05/26/21 1413       Learning Barriers/Preferences   Learning Barriers None    Learning Preferences None             General Cardiac Education Topics:  AED/CPR: - Group verbal and written instruction with the use of models to demonstrate the basic use of the AED with the basic ABC's of resuscitation.   Anatomy and Cardiac Procedures: - Group verbal and visual presentation and models provide information about basic cardiac anatomy and function. Reviews the testing methods done to diagnose heart disease and the outcomes of the test results. Describes the treatment choices: Medical Management, Angioplasty, or Coronary Bypass Surgery for treating various heart conditions including Myocardial Infarction, Angina, Valve Disease, and Cardiac Arrhythmias.  Written material given at graduation.   Medication Safety: - Group verbal and visual instruction to review commonly prescribed medications for heart and lung disease. Reviews the medication, class of the drug, and side effects. Includes the  steps to properly store meds and maintain the prescription regimen.  Written material given at graduation.   Intimacy: - Group verbal instruction through game format to discuss how heart and lung disease can affect sexual intimacy. Written material given at graduation..   Know Your Numbers and Heart Failure: - Group verbal and visual instruction to discuss disease risk factors for cardiac and pulmonary disease and treatment options.  Reviews associated critical values for Overweight/Obesity, Hypertension, Cholesterol, and Diabetes.  Discusses basics of heart failure: signs/symptoms and treatments.  Introduces Heart Failure Zone chart for action plan for heart failure.  Written material given at graduation. Flowsheet Row Cardiac Rehab from 06/19/2021 in [facility] Cardiac and Pulmonary Rehab  Education need identified 06/19/21       Infection Prevention: - Provides verbal and written material to individual with discussion of infection control including proper hand washing and proper equipment cleaning during exercise session. Flowsheet Row Cardiac Rehab from 06/19/2021 in [facility] Cardiac and Pulmonary Rehab  Date 06/19/21  Educator [facility]  Instruction Review Code 1- Verbalizes Understanding       Falls Prevention: - Provides verbal and written material to individual with discussion of falls prevention and safety. Flowsheet Row Cardiac Rehab from 06/19/2021 in [facility] Cardiac and Pulmonary Rehab  Date 06/19/21  Educator [facility]  Instruction Review Code 1- Verbalizes Understanding  Other: -Provides group and verbal instruction on various topics (see comments)   Knowledge Questionnaire Score:  Knowledge Questionnaire Score - 06/19/21 1019       Knowledge Questionnaire Score   Pre Score 22/26 Education: Angina, exericse, nutrition, PAD             Core Components/Risk Factors/Patient Goals at Admission:  Personal Goals and Risk Factors at Admission - 06/19/21 1022       Core  Components/Risk Factors/Patient Goals on Admission    Weight Management Yes;Weight Loss;Obesity    Intervention Weight Management: Develop a combined nutrition and exercise program designed to reach desired caloric intake, while maintaining appropriate intake of nutrient and fiber, sodium and fats, and appropriate energy expenditure required for the weight goal.;Weight Management: Provide education and appropriate resources to help participant work on and attain dietary goals.;Weight Management/Obesity: Establish reasonable short term and long term weight goals.;Obesity: Provide education and appropriate resources to help participant work on and attain dietary goals.    Admit Weight 297 lb 6.4 oz (134.9 kg)    Goal Weight: Short Term 290 lb (131.5 kg)    Goal Weight: Long Term 280 lb (127 kg)    Expected Outcomes Short Term: Continue to assess and modify interventions until short term weight is achieved;Long Term: Adherence to nutrition and physical activity/exercise program aimed toward attainment of established weight goal;Weight Loss: Understanding of general recommendations for a balanced deficit meal plan, which promotes 1-2 lb weight loss per week and includes a negative energy balance of [phone] kcal/d;Understanding recommendations for meals to include 15-35% energy as protein, 25-35% energy from fat, 35-60% energy from carbohydrates, less than 218m of dietary cholesterol, 20-35 gm of total fiber daily;Understanding of distribution of calorie intake throughout the day with the consumption of 4-5 meals/snacks    Tobacco Cessation Yes   quit 05/03/21   Expected Outcomes Short Term: Will quit all tobacco product use, adhering to prevention of relapse plan.;Long Term: Complete abstinence from all tobacco products for at least 12 months from quit date.    Diabetes Yes    Intervention Provide education about signs/symptoms and action to take for hypo/hyperglycemia.;Provide education about proper  nutrition, including hydration, and aerobic/resistive exercise prescription along with prescribed medications to achieve blood glucose in normal ranges: Fasting glucose 65-99 mg/dL    Expected Outcomes Short Term: Participant verbalizes understanding of the signs/symptoms and immediate care of hyper/hypoglycemia, proper foot care and importance of medication, aerobic/resistive exercise and nutrition plan for blood glucose control.;Long Term: Attainment of HbA1C < 7%.    Hypertension Yes    Intervention Provide education on lifestyle modifcations including regular physical activity/exercise, weight management, moderate sodium restriction and increased consumption of fresh fruit, vegetables, and low fat dairy, alcohol moderation, and smoking cessation.;Monitor prescription use compliance.    Expected Outcomes Short Term: Continued assessment and intervention until BP is < 140/945mHG in hypertensive participants. < 130/8071mG in hypertensive participants with diabetes, heart failure or chronic kidney disease.;Long Term: Maintenance of blood pressure at goal levels.    Lipids Yes    Intervention Provide education and support for participant on nutrition & aerobic/resistive exercise along with prescribed medications to achieve LDL <66m4mDL >40mg28m Expected Outcomes Short Term: Participant states understanding of desired cholesterol values and is compliant with medications prescribed. Participant is following exercise prescription and nutrition guidelines.;Long Term: Cholesterol controlled with medications as prescribed, with individualized exercise RX and with personalized nutrition plan. Value goals: LDL < 66mg,76m >  40 mg.             Education:Diabetes - Individual verbal and written instruction to review signs/symptoms of diabetes, desired ranges of glucose level fasting, after meals and with exercise. Acknowledge that pre and post exercise glucose checks will be done for 3 sessions at entry of  program. [place] from 06/19/2021 in [facility]  Date 05/26/21  Educator [facility]  Instruction Review Code 1- Verbalizes Understanding       Core Components/Risk Factors/Patient Goals Review:    Core Components/Risk Factors/Patient Goals at Discharge (Final Review):    ITP Comments:  ITP Comments     Row Name 05/26/21 1429 06/19/21 0939         ITP Comments Initial telephone orientation completed. Diagnosis can be found in [facility] 05/04/21. EP orientation scheduled for Thursday 8/11 at 8am. Completed 6MWT and gym orientation. Initial ITP created and sent for review to Dr. Emily Filbert, Medical Director.               Comments: Initial ITP

## 2021-06-19 NOTE — Patient Instructions (Signed)
I value your feedback and you entrusting us with your care. If you get a [facility] patient survey, I would appreciate you taking the time to let us know about your experience today. Thank you! ? ? ?

## 2021-06-22 ENCOUNTER — Other Ambulatory Visit: Payer: Self-pay | Admitting: *Deleted

## 2021-06-22 ENCOUNTER — Ambulatory Visit: Payer: Medicaid Other | Admitting: Nurse Practitioner

## 2021-06-22 MED ORDER — EZETIMIBE 10 MG PO TABS: 10.0000 mg | ORAL_TABLET | Freq: Every day | ORAL | 3 refills | Status: DC

## 2021-06-28 ENCOUNTER — Encounter: Payer: Self-pay | Admitting: *Deleted

## 2021-06-28 ENCOUNTER — Other Ambulatory Visit: Payer: Self-pay | Admitting: Nurse Practitioner

## 2021-06-28 ENCOUNTER — Other Ambulatory Visit: Payer: Self-pay | Admitting: Family Medicine

## 2021-06-28 DIAGNOSIS — Z955 Presence of coronary angioplasty implant and graft: Secondary | ICD-10-CM

## 2021-06-28 DIAGNOSIS — E119 Type 2 diabetes mellitus without complications: Secondary | ICD-10-CM

## 2021-06-28 DIAGNOSIS — I214 Non-ST elevation (NSTEMI) myocardial infarction: Secondary | ICD-10-CM

## 2021-06-28 NOTE — Progress Notes (Signed)
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 female here for joint pains with hand swelling and elevated CRP. 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 She had a history of former smoking, obesity, and diabetes but no prior known cardiovascular disease. Symptoms started since around 2016 and initial emergency department visit after a puncture wound to her hand while gardening from a [place] thistle thorn.  However since that time she has had continued ongoing intermittent skin rashes in multiple areas that are red slightly raised wheals typically lasting a few days at a time but very responsive to oral prednisone.  These have occurred throughout her whole body often seem to be spontaneous she also describes rashes being provoked with pressure to specific areas such as receiving a firm massage resulting in back rash or lying on a particular extremity provoking an arm or leg rash hours after the original pressure.  There is frequently some swelling and joint pains near the involved areas.  Her worst joint pains occur at the knees and feet these can be very severe and when swollen limit her mobility.  There has been no improvement to OTC oral antihistamines.  She had one episode of mouth or throat swelling requiring emergency department visit that was treated with steroids with resolution.  She has had some type of food allergen sensitivity testing and a lot of lab work over several years but denies any skin biopsy or specific allergist evaluation.   Labs reviewed 04/2021 ANA  neg RF neg ESR 29 hsCRP 21.3  Activities of Daily Living:  Patient reports morning stiffness for 2 hours.   Patient Denies nocturnal pain.  Difficulty dressing/grooming: Denies Difficulty climbing stairs: Denies Difficulty getting out of chair: Denies Difficulty using hands for taps, buttons, cutlery, and/or writing: Denies  Review of Systems  Constitutional:  Negative for fatigue.  HENT:  Negative for mouth dryness.   Eyes:  Negative for dryness.  Respiratory:  Negative for shortness of breath.   Cardiovascular:  Positive for swelling in legs/feet.  Gastrointestinal:  Negative for constipation.  Endocrine: Negative for heat intolerance.  Genitourinary:  Negative for difficulty urinating.  Musculoskeletal:  Positive for joint pain, joint pain, joint swelling and morning stiffness.  Skin:  Positive for rash, nodules/bumps and redness.  Allergic/Immunologic: Negative for susceptible to infections.  Neurological:  Negative for numbness.  Hematological:  Positive for bruising/bleeding tendency.  Psychiatric/Behavioral:  Negative for sleep disturbance.    PMFS History:  Patient Active Problem List   Diagnosis Date Noted   Rash and other nonspecific skin eruption 06/29/2021   Chronic arthralgias of knees and hips 06/29/2021   Uncontrolled type 2 diabetes mellitus with hyperglycemia ([place]) 05/04/2021   Chest pain 05/03/2021   Cellulitis of chest wall 05/03/2021   Hx of non-ST elevation myocardial infarction (NSTEMI) 05/03/2021   Sepsis ([place]) 05/03/2021   Tobacco abuse 05/03/2021   Pure hypercholesterolemia    Type  2 diabetes mellitus with other specified complication ([place]) 30/16/0109   HTN (hypertension) 04/07/2019   Swelling 03/24/2019   Morbid obesity ([place]) 03/24/2019   Seasonal allergies 03/24/2019    Past Medical History:  Diagnosis Date   BMI 50.0-59.9, adult ([place])    CAD (coronary artery disease)    a. 04/2021 NSTEMI/PCI: LM nl, LAD 62m 30d, D1 nl, LCX 671mOM1 min  irregs, RCA 20p, 30p/m, 100d (2.5x26 Resolute Onyx DES).   Essential hypertension    High cholesterol    History of echocardiogram    a. 04/2021 Echo: EF 60-65%. No rwma. Nl RV fxn.   History of Papanicolaou smear of cervix 04/2011   normal per pt.   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   hgsil   Tobacco abuse    [place] II diabetes [place]    Family History  Problem Relation Age of Onset   Diabetes Father    Breast cancer Maternal Grandmother        late years   Diabetes Paternal Grandfather    Past Surgical History:  Procedure Laterality Date   COLPOSCOPY  09/28/2009   CORONARY STENT INTERVENTION N/A 05/04/2021   Procedure: CORONARY STENT INTERVENTION;  Surgeon: ArWellington HampshireMD;  [facility];  Service: Cardiovascular;  Laterality: N/A;   LEFT HEART CATH AND CORONARY ANGIOGRAPHY N/A 05/04/2021   Procedure: LEFT HEART CATH AND CORONARY ANGIOGRAPHY;  Surgeon: ArWellington HampshireMD;  [facility];  Service: Cardiovascular;  Laterality: N/A;   TONSILLECTOMY AND ADENOIDECTOMY     TYMPANOSTOMY TUBE PLACEMENT Bilateral    Social History   Social History Narrative   Not on file   Immunization History  Administered Date(s) Administered   Influenza,inj,Quad PF,6+ Mos 07/07/2019   PFIZER(Purple Top)SARS-COV-2 Vaccination 11/22/2020, 12/19/2020     Objective: Vital Signs: BP 115/78 (BP Location: Right Arm, Patient Position: Sitting, Cuff Size: Large)   Pulse 71   Resp 16   Ht _0  (1.651 m)   Wt 299 lb (135.6 kg)   BMI 49.76 kg/m    Physical Exam Constitutional:      Appearance: She is obese.  HENT:     Right Ear: External ear normal.     Left Ear: External ear normal.     Mouth/Throat:     Mouth: Mucous membranes are moist.     Pharynx: Oropharynx is clear.  Eyes:     Conjunctiva/sclera: Conjunctivae normal.  Cardiovascular:     Rate and Rhythm: Normal rate and regular rhythm.  Pulmonary:     Effort:  Pulmonary effort is normal.     Breath sounds: Normal breath sounds.  Skin:    General: Skin is warm and dry.     Comments: Blanching confluent erythematous patch with induration on base of neck about 4 to 5 cm diameter Fainter erythematous rash on flexor surface in left elbow, linear loose groupings of 1 cm or less diameter wheals down forearm flexor surfaces and on lateral surface of left knee Mild left thigh bruising present  Neurological:     Mental Status: She is alert.     Deep Tendon Reflexes: Reflexes normal.  Psychiatric:        Mood and Affect: Mood normal.     Musculoskeletal Exam:  Neck full ROM no tenderness Shoulders full ROM no tenderness or swelling Elbows full ROM no tenderness or swelling Wrists full ROM no tenderness or swelling Fingers full ROM no tenderness  or swelling Knees full ROM no tenderness or swelling Ankles full ROM no tenderness or swelling   Investigation: No additional findings.  Imaging: No results found.  Recent Labs: Lab Results  Component Value Date   WBC 14.9 (H) 05/05/2021   HGB 12.6 05/05/2021   PLT 378 05/05/2021   NA 135 05/05/2021   K 3.9 05/05/2021   CL 103 05/05/2021   CO2 25 05/05/2021   GLUCOSE 298 (H) 05/05/2021   BUN 12 05/05/2021   CREATININE 0.56 05/05/2021   BILITOT 0.3 06/15/2021   ALKPHOS 124 (H) 06/15/2021   AST 14 06/15/2021   ALT 19 06/15/2021   PROT 7.4 06/15/2021   ALBUMIN 4.2 06/15/2021   CALCIUM 9.7 05/05/2021   GFRAA >60 10/18/2017    Speciality Comments: No specialty comments available.  Procedures:  No procedures performed Allergies: Contrast media [iodinated diagnostic agents]   Assessment / Plan:     Visit Diagnoses: Rash and other nonspecific skin eruption - Plan: IgG, IgA, IgM, Chronic Urticaria index panel, C3 and C4  Chronic urticarial rashes for about 6 years without clear underlying diagnosis or known provocative agents.  Differential is broad can have joint arthralgias associated  with chronic idiopathic urticaria we will check index today.  Severe knee pains with chronic urticarial rashes and leukocytosis and high CRP could be seen with Schnitzler syndrome but steroid responsiveness is unusual and no history of fevers we will check immunoglobulins and complements.  Could consider updated knee x-ray or protein electrophoresis for rule out monoclonal IgM.  If nothing is suggested by this initial lab work skin biopsy or referral to allergist may be beneficial neck steps to rule out neutrophilic dermatoses or cutaneous vasculitis, or more extensive allergic urticaria work-up.  Chronic arthralgias of knees and hips  No focal changes or symptoms on exam today, x-ray from 2020 without very severe disease or sclerotic changes.  The described pattern of joint symptoms seems to be worst along with the skin disease activity could consider updated imaging or if swelling returns inspection for effusion versus edema.  Orders: Orders Placed This Encounter  Procedures   IgG, IgA, IgM   Chronic Urticaria index panel   C3 and C4    No orders of the defined types were placed in this encounter.    Follow-Up Instructions: No follow-ups on file.   Collier Salina, MD  Note - This record has been created using [employer].  Chart creation errors have been sought, but may not always  have been located. Such creation errors do not reflect on  the standard of medical care.

## 2021-06-28 NOTE — Progress Notes (Signed)
Cardiac Individual Treatment Plan  Patient Details  Name: Holly Garner MRN: [id number] Date of Birth: 1981-10-09 Referring Provider:   Flowsheet Row Cardiac Rehab from 06/19/2021 in [facility] Cardiac and Pulmonary Rehab  Referring Provider Kathlyn Sacramento MD  [facility] Cardiologist: Dr. Aaron Edelman Agbor-Etang]       Initial Encounter Date:  Flowsheet Row Cardiac Rehab from 06/19/2021 in [facility] Cardiac and Pulmonary Rehab  Date 06/19/21       Visit Diagnosis: NSTEMI (non-ST elevated myocardial infarction) [facility])  Status post coronary artery stent placement  Patient's Home Medications on Admission:  Current Outpatient Medications:    ACCU-CHEK GUIDE test strip, USE TO TEST BLOOD SUGARS UP TO FOUR TIMES DAILY, Disp: 100 each, Rfl: 1   Accu-Chek Softclix Lancets lancets, USE TO TEST BLOOD SUGARS UP TO FOUR TIMES DAILY, Disp: 100 each, Rfl: 1   aspirin 81 MG chewable tablet, Chew 1 tablet (81 mg total) by mouth daily., Disp: 30 tablet, Rfl: 0   atorvastatin (LIPITOR) 80 MG tablet, Take 1 tablet (80 mg total) by mouth daily., Disp: 90 tablet, Rfl: 0   blood glucose meter kit and supplies KIT, Dispense based on patient and insurance preference. Use up to four times daily as directed., Disp: 1 each, Rfl: 0   Continuous Blood Gluc Sensor (FREESTYLE LIBRE 2 SENSOR) MISC, 1 each by Does not apply route as directed., Disp: 2 each, Rfl: 2   dapagliflozin propanediol (FARXIGA) 5 MG TABS tablet, Take 1 tablet (5 mg total) by mouth daily before breakfast., Disp: 30 tablet, Rfl: 2   EPINEPHrine (EPIPEN 2-PAK) 0.3 mg/0.3 mL IJ SOAJ injection, Inject 0.3 mLs (0.3 mg total) into the muscle once., Disp: 2 Device, Rfl: 0   ezetimibe (ZETIA) 10 MG tablet, Take 1 tablet (10 mg total) by mouth daily., Disp: 90 tablet, Rfl: 3   insulin glargine (LANTUS SOLOSTAR) 100 UNIT/ML Solostar Pen, Inject 40 Units into the skin daily. STOP levemir, Disp: 15 mL, Rfl: 1   Insulin Pen Needle (PEN NEEDLES) 30G X 8 MM MISC, 1 each by  Does not apply route daily., Disp: 100 each, Rfl: 3   losartan (COZAAR) 25 MG tablet, Take 1 tablet (25 mg total) by mouth daily., Disp: 90 tablet, Rfl: 0   metFORMIN (GLUCOPHAGE) 1000 MG tablet, Take 1,000 mg by mouth 2 (two) times daily with a meal., Disp: , Rfl:    metoprolol succinate (TOPROL-XL) 25 MG 24 hr tablet, Take 1 tablet (25 mg total) by mouth daily., Disp: 90 tablet, Rfl: 0   misoprostol (CYTOTEC) 100 MCG tablet, Take 1 tablet (100 mcg total) by mouth once for 1 dose. 1 hour before appt, Disp: 1 tablet, Rfl: 0   predniSONE (DELTASONE) 10 MG tablet, Take 2 tablets (20 mg total) by mouth daily as needed., Disp: 4 tablet, Rfl: 0   PROAIR HFA 108 (90 Base) MCG/ACT inhaler, Inhale 1-2 puffs into the lungs every 6 (six) hours as needed for wheezing or shortness of breath., Disp: 8.5 g, Rfl: 0   ticagrelor (BRILINTA) 90 MG TABS tablet, Take 1 tablet (90 mg total) by mouth 2 (two) times daily., Disp: 180 tablet, Rfl: 0  Past Medical History: Past Medical History:  Diagnosis Date   BMI 50.0-59.9, adult ([place])    CAD (coronary artery disease)    a. 04/2021 NSTEMI/PCI: LM nl, LAD 37m 30d, D1 nl, LCX 660mOM1 min irregs, RCA 20p, 30p/m, 100d (2.5x26 Resolute Onyx DES).   Essential hypertension    History of echocardiogram  a. 04/2021 Echo: EF 60-65%. No rwma. Nl RV fxn.   History of Papanicolaou smear of cervix 04/2011   normal per pt.   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   hgsil   Tobacco abuse    Type II diabetes mellitus (HCC)     Tobacco Use: Social History   Tobacco Use  Smoking Status Former   Packs/day: 1.00   Years: 10.00   Pack years: 10.00   Types: Cigarettes   Quit date: 05/03/2021   Years since quitting: 0.1  Smokeless Tobacco Never    Labs: Recent Review Flowsheet Data     Labs for ITP Cardiac and Pulmonary Rehab Latest Ref Rng & Units 04/28/2019 07/23/2019 05/03/2021 05/04/2021 06/15/2021   Cholestrol 100 - 199 mg/dL 226(H) - - 193 163    LDLCALC 0 - 99 mg/dL 160(H) - - 130(H) 105(H)   HDL >39 mg/dL 29.40(L) - - 26(L) 36(L)   Trlycerides 0 - 149 mg/dL 185.0(H) - - 184(H) 123   Hemoglobin A1c 4.8 - 5.6 % - 8.5(A) 10.8(H) - -        Exercise Target Goals: Exercise Program Goal: Individual exercise prescription set using results from initial 6 min walk test and THRR while considering  patient's activity barriers and safety.   Exercise Prescription Goal: Initial exercise prescription builds to 30-45 minutes a day of aerobic activity, 2-3 days per week.  Home exercise guidelines will be given to patient during program as part of exercise prescription that the participant will acknowledge.   Education: Aerobic Exercise: - Group verbal and visual presentation on the components of exercise prescription. Introduces F.I.T.T principle from ACSM for exercise prescriptions.  Reviews F.I.T.T. principles of aerobic exercise including progression. Written material given at graduation. Flowsheet Row Cardiac Rehab from 06/19/2021 in [facility] Cardiac and Pulmonary Rehab  Education need identified 06/19/21       Education: Resistance Exercise: - Group verbal and visual presentation on the components of exercise prescription. Introduces F.I.T.T principle from ACSM for exercise prescriptions  Reviews F.I.T.T. principles of resistance exercise including progression. Written material given at graduation.    Education: Exercise & Equipment Safety: - Individual verbal instruction and demonstration of equipment use and safety with use of the equipment. Flowsheet Row Cardiac Rehab from 06/19/2021 in [facility] Cardiac and Pulmonary Rehab  Date 06/19/21  Educator [facility]  Instruction Review Code 1- Verbalizes Understanding       Education: Exercise Physiology & General Exercise Guidelines: - Group verbal and written instruction with models to review the exercise physiology of the cardiovascular system and associated critical values. Provides general  exercise guidelines with specific guidelines to those with heart or lung disease.    Education: Flexibility, Balance, Mind/Body Relaxation: - Group verbal and visual presentation with interactive activity on the components of exercise prescription. Introduces F.I.T.T principle from ACSM for exercise prescriptions. Reviews F.I.T.T. principles of flexibility and balance exercise training including progression. Also discusses the mind body connection.  Reviews various relaxation techniques to help reduce and manage stress (i.e. Deep breathing, progressive muscle relaxation, and visualization). Balance handout provided to take home. Written material given at graduation.   Activity Barriers & Risk Stratification:  Activity Barriers & Cardiac Risk Stratification - 06/19/21 0946       Activity Barriers & Cardiac Risk Stratification   Activity Barriers Deconditioning;Muscular Weakness    Cardiac Risk Stratification Moderate             6 Minute Walk:  6 Minute Walk  Row Name 06/19/21 0940         6 Minute Walk   Phase Initial     Distance 1130 feet     Walk Time 6 minutes     # of Rest Breaks 0     MPH 2.14     METS 3.78     RPE 7     VO2 Peak 13.21     Symptoms No     Resting HR 72 bpm     Resting BP 146/76     Resting Oxygen Saturation  98 %     Exercise Oxygen Saturation  during 6 min walk 96 %     Max Ex. HR 131 bpm     Max Ex. BP 156/76     2 Minute Post BP 126/64              Oxygen Initial Assessment:   Oxygen Re-Evaluation:   Oxygen Discharge (Final Oxygen Re-Evaluation):   Initial Exercise Prescription:  Initial Exercise Prescription - 06/19/21 1000       Date of Initial Exercise RX and Referring Provider   Date 06/19/21    Referring Provider Kathlyn Sacramento MD   Primary Cardiologist: Dr. Kate Sable     Treadmill   MPH 2.5    Grade 1    Minutes 15    METs 3.26      Recumbant Bike   Level 4    RPM 50    Watts 76    Minutes 15     METs 3.5      Elliptical   Level 1    Speed 3    Minutes 15    METs 3      T5 Nustep   Level 3    SPM 80    Minutes 15    METs 3.5      Prescription Details   Frequency (times per week) 3    Duration Progress to 30 minutes of continuous aerobic without signs/symptoms of physical distress      Intensity   THRR 40-80% of Max Heartrate 116-161    Ratings of Perceived Exertion 11-13    Perceived Dyspnea 0-4      Progression   Progression Continue to progress workloads to maintain intensity without signs/symptoms of physical distress.      Resistance Training   Training Prescription Yes    Weight 4 lb    Reps 10-15             Perform Capillary Blood Glucose checks as needed.  Exercise Prescription Changes:   Exercise Prescription Changes     Row Name 06/19/21 1000             Response to Exercise   Blood Pressure (Admit) 146/76       Blood Pressure (Exercise) 156/78       Blood Pressure (Exit) 126/64       Heart Rate (Admit) 72 bpm       Heart Rate (Exercise) 131 bpm       Heart Rate (Exit) 75 bpm       Oxygen Saturation (Admit) 98 %       Oxygen Saturation (Exercise) 96 %       Rating of Perceived Exertion (Exercise) 7       Symptoms none       Comments walk test results                Exercise Comments:  Exercise Goals and Review:   Exercise Goals     Row Name 06/19/21 1017             Exercise Goals   Increase Strength and Stamina Yes       Intervention Provide advice, education, support and counseling about physical activity/exercise needs.;Develop an individualized exercise prescription for aerobic and resistive training based on initial evaluation findings, risk stratification, comorbidities and participant's personal goals.       Expected Outcomes Short Term: Increase workloads from initial exercise prescription for resistance, speed, and METs.;Short Term: Perform resistance training exercises routinely during rehab and add in  resistance training at home;Long Term: Improve cardiorespiratory fitness, muscular endurance and strength as measured by increased METs and functional capacity (6MWT)       Able to understand and use rate of perceived exertion (RPE) scale Yes       Intervention Provide education and explanation on how to use RPE scale       Expected Outcomes Short Term: Able to use RPE daily in rehab to express subjective intensity level;Long Term:  Able to use RPE to guide intensity level when exercising independently       Able to understand and use Dyspnea scale Yes       Intervention Provide education and explanation on how to use Dyspnea scale       Expected Outcomes Long Term: Able to use Dyspnea scale to guide intensity level when exercising independently;Short Term: Able to use Dyspnea scale daily in rehab to express subjective sense of shortness of breath during exertion       Knowledge and understanding of Target Heart Rate Range (THRR) Yes       Intervention Provide education and explanation of THRR including how the numbers were predicted and where they are located for reference       Expected Outcomes Short Term: Able to state/look up THRR;Short Term: Able to use daily as guideline for intensity in rehab;Long Term: Able to use THRR to govern intensity when exercising independently       Able to check pulse independently Yes       Intervention Provide education and demonstration on how to check pulse in carotid and radial arteries.;Review the importance of being able to check your own pulse for safety during independent exercise       Expected Outcomes Short Term: Able to explain why pulse checking is important during independent exercise;Long Term: Able to check pulse independently and accurately       Understanding of Exercise Prescription Yes       Intervention Provide education, explanation, and written materials on patient's individual exercise prescription       Expected Outcomes Short Term: Able to  explain program exercise prescription;Long Term: Able to explain home exercise prescription to exercise independently                Exercise Goals Re-Evaluation :   Discharge Exercise Prescription (Final Exercise Prescription Changes):  Exercise Prescription Changes - 06/19/21 1000       Response to Exercise   Blood Pressure (Admit) 146/76    Blood Pressure (Exercise) 156/78    Blood Pressure (Exit) 126/64    Heart Rate (Admit) 72 bpm    Heart Rate (Exercise) 131 bpm    Heart Rate (Exit) 75 bpm    Oxygen Saturation (Admit) 98 %    Oxygen Saturation (Exercise) 96 %    Rating of Perceived Exertion (Exercise) 7    Symptoms  none    Comments walk test results             Nutrition:  Target Goals: Understanding of nutrition guidelines, daily intake of sodium <1573m, cholesterol <2051m calories 30% from fat and 7% or less from saturated fats, daily to have 5 or more servings of fruits and vegetables.  Education: All About Nutrition: -Group instruction provided by verbal, written material, interactive activities, discussions, models, and posters to present general guidelines for heart healthy nutrition including fat, fiber, MyPlate, the role of sodium in heart healthy nutrition, utilization of the nutrition label, and utilization of this knowledge for meal planning. Follow up email sent as well. Written material given at graduation. [facility]  Education need identified 06/19/21       Biometrics:  Pre Biometrics - 06/19/21 1017       Pre Biometrics   Height 5' 6"  (1.676 m)    Weight 297 lb 6.4 oz (134.9 kg)    BMI (Calculated) 48.02    Single Leg Stand 30 seconds              Nutrition Therapy Plan and Nutrition Goals:  Nutrition Therapy & Goals - 06/19/21 1019       Intervention Plan   Intervention Prescribe, educate and counsel regarding individualized specific dietary modifications  aiming towards targeted core components such as weight, hypertension, lipid management, diabetes, heart failure and other comorbidities.    Expected Outcomes Short Term Goal: Understand basic principles of dietary content, such as calories, fat, sodium, cholesterol and nutrients.;Long Term Goal: Adherence to prescribed nutrition plan.;Short Term Goal: A plan has been developed with personal nutrition goals set during dietitian appointment.             Nutrition Assessments:  MEDIFICTS Score Key: ?70 Need to make dietary changes  40-70 Heart Healthy Diet ? 40 Therapeutic Level Cholesterol Diet  [facility]  Picture Your Plate Total Score on Admission 75      Picture Your Plate Scores: <4<16nhealthy dietary pattern with much room for improvement. 41-50 Dietary pattern unlikely to meet recommendations for good health and room for improvement. 51-60 More healthful dietary pattern, with some room for improvement.  >60 Healthy dietary pattern, although there may be some specific behaviors that could be improved.    Nutrition Goals Re-Evaluation:   Nutrition Goals Discharge (Final Nutrition Goals Re-Evaluation):   Psychosocial: Target Goals: Acknowledge presence or absence of significant depression and/or stress, maximize coping skills, provide positive support system. Participant is able to verbalize types and ability to use techniques and skills needed for reducing stress and depression.   Education: Stress, Anxiety, and Depression - Group verbal and visual presentation to define topics covered.  Reviews how body is impacted by stress, anxiety, and depression.  Also discusses healthy ways to reduce stress and to treat/manage anxiety and depression.  Written material given at graduation.   Education: Sleep Hygiene -Provides group verbal and written instruction about how sleep can affect your health.  Define sleep  hygiene, discuss sleep cycles and impact of sleep habits. Review good sleep hygiene tips.    Initial Review & Psychosocial Screening:  Initial Psych Review & Screening - 05/26/21 1410       Initial Review   Current issues with Current Sleep Concerns;Current Stress Concerns    Source of Stress Concerns Chronic Illness  Family Dynamics   Good Support System? Yes   husband, kids, parents     Barriers   Psychosocial barriers to participate in program There are no identifiable barriers or psychosocial needs.;The patient should benefit from training in stress management and relaxation.      Screening Interventions   Interventions Encouraged to exercise;Provide feedback about the scores to participant;To provide support and resources with identified psychosocial needs    Expected Outcomes Short Term goal: Utilizing psychosocial counselor, staff and physician to assist with identification of specific Stressors or current issues interfering with healing process. Setting desired goal for each stressor or current issue identified.;Long Term Goal: Stressors or current issues are controlled or eliminated.;Short Term goal: Identification and review with participant of any Quality of Life or Depression concerns found by scoring the questionnaire.;Long Term goal: The participant improves quality of Life and PHQ9 Scores as seen by post scores and/or verbalization of changes             Quality of Life Scores:   Quality of Life - 06/19/21 1018       Quality of Life   Select Quality of Life      Quality of Life Scores   Health/Function Pre 29.6 %    Socioeconomic Pre 30 %    Psych/Spiritual Pre 30 %    Family Pre 30 %    GLOBAL Pre 29.82 %            Scores of 19 and below usually indicate a poorer quality of life in these areas.  A difference of  2-3 points is a clinically meaningful difference.  A difference of 2-3 points in the total score of the Quality of Life Index has been  associated with significant improvement in overall quality of life, self-image, physical symptoms, and general health in studies assessing change in quality of life.  PHQ-9: Recent Review Flowsheet Data     Depression screen [facility] 2/9 06/19/2021 05/24/2021 03/24/2019   Decreased Interest 0 0 0   Down, Depressed, Hopeless 0 0 0   PHQ - 2 Score 0 0 0   Altered sleeping 2 - -   Tired, decreased energy 1 - -   Change in appetite 0 - -   Feeling bad or failure about yourself  0 - -   Trouble concentrating 0 - -   Moving slowly or fidgety/restless 0 - -   Suicidal thoughts 0 - -   PHQ-9 Score 3 - -   Difficult doing work/chores Not difficult at all - -      Interpretation of Total Score  Total Score Depression Severity:  1-4 = Minimal depression, 5-9 = Mild depression, 10-14 = Moderate depression, 15-19 = Moderately severe depression, 20-27 = Severe depression   Psychosocial Evaluation and Intervention:  Psychosocial Evaluation - 05/26/21 1423       Psychosocial Evaluation & Interventions   Interventions Stress management education;Encouraged to exercise with the program and follow exercise prescription    Comments Holly Garner is coming to cardiac rehab bost MI and stents. While she was in the hospital, she was diagnosed with diabetes. She is seeing a diabetes educator to learn more about living with diabetes. She also quit smoking when she was admitted and so did her husband. Together, they both feel encouraged to keep each other accountable. She thinks her MI was exacerbated by her son's friend's car accident that killed 3 people. She found out later that her son was supposed to be in  the car, so although she is very thankful he was not it is stressful to think about and going through the grieving process for those teens. She states her husband, parents, and children are her main support system and they have been very present during her recovery. 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 She is excited about starting cardiac rehab to learn more about heart healthy living, including managing her diabetes and risk factors.    Expected Outcomes Short: attend cardiac rehab for education and exercise. Long: develop and maintain positive self care habits.    Continue Psychosocial Services  Follow up required by staff             Psychosocial Re-Evaluation:   Psychosocial Discharge (Final Psychosocial Re-Evaluation):   Vocational Rehabilitation: Provide vocational rehab assistance to qualifying candidates.   Vocational Rehab Evaluation & Intervention:  Vocational Rehab - 05/26/21 1413       Initial Vocational Rehab Evaluation & Intervention   Assessment shows need for Vocational Rehabilitation No             Education: Education Goals: Education classes will be provided on a variety of topics geared toward better understanding of heart health and risk factor modification. Participant will state understanding/return demonstration of topics presented as noted by education test scores.  Learning Barriers/Preferences:  Learning Barriers/Preferences - 05/26/21 1413       Learning Barriers/Preferences   Learning Barriers None    Learning Preferences None             General Cardiac Education Topics:  AED/CPR: - Group verbal and written instruction with the use of models to demonstrate the basic use of the AED with the basic ABC's of resuscitation.   Anatomy and Cardiac Procedures: - Group verbal and visual presentation and models provide information about basic cardiac anatomy and function. Reviews the testing methods done to diagnose heart disease and the outcomes of the test results. Describes the treatment choices: Medical Management, Angioplasty, or Coronary Bypass Surgery for treating various heart conditions including Myocardial Infarction, Angina, Valve Disease, and Cardiac Arrhythmias.   Written material given at graduation.   Medication Safety: - Group verbal and visual instruction to review commonly prescribed medications for heart and lung disease. Reviews the medication, class of the drug, and side effects. Includes the steps to properly store meds and maintain the prescription regimen.  Written material given at graduation.   Intimacy: - Group verbal instruction through game format to discuss how heart and lung disease can affect sexual intimacy. Written material given at graduation..   Know Your Numbers and Heart Failure: - Group verbal and visual instruction to discuss disease risk factors for cardiac and pulmonary disease and treatment options.  Reviews associated critical values for Overweight/Obesity, Hypertension, Cholesterol, and Diabetes.  Discusses basics of heart failure: signs/symptoms and treatments.  Introduces Heart Failure Zone chart for action plan for heart failure.  Written material given at graduation. Flowsheet Row Cardiac Rehab from 06/19/2021 in [facility] Cardiac and Pulmonary Rehab  Education need identified 06/19/21       Infection Prevention: - Provides verbal and written material to individual with discussion of infection control including proper hand washing and proper equipment cleaning during exercise session. Flowsheet Row Cardiac Rehab from 06/19/2021 in [facility] Cardiac and Pulmonary Rehab  Date 06/19/21  Educator [facility]  Instruction Review Code 1- Verbalizes Understanding  Falls Prevention: - Provides verbal and written material to individual with discussion of falls prevention and safety. Flowsheet Row Cardiac Rehab from 06/19/2021 in [facility] Cardiac and Pulmonary Rehab  Date 06/19/21  Educator [facility]  Instruction Review Code 1- Verbalizes Understanding       Other: -Provides group and verbal instruction on various topics (see comments)   Knowledge Questionnaire Score:  Knowledge Questionnaire Score - 06/19/21 1019       Knowledge  Questionnaire Score   Pre Score 22/26 Education: Angina, exericse, nutrition, PAD             Core Components/Risk Factors/Patient Goals at Admission:  Personal Goals and Risk Factors at Admission - 06/19/21 1022       Core Components/Risk Factors/Patient Goals on Admission    Weight Management Yes;Weight Loss;Obesity    Intervention Weight Management: Develop a combined nutrition and exercise program designed to reach desired caloric intake, while maintaining appropriate intake of nutrient and fiber, sodium and fats, and appropriate energy expenditure required for the weight goal.;Weight Management: Provide education and appropriate resources to help participant work on and attain dietary goals.;Weight Management/Obesity: Establish reasonable short term and long term weight goals.;Obesity: Provide education and appropriate resources to help participant work on and attain dietary goals.    Admit Weight 297 lb 6.4 oz (134.9 kg)    Goal Weight: Short Term 290 lb (131.5 kg)    Goal Weight: Long Term 280 lb (127 kg)    Expected Outcomes Short Term: Continue to assess and modify interventions until short term weight is achieved;Long Term: Adherence to nutrition and physical activity/exercise program aimed toward attainment of established weight goal;Weight Loss: Understanding of general recommendations for a balanced deficit meal plan, which promotes 1-2 lb weight loss per week and includes a negative energy balance of [phone] kcal/d;Understanding recommendations for meals to include 15-35% energy as protein, 25-35% energy from fat, 35-60% energy from carbohydrates, less than 227m of dietary cholesterol, 20-35 gm of total fiber daily;Understanding of distribution of calorie intake throughout the day with the consumption of 4-5 meals/snacks    Tobacco Cessation Yes   quit 05/03/21   Expected Outcomes Short Term: Will quit all tobacco product use, adhering to prevention of relapse plan.;Long Term:  Complete abstinence from all tobacco products for at least 12 months from quit date.    Diabetes Yes    Intervention Provide education about signs/symptoms and action to take for hypo/hyperglycemia.;Provide education about proper nutrition, including hydration, and aerobic/resistive exercise prescription along with prescribed medications to achieve blood glucose in normal ranges: Fasting glucose 65-99 mg/dL    Expected Outcomes Short Term: Participant verbalizes understanding of the signs/symptoms and immediate care of hyper/hypoglycemia, proper foot care and importance of medication, aerobic/resistive exercise and nutrition plan for blood glucose control.;Long Term: Attainment of HbA1C < 7%.    Hypertension Yes    Intervention Provide education on lifestyle modifcations including regular physical activity/exercise, weight management, moderate sodium restriction and increased consumption of fresh fruit, vegetables, and low fat dairy, alcohol moderation, and smoking cessation.;Monitor prescription use compliance.    Expected Outcomes Short Term: Continued assessment and intervention until BP is < 140/926mHG in hypertensive participants. < 130/8037mG in hypertensive participants with diabetes, heart failure or chronic kidney disease.;Long Term: Maintenance of blood pressure at goal levels.    Lipids Yes    Intervention Provide education and support for participant on nutrition & aerobic/resistive exercise along with prescribed medications to achieve LDL <19m12mDL >40mg56m  Expected Outcomes Short Term: Participant states understanding of desired cholesterol values and is compliant with medications prescribed. Participant is following exercise prescription and nutrition guidelines.;Long Term: Cholesterol controlled with medications as prescribed, with individualized exercise RX and with personalized nutrition plan. Value goals: LDL < 57m, HDL > 40 mg.             Education:Diabetes - Individual  verbal and written instruction to review signs/symptoms of diabetes, desired ranges of glucose level fasting, after meals and with exercise. Acknowledge that pre and post exercise glucose checks will be done for 3 sessions at entry of program. [place] [facility] [facility]  Date 05/26/21  [facility] Instruction Review Code 1- Verbalizes Understanding       Core Components/Risk Factors/Patient Goals Review:   Goals and Risk Factor Review     Row Name 06/19/21 1037             Core Components/Risk Factors/Patient Goals Review   Personal Goals Review Tobacco Cessation       Review She has recently quit tobacco use within the last 6 months. Intervention for relapse prevention was provided at the initial medical review. Patient was encouraged to continue to with tobacco cessation and was provided information on relapse prevention. Patient received information about combination therapy, tobacco cessation classes, quit line, and quit smoking apps in case of a relapse. Patient demonstrated understanding of this material.Staff will continue to provide encouragement and follow up with the patient throughout the program.       Expected Outcomes Continued cessation                Core Components/Risk Factors/Patient Goals at Discharge (Final Review):   Goals and Risk Factor Review - 06/19/21 1037       Core Components/Risk Factors/Patient Goals Review   Personal Goals Review Tobacco Cessation    Review She has recently quit tobacco use within the last 6 months. Intervention for relapse prevention was provided at the initial medical review. Patient was encouraged to continue to with tobacco cessation and was provided information on relapse prevention. Patient received information about combination therapy, tobacco cessation classes, quit line, and quit smoking apps in case of a relapse. Patient demonstrated understanding of this material.Staff  will continue to provide encouragement and follow up with the patient throughout the program.    Expected Outcomes Continued cessation             ITP Comments:  ITP Comments     Row Name 05/26/21 1429 06/19/21 0939 06/28/21 0730       ITP Comments Initial telephone orientation completed. [facility] EP orientation scheduled for Thursday 8/11 at 8am. Completed 6MWT and gym orientation. Initial ITP created and sent for review to Dr. MEmily Filbert Medical Director. 30 Day review completed. Medical Director ITP review done, changes made as directed, and signed approval by Medical Director.              Comments: 1

## 2021-06-29 ENCOUNTER — Other Ambulatory Visit: Payer: Self-pay | Admitting: Nurse Practitioner

## 2021-06-29 ENCOUNTER — Other Ambulatory Visit: Payer: Self-pay

## 2021-06-29 ENCOUNTER — Encounter: Payer: Self-pay | Admitting: Internal Medicine

## 2021-06-29 ENCOUNTER — Ambulatory Visit: Payer: Medicaid Other | Admitting: Internal Medicine

## 2021-06-29 VITALS — BP 115/78 | HR 71 | Resp 16 | Ht 65.0 in | Wt 299.0 lb

## 2021-06-29 DIAGNOSIS — E1169 Type 2 diabetes mellitus with other specified complication: Secondary | ICD-10-CM

## 2021-06-29 DIAGNOSIS — M25552 Pain in left hip: Secondary | ICD-10-CM

## 2021-06-29 DIAGNOSIS — M25562 Pain in left knee: Secondary | ICD-10-CM

## 2021-06-29 DIAGNOSIS — R21 Rash and other nonspecific skin eruption: Secondary | ICD-10-CM | POA: Insufficient documentation

## 2021-06-29 DIAGNOSIS — M25561 Pain in right knee: Secondary | ICD-10-CM | POA: Diagnosis not present

## 2021-06-29 DIAGNOSIS — M25551 Pain in right hip: Secondary | ICD-10-CM

## 2021-06-29 DIAGNOSIS — G8929 Other chronic pain: Secondary | ICD-10-CM | POA: Diagnosis not present

## 2021-06-29 MED ORDER — METFORMIN HCL 1000 MG PO TABS: 1000.0000 mg | ORAL_TABLET | Freq: Two times a day (BID) | ORAL | 3 refills | Status: DC

## 2021-07-03 ENCOUNTER — Encounter: Payer: Medicaid Other | Attending: Cardiovascular Disease

## 2021-07-03 DIAGNOSIS — I252 Old myocardial infarction: Secondary | ICD-10-CM | POA: Insufficient documentation

## 2021-07-03 DIAGNOSIS — Z955 Presence of coronary angioplasty implant and graft: Secondary | ICD-10-CM | POA: Insufficient documentation

## 2021-07-04 ENCOUNTER — Other Ambulatory Visit: Payer: Self-pay

## 2021-07-04 ENCOUNTER — Encounter: Payer: Self-pay | Admitting: *Deleted

## 2021-07-04 ENCOUNTER — Emergency Department: Payer: Medicaid Other

## 2021-07-04 DIAGNOSIS — R0789 Other chest pain: Secondary | ICD-10-CM | POA: Diagnosis not present

## 2021-07-04 DIAGNOSIS — J9801 Acute bronchospasm: Secondary | ICD-10-CM | POA: Insufficient documentation

## 2021-07-04 DIAGNOSIS — Z87891 Personal history of nicotine dependence: Secondary | ICD-10-CM | POA: Insufficient documentation

## 2021-07-04 DIAGNOSIS — Z7982 Long term (current) use of aspirin: Secondary | ICD-10-CM | POA: Diagnosis not present

## 2021-07-04 DIAGNOSIS — Z79899 Other long term (current) drug therapy: Secondary | ICD-10-CM | POA: Diagnosis not present

## 2021-07-04 DIAGNOSIS — I10 Essential (primary) hypertension: Secondary | ICD-10-CM | POA: Diagnosis not present

## 2021-07-04 DIAGNOSIS — Z7984 Long term (current) use of oral hypoglycemic drugs: Secondary | ICD-10-CM | POA: Diagnosis not present

## 2021-07-04 DIAGNOSIS — E119 Type 2 diabetes mellitus without complications: Secondary | ICD-10-CM | POA: Insufficient documentation

## 2021-07-04 DIAGNOSIS — I2510 Atherosclerotic heart disease of native coronary artery without angina pectoris: Secondary | ICD-10-CM | POA: Insufficient documentation

## 2021-07-04 DIAGNOSIS — R079 Chest pain, unspecified: Secondary | ICD-10-CM | POA: Diagnosis not present

## 2021-07-04 LAB — BASIC METABOLIC PANEL
Anion gap: 11 (ref 5–15)
BUN: 19 mg/dL (ref 6–20)
CO2: 25 mmol/L (ref 22–32)
Calcium: 9.3 mg/dL (ref 8.9–10.3)
Chloride: 103 mmol/L (ref 98–111)
Creatinine, Ser: 0.74 mg/dL (ref 0.44–1.00)
GFR, Estimated: >60 mL/min (ref >60)
Glucose, Bld: 175 mg/dL — ABNORMAL HIGH (ref 70–99)
Potassium: 3.5 mmol/L (ref 3.5–5.1)
Sodium: 139 mmol/L (ref 135–145)

## 2021-07-04 LAB — CBC
HCT: 40.1 % (ref 36.0–46.0)
Hemoglobin: 12.5 g/dL (ref 12.0–15.0)
MCH: 25.5 pg — ABNORMAL LOW (ref 26.0–34.0)
MCHC: 31.2 g/dL (ref 30.0–36.0)
MCV: 81.8 fL (ref 80.0–100.0)
Platelets: 424 10*3/uL — ABNORMAL HIGH (ref 150–400)
RBC: 4.9 MIL/uL (ref 3.87–5.11)
RDW: 15.5 % (ref 11.5–15.5)
WBC: 13 10*3/uL — ABNORMAL HIGH (ref 4.0–10.5)
nRBC: 0 % (ref 0.0–0.2)

## 2021-07-04 LAB — TROPONIN I (HIGH SENSITIVITY): Troponin I (High Sensitivity): 8 ng/L (ref <18)

## 2021-07-04 NOTE — ED Triage Notes (Signed)
Pt reports she had some central chest pain around noon today that subsided. Tonight having discomfort in the center of her chest and just to the left side. She says it "feels more hard to breathe". Hx of MI/diabetes.

## 2021-07-05 ENCOUNTER — Telehealth: Payer: Self-pay | Admitting: Cardiology

## 2021-07-05 ENCOUNTER — Emergency Department
Admission: EM | Admit: 2021-07-05 | Discharge: 2021-07-05 | Disposition: A | Discharge status: Home / Self Care | Payer: Medicaid Other | Attending: Emergency Medicine | Admitting: Emergency Medicine

## 2021-07-05 DIAGNOSIS — J9801 Acute bronchospasm: Secondary | ICD-10-CM

## 2021-07-05 DIAGNOSIS — R079 Chest pain, unspecified: Secondary | ICD-10-CM

## 2021-07-05 LAB — C3 AND C4
C3 Complement: 192 mg/dL (ref 83–193)
C4 Complement: 35 mg/dL (ref 15–57)

## 2021-07-05 LAB — IGG, IGA, IGM
IgG (Immunoglobin G), Serum: 915 mg/dL (ref 600–1640)
IgM, Serum: 59 mg/dL (ref 50–300)
Immunoglobulin A: 271 mg/dL (ref 47–310)

## 2021-07-05 LAB — TROPONIN I (HIGH SENSITIVITY): Troponin I (High Sensitivity): 8 ng/L (ref <18)

## 2021-07-05 LAB — CP CHRONIC URTICARIA INDEX PANEL
Histamine Release: <16 % (ref <16)
TSH: 1.5 mIU/L (ref 0.40–4.50)
Thyroglobulin Ab: 2 IU/mL — ABNORMAL HIGH (ref <=1)
Thyroperoxidase Ab SerPl-aCnc: <1 IU/mL (ref <9)

## 2021-07-05 LAB — HEPATIC FUNCTION PANEL
ALT: 18 U/L (ref 0–44)
AST: 15 U/L (ref 15–41)
Albumin: 3.7 g/dL (ref 3.5–5.0)
Alkaline Phosphatase: 91 U/L (ref 38–126)
Bilirubin, Direct: <0.1 mg/dL (ref 0.0–0.2)
Total Bilirubin: 0.5 mg/dL (ref 0.3–1.2)
Total Protein: 6.9 g/dL (ref 6.5–8.1)

## 2021-07-05 LAB — LIPASE, BLOOD: Lipase: 32 U/L (ref 11–51)

## 2021-07-05 MED ORDER — ALBUTEROL SULFATE (2.5 MG/3ML) 0.083% IN NEBU
5.0000 mg | INHALATION_SOLUTION | Freq: Once | RESPIRATORY_TRACT | Status: AC
  Administered 2021-07-05: 5 mg via RESPIRATORY_TRACT
  Filled 2021-07-05: qty 6

## 2021-07-05 MED ORDER — ASPIRIN 81 MG PO CHEW
324.0000 mg | CHEWABLE_TABLET | Freq: Once | ORAL | Status: AC
  Administered 2021-07-05: 324 mg via ORAL
  Filled 2021-07-05: qty 4

## 2021-07-05 MED ORDER — ALBUTEROL SULFATE (2.5 MG/3ML) 0.083% IN NEBU
INHALATION_SOLUTION | RESPIRATORY_TRACT | Status: AC
  Filled 2021-07-05: qty 6

## 2021-07-05 NOTE — ED Provider Notes (Signed)
[facility] Emergency Department Provider Note  ____________________________________________  Time seen: Approximately 12:50 AM  I have reviewed the triage vital signs and the nursing notes.   HISTORY  Chief Complaint Chest Pain   HPI Holly Garner is a 40 y.o. female with a history of obesity, CAD status post NSTEMI and stenting, hypertension, hyperlipidemia, diabetes, former smoker who presents for evaluation of chest tightness.  Patient had a heart attack 2 months ago.  Since having his stent placed denies ever having any chest pain until today.  Around lunchtime she had a mild episode of chest tightness which lasted about 20 minutes and resolved without intervention.  This evening she was sitting watching TV when he started having symptoms again.  The symptoms now have been constant for several hours although significantly improved.  She is complaining of mild tightness in the center of her chest.  She reports the symptoms to different than the previous heart attack.  She denies SOB, cough or wheezing, congestion, fever, chills.  No personal family history.  DVT, no recent travel immobilization, no leg pain or swelling, no hemoptysis or exogenous hormones.  Patient denies any known diagnosis of asthma or COPD.  Patient smoked for a long time however quit 2 months ago after having her heart attack.   Past Medical History:  Diagnosis Date   BMI 50.0-59.9, adult ([place])    CAD (coronary artery disease)    a. 04/2021 NSTEMI/PCI: LM nl, LAD 45m 30d, D1 nl, LCX 644mOM1 min irregs, RCA 20p, 30p/m, 100d (2.5x26 Resolute Onyx DES).   Essential hypertension    High cholesterol    History of echocardiogram    a. 04/2021 Echo: EF 60-65%. No rwma. Nl RV fxn.   History of Papanicolaou smear of cervix 04/2011   normal per pt.   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   hgsil   Tobacco abuse    [facility] mellitus ([facility]    Patient Active  Problem List   Diagnosis Date Noted   Rash and other nonspecific skin eruption 06/29/2021   Chronic arthralgias of knees and hips 06/29/2021   Uncontrolled type 2 diabetes mellitus with hyperglycemia (HC[facility]um[facility]07/14/2022   Chest pain 05/03/2021   Cellulitis of chest wall 05/03/2021   Hx of non-ST elevation myocardial infarction (NSTEMI) 05/03/2021   Sepsis ([facility]07/13/2022   Tobacco abuse 05/03/2021   Pure hypercholesterolemia    Type 2 diabetes mellitus with other specified complication (HCOakman0627/74/1287 HTN (hypertension) 04/07/2019   Swelling 03/24/2019   Morbid obesity (HCMelrose06/11/2018   Seasonal allergies 03/24/2019    Past Surgical History:  Procedure Laterality Date   COLPOSCOPY  09/28/2009   CORONARY STENT INTERVENTION N/A 05/04/2021   Procedure: CORONARY STENT INTERVENTION;  Surgeon: ArWellington HampshireMD;  Location: [facility];  Service: Cardiovascular;  Laterality: N/A;   LEFT HEART CATH AND CORONARY ANGIOGRAPHY N/A 05/04/2021   Procedure: LEFT HEART CATH AND CORONARY ANGIOGRAPHY;  Surgeon: ArWellington HampshireMD;  Location: [facility];  Service: Cardiovascular;  Laterality: N/A;   TONSILLECTOMY AND ADENOIDECTOMY     TYMPANOSTOMY TUBE PLACEMENT Bilateral     Prior to Admission medications   Medication Sig Start Date End Date Taking? Authorizing Provider  ACCU-CHEK GUIDE test strip USE TO TEST BLOOD SUGARS UP TO FOUR TIMES DAILY 05/31/21   CaMichela PitcherNP  Accu-Chek Softclix Lancets lancets USE TO TEST BLOOD SUGARS UP TO FOUR  TIMES DAILY 05/31/21   Michela Pitcher, NP  aspirin 81 MG chewable tablet Chew 1 tablet (81 mg total) by mouth daily. 05/06/21   Sharen Hones, MD  atorvastatin (LIPITOR) 80 MG tablet Take 1 tablet (80 mg total) by mouth daily. 05/11/21   Lesleigh Noe, MD  blood glucose meter kit and supplies KIT Dispense based on patient and insurance preference. Use up to four times daily as directed. 05/05/21   Sharen Hones, MD  Continuous Blood Gluc  Sensor (FREESTYLE LIBRE 2 SENSOR) MISC 1 each by Does not apply route as directed. 05/30/21   Michela Pitcher, NP  dapagliflozin propanediol (FARXIGA) 5 MG TABS tablet Take 1 tablet (5 mg total) by mouth daily before breakfast. 05/30/21   Michela Pitcher, NP  EPINEPHrine (EPIPEN 2-PAK) 0.3 mg/0.3 mL IJ SOAJ injection Inject 0.3 mLs (0.3 mg total) into the muscle once. 01/07/16   Nicholes Mango, MD  ezetimibe (ZETIA) 10 MG tablet Take 1 tablet (10 mg total) by mouth daily. 06/22/21 09/20/21  Theora Gianotti, NP  insulin glargine (LANTUS SOLOSTAR) 100 UNIT/ML Solostar Pen Inject 40 Units into the skin daily. STOP levemir 05/30/21   Michela Pitcher, NP  Insulin Pen Needle (PEN NEEDLES) 30G X 8 MM MISC 1 each by Does not apply route daily. 05/31/21   Michela Pitcher, NP  losartan (COZAAR) 25 MG tablet Take 1 tablet (25 mg total) by mouth daily. 05/11/21   Lesleigh Noe, MD  metFORMIN (GLUCOPHAGE) 1000 MG tablet Take 1 tablet (1,000 mg total) by mouth 2 (two) times daily with a meal. 06/29/21   Michela Pitcher, NP  metoprolol succinate (TOPROL-XL) 25 MG 24 hr tablet Take 1 tablet (25 mg total) by mouth daily. 05/11/21   Lesleigh Noe, MD  misoprostol (CYTOTEC) 100 MCG tablet Take 1 tablet (100 mcg total) by mouth once for 1 dose. 1 hour before appt 06/19/21 4/66/59  Copland, Deirdre Evener, PA-C  predniSONE (DELTASONE) 10 MG tablet Take 2 tablets (20 mg total) by mouth daily as needed. 06/05/21   Michela Pitcher, NP  PROAIR HFA [phone] Base) MCG/ACT inhaler Inhale 1-2 puffs into the lungs every 6 (six) hours as needed for wheezing or shortness of breath. 12/01/20   Jearld Fenton, NP  ticagrelor (BRILINTA) 90 MG TABS tablet Take 1 tablet (90 mg total) by mouth 2 (two) times daily. 05/11/21   Lesleigh Noe, MD    Allergies Contrast media [iodinated diagnostic agents]  Family History  Problem Relation Age of Onset   Diabetes Father    Breast cancer Maternal Grandmother        late years   Diabetes Paternal  Grandfather     Social History Social History   Tobacco Use   Smoking status: Former    Packs/day: 1.00    Years: 10.00    Pack years: 10.00    Types: Cigarettes    Quit date: 05/03/2021    Years since quitting: 0.1   Smokeless tobacco: Never  Vaping Use   Vaping Use: Never used  Substance Use Topics   Alcohol use: No   Drug use: No    Review of Systems  Constitutional: Negative for fever. Eyes: Negative for visual changes. ENT: Negative for sore throat. Neck: No neck pain  Cardiovascular: Negative for chest pain. + chest tightness Respiratory: Negative for shortness of breath. Gastrointestinal: Negative for abdominal pain, vomiting or diarrhea. Genitourinary: Negative for dysuria. Musculoskeletal: Negative for back pain.  Skin: Negative for rash. Neurological: Negative for headaches, weakness or numbness. Psych: No SI or HI  ____________________________________________   PHYSICAL EXAM:  VITAL SIGNS: Vitals:   07/05/21 0037 07/05/21 0100  BP: 120/61 110/63  Pulse: 80 87  Resp: 20 20  Temp:    SpO2: 98% 96%     Constitutional: Alert and oriented. Well appearing and in no apparent distress. HEENT:      Head: Normocephalic and atraumatic.         Eyes: Conjunctivae are normal. Sclera is non-icteric.       Mouth/Throat: Mucous membranes are moist.       Neck: Supple with no signs of meningismus. Cardiovascular: Regular rate and rhythm. No murmurs, gallops, or rubs. 2+ symmetrical distal pulses are present in all extremities. No JVD. Respiratory: Normal respiratory effort.  Decreased air movement bilaterally with no wheezing or crackle gastrointestinal: Soft, non tender, and non distended with positive bowel sounds. No rebound or guarding. Genitourinary: No CVA tenderness. Musculoskeletal:  No edema, cyanosis, or erythema of extremities. Neurologic: Normal speech and language. Face is symmetric. Moving all extremities. No gross focal neurologic deficits are  appreciated. Skin: Skin is warm, dry and intact. No rash noted. Psychiatric: Mood and affect are normal. Speech and behavior are normal.  ____________________________________________   LABS (all labs ordered are listed, but only abnormal results are displayed)  Labs Reviewed  BASIC METABOLIC PANEL - Abnormal; Notable for the following components:      Result Value   Glucose, Bld 175 (*)    All other components within normal limits  CBC - Abnormal; Notable for the following components:   WBC 13.0 (*)    MCH 25.5 (*)    Platelets 424 (*)    All other components within normal limits  HEPATIC FUNCTION PANEL  LIPASE, BLOOD  POC URINE PREG, ED  TROPONIN I (HIGH SENSITIVITY)  TROPONIN I (HIGH SENSITIVITY)   ____________________________________________  EKG  ED ECG REPORT I, Rudene Re, the attending physician, personally viewed and interpreted this ECG.  Sinus rhythm with rate of 88, normal intervals, normal axis, T wave inversions in inferior leads with no ST elevations.  Unchanged from prior. ____________________________________________  RADIOLOGY  I have personally reviewed the images performed during this visit and I agree with the Radiologist's read.   Interpretation by Radiologist:  DG Chest 2 View  Result Date: 07/04/2021 CLINICAL DATA:  Chest pain EXAM: CHEST - 2 VIEW COMPARISON:  05/04/2021 FINDINGS: Lungs are clear.  No pleural effusion or pneumothorax. The heart is normal in size. Visualized osseous structures are within normal limits. IMPRESSION: Normal chest radiographs. Electronically Signed   By: Julian Hy M.D.   On: 07/04/2021 21:37     ____________________________________________   PROCEDURES  Procedure(s) performed:yes .1-3 Lead EKG Interpretation Performed by: Rudene Re, MD Authorized by: Rudene Re, MD     Interpretation: non-specific     ECG rate assessment: normal     Rhythm: sinus rhythm     Ectopy: none      Conduction: normal     Critical Care performed:  None ____________________________________________   INITIAL IMPRESSION / ASSESSMENT AND PLAN / ED COURSE  40 y.o. female with a history of obesity, CAD status post NSTEMI and stenting, hypertension, hyperlipidemia, diabetes, former smoker who presents for evaluation of chest tightness.  Patient reports several hours of improving centralized chest tightness with no other associating symptoms.  On exam she is well-appearing in no distress with normal vital signs, normal work  of breathing and sats, she does have decreased air movement bilaterally with no wheezing or crackles.  Patient looks euvolemic with no asymmetric swelling of her lower extremities.  EKG showing no signs of acute ischemic changes and unchanged when compared to prior.  Patient has 2 high-sensitivity troponins here that are negative.  With an unchanged EKG and 2 negative troponins less likely ACS but since patient has constant symptoms and has decreased air movement bilaterally with a history of smoking for several years will give albuterol nebulizer treatment for possible bronchospasm/COPD exacerbation.  Patient denies pleuritic chest pain, she is not hypoxic, tachycardic, or tachypneic therefore low suspicion for PE.  Also possibly GERD/indigestion.  No signs of edema or pneumonia on chest x-ray.  History gathered from patient and her husband who was at bedside.  Plan discussed with both of them.  Old medical records review including reports from patient's echo and cardiac cath from July 2022.  Patient placed on telemetry for close monitoring of cardiorespiratory status.  _________________________ 1:51 AM on 07/05/2021 ----------------------------------------- After 1 albuterol treatment patient's symptoms are fully resolved, she is moving air no longer having any chest discomfort.  Most likely bronchospasms however did recommend close follow-up with her cardiologist based on  patient's history.  Discussed my standard return precautions.    _____________________________________________ Please note:  Patient was evaluated in Emergency Department today for the symptoms described in the history of present illness. Patient was evaluated in the context of the global COVID-19 pandemic, which necessitated consideration that the patient might be at risk for infection with the SARS-CoV-2 virus that causes COVID-19. Institutional protocols and algorithms that pertain to the evaluation of patients at risk for COVID-19 are in a state of rapid change based on information released by regulatory bodies including the CDC and federal and state organizations. These policies and algorithms were followed during the patient's care in the ED.  Some ED evaluations and interventions may be delayed as a result of limited staffing during the pandemic.   [place] Controlled Substance Database was reviewed by me. ____________________________________________   FINAL CLINICAL IMPRESSION(S) / ED DIAGNOSES   Final diagnoses:  Chest pain, unspecified type  Bronchospasm      NEW MEDICATIONS STARTED DURING THIS VISIT:  ED Discharge Orders     None        Note:  This document was prepared using Dragon voice recognition software and may include unintentional dictation errors.    Alfred Levins, [place], MD 07/05/21 [phone]

## 2021-07-05 NOTE — Discharge Instructions (Addendum)

## 2021-07-05 NOTE — Telephone Encounter (Signed)
Patient calling to schedule [facility] fu chest pain.    Wants to change provider from [facility] [place] to [place].    Is this ok with you both?

## 2021-07-11 ENCOUNTER — Telehealth: Payer: Self-pay | Admitting: *Deleted

## 2021-07-11 ENCOUNTER — Encounter: Payer: Self-pay | Admitting: *Deleted

## 2021-07-11 DIAGNOSIS — Z955 Presence of coronary angioplasty implant and graft: Secondary | ICD-10-CM

## 2021-07-11 DIAGNOSIS — I214 Non-ST elevation (NSTEMI) myocardial infarction: Secondary | ICD-10-CM

## 2021-07-11 NOTE — Telephone Encounter (Signed)
Pt was supposed to start rehab on 9/12.  She has not called nor shown up for any appointments.  Called to check in.  Left message.

## 2021-07-13 ENCOUNTER — Encounter: Payer: Self-pay | Admitting: Nurse Practitioner

## 2021-07-14 ENCOUNTER — Other Ambulatory Visit: Payer: Self-pay | Admitting: Nurse Practitioner

## 2021-07-14 MED ORDER — PREDNISONE 10 MG PO TABS: 20.0000 mg | ORAL_TABLET | Freq: Every day | ORAL | 0 refills | Status: DC | PRN

## 2021-07-18 ENCOUNTER — Telehealth: Payer: Self-pay

## 2021-07-18 DIAGNOSIS — Z955 Presence of coronary angioplasty implant and graft: Secondary | ICD-10-CM

## 2021-07-18 DIAGNOSIS — I214 Non-ST elevation (NSTEMI) myocardial infarction: Secondary | ICD-10-CM

## 2021-07-18 NOTE — Telephone Encounter (Signed)
Spoke with patient. She has not been able to come to rehab due to her work schedule and will be getting a new schedule next week. She will call us next week to get a new time scheduled.

## 2021-07-19 ENCOUNTER — Telehealth: Payer: Self-pay

## 2021-07-19 NOTE — Telephone Encounter (Signed)
Will call next week with schedule

## 2021-07-22 NOTE — Progress Notes (Signed)
Cardiology Office Note  Date:  07/24/2021   ID:  Holly Garner, DOB May 23, 1981, MRN [id number]  PCP:  Michela Pitcher, NP   Chief Complaint  Patient presents with   Follow-up    3-4 months    HPI:  40 year old female with a history of  CAD, PCI 04/2021 DES to occluded RCA hypertension,  hyperlipidemia,  Morbid obesity,  tobacco abuse, now vaps diabetes who presents for follow-up of CAD.  Hospitalized in July 2022  with progressive chest pain and non-STEMI.  Found to have an occluded right coronary artery as well as moderate, nonobstructive LAD and circumflex disease.  EF 60-65%.  The RCA was treated with drug-eluting stent.  A1c of 10.8  Has had some shortness of breath episodes since discharge ER 07/05/2021  mild episode of chest tightness, shortness of breath which lasted about 20 minutes and resolved without intervention.  Had recurrent symptoms that evening After 1 albuterol treatment in the ER, patient's symptoms  fully resolved, she is moving air no longer having any chest discomfort.  Most likely bronchospasms it was felt by the emergency room  Reports she needs refill on her albuterol Does not have nitro Continues to have significant sinus congestion, cough, shortness of breath at rest Lungs feel tight  EKG personally reviewed by myself on todays visit Normal sinus rhythm with rate 96 bpm, T wave abnormality inferior, anterolateral leads, seen previously    PMH:   has a past medical history of BMI 50.0-59.9, adult ([facility]), CAD (coronary artery disease), Essential hypertension, High cholesterol, History of echocardiogram, History of Papanicolaou smear of cervix (04/2011), 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  PSH:    Past Surgical History:  Procedure Laterality Date   COLPOSCOPY  09/28/2009   CORONARY STENT INTERVENTION N/A 05/04/2021   Procedure: CORONARY STENT INTERVENTION;  Surgeon: Wellington Hampshire, MD;  Location: [facility];  Service: Cardiovascular;  Laterality: N/A;   LEFT HEART CATH AND CORONARY ANGIOGRAPHY N/A 05/04/2021   Procedure: LEFT HEART CATH AND CORONARY ANGIOGRAPHY;  Surgeon: Wellington Hampshire, MD;  Location: [facility];  Service: Cardiovascular;  Laterality: N/A;   TONSILLECTOMY AND ADENOIDECTOMY     TYMPANOSTOMY TUBE PLACEMENT Bilateral     Current Outpatient Medications  Medication Sig Dispense Refill   ACCU-CHEK GUIDE test strip USE TO TEST BLOOD SUGARS UP TO FOUR TIMES DAILY 100 each 1   Accu-Chek Softclix Lancets lancets USE TO TEST BLOOD SUGARS UP TO FOUR TIMES DAILY 100 each 1   aspirin 81 MG chewable tablet Chew 1 tablet (81 mg total) by mouth daily. 30 tablet 0   atorvastatin (LIPITOR) 80 MG tablet Take 1 tablet (80 mg total) by mouth daily. 90 tablet 0   blood glucose meter kit and supplies KIT Dispense based on patient and insurance preference. Use up to four times daily as directed. 1 each 0   Continuous Blood Gluc Sensor (FREESTYLE LIBRE 2 SENSOR) MISC 1 each by Does not apply route as directed. 2 each 2   dapagliflozin propanediol (FARXIGA) 5 MG TABS tablet Take 1 tablet (5 mg total) by mouth daily before breakfast. 30 tablet 2   EPINEPHrine (EPIPEN 2-PAK) 0.3 mg/0.3 mL IJ SOAJ injection Inject 0.3 mLs (0.3 mg total) into the muscle once. 2 Device 0   ezetimibe (ZETIA) 10 MG tablet Take 1 tablet (10 mg total) by mouth daily. 90 tablet 3   insulin glargine (  LANTUS SOLOSTAR) 100 UNIT/ML Solostar Pen Inject 40 Units into the skin daily. STOP levemir 15 mL 1   Insulin Pen Needle (PEN NEEDLES) 30G X 8 MM MISC 1 each by Does not apply route daily. 100 each 3   metFORMIN (GLUCOPHAGE) 1000 MG tablet Take 1 tablet (1,000 mg total) by mouth 2 (two) times daily with a meal. 180 tablet 3   predniSONE (DELTASONE) 10 MG tablet Take 2 tablets (20 mg total) by mouth daily as needed. 4 tablet 0   ticagrelor (BRILINTA) 90 MG TABS tablet Take 1  tablet (90 mg total) by mouth 2 (two) times daily. 180 tablet 0   losartan (COZAAR) 25 MG tablet Take 1 tablet (25 mg total) by mouth daily. 90 tablet 3   metoprolol succinate (TOPROL-XL) 25 MG 24 hr tablet Take 1 tablet (25 mg total) by mouth daily. 90 tablet 3   misoprostol (CYTOTEC) 100 MCG tablet Take 1 tablet (100 mcg total) by mouth once for 1 dose. 1 hour before appt 1 tablet 0   nitroGLYCERIN (NITROSTAT) 0.4 MG SL tablet Place 1 tablet (0.4 mg total) under the tongue every 5 (five) minutes as needed for chest pain. 25 tablet 3   PROAIR HFA 108 (90 Base) MCG/ACT inhaler Inhale 1-2 puffs into the lungs every 6 (six) hours as needed for wheezing or shortness of breath. 8.5 g 1   No current facility-administered medications for this visit.     Allergies:   Contrast media [iodinated diagnostic agents]   Social History:  The patient  reports that she quit smoking about 2 months ago. Her smoking use included cigarettes. She has a 10.00 pack-year smoking history. She has never used smokeless tobacco. She reports that she does not drink alcohol and does not use drugs.   Family History:   family history includes Breast cancer in her maternal grandmother; Diabetes in her father and paternal grandfather.    Review of Systems: Review of Systems  Constitutional: Negative.   HENT: Negative.    Respiratory:  Positive for shortness of breath.   Cardiovascular:  Positive for chest pain.  Gastrointestinal: Negative.   Musculoskeletal: Negative.   Neurological: Negative.   Psychiatric/Behavioral: Negative.    All other systems reviewed and are negative.   PHYSICAL EXAM: VS:  BP 114/82   Pulse 96   Ht 5' 5"  (1.651 m)   Wt 293 lb 12.8 oz (133.3 kg)   SpO2 98%   BMI 48.89 kg/m  , BMI Body mass index is 48.89 kg/m. GEN: Well nourished, well developed, in no acute distress HEENT: normal Neck: no JVD, carotid bruits, or masses Cardiac: RRR; no murmurs, rubs, or gallops,no edema   Respiratory:  clear to auscultation bilaterally, normal work of breathing GI: soft, nontender, nondistended, + BS MS: no deformity or atrophy Skin: warm and dry, no rash Neuro:  Strength and sensation are intact Psych: euthymic mood, full affect  Recent Labs: 06/29/2021: TSH 1.50 07/04/2021: ALT 18; BUN 19; Creatinine, Ser 0.74; Hemoglobin 12.5; Platelets 424; Potassium 3.5; Sodium 139    Lipid Panel Lab Results  Component Value Date   CHOL 163 06/15/2021   HDL 36 (L) 06/15/2021   LDLCALC 105 (H) 06/15/2021   TRIG 123 06/15/2021      Wt Readings from Last 3 Encounters:  07/24/21 293 lb 12.8 oz (133.3 kg)  06/29/21 299 lb (135.6 kg)  06/19/21 298 lb (135.2 kg)       ASSESSMENT AND PLAN:  Problem List Items Addressed  This Visit       Cardiology Problems   HTN (hypertension)   Relevant Medications   nitroGLYCERIN (NITROSTAT) 0.4 MG SL tablet   losartan (COZAAR) 25 MG tablet   metoprolol succinate (TOPROL-XL) 25 MG 24 hr tablet     Other   Morbid obesity (HCC)   Type 2 diabetes mellitus with other specified complication (HCC)   Relevant Medications   losartan (COZAAR) 25 MG tablet   Other Relevant Orders   EKG 12-Lead   Tobacco abuse   Other Visit Diagnoses     Coronary artery disease of native artery of native heart with stable angina pectoris (HCC)    -  Primary   Relevant Medications   nitroGLYCERIN (NITROSTAT) 0.4 MG SL tablet   losartan (COZAAR) 25 MG tablet   metoprolol succinate (TOPROL-XL) 25 MG 24 hr tablet   Other Relevant Orders   EKG 12-Lead   Hyperlipidemia LDL goal <70       Relevant Medications   nitroGLYCERIN (NITROSTAT) 0.4 MG SL tablet   losartan (COZAAR) 25 MG tablet   metoprolol succinate (TOPROL-XL) 25 MG 24 hr tablet      Shortness of breath/chest tightness Having allergies, difficulty breathing, sounds " tight" sitting on exam table, trying to talk.  Concern for bronchospasm, Continues to use vapor cigarettes, prior history of  smoking for several decades Albuterol refilled Recommend she talk with primary care, may need a long-acting combination inhaler such as Symbicort If symptoms persist may need to change her metoprolol  Chest tightness Atypical in nature, happening more at rest, with trouble breathing In the ER for similar symptoms symptoms relieved with albuterol nebulizer Nitro prescribed in effort to avoid unnecessary trips to the emergency room Also with albuterol, Has appointment with primary care next week If symptoms persist may need pulmonary consultation Recommend she consider allergy medication given sinus congestion, coughing (frequent on her visit today)  Coronary disease with stable angina Stent placed to distal RCA Some residual disease noted on catheterization Recommend she stop vaping, cholesterol at goal, Lifestyle modification and weight loss recommended     Total encounter time more than 35 minutes  Greater than 50% was spent in counseling and coordination of care with the patient    Signed, Esmond Plants, M.D., Ph.D. [place], [place]

## 2021-07-24 ENCOUNTER — Encounter: Payer: Self-pay | Admitting: Cardiovascular Disease

## 2021-07-24 ENCOUNTER — Ambulatory Visit: Payer: Medicaid Other | Admitting: Cardiovascular Disease

## 2021-07-24 ENCOUNTER — Other Ambulatory Visit: Payer: Self-pay

## 2021-07-24 VITALS — BP 114/82 | HR 96 | Ht 65.0 in | Wt 293.8 lb

## 2021-07-24 DIAGNOSIS — E1169 Type 2 diabetes mellitus with other specified complication: Secondary | ICD-10-CM

## 2021-07-24 DIAGNOSIS — E785 Hyperlipidemia, unspecified: Secondary | ICD-10-CM | POA: Diagnosis not present

## 2021-07-24 DIAGNOSIS — Z720 Tobacco use: Secondary | ICD-10-CM

## 2021-07-24 DIAGNOSIS — I10 Essential (primary) hypertension: Secondary | ICD-10-CM

## 2021-07-24 DIAGNOSIS — I25118 Atherosclerotic heart disease of native coronary artery with other forms of angina pectoris: Secondary | ICD-10-CM

## 2021-07-24 MED ORDER — PROAIR HFA 108 (90 BASE) MCG/ACT IN AERS: 1.0000 | INHALATION_SPRAY | Freq: Four times a day (QID) | RESPIRATORY_TRACT | 1 refills | Status: DC | PRN

## 2021-07-24 MED ORDER — NITROGLYCERIN 0.4 MG SL SUBL: 0.4000 mg | SUBLINGUAL_TABLET | SUBLINGUAL | 3 refills | Status: DC | PRN

## 2021-07-24 MED ORDER — PROAIR HFA 108 (90 BASE) MCG/ACT IN AERS: 1.0000 | INHALATION_SPRAY | Freq: Four times a day (QID) | RESPIRATORY_TRACT | 4 refills | Status: DC | PRN

## 2021-07-24 MED ORDER — LOSARTAN POTASSIUM 25 MG PO TABS: 25.0000 mg | ORAL_TABLET | Freq: Every day | ORAL | 3 refills | Status: DC

## 2021-07-24 MED ORDER — METOPROLOL SUCCINATE ER 25 MG PO TB24: 25.0000 mg | ORAL_TABLET | Freq: Every day | ORAL | 3 refills | Status: DC

## 2021-07-24 NOTE — Patient Instructions (Addendum)
Medication Instructions:  Albuterol inhaler as needed NTG SL as needed  If you need a refill on your cardiac medications before your next appointment, please call your pharmacy.    Lab work: No new labs needed   If you have labs (blood work) drawn today and your tests are completely normal, you will receive your results only by: MyChart Message (if you have MyChart) OR A paper copy in the mail If you have any lab test that is abnormal or we need to change your treatment, we will call you to review the results.   Testing/Procedures: No new testing needed   Follow-Up: At [facility], you and your health needs are our priority.  As part of our continuing mission to provide you with exceptional heart care, we have created designated Provider Care Teams.  These Care Teams include your primary Cardiologist (physician) and Advanced Practice Providers (APPs -  Physician Assistants and Nurse Practitioners) who all work together to provide you with the care you need, when you need it.  You will need a follow up appointment in 6 months  Providers on your designated Care Team:   Nicolasa Ducking, NP Eula Listen, PA-C Marisue Ivan, PA-C Cadence Fransico Michael, [place]  Any Other Special Instructions Will Be Listed Below (If Applicable).  COVID-19 Vaccine Information can be found at: [email or web address] For questions related to vaccine distribution or appointments, please email [email or web address] or call [phone].

## 2021-07-26 ENCOUNTER — Encounter: Payer: Self-pay | Admitting: *Deleted

## 2021-07-26 DIAGNOSIS — I214 Non-ST elevation (NSTEMI) myocardial infarction: Secondary | ICD-10-CM

## 2021-07-26 DIAGNOSIS — Z955 Presence of coronary angioplasty implant and graft: Secondary | ICD-10-CM

## 2021-07-26 NOTE — Progress Notes (Signed)
Cardiac Individual Treatment Plan  Patient Details  Name: Holly Garner MRN: [id number] Date of Birth: 1981-10-10 Referring Provider:   Flowsheet Row Cardiac Rehab from 06/19/2021 in [facility] Cardiac and Pulmonary Rehab  Referring Provider Kathlyn Sacramento MD  [facility] Cardiologist: Dr. Aaron Edelman Agbor-Etang]       Initial Encounter Date:  Flowsheet Row Cardiac Rehab from 06/19/2021 in [facility] Cardiac and Pulmonary Rehab  Date 06/19/21       Visit Diagnosis: NSTEMI (non-ST elevated myocardial infarction) [facility])  Status post coronary artery stent placement  Patient's Home Medications on Admission:  Current Outpatient Medications:    ACCU-CHEK GUIDE test strip, USE TO TEST BLOOD SUGARS UP TO FOUR TIMES DAILY, Disp: 100 each, Rfl: 1   Accu-Chek Softclix Lancets lancets, USE TO TEST BLOOD SUGARS UP TO FOUR TIMES DAILY, Disp: 100 each, Rfl: 1   aspirin 81 MG chewable tablet, Chew 1 tablet (81 mg total) by mouth daily., Disp: 30 tablet, Rfl: 0   atorvastatin (LIPITOR) 80 MG tablet, Take 1 tablet (80 mg total) by mouth daily., Disp: 90 tablet, Rfl: 0   blood glucose meter kit and supplies KIT, Dispense based on patient and insurance preference. Use up to four times daily as directed., Disp: 1 each, Rfl: 0   Continuous Blood Gluc Sensor (FREESTYLE LIBRE 2 SENSOR) MISC, 1 each by Does not apply route as directed., Disp: 2 each, Rfl: 2   dapagliflozin propanediol (FARXIGA) 5 MG TABS tablet, Take 1 tablet (5 mg total) by mouth daily before breakfast., Disp: 30 tablet, Rfl: 2   EPINEPHrine (EPIPEN 2-PAK) 0.3 mg/0.3 mL IJ SOAJ injection, Inject 0.3 mLs (0.3 mg total) into the muscle once., Disp: 2 Device, Rfl: 0   ezetimibe (ZETIA) 10 MG tablet, Take 1 tablet (10 mg total) by mouth daily., Disp: 90 tablet, Rfl: 3   insulin glargine (LANTUS SOLOSTAR) 100 UNIT/ML Solostar Pen, Inject 40 Units into the skin daily. STOP levemir, Disp: 15 mL, Rfl: 1   Insulin Pen Needle (PEN NEEDLES) 30G X 8 MM MISC, 1 each by  Does not apply route daily., Disp: 100 each, Rfl: 3   losartan (COZAAR) 25 MG tablet, Take 1 tablet (25 mg total) by mouth daily., Disp: 90 tablet, Rfl: 3   metFORMIN (GLUCOPHAGE) 1000 MG tablet, Take 1 tablet (1,000 mg total) by mouth 2 (two) times daily with a meal., Disp: 180 tablet, Rfl: 3   metoprolol succinate (TOPROL-XL) 25 MG 24 hr tablet, Take 1 tablet (25 mg total) by mouth daily., Disp: 90 tablet, Rfl: 3   misoprostol (CYTOTEC) 100 MCG tablet, Take 1 tablet (100 mcg total) by mouth once for 1 dose. 1 hour before appt, Disp: 1 tablet, Rfl: 0   nitroGLYCERIN (NITROSTAT) 0.4 MG SL tablet, Place 1 tablet (0.4 mg total) under the tongue every 5 (five) minutes as needed for chest pain., Disp: 25 tablet, Rfl: 3   predniSONE (DELTASONE) 10 MG tablet, Take 2 tablets (20 mg total) by mouth daily as needed., Disp: 4 tablet, Rfl: 0   PROAIR HFA 108 (90 Base) MCG/ACT inhaler, Inhale 1-2 puffs into the lungs every 6 (six) hours as needed for wheezing or shortness of breath., Disp: 8.5 g, Rfl: 1   ticagrelor (BRILINTA) 90 MG TABS tablet, Take 1 tablet (90 mg total) by mouth 2 (two) times daily., Disp: 180 tablet, Rfl: 0  Past Medical History: Past Medical History:  Diagnosis Date   BMI 50.0-59.9, adult ([place])    CAD (coronary artery disease)  a. 04/2021 NSTEMI/PCI: LM nl, LAD 71m 30d, D1 nl, LCX 644mOM1 min irregs, RCA 20p, 30p/m, 100d (2.5x26 Resolute Onyx DES).   Essential hypertension    High cholesterol    History of echocardiogram    a. 04/2021 Echo: EF 60-65%. No rwma. Nl RV fxn.   History of Papanicolaou smear of cervix 04/2011   normal per pt.   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   hgsil   Tobacco abuse    Type II diabetes mellitus (HCC)     Tobacco Use: Social History   Tobacco Use  Smoking Status Former   Packs/day: 1.00   Years: 10.00   Pack years: 10.00   Types: Cigarettes   Quit date: 05/03/2021   Years since quitting: 0.2  Smokeless Tobacco Never     Labs: Recent Review Flowsheet Data     Labs for ITP Cardiac and Pulmonary Rehab Latest Ref Rng & Units 04/28/2019 07/23/2019 05/03/2021 05/04/2021 06/15/2021   Cholestrol 100 - 199 mg/dL 226(H) - - 193 163   LDLCALC 0 - 99 mg/dL 160(H) - - 130(H) 105(H)   HDL >39 mg/dL 29.40(L) - - 26(L) 36(L)   Trlycerides 0 - 149 mg/dL 185.0(H) - - 184(H) 123   Hemoglobin A1c 4.8 - 5.6 % - 8.5(A) 10.8(H) - -        Exercise Target Goals: Exercise Program Goal: Individual exercise prescription set using results from initial 6 min walk test and THRR while considering  patient's activity barriers and safety.   Exercise Prescription Goal: Initial exercise prescription builds to 30-45 minutes a day of aerobic activity, 2-3 days per week.  Home exercise guidelines will be given to patient during program as part of exercise prescription that the participant will acknowledge.   Education: Aerobic Exercise: - Group verbal and visual presentation on the components of exercise prescription. Introduces F.I.T.T principle from ACSM for exercise prescriptions.  Reviews F.I.T.T. principles of aerobic exercise including progression. Written material given at graduation. Flowsheet [facility]  Education need identified 06/19/21       Education: Resistance Exercise: - Group verbal and visual presentation on the components of exercise prescription. Introduces F.I.T.T principle from ACSM for exercise prescriptions  Reviews F.I.T.T. principles of resistance exercise including progression. Written material given at graduation.    Education: Exercise & Equipment Safety: - Individual verbal instruction and demonstration of equipment use and safety with use of the equipment. [facility]  Date 06/19/21  [facility] Code 1- Verbalizes Understanding       Education: Exercise  Physiology & General Exercise Guidelines: - Group verbal and written instruction with models to review the exercise physiology of the cardiovascular system and associated critical values. Provides general exercise guidelines with specific guidelines to those with heart or lung disease.    Education: Flexibility, Balance, Mind/Body Relaxation: - Group verbal and visual presentation with interactive activity on the components of exercise prescription. Introduces F.I.T.T principle from ACSM for exercise prescriptions. Reviews F.I.T.T. principles of flexibility and balance exercise training including progression. Also discusses the mind body connection.  Reviews various relaxation techniques to help reduce and manage stress (i.e. Deep breathing, progressive muscle relaxation, and visualization). Balance handout provided to take home. Written material given at graduation.   Activity Barriers & Risk Stratification:  Activity Barriers & Cardiac Risk Stratification - 06/19/21 [id number]  Activity Barriers & Cardiac Risk Stratification   Activity Barriers Deconditioning;Muscular Weakness    Cardiac Risk Stratification Moderate             6 Minute Walk:  6 Minute Walk     Row Name 06/19/21 0940         6 Minute Walk   Phase Initial     Distance 1130 feet     Walk Time 6 minutes     # of Rest Breaks 0     MPH 2.14     METS 3.78     RPE 7     VO2 Peak 13.21     Symptoms No     Resting HR 72 bpm     Resting BP 146/76     Resting Oxygen Saturation  98 %     Exercise Oxygen Saturation  during 6 min walk 96 %     Max Ex. HR 131 bpm     Max Ex. BP 156/76     2 Minute Post BP 126/64              Oxygen Initial Assessment:   Oxygen Re-Evaluation:   Oxygen Discharge (Final Oxygen Re-Evaluation):   Initial Exercise Prescription:  Initial Exercise Prescription - 06/19/21 1000       Date of Initial Exercise RX and Referring Provider   Date 06/19/21    Referring Provider  Kathlyn Sacramento MD   Primary Cardiologist: Dr. Kate Sable     Treadmill   MPH 2.5    Grade 1    Minutes 15    METs 3.26      Recumbant Bike   Level 4    RPM 50    Watts 76    Minutes 15    METs 3.5      Elliptical   Level 1    Speed 3    Minutes 15    METs 3      T5 Nustep   Level 3    SPM 80    Minutes 15    METs 3.5      Prescription Details   Frequency (times per week) 3    Duration Progress to 30 minutes of continuous aerobic without signs/symptoms of physical distress      Intensity   THRR 40-80% of Max Heartrate 116-161    Ratings of Perceived Exertion 11-13    Perceived Dyspnea 0-4      Progression   Progression Continue to progress workloads to maintain intensity without signs/symptoms of physical distress.      Resistance Training   Training Prescription Yes    Weight 4 lb    Reps 10-15             Perform Capillary Blood Glucose checks as needed.  Exercise Prescription Changes:   Exercise Prescription Changes     Row Name 06/19/21 1000             Response to Exercise   Blood Pressure (Admit) 146/76       Blood Pressure (Exercise) 156/78       Blood Pressure (Exit) 126/64       Heart Rate (Admit) 72 bpm       Heart Rate (Exercise) 131 bpm       Heart Rate (Exit) 75 bpm       Oxygen Saturation (Admit) 98 %       Oxygen Saturation (Exercise) 96 %  Rating of Perceived Exertion (Exercise) 7       Symptoms none       Comments walk test results                Exercise Comments:   Exercise Goals and Review:   Exercise Goals     Row Name 06/19/21 1017             Exercise Goals   Increase Strength and Stamina Yes       Intervention Provide advice, education, support and counseling about physical activity/exercise needs.;Develop an individualized exercise prescription for aerobic and resistive training based on initial evaluation findings, risk stratification, comorbidities and participant's personal goals.        Expected Outcomes Short Term: Increase workloads from initial exercise prescription for resistance, speed, and METs.;Short Term: Perform resistance training exercises routinely during rehab and add in resistance training at home;Long Term: Improve cardiorespiratory fitness, muscular endurance and strength as measured by increased METs and functional capacity (6MWT)       Able to understand and use rate of perceived exertion (RPE) scale Yes       Intervention Provide education and explanation on how to use RPE scale       Expected Outcomes Short Term: Able to use RPE daily in rehab to express subjective intensity level;Long Term:  Able to use RPE to guide intensity level when exercising independently       Able to understand and use Dyspnea scale Yes       Intervention Provide education and explanation on how to use Dyspnea scale       Expected Outcomes Long Term: Able to use Dyspnea scale to guide intensity level when exercising independently;Short Term: Able to use Dyspnea scale daily in rehab to express subjective sense of shortness of breath during exertion       Knowledge and understanding of Target Heart Rate Range (THRR) Yes       Intervention Provide education and explanation of THRR including how the numbers were predicted and where they are located for reference       Expected Outcomes Short Term: Able to state/look up THRR;Short Term: Able to use daily as guideline for intensity in rehab;Long Term: Able to use THRR to govern intensity when exercising independently       Able to check pulse independently Yes       Intervention Provide education and demonstration on how to check pulse in carotid and radial arteries.;Review the importance of being able to check your own pulse for safety during independent exercise       Expected Outcomes Short Term: Able to explain why pulse checking is important during independent exercise;Long Term: Able to check pulse independently and accurately        Understanding of Exercise Prescription Yes       Intervention Provide education, explanation, and written materials on patient's individual exercise prescription       Expected Outcomes Short Term: Able to explain program exercise prescription;Long Term: Able to explain home exercise prescription to exercise independently                Exercise Goals Re-Evaluation :   Discharge Exercise Prescription (Final Exercise Prescription Changes):  Exercise Prescription Changes - 06/19/21 1000       Response to Exercise   Blood Pressure (Admit) 146/76    Blood Pressure (Exercise) 156/78    Blood Pressure (Exit) 126/64    Heart Rate (Admit) 72 bpm  Heart Rate (Exercise) 131 bpm    Heart Rate (Exit) 75 bpm    Oxygen Saturation (Admit) 98 %    Oxygen Saturation (Exercise) 96 %    Rating of Perceived Exertion (Exercise) 7    Symptoms none    Comments walk test results             Nutrition:  Target Goals: Understanding of nutrition guidelines, daily intake of sodium <1587m, cholesterol <2065m calories 30% from fat and 7% or less from saturated fats, daily to have 5 or more servings of fruits and vegetables.  Education: All About Nutrition: -Group instruction provided by verbal, written material, interactive activities, discussions, models, and posters to present general guidelines for heart healthy nutrition including fat, fiber, MyPlate, the role of sodium in heart healthy nutrition, utilization of the nutrition label, and utilization of this knowledge for meal planning. Follow up email sent as well. Written material given at graduation. [facility]  Education need identified 06/19/21       Biometrics:  Pre Biometrics - 06/19/21 1017       Pre Biometrics   Height 5' 6"  (1.676 m)    Weight 297 lb 6.4 oz (134.9 kg)    BMI (Calculated) 48.02    Single Leg Stand 30 seconds              Nutrition  Therapy Plan and Nutrition Goals:  Nutrition Therapy & Goals - 06/19/21 1019       Intervention Plan   Intervention Prescribe, educate and counsel regarding individualized specific dietary modifications aiming towards targeted core components such as weight, hypertension, lipid management, diabetes, heart failure and other comorbidities.    Expected Outcomes Short Term Goal: Understand basic principles of dietary content, such as calories, fat, sodium, cholesterol and nutrients.;Long Term Goal: Adherence to prescribed nutrition plan.;Short Term Goal: A plan has been developed with personal nutrition goals set during dietitian appointment.             Nutrition Assessments:  MEDIFICTS Score Key: ?70 Need to make dietary changes  40-70 Heart Healthy Diet ? 40 Therapeutic Level Cholesterol Diet  [facility]  Picture Your Plate Total Score on Admission 75      Picture Your Plate Scores: <4<79nhealthy dietary pattern with much room for improvement. 41-50 Dietary pattern unlikely to meet recommendations for good health and room for improvement. 51-60 More healthful dietary pattern, with some room for improvement.  >60 Healthy dietary pattern, although there may be some specific behaviors that could be improved.    Nutrition Goals Re-Evaluation:   Nutrition Goals Discharge (Final Nutrition Goals Re-Evaluation):   Psychosocial: Target Goals: Acknowledge presence or absence of significant depression and/or stress, maximize coping skills, provide positive support system. Participant is able to verbalize types and ability to use techniques and skills needed for reducing stress and depression.   Education: Stress, Anxiety, and Depression - Group verbal and visual presentation to define topics covered.  Reviews how body is impacted by stress, anxiety, and depression.  Also discusses healthy ways to reduce stress and to  treat/manage anxiety and depression.  Written material given at graduation.   Education: Sleep Hygiene -Provides group verbal and written instruction about how sleep can affect your health.  Define sleep hygiene, discuss sleep cycles and impact of sleep habits. Review good sleep hygiene tips.    Initial Review & Psychosocial  Screening:  Initial Psych Review & Screening - 05/26/21 1410       Initial Review   Current issues with Current Sleep Concerns;Current Stress Concerns    Source of Stress Concerns Chronic Illness      Family Dynamics   Good Support System? Yes   husband, kids, parents     Barriers   Psychosocial barriers to participate in program There are no identifiable barriers or psychosocial needs.;The patient should benefit from training in stress management and relaxation.      Screening Interventions   Interventions Encouraged to exercise;Provide feedback about the scores to participant;To provide support and resources with identified psychosocial needs    Expected Outcomes Short Term goal: Utilizing psychosocial counselor, staff and physician to assist with identification of specific Stressors or current issues interfering with healing process. Setting desired goal for each stressor or current issue identified.;Long Term Goal: Stressors or current issues are controlled or eliminated.;Short Term goal: Identification and review with participant of any Quality of Life or Depression concerns found by scoring the questionnaire.;Long Term goal: The participant improves quality of Life and PHQ9 Scores as seen by post scores and/or verbalization of changes             Quality of Life Scores:   Quality of Life - 06/19/21 1018       Quality of Life   Select Quality of Life      Quality of Life Scores   Health/Function Pre 29.6 %    Socioeconomic Pre 30 %    Psych/Spiritual Pre 30 %    Family Pre 30 %    GLOBAL Pre 29.82 %            Scores of 19 and below usually  indicate a poorer quality of life in these areas.  A difference of  2-3 points is a clinically meaningful difference.  A difference of 2-3 points in the total score of the Quality of Life Index has been associated with significant improvement in overall quality of life, self-image, physical symptoms, and general health in studies assessing change in quality of life.  PHQ-9: Recent Review Flowsheet Data     Depression screen [facility] 2/9 06/19/2021 05/24/2021 03/24/2019   Decreased Interest 0 0 0   Down, Depressed, Hopeless 0 0 0   PHQ - 2 Score 0 0 0   Altered sleeping 2 - -   Tired, decreased energy 1 - -   Change in appetite 0 - -   Feeling bad or failure about yourself  0 - -   Trouble concentrating 0 - -   Moving slowly or fidgety/restless 0 - -   Suicidal thoughts 0 - -   PHQ-9 Score 3 - -   Difficult doing work/chores Not difficult at all - -      Interpretation of Total Score  Total Score Depression Severity:  1-4 = Minimal depression, 5-9 = Mild depression, 10-14 = Moderate depression, 15-19 = Moderately severe depression, 20-27 = Severe depression   Psychosocial Evaluation and Intervention:  Psychosocial Evaluation - 05/26/21 1423       Psychosocial Evaluation & Interventions   Interventions Stress management education;Encouraged to exercise with the program and follow exercise prescription    Comments Pete is coming to cardiac rehab bost MI and stents. While she was in the hospital, she was diagnosed with diabetes. She is seeing a diabetes educator to learn more about living with diabetes. She also quit smoking when she was admitted and  so did her husband. Together, they both feel encouraged to keep each other accountable. She thinks her MI was exacerbated by her son's friend's car accident that killed 3 people. She found out later that her son was supposed to be in the car, so although she is very thankful he was not it is stressful to think about and going through the grieving  process for those teens. She states her husband, parents, and children are her main support system and they have been very present during her recovery. 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 She is excited about starting cardiac rehab to learn more about heart healthy living, including managing her diabetes and risk factors.    Expected Outcomes Short: attend cardiac rehab for education and exercise. Long: develop and maintain positive self care habits.    Continue Psychosocial Services  Follow up required by staff             Psychosocial Re-Evaluation:   Psychosocial Discharge (Final Psychosocial Re-Evaluation):   Vocational Rehabilitation: Provide vocational rehab assistance to qualifying candidates.   Vocational Rehab Evaluation & Intervention:  Vocational Rehab - 05/26/21 1413       Initial Vocational Rehab Evaluation & Intervention   Assessment shows need for Vocational Rehabilitation No             Education: Education Goals: Education classes will be provided on a variety of topics geared toward better understanding of heart health and risk factor modification. Participant will state understanding/return demonstration of topics presented as noted by education test scores.  Learning Barriers/Preferences:  Learning Barriers/Preferences - 05/26/21 1413       Learning Barriers/Preferences   Learning Barriers None    Learning Preferences None             General Cardiac Education Topics:  AED/CPR: - Group verbal and written instruction with the use of models to demonstrate the basic use of the AED with the basic ABC's of resuscitation.   Anatomy and Cardiac Procedures: - Group verbal and visual presentation and models provide information about basic cardiac anatomy and function. Reviews the testing methods done to diagnose heart disease and the outcomes of the test results.  Describes the treatment choices: Medical Management, Angioplasty, or Coronary Bypass Surgery for treating various heart conditions including Myocardial Infarction, Angina, Valve Disease, and Cardiac Arrhythmias.  Written material given at graduation.   Medication Safety: - Group verbal and visual instruction to review commonly prescribed medications for heart and lung disease. Reviews the medication, class of the drug, and side effects. Includes the steps to properly store meds and maintain the prescription regimen.  Written material given at graduation.   Intimacy: - Group verbal instruction through game format to discuss how heart and lung disease can affect sexual intimacy. Written material given at graduation..   Know Your Numbers and Heart Failure: - Group verbal and visual instruction to discuss disease risk factors for cardiac and pulmonary disease and treatment options.  Reviews associated critical values for Overweight/Obesity, Hypertension, Cholesterol, and Diabetes.  Discusses basics of heart failure: signs/symptoms and treatments.  Introduces Heart Failure Zone chart for action plan for heart failure.  Written material given at graduation. Flowsheet Row Cardiac Rehab from 06/19/2021 in [facility] Cardiac and Pulmonary Rehab  Education need identified 06/19/21       Infection Prevention: - Provides verbal and written material to individual with discussion of infection  control including proper hand washing and proper equipment cleaning during exercise session. Flowsheet Row Cardiac Rehab from 06/19/2021 in [facility] Cardiac and Pulmonary Rehab  Date 06/19/21  Educator [facility]  Instruction Review Code 1- Verbalizes Understanding       Falls Prevention: - Provides verbal and written material to individual with discussion of falls prevention and safety. Flowsheet Row Cardiac Rehab from 06/19/2021 in [facility] Cardiac [facility] Pulmonary Rehab  Date 06/19/21  Educator [facility]  Instruction Review Code 1-  Verbalizes Understanding       Other: -Provides group and verbal instruction on various topics (see comments)   Knowledge Questionnaire Score:  Knowledge Questionnaire Score - 06/19/21 1019       Knowledge Questionnaire Score   Pre Score 22/26 Education: Angina, exericse, nutrition, PAD             Core Components/Risk Factors/Patient Goals at Admission:  Personal Goals and Risk Factors at Admission - 06/19/21 1022       Core Components/Risk Factors/Patient Goals on Admission    Weight Management Yes;Weight Loss;Obesity    Intervention Weight Management: Develop a combined nutrition and exercise program designed to reach desired caloric intake, while maintaining appropriate intake of nutrient and fiber, sodium and fats, and appropriate energy expenditure required for the weight goal.;Weight Management: Provide education and appropriate resources to help participant work on and attain dietary goals.;Weight Management/Obesity: Establish reasonable short term and long term weight goals.;Obesity: Provide education and appropriate resources to help participant work on and attain dietary goals.    Admit Weight 297 lb 6.4 oz (134.9 kg)    Goal Weight: Short Term 290 lb (131.5 kg)    Goal Weight: Long Term 280 lb (127 kg)    Expected Outcomes Short Term: Continue to assess and modify interventions until short term weight is achieved;Long Term: Adherence to nutrition and physical activity/exercise program aimed toward attainment of established weight goal;Weight Loss: Understanding of general recommendations for a balanced deficit meal plan, which promotes 1-2 lb weight loss per week and includes a negative energy balance of [phone] kcal/d;Understanding recommendations for meals to include 15-35% energy as protein, 25-35% energy from fat, 35-60% energy from carbohydrates, less than 263m of dietary cholesterol, 20-35 gm of total fiber daily;Understanding of distribution of calorie intake  throughout the day with the consumption of 4-5 meals/snacks    Tobacco Cessation Yes   quit 05/03/21   Expected Outcomes Short Term: Will quit all tobacco product use, adhering to prevention of relapse plan.;Long Term: Complete abstinence from all tobacco products for at least 12 months from quit date.    Diabetes Yes    Intervention Provide education about signs/symptoms and action to take for hypo/hyperglycemia.;Provide education about proper nutrition, including hydration, and aerobic/resistive exercise prescription along with prescribed medications to achieve blood glucose in normal ranges: Fasting glucose 65-99 mg/dL    Expected Outcomes Short Term: Participant verbalizes understanding of the signs/symptoms and immediate care of hyper/hypoglycemia, proper foot care and importance of medication, aerobic/resistive exercise and nutrition plan for blood glucose control.;Long Term: Attainment of HbA1C < 7%.    Hypertension Yes    Intervention Provide education on lifestyle modifcations including regular physical activity/exercise, weight management, moderate sodium restriction and increased consumption of fresh fruit, vegetables, and low fat dairy, alcohol moderation, and smoking cessation.;Monitor prescription use compliance.    Expected Outcomes Short Term: Continued assessment and intervention until BP is < 140/99mHG in hypertensive participants. < 130/8099mG in hypertensive participants with diabetes, heart failure or chronic  kidney disease.;Long Term: Maintenance of blood pressure at goal levels.    Lipids Yes    Intervention Provide education and support for participant on nutrition & aerobic/resistive exercise along with prescribed medications to achieve LDL <82m, HDL >442m    Expected Outcomes Short Term: Participant states understanding of desired cholesterol values and is compliant with medications prescribed. Participant is following exercise prescription and nutrition guidelines.;Long  Term: Cholesterol controlled with medications as prescribed, with individualized exercise RX and with personalized nutrition plan. Value goals: LDL < 7043mHDL > 40 mg.             Education:Diabetes - Individual verbal and written instruction to review signs/symptoms of diabetes, desired ranges of glucose level fasting, after meals and with exercise. Acknowledge that pre and post exercise glucose checks will be done for 3 sessions at entry of program. [place] [facility]  Date 05/26/21  [facility] 1- Verbalizes Understanding       Core Components/Risk Factors/Patient Goals Review:   Goals and Risk Factor Review     Row Name 06/19/21 1037             Core Components/Risk Factors/Patient Goals Review   Personal Goals Review Tobacco Cessation       Review She has recently quit tobacco use within the last 6 months. Intervention for relapse prevention was provided at the initial medical review. Patient was encouraged to continue to with tobacco cessation and was provided information on relapse prevention. Patient received information about combination therapy, tobacco cessation classes, quit line, and quit smoking apps in case of a relapse. Patient demonstrated understanding of this material.Staff will continue to provide encouragement and follow up with the patient throughout the program.       Expected Outcomes Continued cessation                Core Components/Risk Factors/Patient Goals at Discharge (Final Review):   Goals and Risk Factor Review - 06/19/21 1037       Core Components/Risk Factors/Patient Goals Review   Personal Goals Review Tobacco Cessation    Review She has recently quit tobacco use within the last 6 months. Intervention for relapse prevention was provided at the initial medical review. Patient was encouraged to continue to with tobacco cessation and was provided  information on relapse prevention. Patient received information about combination therapy, tobacco cessation classes, quit line, and quit smoking apps in case of a relapse. Patient demonstrated understanding of this material.Staff will continue to provide encouragement and follow up with the patient throughout the program.    Expected Outcomes Continued cessation             ITP Comments:  ITP Comments     Row Name 05/26/21 1429 06/19/21 0939 06/28/21 0730 07/10/21 1033 07/11/21 1511   ITP Comments Initial telephone orientation completed. [facility] EP orientation scheduled for Thursday 8/11 at 8am. Completed 6MWT and gym orientation. Initial ITP created and sent for review to Dr. MarEmily Filbertedical Director. 30 Day review completed. Medical Director ITP review done, changes made as directed, and signed approval by Medical Director. AmaRoyannes not shown up since her orientation on 8/29. Will follow up with patient to make contact to follow up. Pt was supposed to start rehab on 9/12.  She has not called nor shown up for any appointments.  Called to check in.  Left message.  [place] Name 07/18/21 1110 07/26/21 1223         ITP Comments Spoke with patient. She has not been able to come to rehab due to her work schedule and will be getting a new schedule next week. She will call us next week to get a new time scheduled. Start date TBD. 30 day review completed. ITP sent to Dr. Emily Filbert, Medical Director of Cardiac Rehab. Continue with ITP unless changes are made by physician. Pt has yet to start rehab due to new work schedule.               Comments: 30 day review

## 2021-07-31 ENCOUNTER — Ambulatory Visit: Payer: Medicaid Other | Admitting: Nurse Practitioner

## 2021-07-31 ENCOUNTER — Other Ambulatory Visit: Payer: Self-pay

## 2021-07-31 ENCOUNTER — Encounter: Payer: Self-pay | Admitting: Nurse Practitioner

## 2021-07-31 VITALS — BP 118/72 | HR 76 | Temp 97.4°F | Resp 12 | Ht 65.0 in | Wt 293.4 lb

## 2021-07-31 DIAGNOSIS — E7800 Pure hypercholesterolemia, unspecified: Secondary | ICD-10-CM

## 2021-07-31 DIAGNOSIS — E1165 Type 2 diabetes mellitus with hyperglycemia: Secondary | ICD-10-CM | POA: Diagnosis not present

## 2021-07-31 DIAGNOSIS — E1169 Type 2 diabetes mellitus with other specified complication: Secondary | ICD-10-CM | POA: Diagnosis not present

## 2021-07-31 DIAGNOSIS — R609 Edema, unspecified: Secondary | ICD-10-CM

## 2021-07-31 DIAGNOSIS — J069 Acute upper respiratory infection, unspecified: Secondary | ICD-10-CM | POA: Diagnosis not present

## 2021-07-31 LAB — POCT GLYCOSYLATED HEMOGLOBIN (HGB A1C): Hemoglobin A1C: 7.2 % — AB (ref 4.0–5.6)

## 2021-07-31 MED ORDER — OZEMPIC (0.25 OR 0.5 MG/DOSE) 2 MG/1.5ML ~~LOC~~ SOPN: PEN_INJECTOR | SUBCUTANEOUS | 1 refills | Status: AC

## 2021-07-31 MED ORDER — INSULIN DETEMIR 100 UNIT/ML ~~LOC~~ SOLN: 40.0000 [IU] | Freq: Every day | SUBCUTANEOUS | 3 refills | Status: DC

## 2021-07-31 MED ORDER — PREDNISONE 10 MG PO TABS: 20.0000 mg | ORAL_TABLET | Freq: Every day | ORAL | 0 refills | Status: DC | PRN

## 2021-07-31 NOTE — Assessment & Plan Note (Signed)
Patient here for recheck of diabetes mellitus.  POCT A1c in office was 7.2 a great improvement from 10.8 in the hospital.  Patient has seen cardiology NP Marlis Edelson and he recommends a GLP-1 will stop [place] and start Ozempic we will titrate her up to 0.5 mg currently and see her back in office for A1c check.  Patient states the Lantus burns and hurts when she gives it to herself and prefers not to use that we will switch her back to Levemir 40 units 1 time a day.  Patient has worked on weight loss and lost a total of 50 pounds thus far she will continue lifestyle modifications.  Continue to check her glucose with her continuous glucose monitor.  She will return to the office if persistent low or high blood sugars.

## 2021-07-31 NOTE — Assessment & Plan Note (Signed)
Patient continue to work on lifestyle modifications encouraged same.

## 2021-07-31 NOTE — Progress Notes (Signed)
Established Patient Office Visit  Subjective:  Patient ID: Holly Garner, female    DOB: 02-11-1981  Age: 40 y.o. MRN: [id number]  CC:  Chief Complaint  Patient presents with   Diabetes    Follow up    HPI [name] presents for Diabetes follow up  Checking sugars with CGM. States that her sugars have been 90-150. Few low glucose. 53 was a low sugar. Middle of the day low glucose High glucose 177 states that she splurged   Delsyum and benadryl with minimal relief. Stayed the same symptom wise has been using flonase. Tightness in chest has improved.   Past Medical History:  Diagnosis Date   BMI 50.0-59.9, adult ([place])    CAD (coronary artery disease)    a. 04/2021 NSTEMI/PCI: LM nl, LAD 40m 30d, D1 nl, LCX 667mOM1 min irregs, RCA 20p, 30p/m, 100d (2.5x26 Resolute Onyx DES).   Essential hypertension    High cholesterol    History of echocardiogram    a. 04/2021 Echo: EF 60-65%. No rwma. Nl RV fxn.   History of Papanicolaou smear of cervix 04/2011   normal per pt.   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   hgsil   Tobacco abuse    Type II diabetes mellitus ([facility]    Past Surgical History:  Procedure Laterality Date   COLPOSCOPY  09/28/2009   CORONARY STENT INTERVENTION N/A 05/04/2021   Procedure: CORONARY STENT INTERVENTION;  Surgeon: ArWellington HampshireMD;  [facility];  Service: Cardiovascular;  Laterality: N/A;   LEFT HEART CATH AND CORONARY ANGIOGRAPHY N/A 05/04/2021   Procedure: LEFT HEART CATH AND CORONARY ANGIOGRAPHY;  Surgeon: ArWellington HampshireMD;  [facility];  Service: Cardiovascular;  Laterality: N/A;   TONSILLECTOMY AND ADENOIDECTOMY     TYMPANOSTOMY TUBE PLACEMENT Bilateral     Family History  Problem Relation Age of Onset   Diabetes Father    Breast cancer Maternal Grandmother        late years   Diabetes Paternal Grandfather     Social History   Socioeconomic History   Marital  status: Married    Spouse name: Not on file   Number of children: Not on file   Years of education: 12   Highest education level: Not on file  Occupational History   Occupation: unemployed   Occupation: homemaker  Tobacco Use   Smoking status: Former    Packs/day: 1.00    Years: 10.00    Pack years: 10.00    Types: Cigarettes    Quit date: 05/03/2021    Years since quitting: 0.2   Smokeless tobacco: Never  Vaping Use   Vaping Use: Never used  Substance and Sexual Activity   Alcohol use: No   Drug use: No   Sexual activity: Yes    Birth control/protection: Surgical    Comment: Vasectomy  Other Topics Concern   Not on file  Social History Narrative   Not on file   Social Determinants of Health   Financial Resource Strain: Not on file  Food Insecurity: Not on file  Transportation Needs: Not on file  Physical Activity: Not on file  Stress: Not on file  Social Connections: Not on file  Intimate Partner Violence: Not on file    Outpatient Medications Prior to Visit  Medication Sig Dispense Refill   ACCU-CHEK GUIDE test strip USE TO TEST BLOOD SUGARS UP TO FOUR TIMES DAILY 100 each  1   Accu-Chek Softclix Lancets lancets USE TO TEST BLOOD SUGARS UP TO FOUR TIMES DAILY 100 each 1   aspirin 81 MG chewable tablet Chew 1 tablet (81 mg total) by mouth daily. 30 tablet 0   atorvastatin (LIPITOR) 80 MG tablet Take 1 tablet (80 mg total) by mouth daily. 90 tablet 0   blood glucose meter kit and supplies KIT Dispense based on patient and insurance preference. Use up to four times daily as directed. 1 each 0   Continuous Blood Gluc Sensor (FREESTYLE LIBRE 2 SENSOR) MISC 1 each by Does not apply route as directed. 2 each 2   EPINEPHrine (EPIPEN 2-PAK) 0.3 mg/0.3 mL IJ SOAJ injection Inject 0.3 mLs (0.3 mg total) into the muscle once. 2 Device 0   ezetimibe (ZETIA) 10 MG tablet Take 1 tablet (10 mg total) by mouth daily. 90 tablet 3   Insulin Pen Needle (PEN NEEDLES) 30G X 8 MM MISC 1  each by Does not apply route daily. 100 each 3   losartan (COZAAR) 25 MG tablet Take 1 tablet (25 mg total) by mouth daily. 90 tablet 3   metFORMIN (GLUCOPHAGE) 1000 MG tablet Take 1 tablet (1,000 mg total) by mouth 2 (two) times daily with a meal. 180 tablet 3   metoprolol succinate (TOPROL-XL) 25 MG 24 hr tablet Take 1 tablet (25 mg total) by mouth daily. 90 tablet 3   nitroGLYCERIN (NITROSTAT) 0.4 MG SL tablet Place 1 tablet (0.4 mg total) under the tongue every 5 (five) minutes as needed for chest pain. 25 tablet 3   PROAIR HFA 108 (90 Base) MCG/ACT inhaler Inhale 1-2 puffs into the lungs every 6 (six) hours as needed for wheezing or shortness of breath. 8.5 g 1   ticagrelor (BRILINTA) 90 MG TABS tablet Take 1 tablet (90 mg total) by mouth 2 (two) times daily. 180 tablet 0   dapagliflozin propanediol (FARXIGA) 5 MG TABS tablet Take 1 tablet (5 mg total) by mouth daily before breakfast. 30 tablet 2   insulin glargine (LANTUS SOLOSTAR) 100 UNIT/ML Solostar Pen Inject 40 Units into the skin daily. STOP levemir 15 mL 1   predniSONE (DELTASONE) 10 MG tablet Take 2 tablets (20 mg total) by mouth daily as needed. 4 tablet 0   misoprostol (CYTOTEC) 100 MCG tablet Take 1 tablet (100 mcg total) by mouth once for 1 dose. 1 hour before appt 1 tablet 0   No facility-administered medications prior to visit.    Allergies  Allergen Reactions   Contrast Media [Iodinated Diagnostic Agents] Itching    ROS Review of Systems  Constitutional:  Negative for chills and fever.  HENT:  Positive for congestion and rhinorrhea.   Respiratory:  Positive for cough. Negative for shortness of breath.   Cardiovascular:  Negative for chest pain.  Gastrointestinal:  Negative for nausea and vomiting.  Neurological:  Negative for weakness.     Objective:    Physical Exam Vitals and nursing note reviewed.  Constitutional:      Appearance: She is obese.  HENT:     Head:     Comments: Tube in place in right TM     Right Ear: Ear canal and external ear normal. There is no impacted cerumen.     Left Ear: Tympanic membrane, ear canal and external ear normal. There is no impacted cerumen.     Mouth/Throat:     Mouth: Mucous membranes are moist.  Eyes:     Extraocular Movements: Extraocular movements intact.  Pupils: Pupils are equal, round, and reactive to light.  Cardiovascular:     Rate and Rhythm: Normal rate and regular rhythm.  Pulmonary:     Effort: Pulmonary effort is normal.     Breath sounds: Normal breath sounds.  Abdominal:     General: Bowel sounds are normal.  Musculoskeletal:     Right lower leg: No edema.     Left lower leg: No edema.  Neurological:     General: No focal deficit present.     Mental Status: She is alert.  Psychiatric:        Mood and Affect: Mood normal.        Behavior: Behavior normal.        Thought Content: Thought content normal.        Judgment: Judgment normal.    BP 118/72   Pulse 76   Temp (!) 97.4 F (36.3 C)   Resp 12   Ht 5' 5" (1.651 m)   Wt 293 lb 6 oz (133.1 kg)   SpO2 96%   BMI 48.82 kg/m  Wt Readings from Last 3 Encounters:  07/31/21 293 lb 6 oz (133.1 kg)  07/24/21 293 lb 12.8 oz (133.3 kg)  06/29/21 299 lb (135.6 kg)     Health Maintenance Due  Topic Date Due   Hepatitis C Screening  Never done   TETANUS/TDAP  Never done   FOOT EXAM  07/22/2020   OPHTHALMOLOGY EXAM  08/05/2020   COVID-19 Vaccine (3 - Booster for Pfizer series) 05/18/2021    There are no preventive care reminders to display for this patient.  Lab Results  Component Value Date   TSH 1.50 06/29/2021   Lab Results  Component Value Date   WBC 13.0 (H) 07/04/2021   HGB 12.5 07/04/2021   HCT 40.1 07/04/2021   MCV 81.8 07/04/2021   PLT 424 (H) 07/04/2021   Lab Results  Component Value Date   NA 139 07/04/2021   K 3.5 07/04/2021   CO2 25 07/04/2021   GLUCOSE 175 (H) 07/04/2021   BUN 19 07/04/2021   CREATININE 0.74 07/04/2021   BILITOT 0.5  07/04/2021   ALKPHOS 91 07/04/2021   AST 15 07/04/2021   ALT 18 07/04/2021   PROT 6.9 07/04/2021   ALBUMIN 3.7 07/04/2021   CALCIUM 9.3 07/04/2021   ANIONGAP 11 07/04/2021   GFR 117.88 04/07/2019   Lab Results  Component Value Date   CHOL 163 06/15/2021   Lab Results  Component Value Date   HDL 36 (L) 06/15/2021   Lab Results  Component Value Date   LDLCALC 105 (H) 06/15/2021   Lab Results  Component Value Date   TRIG 123 06/15/2021   Lab Results  Component Value Date   CHOLHDL 4.5 (H) 06/15/2021   Lab Results  Component Value Date   HGBA1C 7.2 (A) 07/31/2021      Assessment & Plan:   Problem List Items Addressed This Visit       Respiratory   Viral upper respiratory tract infection     Endocrine   Type 2 diabetes mellitus with other specified complication (HCC) - Primary   Relevant Medications   insulin detemir (LEVEMIR) 100 UNIT/ML injection   Semaglutide,0.25 or 0.5MG/DOS, (OZEMPIC, 0.25 OR 0.5 MG/DOSE,) 2 MG/1.5ML SOPN   Other Relevant Orders   POCT glycosylated hemoglobin (Hb A1C) (Completed)   Uncontrolled type 2 diabetes mellitus with hyperglycemia ([place])    Patient here for recheck of diabetes mellitus.  POCT A1c  in office was 7.2 a great improvement from 10.8 in the hospital.  Patient has seen cardiology NP Leroy Sea and he recommends a GLP-1 will stop [place] and start Ozempic we will titrate her up to 0.5 mg currently and see her back in office for A1c check.  Patient states the Lantus burns and hurts when she gives it to herself and prefers not to use that we will switch her back to Levemir 40 units 1 time a day.  Patient has worked on weight loss and lost a total of 50 pounds thus far she will continue lifestyle modifications.  Continue to check her glucose with her continuous glucose monitor.  She will return to the office if persistent low or high blood sugars.      Relevant Medications   insulin detemir (LEVEMIR) 100 UNIT/ML injection    Semaglutide,0.25 or 0.5MG/DOS, (OZEMPIC, 0.25 OR 0.5 MG/DOSE,) 2 MG/1.5ML SOPN     Other   Swelling    Still complaining about intermittent swelling of different body parts.  She has been evaluated by evaluated by rheumatology with no clear result or path.  Patient uses prednisone intermittently discouraged regular use we will send in as needed.      Relevant Medications   predniSONE (DELTASONE) 10 MG tablet   Morbid obesity ([place])    Patient continue to work on lifestyle modifications encouraged same.      Relevant Medications   insulin detemir (LEVEMIR) 100 UNIT/ML injection   Semaglutide,0.25 or 0.5MG/DOS, (OZEMPIC, 0.25 OR 0.5 MG/DOSE,) 2 MG/1.5ML SOPN   Pure hypercholesterolemia    Currently managed by cardiology.  Last LDL was 105.  Goal LDL under 70.  Continue taking medications Zetia and atorvastatin as prescribed by cardiology follow-up with them as scheduled       No orders of the defined types were placed in this encounter.   Follow-up: Return in about 4 months (around 12/01/2021) for DM recheck.   This visit occurred during the SARS-CoV-2 public health emergency.  Safety protocols were in place, including screening questions prior to the visit, additional usage of staff PPE, and extensive cleaning of exam room while observing appropriate contact time as indicated for disinfecting solutions.   Romilda Garret, NP

## 2021-07-31 NOTE — Assessment & Plan Note (Signed)
Still complaining about intermittent swelling of different body parts.  She has been evaluated by evaluated by rheumatology with no clear result or path.  Patient uses prednisone intermittently discouraged regular use we will send in as needed.

## 2021-07-31 NOTE — Assessment & Plan Note (Signed)
Currently managed by cardiology.  Last LDL was 105.  Goal LDL under 70.  Continue taking medications Zetia and atorvastatin as prescribed by cardiology follow-up with them as scheduled

## 2021-07-31 NOTE — Patient Instructions (Signed)
Nice to see you today Get in with your eye professional please Will see you in 4 months, sooner if needed

## 2021-08-07 ENCOUNTER — Encounter: Payer: Self-pay | Admitting: *Deleted

## 2021-08-07 ENCOUNTER — Ambulatory Visit: Payer: Medicaid Other | Admitting: Cardiology

## 2021-08-07 ENCOUNTER — Encounter: Payer: Self-pay | Admitting: Nurse Practitioner

## 2021-08-07 ENCOUNTER — Telehealth: Payer: Self-pay | Admitting: Nurse Practitioner

## 2021-08-07 DIAGNOSIS — Z955 Presence of coronary angioplasty implant and graft: Secondary | ICD-10-CM

## 2021-08-07 DIAGNOSIS — I214 Non-ST elevation (NSTEMI) myocardial infarction: Secondary | ICD-10-CM

## 2021-08-07 NOTE — Telephone Encounter (Signed)
Pt called stating that the medication insulin detemir (LEVEMIR) 100 UNIT/ML injection, is giving her headaches. Pt states that she has been taking tylenol but it hasn't  been helping. Please advise.

## 2021-08-08 NOTE — Telephone Encounter (Signed)
See mychart message from the patient also

## 2021-08-09 ENCOUNTER — Other Ambulatory Visit: Payer: Self-pay | Admitting: Nurse Practitioner

## 2021-08-09 DIAGNOSIS — E1169 Type 2 diabetes mellitus with other specified complication: Secondary | ICD-10-CM

## 2021-08-09 MED ORDER — INSULIN GLARGINE 100 UNIT/ML ~~LOC~~ SOLN: 40.0000 [IU] | Freq: Every day | SUBCUTANEOUS | 2 refills | Status: DC

## 2021-08-09 NOTE — Progress Notes (Signed)
Patient states the lantus pen burns and she is experiencing headaches with levemir. Will switch to lantus vial

## 2021-08-14 ENCOUNTER — Other Ambulatory Visit: Payer: Self-pay | Admitting: Family Medicine

## 2021-08-14 ENCOUNTER — Other Ambulatory Visit: Payer: Self-pay | Admitting: Nurse Practitioner

## 2021-08-14 DIAGNOSIS — E119 Type 2 diabetes mellitus without complications: Secondary | ICD-10-CM

## 2021-08-15 ENCOUNTER — Other Ambulatory Visit: Payer: Self-pay | Admitting: Nurse Practitioner

## 2021-08-15 DIAGNOSIS — E1169 Type 2 diabetes mellitus with other specified complication: Secondary | ICD-10-CM

## 2021-08-16 ENCOUNTER — Other Ambulatory Visit: Payer: Self-pay | Admitting: Nurse Practitioner

## 2021-08-16 ENCOUNTER — Encounter: Payer: Self-pay | Admitting: Family Medicine

## 2021-08-16 ENCOUNTER — Telehealth: Payer: Self-pay

## 2021-08-16 DIAGNOSIS — E1169 Type 2 diabetes mellitus with other specified complication: Secondary | ICD-10-CM

## 2021-08-16 MED ORDER — FREESTYLE LIBRE 2 SENSOR MISC: 1.0000 | 11 refills | Status: DC

## 2021-08-16 MED ORDER — METFORMIN HCL 1000 MG PO TABS
1000.0000 mg | ORAL_TABLET | Freq: Two times a day (BID) | ORAL | 0 refills | Status: DC
Start: 2021-08-16 — End: 2021-08-22

## 2021-08-16 NOTE — Telephone Encounter (Signed)
Received message from [facility] see below  "Got strange pharmacy request about her metformin. SHe should be taking 1,000mg  Metformin 1 tablet twice daily with meals. Can we make sure she is doing it that way?   She was on 500mg  tablets at one point and that could have been the directions. But she needs to read the bottle and verify   Metformin 1,000mg  1 Tablet twice a day with food   Thanks,  Matt "  Called patient, advised that patient should be taking 1000 mg 1 tablet twice daily. Patient states she has been taking 2 tabelts in the morning and 2 tablets in the afternoon since she got out of the hospital, she did not know her dose changed becaue per patient "we do not tell her shit." I asked patient if she was taking 500 gm or 1000 mg, patient states she has been taking 1000 mg 2 tablets BID. Patient started to panic and crying stating that she has been overdosing herself not knowing these changes and getting upset and asking what will happen to her know with taking too much of this medication. I consulted with and she is going to speak with the patient. I parked patient on the phone and sent this note and patient to Mayra Reel to discuss further since Jae Dire is not in the office.

## 2021-08-16 NOTE — Telephone Encounter (Signed)
Patient calling to check on status  Wants to speak with Dr Mariah Milling if possible

## 2021-08-16 NOTE — Telephone Encounter (Addendum)
Spoke with patient via phone, clarified that she has been taking metformin 1000 mg, 2 tablets twice daily, total of 4000 mg daily for the last month.  Discussed directions for metformin use, she understands to reduce down to 1000 mg 2 times a day.  She is out of her prescription as she has taken too many tablets, will send in temporary prescription for 1 month supply as this should get her through to her regular metformin prescription.  I offered to order a BMP for kidney function which she kindly declined. She had no further questions.

## 2021-08-17 DIAGNOSIS — I214 Non-ST elevation (NSTEMI) myocardial infarction: Secondary | ICD-10-CM

## 2021-08-17 DIAGNOSIS — Z955 Presence of coronary angioplasty implant and graft: Secondary | ICD-10-CM

## 2021-08-17 NOTE — Telephone Encounter (Signed)
Please see mychart message communications also. This message was sent to me from [facility] on the patient. I will send to [facility] and Sherrie George to see if they can speak with patient.  "We need to call and make sure that the patient medications and our medications are matching since there was a miscommunication.  She should be on the metformin 1000mg  1 tablet twice a day  Semaglutide (ozemopic) 0.25mg  inject once weekly for a month then go up to 0.5mg  once weekly there after  She should also be on lantus 40 units once daily.   She has had issues with the insulin and we have switched back and forth. But these are the medications the patient should be on. I am not sure if should be the one to call or not. Looks like Dr. Angelica Chessman did respond back and state she can see her in office next week to discuss things. Let me know please.   Also Selena Batten offered to check labs on the patient but have not heard back yet   Thanks,  [place] "

## 2021-08-17 NOTE — Progress Notes (Signed)
Cardiac Individual Treatment Plan  Patient Details  Name: Holly Garner MRN: [id number] Date of Birth: 06-17-1981 Referring Provider:   Flowsheet Row Cardiac Rehab from 06/19/2021 in [facility] Cardiac and Pulmonary Rehab  Referring Provider Kathlyn Sacramento MD  [facility] Cardiologist: Dr. Aaron Edelman Agbor-Etang]       Initial Encounter Date:  Flowsheet Row Cardiac Rehab from 06/19/2021 in [facility] Cardiac and Pulmonary Rehab  Date 06/19/21       Visit Diagnosis: NSTEMI (non-ST elevated myocardial infarction) [facility])  Status post coronary artery stent placement  Patient's Home Medications on Admission:  Current Outpatient Medications:    ACCU-CHEK GUIDE test strip, USE TO TEST BLOOD SUGARS UP TO FOUR TIMES DAILY, Disp: 100 each, Rfl: 1   Accu-Chek Softclix Lancets lancets, USE TO TEST BLOOD SUGARS UP TO FOUR TIMES DAILY, Disp: 100 each, Rfl: 1   aspirin 81 MG chewable tablet, Chew 1 tablet (81 mg total) by mouth daily., Disp: 30 tablet, Rfl: 0   atorvastatin (LIPITOR) 80 MG tablet, TAKE 1 TABLET BY MOUTH ONCE A DAY, Disp: 90 tablet, Rfl: 1   blood glucose meter kit and supplies KIT, Dispense based on patient and insurance preference. Use up to four times daily as directed., Disp: 1 each, Rfl: 0   Continuous Blood Gluc Sensor (FREESTYLE LIBRE 2 SENSOR) MISC, 1 each by Does not apply route as directed., Disp: 2 each, Rfl: 11   EPINEPHrine (EPIPEN 2-PAK) 0.3 mg/0.3 mL IJ SOAJ injection, Inject 0.3 mLs (0.3 mg total) into the muscle once., Disp: 2 Device, Rfl: 0   ezetimibe (ZETIA) 10 MG tablet, Take 1 tablet (10 mg total) by mouth daily., Disp: 90 tablet, Rfl: 3   insulin glargine (LANTUS) 100 UNIT/ML injection, Inject 0.4 mLs (40 Units total) into the skin daily., Disp: 12 mL, Rfl: 2   Insulin Pen Needle (PEN NEEDLES) 30G X 8 MM MISC, 1 each by Does not apply route daily., Disp: 100 each, Rfl: 3   losartan (COZAAR) 25 MG tablet, Take 1 tablet (25 mg total) by mouth daily., Disp: 90 tablet, Rfl: 3    metFORMIN (GLUCOPHAGE) 1000 MG tablet, Take 1 tablet (1,000 mg total) by mouth 2 (two) times daily with a meal., Disp: 180 tablet, Rfl: 3   metFORMIN (GLUCOPHAGE) 1000 MG tablet, Take 1 tablet (1,000 mg total) by mouth 2 (two) times daily with a meal. For diabetes., Disp: 60 tablet, Rfl: 0   metoprolol succinate (TOPROL-XL) 25 MG 24 hr tablet, Take 1 tablet (25 mg total) by mouth daily., Disp: 90 tablet, Rfl: 3   misoprostol (CYTOTEC) 100 MCG tablet, Take 1 tablet (100 mcg total) by mouth once for 1 dose. 1 hour before appt, Disp: 1 tablet, Rfl: 0   nitroGLYCERIN (NITROSTAT) 0.4 MG SL tablet, Place 1 tablet (0.4 mg total) under the tongue every 5 (five) minutes as needed for chest pain., Disp: 25 tablet, Rfl: 3   predniSONE (DELTASONE) 10 MG tablet, Take 2 tablets (20 mg total) by mouth daily as needed., Disp: 10 tablet, Rfl: 0   PROAIR HFA 108 (90 Base) MCG/ACT inhaler, Inhale 1-2 puffs into the lungs every 6 (six) hours as needed for wheezing or shortness of breath., Disp: 8.5 g, Rfl: 1   Semaglutide,0.25 or 0.5MG/DOS, (OZEMPIC, 0.25 OR 0.5 MG/DOSE,) 2 MG/1.5ML SOPN, Inject 0.25 mg into the skin once a week for 28 days, THEN 0.5 mg once a week for 28 days. Stop farxgia and start ozempic., Disp: 2.25 mL, Rfl: 1   ticagrelor (BRILINTA) 90  MG TABS tablet, Take 1 tablet (90 mg total) by mouth 2 (two) times daily., Disp: 180 tablet, Rfl: 0  Past Medical History: Past Medical History:  Diagnosis Date   BMI 50.0-59.9, adult ([place])    CAD (coronary artery disease)    a. 04/2021 NSTEMI/PCI: LM nl, LAD 92m 30d, D1 nl, LCX 672mOM1 min irregs, RCA 20p, 30p/m, 100d (2.5x26 Resolute Onyx DES).   Essential hypertension    High cholesterol    History of echocardiogram    a. 04/2021 Echo: EF 60-65%. No rwma. Nl RV fxn.   History of Papanicolaou smear of cervix 04/2011   normal per pt.   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   hgsil   Tobacco abuse    Type II diabetes mellitus (HCC)      Tobacco Use: Social History   Tobacco Use  Smoking Status Former   Packs/day: 1.00   Years: 10.00   Pack years: 10.00   Types: Cigarettes   Quit date: 05/03/2021   Years since quitting: 0.2  Smokeless Tobacco Never    Labs: Recent Review Flowsheet Data     Labs for ITP Cardiac and Pulmonary Rehab Latest Ref Rng & Units 07/23/2019 05/03/2021 05/04/2021 06/15/2021 07/31/2021   Cholestrol 100 - 199 mg/dL - - 193 163 -   LDLCALC 0 - 99 mg/dL - - 130(H) 105(H) -   HDL >39 mg/dL - - 26(L) 36(L) -   Trlycerides 0 - 149 mg/dL - - 184(H) 123 -   Hemoglobin A1c 4.0 - 5.6 % 8.5(A) 10.8(H) - - 7.2(A)        Exercise Target Goals: Exercise Program Goal: Individual exercise prescription set using results from initial 6 min walk test and THRR while considering  patient's activity barriers and safety.   Exercise Prescription Goal: Initial exercise prescription builds to 30-45 minutes a day of aerobic activity, 2-3 days per week.  Home exercise guidelines will be given to patient during program as part of exercise prescription that the participant will acknowledge.   Education: Aerobic Exercise: - Group verbal and visual presentation on the components of exercise prescription. Introduces F.I.T.T principle from ACSM for exercise prescriptions.  Reviews F.I.T.T. principles of aerobic exercise including progression. Written material given at graduation. Flow[facility]  Education need identified 06/19/21       Education: Resistance Exercise: - Group verbal and visual presentation on the components of exercise prescription. Introduces F.I.T.T principle from ACSM for exercise prescriptions  Reviews F.I.T.T. principles of resistance exercise including progression. Written material given at graduation.    Education: Exercise & Equipment Safety: - Individual verbal instruction and demonstration of equipment use and safety with  use of the equipment. [facility]  Date 06/19/21  [facility] Code 1- Verbalizes Understanding       Education: Exercise Physiology & General Exercise Guidelines: - Group verbal and written instruction with models to review the exercise physiology of the cardiovascular system and associated critical values. Provides general exercise guidelines with specific guidelines to those with heart or lung disease.    Education: Flexibility, Balance, Mind/Body Relaxation: - Group verbal and visual presentation with interactive activity on the components of exercise prescription. Introduces F.I.T.T principle from ACSM for exercise prescriptions. Reviews F.I.T.T. principles of flexibility and balance exercise training including progression. Also discusses the mind body connection.  Reviews various relaxation techniques  to help reduce and manage stress (i.e. Deep breathing, progressive muscle relaxation, and visualization). Balance handout provided to take home. Written material given at graduation.   Activity Barriers & Risk Stratification:  Activity Barriers & Cardiac Risk Stratification - 06/19/21 0946       Activity Barriers & Cardiac Risk Stratification   Activity Barriers Deconditioning;Muscular Weakness    Cardiac Risk Stratification Moderate             6 Minute Walk:  6 Minute Walk     Row Name 06/19/21 0940         6 Minute Walk   Phase Initial     Distance 1130 feet     Walk Time 6 minutes     # of Rest Breaks 0     MPH 2.14     METS 3.78     RPE 7     VO2 Peak 13.21     Symptoms No     Resting HR 72 bpm     Resting BP 146/76     Resting Oxygen Saturation  98 %     Exercise Oxygen Saturation  during 6 min walk 96 %     Max Ex. HR 131 bpm     Max Ex. BP 156/76     2 Minute Post BP 126/64              Oxygen Initial Assessment:   Oxygen Re-Evaluation:   Oxygen  Discharge (Final Oxygen Re-Evaluation):   Initial Exercise Prescription:  Initial Exercise Prescription - 06/19/21 1000       Date of Initial Exercise RX and Referring Provider   Date 06/19/21    Referring Provider Kathlyn Sacramento MD   Primary Cardiologist: Dr. Kate Sable     Treadmill   MPH 2.5    Grade 1    Minutes 15    METs 3.26      Recumbant Bike   Level 4    RPM 50    Watts 76    Minutes 15    METs 3.5      Elliptical   Level 1    Speed 3    Minutes 15    METs 3      T5 Nustep   Level 3    SPM 80    Minutes 15    METs 3.5      Prescription Details   Frequency (times per week) 3    Duration Progress to 30 minutes of continuous aerobic without signs/symptoms of physical distress      Intensity   THRR 40-80% of Max Heartrate 116-161    Ratings of Perceived Exertion 11-13    Perceived Dyspnea 0-4      Progression   Progression Continue to progress workloads to maintain intensity without signs/symptoms of physical distress.      Resistance Training   Training Prescription Yes    Weight 4 lb    Reps 10-15             Perform Capillary Blood Glucose checks as needed.  Exercise Prescription Changes:   Exercise Prescription Changes     Row Name 06/19/21 1000             Response to Exercise   Blood Pressure (Admit) 146/76       Blood Pressure (Exercise) 156/78       Blood Pressure (Exit) 126/64       Heart Rate (Admit) 72 bpm  Heart Rate (Exercise) 131 bpm       Heart Rate (Exit) 75 bpm       Oxygen Saturation (Admit) 98 %       Oxygen Saturation (Exercise) 96 %       Rating of Perceived Exertion (Exercise) 7       Symptoms none       Comments walk test results                Exercise Comments:   Exercise Goals and Review:   Exercise Goals     Row Name 06/19/21 1017             Exercise Goals   Increase Strength and Stamina Yes       Intervention Provide advice, education, support and counseling about  physical activity/exercise needs.;Develop an individualized exercise prescription for aerobic and resistive training based on initial evaluation findings, risk stratification, comorbidities and participant's personal goals.       Expected Outcomes Short Term: Increase workloads from initial exercise prescription for resistance, speed, and METs.;Short Term: Perform resistance training exercises routinely during rehab and add in resistance training at home;Long Term: Improve cardiorespiratory fitness, muscular endurance and strength as measured by increased METs and functional capacity (6MWT)       Able to understand and use rate of perceived exertion (RPE) scale Yes       Intervention Provide education and explanation on how to use RPE scale       Expected Outcomes Short Term: Able to use RPE daily in rehab to express subjective intensity level;Long Term:  Able to use RPE to guide intensity level when exercising independently       Able to understand and use Dyspnea scale Yes       Intervention Provide education and explanation on how to use Dyspnea scale       Expected Outcomes Long Term: Able to use Dyspnea scale to guide intensity level when exercising independently;Short Term: Able to use Dyspnea scale daily in rehab to express subjective sense of shortness of breath during exertion       Knowledge and understanding of Target Heart Rate Range (THRR) Yes       Intervention Provide education and explanation of THRR including how the numbers were predicted and where they are located for reference       Expected Outcomes Short Term: Able to state/look up THRR;Short Term: Able to use daily as guideline for intensity in rehab;Long Term: Able to use THRR to govern intensity when exercising independently       Able to check pulse independently Yes       Intervention Provide education and demonstration on how to check pulse in carotid and radial arteries.;Review the importance of being able to check your own  pulse for safety during independent exercise       Expected Outcomes Short Term: Able to explain why pulse checking is important during independent exercise;Long Term: Able to check pulse independently and accurately       Understanding of Exercise Prescription Yes       Intervention Provide education, explanation, and written materials on patient's individual exercise prescription       Expected Outcomes Short Term: Able to explain program exercise prescription;Long Term: Able to explain home exercise prescription to exercise independently                Exercise Goals Re-Evaluation :  Exercise Goals Re-Evaluation     Row Name 08/07/21  1054             Exercise Goal Re-Evaluation   Comments Has yet to start rehab.                Discharge Exercise Prescription (Final Exercise Prescription Changes):  Exercise Prescription Changes - 06/19/21 1000       Response to Exercise   Blood Pressure (Admit) 146/76    Blood Pressure (Exercise) 156/78    Blood Pressure (Exit) 126/64    Heart Rate (Admit) 72 bpm    Heart Rate (Exercise) 131 bpm    Heart Rate (Exit) 75 bpm    Oxygen Saturation (Admit) 98 %    Oxygen Saturation (Exercise) 96 %    Rating of Perceived Exertion (Exercise) 7    Symptoms none    Comments walk test results             Nutrition:  Target Goals: Understanding of nutrition guidelines, daily intake of sodium <152m, cholesterol <2052m calories 30% from fat and 7% or less from saturated fats, daily to have 5 or more servings of fruits and vegetables.  Education: All About Nutrition: -Group instruction provided by verbal, written material, interactive activities, discussions, models, and posters to present general guidelines for heart healthy nutrition including fat, fiber, MyPlate, the role of sodium in heart healthy nutrition, utilization of the nutrition label, and utilization of this knowledge for meal planning. Follow up email sent as well.  Written material given at graduation. Flowsheet [facility]  Education need identified 06/19/21       Biometrics:  Pre Biometrics - 06/19/21 1017       Pre Biometrics   Height _0  (1.676 m)    Weight 297 lb 6.4 oz (134.9 kg)    BMI (Calculated) 48.02    Single Leg Stand 30 seconds              Nutrition Therapy Plan and Nutrition Goals:  Nutrition Therapy & Goals - 06/19/21 1019       Intervention Plan   Intervention Prescribe, educate and counsel regarding individualized specific dietary modifications aiming towards targeted core components such as weight, hypertension, lipid management, diabetes, heart failure and other comorbidities.    Expected Outcomes Short Term Goal: Understand basic principles of dietary content, such as calories, fat, sodium, cholesterol and nutrients.;Long Term Goal: Adherence to prescribed nutrition plan.;Short Term Goal: A plan has been developed with personal nutrition goals set during dietitian appointment.             Nutrition Assessments:  MEDIFICTS Score Key: ?70 Need to make dietary changes  40-70 Heart Healthy Diet ? 40 Therapeutic Level Cholesterol Diet  Flow[facility]  Picture Your Plate Total Score on Admission 75      Picture Your Plate Scores: <4<62nhealthy dietary pattern with much room for improvement. 41-50 Dietary pattern unlikely to meet recommendations for good health and room for improvement. 51-60 More healthful dietary pattern, with some room for improvement.  >60 Healthy dietary pattern, although there may be some specific behaviors that could be improved.    Nutrition Goals Re-Evaluation:   Nutrition Goals Discharge (Final Nutrition Goals Re-Evaluation):   Psychosocial: Target Goals: Acknowledge presence or absence of significant depression and/or stress, maximize coping skills,  provide positive support system. Participant is able to verbalize types and ability to use techniques and skills needed for reducing  stress and depression.   Education: Stress, Anxiety, and Depression - Group verbal and visual presentation to define topics covered.  Reviews how body is impacted by stress, anxiety, and depression.  Also discusses healthy ways to reduce stress and to treat/manage anxiety and depression.  Written material given at graduation.   Education: Sleep Hygiene -Provides group verbal and written instruction about how sleep can affect your health.  Define sleep hygiene, discuss sleep cycles and impact of sleep habits. Review good sleep hygiene tips.    Initial Review & Psychosocial Screening:  Initial Psych Review & Screening - 05/26/21 1410       Initial Review   Current issues with Current Sleep Concerns;Current Stress Concerns    Source of Stress Concerns Chronic Illness      Family Dynamics   Good Support System? Yes   husband, kids, parents     Barriers   Psychosocial barriers to participate in program There are no identifiable barriers or psychosocial needs.;The patient should benefit from training in stress management and relaxation.      Screening Interventions   Interventions Encouraged to exercise;Provide feedback about the scores to participant;To provide support and resources with identified psychosocial needs    Expected Outcomes Short Term goal: Utilizing psychosocial counselor, staff and physician to assist with identification of specific Stressors or current issues interfering with healing process. Setting desired goal for each stressor or current issue identified.;Long Term Goal: Stressors or current issues are controlled or eliminated.;Short Term goal: Identification and review with participant of any Quality of Life or Depression concerns found by scoring the questionnaire.;Long Term goal: The participant improves quality of Life and PHQ9 Scores as  seen by post scores and/or verbalization of changes             Quality of Life Scores:   Quality of Life - 06/19/21 1018       Quality of Life   Select Quality of Life      Quality of Life Scores   Health/Function Pre 29.6 %    Socioeconomic Pre 30 %    Psych/Spiritual Pre 30 %    Family Pre 30 %    GLOBAL Pre 29.82 %            Scores of 19 and below usually indicate a poorer quality of life in these areas.  A difference of  2-3 points is a clinically meaningful difference.  A difference of 2-3 points in the total score of the Quality of Life Index has been associated with significant improvement in overall quality of life, self-image, physical symptoms, and general health in studies assessing change in quality of life.  PHQ-9: Recent Review Flowsheet Data     Depression screen [facility] 2/9 06/19/2021 05/24/2021 03/24/2019   Decreased Interest 0 0 0   Down, Depressed, Hopeless 0 0 0   PHQ - 2 Score 0 0 0   Altered sleeping 2 - -   Tired, decreased energy 1 - -   Change in appetite 0 - -   Feeling bad or failure about yourself  0 - -   Trouble concentrating 0 - -   Moving slowly or fidgety/restless 0 - -   Suicidal thoughts 0 - -   PHQ-9 Score 3 - -   Difficult doing work/chores Not difficult at all - -      Interpretation of Total Score  Total Score Depression Severity:  1-4 = Minimal depression, 5-9 = Mild depression, 10-14 = Moderate depression,  15-19 = Moderately severe depression, 20-27 = Severe depression   Psychosocial Evaluation and Intervention:  Psychosocial Evaluation - 05/26/21 1423       Psychosocial Evaluation & Interventions   Interventions Stress management education;Encouraged to exercise with the program and follow exercise prescription    Comments Emonie is coming to cardiac rehab bost MI and stents. While she was in the hospital, she was diagnosed with diabetes. She is seeing a diabetes educator to learn more about living with diabetes. She also  quit smoking when she was admitted and so did her husband. Together, they both feel encouraged to keep each other accountable. She thinks her MI was exacerbated by her son's friend's car accident that killed 3 people. She found out later that her son was supposed to be in the car, so although she is very thankful he was not it is stressful to think about and going through the grieving process for those teens. She states her husband, parents, and children are her main support system and they have been very present during her recovery. 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 She is excited about starting cardiac rehab to learn more about heart healthy living, including managing her diabetes and risk factors.    Expected Outcomes Short: attend cardiac rehab for education and exercise. Long: develop and maintain positive self care habits.    Continue Psychosocial Services  Follow up required by staff             Psychosocial Re-Evaluation:   Psychosocial Discharge (Final Psychosocial Re-Evaluation):   Vocational Rehabilitation: Provide vocational rehab assistance to qualifying candidates.   Vocational Rehab Evaluation & Intervention:  Vocational Rehab - 05/26/21 1413       Initial Vocational Rehab Evaluation & Intervention   Assessment shows need for Vocational Rehabilitation No             Education: Education Goals: Education classes will be provided on a variety of topics geared toward better understanding of heart health and risk factor modification. Participant will state understanding/return demonstration of topics presented as noted by education test scores.  Learning Barriers/Preferences:  Learning Barriers/Preferences - 05/26/21 1413       Learning Barriers/Preferences   Learning Barriers None    Learning Preferences None             General Cardiac Education Topics:  AED/CPR: - Group  verbal and written instruction with the use of models to demonstrate the basic use of the AED with the basic ABC's of resuscitation.   Anatomy and Cardiac Procedures: - Group verbal and visual presentation and models provide information about basic cardiac anatomy and function. Reviews the testing methods done to diagnose heart disease and the outcomes of the test results. Describes the treatment choices: Medical Management, Angioplasty, or Coronary Bypass Surgery for treating various heart conditions including Myocardial Infarction, Angina, Valve Disease, and Cardiac Arrhythmias.  Written material given at graduation.   Medication Safety: - Group verbal and visual instruction to review commonly prescribed medications for heart and lung disease. Reviews the medication, class of the drug, and side effects. Includes the steps to properly store meds and maintain the prescription regimen.  Written material given at graduation.   Intimacy: - Group verbal instruction through game format to discuss how heart and lung disease can affect sexual intimacy. Written material given at graduation..   Know Your Numbers and Heart Failure: -  Group verbal and visual instruction to discuss disease risk factors for cardiac and pulmonary disease and treatment options.  Reviews associated critical values for Overweight/Obesity, Hypertension, Cholesterol, and Diabetes.  Discusses basics of heart failure: signs/symptoms and treatments.  Introduces Heart Failure Zone chart for action plan for heart failure.  Written material given at graduation. Flowsheet Row Cardiac Rehab from 06/19/2021 in [facility] Cardiac and Pulmonary Rehab  Education need identified 06/19/21       Infection Prevention: - Provides verbal and written material to individual with discussion of infection control including proper hand washing and proper equipment cleaning during exercise session. Flowsheet Row Cardiac Rehab from 06/19/2021 in [facility] Cardiac  and Pulmonary Rehab  Date 06/19/21  Educator [facility]  Instruction Review Code 1- Verbalizes Understanding       Falls Prevention: - Provides verbal and written material to individual with discussion of falls prevention and safety. Flowsheet Row Cardiac Rehab [facility]19[facility] Cardiac and Pulmonary Rehab  Date 06/19/21  Educator [facility]  Instruction Review Code 1- Verbalizes Understanding       Other: -Provides group and verbal instruction on various topics (see comments)   Knowledge Questionnaire Score:  Knowledge Questionnaire Score - 06/19/21 1019       Knowledge Questionnaire Score   Pre Score 22/26 Education: Angina, exericse, nutrition, PAD             Core Components/Risk Factors/Patient Goals at Admission:  Personal Goals and Risk Factors at Admission - 06/19/21 1022       Core Components/Risk Factors/Patient Goals on Admission    Weight Management Yes;Weight Loss;Obesity    Intervention Weight Management: Develop a combined nutrition and exercise program designed to reach desired caloric intake, while maintaining appropriate intake of nutrient and fiber, sodium and fats, and appropriate energy expenditure required for the weight goal.;Weight Management: Provide education and appropriate resources to help participant work on and attain dietary goals.;Weight Management/Obesity: Establish reasonable short term and long term weight goals.;Obesity: Provide education and appropriate resources to help participant work on and attain dietary goals.    Admit Weight 297 lb 6.4 oz (134.9 kg)    Goal Weight: Short Term 290 lb (131.5 kg)    Goal Weight: Long Term 280 lb (127 kg)    Expected Outcomes Short Term: Continue to assess and modify interventions until short term weight is achieved;Long Term: Adherence to nutrition and physical activity/exercise program aimed toward attainment of established weight goal;Weight Loss: Understanding of general recommendations for a balanced  deficit meal plan, which promotes 1-2 lb weight loss per week and includes a negative energy balance of [phone] kcal/d;Understanding recommendations for meals to include 15-35% energy as protein, 25-35% energy from fat, 35-60% energy from carbohydrates, less than 259m of dietary cholesterol, 20-35 gm of total fiber daily;Understanding of distribution of calorie intake throughout the day with the consumption of 4-5 meals/snacks    Tobacco Cessation Yes   quit 05/03/21   Expected Outcomes Short Term: Will quit all tobacco product use, adhering to prevention of relapse plan.;Long Term: Complete abstinence from all tobacco products for at least 12 months from quit date.    Diabetes Yes    Intervention Provide education about signs/symptoms and action to take for hypo/hyperglycemia.;Provide education about proper nutrition, including hydration, and aerobic/resistive exercise prescription along with prescribed medications to achieve blood glucose in normal ranges: Fasting glucose 65-99 mg/dL    Expected Outcomes Short Term: Participant verbalizes understanding of the signs/symptoms and immediate care of hyper/hypoglycemia, proper foot care  and importance of medication, aerobic/resistive exercise and nutrition plan for blood glucose control.;Long Term: Attainment of HbA1C < 7%.    Hypertension Yes    Intervention Provide education on lifestyle modifcations including regular physical activity/exercise, weight management, moderate sodium restriction and increased consumption of fresh fruit, vegetables, and low fat dairy, alcohol moderation, and smoking cessation.;Monitor prescription use compliance.    Expected Outcomes Short Term: Continued assessment and intervention until BP is < 140/75m HG in hypertensive participants. < 130/813mHG in hypertensive participants with diabetes, heart failure or chronic kidney disease.;Long Term: Maintenance of blood pressure at goal levels.    Lipids Yes    Intervention Provide  education and support for participant on nutrition & aerobic/resistive exercise along with prescribed medications to achieve LDL <7028mHDL >80m70m  Expected Outcomes Short Term: Participant states understanding of desired cholesterol values and is compliant with medications prescribed. Participant is following exercise prescription and nutrition guidelines.;Long Term: Cholesterol controlled with medications as prescribed, with individualized exercise RX and with personalized nutrition plan. Value goals: LDL < 70mg35mL > 40 mg.             Education:Diabetes - Individual verbal and written instruction to review signs/symptoms of diabetes, desired ranges of glucose level fasting, after meals and with exercise. Acknowledge that pre and post exercise glucose checks will be done for 3 sessions at entry of program. [facility]  Date 05/26/21  Educator [facility] Code 1- Verbalizes Understanding       Core Components/Risk Factors/Patient Goals Review:   Goals and Risk Factor Review     Row Name 06/19/21 1037             Core Components/Risk Factors/Patient Goals Review   Personal Goals Review Tobacco Cessation       Review She has recently quit tobacco use within the last 6 months. Intervention for relapse prevention was provided at the initial medical review. Patient was encouraged to continue to with tobacco cessation and was provided information on relapse prevention. Patient received information about combination therapy, tobacco cessation classes, quit line, and quit smoking apps in case of a relapse. Patient demonstrated understanding of this material.Staff will continue to provide encouragement and follow up with the patient throughout the program.       Expected Outcomes Continued cessation                Core Components/Risk Factors/Patient Goals at Discharge (Final Review):   Goals and Risk  Factor Review - 06/19/21 1037       Core Components/Risk Factors/Patient Goals Review   Personal Goals Review Tobacco Cessation    Review She has recently quit tobacco use within the last 6 months. Intervention for relapse prevention was provided at the initial medical review. Patient was encouraged to continue to with tobacco cessation and was provided information on relapse prevention. Patient received information about combination therapy, tobacco cessation classes, quit line, and quit smoking apps in case of a relapse. Patient demonstrated understanding of this material.Staff will continue to provide encouragement and follow up with the patient throughout the program.    Expected Outcomes Continued cessation             ITP Comments:  ITP Comments     Row Name 05/26/21 1429 06/19/21 0939 06/28/21 0730 07/10/21 1033 07/11/21 1511   ITP Comments Initial telephone orientation completed. Diagnosis can be found in [facility]/22. EP  orientation scheduled for Thursday 8/11 at 8am. Completed 6MWT and gym orientation. Initial ITP created and sent for review to Dr. Emily Filbert, Medical Director. 30 Day review completed. Medical Director ITP review done, changes made as directed, and signed approval by Medical Director. Paije has not shown up since her orientation on 8/29. Will follow up with patient to make contact to follow up. Pt was supposed to start rehab on 9/12.  She has not called nor shown up for any appointments.  Called to check in.  Left message.    [place] Name 07/18/21 1110 07/26/21 1223 08/02/21 1020 08/07/21 1053 08/17/21 1518   ITP Comments Spoke with patient. She has not been able to come to rehab due to her work schedule and will be getting a new schedule next week. She will call us next week to get a new time scheduled. Start date TBD. 30 day review completed. ITP sent to Dr. Emily Filbert, Medical Director of Cardiac Rehab. Continue with ITP unless changes are made by physician. Pt has  yet to start rehab due to new work schedule. No response from patient. Has not attended since 8/29 for orientation. Will send letter. Pt has been called multiple times.  Last heard, she had started a new job but has not reached back out.  Sent Letter to d/c on 10/21 No response from patient after multiple attempts. Will discharge at this time.            Comments: Discharge ITP

## 2021-08-17 NOTE — Telephone Encounter (Signed)
Please see phone encounter in chart documenting the conversation that I had with patient today.   I did make patient an appointment with Dr. Selena Batten for Tuesday.  For med follow up of concerns and TOC.  However, I did not see this message to mention to her of Dr. Elmyra Ricks maternity leave.  I sent her a my chart message to clarify that this is still okay with her.   The appointment is in a slot at 9am 11/1 am.  If not able to do a TOC at that time please let me know and I will work on getting patient in at another time that week for a slot.    Thanks.

## 2021-08-17 NOTE — Telephone Encounter (Signed)
Appreciate the follow-up call. Will see patient next week

## 2021-08-17 NOTE — Progress Notes (Signed)
Discharge Progress Report  Patient Details  Name: Holly Garner MRN: [id number] Date of Birth: 07-04-1981 Referring Provider:   Flowsheet Row Cardiac Rehab from 06/19/2021 in [facility]  Referring Provider Lorine Bears MD  [facility] Cardiologist: Dr. Arlys John Agbor-Etang]        Number of Visits: 1  Reason for Discharge:  Early Exit:  Lack of attendance  Smoking History:  Social History   Tobacco Use  Smoking Status Former   Packs/day: 1.00   Years: 10.00   Pack years: 10.00   Types: Cigarettes   Quit date: 05/03/2021   Years since quitting: 0.2  Smokeless Tobacco Never    Diagnosis:  NSTEMI (non-ST elevated myocardial infarction) (HCC)  Status post coronary artery stent placement  ADL UCSD:   Initial Exercise Prescription:  Initial Exercise Prescription - 06/19/21 1000       Date of Initial Exercise RX and Referring Provider   Date 06/19/21    Referring Provider Lorine Bears MD   Primary Cardiologist: Dr. Debbe Odea     Treadmill   MPH 2.5    Grade 1    Minutes 15    METs 3.26      Recumbant Bike   Level 4    RPM 50    Watts 76    Minutes 15    METs 3.5      Elliptical   Level 1    Speed 3    Minutes 15    METs 3      T5 Nustep   Level 3    SPM 80    Minutes 15    METs 3.5      Prescription Details   Frequency (times per week) 3    Duration Progress to 30 minutes of continuous aerobic without signs/symptoms of physical distress      Intensity   THRR 40-80% of Max Heartrate 116-161    Ratings of Perceived Exertion 11-13    Perceived Dyspnea 0-4      Progression   Progression Continue to progress workloads to maintain intensity without signs/symptoms of physical distress.      Resistance Training   Training Prescription Yes    Weight 4 lb    Reps 10-15             Discharge Exercise Prescription (Final Exercise Prescription Changes):  Exercise Prescription Changes - 06/19/21 1000        Response to Exercise   Blood Pressure (Admit) 146/76    Blood Pressure (Exercise) 156/78    Blood Pressure (Exit) 126/64    Heart Rate (Admit) 72 bpm    Heart Rate (Exercise) 131 bpm    Heart Rate (Exit) 75 bpm    Oxygen Saturation (Admit) 98 %    Oxygen Saturation (Exercise) 96 %    Rating of Perceived Exertion (Exercise) 7    Symptoms none    Comments walk test results             Functional Capacity:  6 Minute Walk     Row Name 06/19/21 0940         6 Minute Walk   Phase Initial     Distance 1130 feet     Walk Time 6 minutes     # of Rest Breaks 0     MPH 2.14     METS 3.78     RPE 7     VO2 Peak 13.21     Symptoms No  Resting HR 72 bpm     Resting BP 146/76     Resting Oxygen Saturation  98 %     Exercise Oxygen Saturation  during 6 min walk 96 %     Max Ex. HR 131 bpm     Max Ex. BP 156/76     2 Minute Post BP 126/64               Nutrition & Weight - Outcomes:  Pre Biometrics - 06/19/21 1017       Pre Biometrics   Height 5\' 6"  (1.676 m)    Weight 297 lb 6.4 oz (134.9 kg)    BMI (Calculated) 48.02    Single Leg Stand 30 seconds             Nutrition:  Nutrition Therapy & Goals - 06/19/21 1019       Intervention Plan   Intervention Prescribe, educate and counsel regarding individualized specific dietary modifications aiming towards targeted core components such as weight, hypertension, lipid management, diabetes, heart failure and other comorbidities.    Expected Outcomes Short Term Goal: Understand basic principles of dietary content, such as calories, fat, sodium, cholesterol and nutrients.;Long Term Goal: Adherence to prescribed nutrition plan.;Short Term Goal: A plan has been developed with personal nutrition goals set during dietitian appointment.              Goals reviewed with patient; copy given to patient.

## 2021-08-17 NOTE — Telephone Encounter (Addendum)
I spoke with patient regarding her concerns.  Mainly, she is just scared and upset because she has been taking such a high amount of metformin for nearly 2 months.   She states that it was not made clear to her that the mg dosage had changed from what Dr. Selena Batten started her on in July (500mg  tablets) to the (1000mg ) tablets when [facility] had refilled it in September.   Although she knew she was supposed to be taking 1000mg  bid, she did not realize that the mg strength had changed on the bottle when picking it up from the pharmacy.   She was very anxious and scared but remained calm with me on the phone.    Patient wanted to know why [facility], NP had called her instead of Matt Cable,NP regarding the mixup.   I extended to her that October definitely would have but is out of the office dealing with a family emergency.  He did reach out to me multiple times to ensure you were contacted and is concerned and apologizes for the miscommunication as it was his understanding that this had been communicated to her.  He requests that we ensure that our medication list matches what you are taking and what is on your bottles.    I did review her medications with her and confirmed everything on her list is correct EXCEPT - she was unsure if she is on Zetia but will confirm that next week at her appointment.  I also confirmed that [facility] pharmacy does have the 30 day supply of Metformin 1000mg  1 po bid for patient and she verbalizes understanding of how to correctly take.   I did make her a TOC appt to review concerns with Dr. Mayra Reel and possibly set up for lab work to review her kidney function for 08/22/21 at 9am.  She is aware to see [facility] at [facility] location and to bring her whole stock of medications so that the assistant and Dr. Selena Batten can review with her at the time of appointment.   She thanks me for the call and knows that she can call me back if any further questions or concerns.

## 2021-08-17 NOTE — Telephone Encounter (Signed)
Will defer to manager to call patient.   On my review of the chart it appears the error with metformin was a communication one and not a prescription error when the dose was increased to 1000 mg from 500 mg.   I can take her on a patient but if she wants to make changes to her current diabetes regimen would recommend a follow-up visit with me next week to discuss. Please also make sure she is aware that I will be out for 3 months as this may make her want to consider going with another provider.

## 2021-08-18 NOTE — Telephone Encounter (Signed)
20 minutes is fine

## 2021-08-21 ENCOUNTER — Encounter: Payer: Self-pay | Admitting: Obstetrics and Gynecology

## 2021-08-22 ENCOUNTER — Telehealth: Payer: Self-pay

## 2021-08-22 ENCOUNTER — Other Ambulatory Visit: Payer: Self-pay

## 2021-08-22 ENCOUNTER — Encounter: Payer: Self-pay | Admitting: Obstetrics and Gynecology

## 2021-08-22 ENCOUNTER — Encounter: Payer: Self-pay | Admitting: Family Medicine

## 2021-08-22 ENCOUNTER — Ambulatory Visit: Payer: Medicaid Other | Admitting: Family Medicine

## 2021-08-22 ENCOUNTER — Ambulatory Visit: Payer: Medicaid Other | Admitting: Obstetrics and Gynecology

## 2021-08-22 VITALS — BP 130/70 | Ht 65.0 in | Wt 304.0 lb

## 2021-08-22 VITALS — BP 116/80 | HR 90 | Temp 96.6°F | Ht 65.0 in | Wt 302.0 lb

## 2021-08-22 DIAGNOSIS — Z30014 Encounter for initial prescription of intrauterine contraceptive device: Secondary | ICD-10-CM

## 2021-08-22 DIAGNOSIS — E7800 Pure hypercholesterolemia, unspecified: Secondary | ICD-10-CM

## 2021-08-22 DIAGNOSIS — E1169 Type 2 diabetes mellitus with other specified complication: Secondary | ICD-10-CM

## 2021-08-22 DIAGNOSIS — Z30430 Encounter for insertion of intrauterine contraceptive device: Secondary | ICD-10-CM

## 2021-08-22 DIAGNOSIS — I252 Old myocardial infarction: Secondary | ICD-10-CM

## 2021-08-22 DIAGNOSIS — H6690 Otitis media, unspecified, unspecified ear: Secondary | ICD-10-CM | POA: Diagnosis not present

## 2021-08-22 LAB — COMPREHENSIVE METABOLIC PANEL
ALT: 14 U/L (ref 0–35)
AST: 13 U/L (ref 0–37)
Albumin: 3.7 g/dL (ref 3.5–5.2)
Alkaline Phosphatase: 113 U/L (ref 39–117)
BUN: 17 mg/dL (ref 6–23)
CO2: 28 mEq/L (ref 19–32)
Calcium: 8.7 mg/dL (ref 8.4–10.5)
Chloride: 103 mEq/L (ref 96–112)
Creatinine, Ser: 0.63 mg/dL (ref 0.40–1.20)
GFR: 112.87 mL/min (ref >60.00)
Glucose, Bld: 115 mg/dL — ABNORMAL HIGH (ref 70–99)
Potassium: 4.3 mEq/L (ref 3.5–5.1)
Sodium: 139 mEq/L (ref 135–145)
Total Bilirubin: 0.4 mg/dL (ref 0.2–1.2)
Total Protein: 6.3 g/dL (ref 6.0–8.3)

## 2021-08-22 LAB — LIPID PANEL
Cholesterol: 118 mg/dL (ref 0–200)
HDL: 35.6 mg/dL — ABNORMAL LOW (ref >39.00)
LDL Cholesterol: 65 mg/dL (ref 0–99)
NonHDL: 82.59
Total CHOL/HDL Ratio: 3
Triglycerides: 89 mg/dL (ref 0.0–149.0)
VLDL: 17.8 mg/dL (ref 0.0–40.0)

## 2021-08-22 MED ORDER — AMOXICILLIN-POT CLAVULANATE 875-125 MG PO TABS: 1.0000 | ORAL_TABLET | Freq: Two times a day (BID) | ORAL | 0 refills | Status: AC

## 2021-08-22 MED ORDER — SEMAGLUTIDE (1 MG/DOSE) 4 MG/3ML ~~LOC~~ SOPN: 1.0000 mg | PEN_INJECTOR | SUBCUTANEOUS | 3 refills | Status: DC

## 2021-08-22 MED ORDER — MISOPROSTOL 100 MCG PO TABS: 100.0000 ug | ORAL_TABLET | Freq: Once | ORAL | 0 refills | Status: DC

## 2021-08-22 NOTE — Telephone Encounter (Signed)
Noted. Mirena reserved for this patient. 

## 2021-08-22 NOTE — Progress Notes (Signed)
  IUD PROCEDURE NOTE:  Holly Garner is a 40 y.o. [phone] here for IUD insertion. No GYN concerns.  Last pap smear 2021. Pregnancy excluded: patient on period today  IUD Insertion Procedure Note Patient identified, informed consent performed, consent signed.   Discussed risks of irregular bleeding, cramping, infection, malpositioning or misplacement of the IUD outside the uterus which may require further procedure such as laparoscopy, risk of failure <1%. Time out was performed.    A bimanual exam showed the uterus to be anteverted.  Speculum placed in the vagina.  Cervix visualized.  Cleaned with Betadine x 2.  Grasped anteriorly with a single tooth tenaculum.  Uterus sounded to 8 cm.   Mirena IUD placed per manufacturer's recommendations.  Strings trimmed to 3 cm. Tenaculum was removed, good hemostasis noted.  Patient tolerated procedure well.   Patient was given post-procedure instructions.  She was advised to have backup contraception for one week.  Patient was also asked to check IUD strings periodically and follow up in 4 weeks for IUD check.  Adelene Idler MD, [facility], [facility] 08/22/2021 4:26 PM

## 2021-08-22 NOTE — Patient Instructions (Addendum)
Controlling diabetes is important for improving your overall health.   Lab Results  Component Value Date   HGBA1C 7.2 (A) 07/31/2021    The ideal Hemoglobin A1c is <7%    How can you treat your diabetes 1) Intensive Lifestyle Program - [facility]  2) Regular exercise - 30 minutes of moderate activity, 5 times a week 3) Weight loss - only 7% 4) Modifying your diet - limit food high in sugar - go to Diabetes.com to learn more 5) Medication 6) Consider Bariatric Surgery if candidate   Every Year you should have the following:  1) Foot exam 2) Special diabetes eye exam - send [place] the result when you get this done 3) Urine test to look for signs of kidney disease    If you have kidney disease related to diabetes this is how we can treat it 1) Control your diabetes - first with metformin if tolerated 2) If you have high blood pressure - Take a medication that is an ACE inhibitor or ARB (like lisinopril or losartan)  #Diabetes - Continue to check sugars - If CBG <140 fasting consistently for 1 week, then decrease Lantus by 2 units - can adjust weekly as sugars are well controlled

## 2021-08-22 NOTE — Telephone Encounter (Signed)
Patient is scheduled for today at 3:30 with CRS

## 2021-08-22 NOTE — Telephone Encounter (Signed)
Patient is scheduled for mirena placement with CRS 08/22/20 at 3:30

## 2021-08-22 NOTE — Telephone Encounter (Signed)
Pt aware.

## 2021-08-22 NOTE — Assessment & Plan Note (Signed)
C/b HTN, HLD. Improved control. Cont metformin 1000 mg bid (check labs due to 2000 mg bid dosing x 1 month). Cont ozempic with plan to increase from 0.5>1 mg next month. Discussed plan to decrease Lantus by 2 units for every week with fasting CBG <140. Cont weight loss, diabetic diet, and exercise. Encouraged getting eye exam

## 2021-08-22 NOTE — Telephone Encounter (Signed)
Pls call pt and tell her I sent in the cytotec for her this AM that she needs to take 1 hr before her IUD appt today so needs to go ahead and pick it up. I told pharm to fill ASAP.

## 2021-08-22 NOTE — Assessment & Plan Note (Signed)
Discussed indication for losartan 25mg , metoprolol 25 mg, and asa 81 mg and brilinta 90 mg bid as hx of NSTEMI with stents. She anticipates 1 year of dual-antiplatelet. Appreciate cardiology support.

## 2021-08-22 NOTE — Progress Notes (Signed)
Subjective:     Holly Garner is a 40 y.o. female presenting for Medication Problem and Transitions Of Care     HPI  #Diabetes Currently taking metformin, insulin, ozempic  Using medications without difficulties: Yes Hypoglycemic episodes:No  Hyperglycemic episodes:No  Feet problems:No  Blood Sugars averaging: 120-160 Last HgbA1c:  Lab Results  Component Value Date   HGBA1C 7.2 (A) 07/31/2021    Diabetes Health Maintenance Due:    Diabetes Health Maintenance Due  Topic Date Due  . OPHTHALMOLOGY EXAM  08/05/2020  . HEMOGLOBIN A1C  01/29/2022  . FOOT EXAM  08/22/2022    Concerned about taking too much metformin  Lantus 40 units daily Ozempic 0.5 mg starting next week   Review of Systems  07/31/2021: Clinic - DM - Stop Farxiga and start Ozempic. Change from lantus to levemir 40 units, metformin 1000 mg bid. Has lost 50 lbs. Swelling intermittent - using prednisone prn.  Social History   Tobacco Use  Smoking Status Former  . Packs/day: 1.00  . Years: 10.00  . Pack years: 10.00  . Types: Cigarettes  . Quit date: 05/03/2021  . Years since quitting: 0.3  Smokeless Tobacco Never        Objective:    BP Readings from Last 3 Encounters:  08/22/21 130/70  08/22/21 116/80  07/31/21 118/72   Wt Readings from Last 3 Encounters:  08/22/21 (!) 304 lb (137.9 kg)  08/22/21 (!) 302 lb (137 kg)  07/31/21 293 lb 6 oz (133.1 kg)    BP 116/80   Pulse 90   Temp (!) 96.6 F (35.9 C)   Ht 5\' 5"  (1.651 m)   Wt (!) 302 lb (137 kg)   LMP 08/22/2021   SpO2 98%   BMI 50.26 kg/m    Physical Exam Constitutional:      General: She is not in acute distress.    Appearance: She is well-developed. She is not diaphoretic.  HENT:     Right Ear: External ear normal. Drainage present. A middle ear effusion is present.     Left Ear: Tympanic membrane, ear canal and external ear normal.     Nose: Nose normal.  Eyes:     Conjunctiva/sclera: Conjunctivae normal.   Cardiovascular:     Rate and Rhythm: Normal rate and regular rhythm.     Heart sounds: No murmur heard. Pulmonary:     Effort: Pulmonary effort is normal. No respiratory distress.     Breath sounds: Normal breath sounds. No wheezing.  Musculoskeletal:     Cervical back: Neck supple.  Skin:    General: Skin is warm and dry.     Capillary Refill: Capillary refill takes less than 2 seconds.  Neurological:     Mental Status: She is alert. Mental status is at baseline.  Psychiatric:        Mood and Affect: Mood normal.        Behavior: Behavior normal.     Diabetic Foot Exam - Simple   Simple Foot Form Diabetic Foot exam was performed with the following findings: Yes 08/22/2021  8:56 AM  Visual Inspection No deformities, no ulcerations, no other skin breakdown bilaterally: Yes Sensation Testing Intact to touch and monofilament testing bilaterally: Yes Pulse Check Posterior Tibialis and Dorsalis pulse intact bilaterally: Yes Comments         Assessment & Plan:   Problem List Items Addressed This Visit       Endocrine   Type 2 diabetes mellitus with  other specified complication (HCC) - Primary    C/b HTN, HLD. Improved control. Cont metformin 1000 mg bid (check labs due to 2000 mg bid dosing x 1 month). Cont ozempic with plan to increase from 0.5>1 mg next month. Discussed plan to decrease Lantus by 2 units for every week with fasting CBG <140. Cont weight loss, diabetic diet, and exercise. Encouraged getting eye exam      Relevant Medications   Semaglutide, 1 MG/DOSE, 4 MG/3ML SOPN (Start on 09/19/2021)   Other Relevant Orders   Comprehensive metabolic panel (Completed)     Other   Hx of non-ST elevation myocardial infarction (NSTEMI)    Discussed indication for losartan 25mg , metoprolol 25 mg, and asa 81 mg and brilinta 90 mg bid as hx of NSTEMI with stents. She anticipates 1 year of dual-antiplatelet. Appreciate cardiology support.       Pure hypercholesterolemia    Relevant Orders   Lipid panel (Completed)   Other Visit Diagnoses     Acute otitis media, unspecified otitis media type       Relevant Medications   amoxicillin-clavulanate (AUGMENTIN) 875-125 MG tablet      She has drainage from the right ear drum - she notes intolerance to ear drops, course of augmentin for otitis media  Return in about 14 weeks (around 11/28/2021).  01/26/2022, MD  This visit occurred during the SARS-CoV-2 public health emergency.  Safety protocols were in place, including screening questions prior to the visit, additional usage of staff PPE, and extensive cleaning of exam room while observing appropriate contact time as indicated for disinfecting solutions.

## 2021-08-27 ENCOUNTER — Encounter: Payer: Self-pay | Admitting: Family Medicine

## 2021-08-28 ENCOUNTER — Ambulatory Visit: Payer: Medicaid Other | Admitting: Family Medicine

## 2021-08-28 ENCOUNTER — Other Ambulatory Visit: Payer: Self-pay

## 2021-08-28 ENCOUNTER — Other Ambulatory Visit: Payer: Self-pay | Admitting: Family Medicine

## 2021-08-28 VITALS — BP 130/80 | HR 87 | Temp 96.2°F | Ht 65.0 in | Wt 298.0 lb

## 2021-08-28 DIAGNOSIS — L509 Urticaria, unspecified: Secondary | ICD-10-CM | POA: Insufficient documentation

## 2021-08-28 DIAGNOSIS — I252 Old myocardial infarction: Secondary | ICD-10-CM | POA: Diagnosis not present

## 2021-08-28 DIAGNOSIS — E7800 Pure hypercholesterolemia, unspecified: Secondary | ICD-10-CM

## 2021-08-28 DIAGNOSIS — R609 Edema, unspecified: Secondary | ICD-10-CM

## 2021-08-28 DIAGNOSIS — E1169 Type 2 diabetes mellitus with other specified complication: Secondary | ICD-10-CM | POA: Diagnosis not present

## 2021-08-28 MED ORDER — PREDNISONE 20 MG PO TABS: 20.0000 mg | ORAL_TABLET | Freq: Every day | ORAL | 0 refills | Status: DC | PRN

## 2021-08-28 NOTE — Telephone Encounter (Signed)
Called patient and got her scheduled for 11/7 at 12.

## 2021-08-28 NOTE — Assessment & Plan Note (Signed)
Almost 3 months from NSTEMI. Pt concerned metoprolol vs atorvastatin triggering worsening hives. Discussed that given significantly improved cholesterol would be safe to consider brief holiday to see if hives improve. May need to consider alternative to metoprolol trial

## 2021-08-28 NOTE — Assessment & Plan Note (Signed)
Pt reports several year hx of recurrent hives/swelling w/o clear trigger. Recent episode of hives resolved with benadryl but will also take prednisone once weekly for knee/ankle swelling associated. Reports negative rheum and ent evaluation. Advised allergy - referral placed. Will hold atorvastatin for a few days but recommended daily allergy pill to reduce recurrence as pt notes no known trigger.

## 2021-08-28 NOTE — Telephone Encounter (Signed)
Last refilled on 05/11/21 #180 with refills 0 LOV today 08/28/21   No future appointments

## 2021-08-28 NOTE — Progress Notes (Signed)
Subjective:     Holly Garner is a 40 y.o. female presenting for Allergic Reaction (Swelling and hives all over body started on 08/27/21. Took 2 benadryl and has improved )     Allergic Reaction   #Swelling/hives - since 2016 - husband use to own a landscaping company and got stuck with a weed and had significant hand swelling - poisonous weed - ever since then will get swelling in random spots  - saw Rheum, ENT - has not seen Allergy yet - swelling will occur randomly - typically with hives, itchy, raised lesions - if knees and feet swelling - will get pain  Took 2 benadryl yesterday with improvement - still itchy - raised lesions are resolving - wondering if it is related to medication  Hives getting worse since starting 2 medications - Atorvastatin 80 mg and metoprolol  - switched - to see when the swelling occurs  Will get swelling once a week - but not always with hives  Now getting hives 30 minutes after evening medication - which   Review of Systems   Social History   Tobacco Use  Smoking Status Former   Packs/day: 1.00   Years: 10.00   Pack years: 10.00   Types: Cigarettes   Quit date: 05/03/2021   Years since quitting: 0.3  Smokeless Tobacco Never        Objective:    BP Readings from Last 3 Encounters:  08/28/21 130/80  08/22/21 130/70  08/22/21 116/80   Wt Readings from Last 3 Encounters:  08/28/21 298 lb (135.2 kg)  08/22/21 (!) 304 lb (137.9 kg)  08/22/21 (!) 302 lb (137 kg)    BP 130/80   Pulse 87   Temp (!) 96.2 F (35.7 C) (Temporal)   Ht 5\' 5"  (1.651 m)   Wt 298 lb (135.2 kg)   LMP 08/22/2021 (Exact Date)   SpO2 100%   BMI 49.59 kg/m    Physical Exam Constitutional:      General: She is not in acute distress.    Appearance: She is well-developed. She is not diaphoretic.  HENT:     Right Ear: External ear normal.     Left Ear: External ear normal.  Eyes:     Conjunctiva/sclera: Conjunctivae normal.   Cardiovascular:     Rate and Rhythm: Normal rate and regular rhythm.     Heart sounds: No murmur heard. Pulmonary:     Effort: Pulmonary effort is normal. No respiratory distress.     Breath sounds: Normal breath sounds. No wheezing.  Musculoskeletal:     Cervical back: Neck supple.  Skin:    General: Skin is warm and dry.     Capillary Refill: Capillary refill takes less than 2 seconds.     Comments: Photos - with scattered raised erythematous wheels along the back, legs, arms  Neurological:     Mental Status: She is alert. Mental status is at baseline.  Psychiatric:        Mood and Affect: Mood normal.        Behavior: Behavior normal.          Assessment & Plan:   Problem List Items Addressed This Visit       Endocrine   Type 2 diabetes mellitus with other specified complication (HCC)    Discussed risk of prednisone with DM. She is monitoring CBG closely and discussed alternative treatment prior to prednisone. At this point seems to have taken 20 mg/week. Will work to  find cause and reduce hive/swelling occurrence.       Relevant Orders   AMB Referral to [facility]     Musculoskeletal and Integument   Hives - Primary    Pt reports several year hx of recurrent hives/swelling w/o clear trigger. Recent episode of hives resolved with benadryl but will also take prednisone once weekly for knee/ankle swelling associated. Reports negative rheum and ent evaluation. Advised allergy - referral placed. Will hold atorvastatin for a few days but recommended daily allergy pill to reduce recurrence as pt notes no known trigger.       Relevant Medications   predniSONE (DELTASONE) 20 MG tablet   Other Relevant Orders   Ambulatory referral to Allergy   AMB Referral to [facility]     Other   Swelling   Relevant Medications   predniSONE (DELTASONE) 20 MG tablet   Other Relevant Orders   Ambulatory referral to Allergy   AMB Referral to [facility] Coordin[facility]   Hx of non-ST elevation myocardial infarction (NSTEMI)    Almost 3 months from NSTEMI. Pt concerned metoprolol vs atorvastatin triggering worsening hives. Discussed that given significantly improved cholesterol would be safe to consider brief holiday to see if hives improve. May need to consider alternative to metoprolol trial      Relevant Orders   AMB Referral to [facility] Coordinaton   Pure hypercholesterolemia   Relevant Orders   AMB Referral to [facility] Coordinaton     Return in about 3 months (around 11/28/2021).  Lynnda Child, MD  This visit occurred during the SARS-CoV-2 public health emergency.  Safety protocols were in place, including screening questions prior to the visit, additional usage of staff PPE, and extensive cleaning of exam room while observing appropriate contact time as indicated for disinfecting solutions.

## 2021-08-28 NOTE — Assessment & Plan Note (Signed)
Discussed risk of prednisone with DM. She is monitoring CBG closely and discussed alternative treatment prior to prednisone. At this point seems to have taken 20 mg/week. Will work to find cause and reduce hive/swelling occurrence.

## 2021-08-28 NOTE — Patient Instructions (Signed)
#   Hives - Stop Atorvastatin for 2-7 days - monitor for hives - If improved (no hives) - call and will switch medication - if not improved (continue to have hives) - restart medication  After we figure out the Atorvastatin - Start a daily allergy pill (claritin, zyrtec, allegra, xyzal)   Swelling/Hives Action plan - 24 allergy pill - Benadryl every 6 hours - if swelling unbearable/no improvement after 12-24 hours - can take prednisone

## 2021-09-03 ENCOUNTER — Encounter: Payer: Self-pay | Admitting: Family Medicine

## 2021-09-03 DIAGNOSIS — E7800 Pure hypercholesterolemia, unspecified: Secondary | ICD-10-CM

## 2021-09-03 DIAGNOSIS — R609 Edema, unspecified: Secondary | ICD-10-CM

## 2021-09-03 DIAGNOSIS — L509 Urticaria, unspecified: Secondary | ICD-10-CM

## 2021-09-04 ENCOUNTER — Encounter: Payer: Self-pay | Admitting: *Deleted

## 2021-09-05 ENCOUNTER — Other Ambulatory Visit: Payer: Self-pay

## 2021-09-05 MED ORDER — ROSUVASTATIN CALCIUM 10 MG PO TABS: 10.0000 mg | ORAL_TABLET | Freq: Every day | ORAL | 0 refills | Status: DC

## 2021-09-05 NOTE — Patient Outreach (Signed)
[facility]) Care Management  09/05/2021  Holly Garner 10-02-1981 [id number]  Called patient to discuss potential medication allergy potentially related to atorvastatin. Patient reports that she would break out in hives 30 minutes after taking atorvastatin. She was seen by PCP on 08/28/21 and since that office visit has not taken the atorvastatin. She reports she has not developed any hives up until last night in which she reports the episode happening again. She states this is the only day it has occurred in the last 7 days.   Patient also states she has had issues with swelling in different places in her body since 2016 and that she has seen multiple doctors and specialists and nobody was able to identify the cause.  Referral was placed for allergist but appt has not been made yet as patient has not been contacted.  It appears there may be another cause to the hives, however, with the frequency in occurrence of the hives in relation to atorvastatin will recommend to PCP to trial rosuvastatin in place of this medication.   At this time patient has no further concerns related to managed medicaid services. PCP may send referral at any time in the future if additional services are requested.

## 2021-09-06 ENCOUNTER — Other Ambulatory Visit: Payer: Self-pay | Admitting: Nurse Practitioner

## 2021-09-06 DIAGNOSIS — E1169 Type 2 diabetes mellitus with other specified complication: Secondary | ICD-10-CM

## 2021-09-07 NOTE — Telephone Encounter (Signed)
Spoke with Morrie Sheldon with pharmacy stating she is not sure why we received the request, their are plenty of refills on file.

## 2021-09-07 NOTE — Telephone Encounter (Signed)
Thanks Rachelle. I asked her to reach out to her cardiologist!

## 2021-09-07 NOTE — Telephone Encounter (Signed)
Hi Rachelle,  See my chart message regarding rosuvastatin as you recommended.  Do you work with the cardiology group?  Mayra Reel, NP-C

## 2021-09-08 ENCOUNTER — Telehealth: Payer: Self-pay

## 2021-09-08 ENCOUNTER — Encounter: Payer: Self-pay | Admitting: Cardiovascular Disease

## 2021-09-08 NOTE — Telephone Encounter (Signed)
Pt sent MyChart message asking for office to call regarding medication. This RN reach out to pt, pt reports PC advised her to call Dr. Windell Hummingbird office in reference to her cholesterol medications. Pt was on atorvastatin, then started with allergic reaction of hives and itching. PCP then switch her to rosuvastatin, within a day she again started with Hives, redness, and severe itching. PCP advised to d/c this med as well and contact Dr. Mariah Milling for additional medication options.  Pt currently takes Zetia 10 mg QD, has been doing well with that.  Recent Lipids 08/22/2021  Component Ref Range & Units 2 wk ago 2 mo ago 4 mo ago 2 yr ago  Cholesterol 0 - 200 mg/dL 950   932  671 High  CM   Comment: ATP III Classification       Desirable:  < 200 mg/dL               Borderline High:  200 - 239 mg/dL          High:  > = 245 mg/dL  Triglycerides 0.0 - 809.9 mg/dL 83.3  825 R  053 High  R  185.0 High  CM   Comment: Normal:  <150 mg/dLBorderline High:  150 - 199 mg/dL  HDL >97.67 mg/dL 34.19 Low   36 Low  R  26 Low  R  29.40 Low    VLDL 0.0 - 40.0 mg/dL 37.9   37 R  02.4   LDL Cholesterol 0 - 99 mg/dL 65  097 High  CM  353 High    Total CHOL/HDL Ratio  3 4.5 High  R, CM  7.4 R  8 CM   Comment:                Men          Women1/2 Average Risk     3.4          3.3Average Risk          5.0          4.42X Average Risk          9.6          7.13X Average Risk          15.0          11.0                      NonHDL  82.59   197.00 CM    Total cholesterol 118 LDL 65 Numbers are good, gave suggestion of Repatha or Purlent as Q2 week injection, pt declines as "I already half to inject myself enough with insulin". Advised will send to Dr. Mariah Milling to see what he recommends, in meantime to continue Zetia 10 mg daily and work on diet and exercise to keep her numbers at gaol. Pt verbalized understanding, thankful for return call and will await Dr. Windell Hummingbird response.   Allergy list updated as well

## 2021-09-11 NOTE — Telephone Encounter (Signed)
Responded to pt via MyChart from last week's MyChart message regarding medication sent by Mrs. Vear Clock  Dr. Mariah Milling advised There has been a link between her cholesterol numbers and her diabetes numbers  As diabetes has gone down, cholesterol also improving  Will continue to push down the diabetes numbers, stay on the Zetia  Will continue to periodically watch labs  Few other options if she does not want injections  TGollan

## 2021-09-12 MED ORDER — PREDNISONE 20 MG PO TABS: ORAL_TABLET | ORAL | 0 refills | Status: DC

## 2021-09-18 ENCOUNTER — Ambulatory Visit: Payer: Medicaid Other | Admitting: Cardiology

## 2021-09-19 NOTE — Telephone Encounter (Signed)
Ashtyn, can you take a look at the original referral and my new urgent referral? Not sure what's going on.

## 2021-09-20 NOTE — Telephone Encounter (Signed)
Thank you :)

## 2021-09-20 NOTE — Telephone Encounter (Signed)
See 09/04/21 Mychart message - I sent the referral to Dr Irena Cords on 09/04/21 and sent the patient a Mychart message letting her know where her referral was sent. Epic shows she received and read the message. Dr Sharmon Revere office states that they have not received a call from her to schedule. They are behind on referrals so they request that she call to schedule her appt. I have sent another Mychart message to the patient letting her know.

## 2021-09-21 ENCOUNTER — Ambulatory Visit: Payer: Medicaid Other | Admitting: Obstetrics and Gynecology

## 2021-09-26 NOTE — Telephone Encounter (Signed)
Patient cancelled 07/01/2020 apt

## 2021-09-26 NOTE — Telephone Encounter (Signed)
Mirena rcvd 08/22/2021

## 2021-09-27 DIAGNOSIS — J301 Allergic rhinitis due to pollen: Secondary | ICD-10-CM | POA: Diagnosis not present

## 2021-09-27 DIAGNOSIS — J4520 Mild intermittent asthma, uncomplicated: Secondary | ICD-10-CM | POA: Diagnosis not present

## 2021-09-27 DIAGNOSIS — T783XXD Angioneurotic edema, subsequent encounter: Secondary | ICD-10-CM | POA: Diagnosis not present

## 2021-09-27 DIAGNOSIS — L501 Idiopathic urticaria: Secondary | ICD-10-CM | POA: Diagnosis not present

## 2021-10-10 NOTE — Telephone Encounter (Signed)
Will you set her up for a virtual or in person visit with me this week?

## 2021-10-10 NOTE — Telephone Encounter (Signed)
Called patient appointment made virtual

## 2021-10-12 ENCOUNTER — Other Ambulatory Visit: Payer: Self-pay

## 2021-10-12 ENCOUNTER — Encounter: Payer: Self-pay | Admitting: Primary Care

## 2021-10-12 ENCOUNTER — Telehealth (INDEPENDENT_AMBULATORY_CARE_PROVIDER_SITE_OTHER): Payer: Medicaid Other | Admitting: Primary Care

## 2021-10-12 VITALS — Ht 65.0 in | Wt 298.0 lb

## 2021-10-12 DIAGNOSIS — E7800 Pure hypercholesterolemia, unspecified: Secondary | ICD-10-CM

## 2021-10-12 DIAGNOSIS — E1169 Type 2 diabetes mellitus with other specified complication: Secondary | ICD-10-CM

## 2021-10-12 DIAGNOSIS — L509 Urticaria, unspecified: Secondary | ICD-10-CM

## 2021-10-12 NOTE — Assessment & Plan Note (Signed)
Off all statin therapy given increased hives on regimen.  Continue Zetia 10 mg for now.  Working closely with cardiology.

## 2021-10-12 NOTE — Progress Notes (Signed)
Patient ID: Holly Garner, female    DOB: 16-Feb-1981, 40 y.o.   MRN: [id number]  Virtual visit completed through [employer], a video enabled telemedicine application. Due to national recommendations of social distancing due to COVID-19, a virtual visit is felt to be most appropriate for this patient at this time. Reviewed limitations, risks, security and privacy concerns of performing a virtual visit and the availability of in person appointments. I also reviewed that there may be a patient responsible charge related to this service. The patient agreed to proceed.   Patient location: home Provider location:  at [facility], office Persons participating in this virtual visit: patient, provider   If any vitals were documented, they were collected by patient at home unless specified below.    Ht 5' 5"  (1.651 m)    Wt 298 lb (135.2 kg)    BMI 49.59 kg/m    CC: Hives Subjective:   HPI: Holly Garner is a very pleasant 40 y.o. female patient of Dr. Einar Pheasant with a history of hives, type 2 diabetes, seasonal allergies, hypertension, CAD with NSTEMI, tobacco abuse presenting on 10/12/2021 for to discuss hives.  Chronic swelling of joints and extremities for years. Swelling occurs to knees, feet, and hands mostly. Has seen rheumatology for this in the past, no one can provide her with a reason for her swelling.   Hives are intermittent, wax and wane to entire body at differing times, originally began in July 2022 just after her hospitalization for NSTEMI. Hospital notes from 05/05/21 reveal "allergic reaction with skin redness" that was suspected to be secondary to IV contrast.   During that hospitalization she was initiated on aspirin, Brilinta, Lantus insulin, metoprolol succinate, atorvastatin, Novolog.   Metformin was initiated 05/11/21 and Novolog was discontinued. She was initiated on Zetia 10 mg (in addition to atorvastatin 80 mg) 06/15/21 for continued reduction in LDL.    She's been on losartan in the past on higher doses, never had hives. Is taking losartan 25 mg.   Evaluated by PCP on 08/28/21 for hives, endorsed hives and swelling since 2016, but today she endorses only swelling since 2016 and that the hives began in July 2022. During her PCP visit it was recommended  she hold atorvastatin, some reduction in hives. It was recommended by pharmacy to try rosuvastatin which escalated hives so this was discontinued.   Today she has hives to her bilateral upper extremities, large, circular, red, itchy hives. She is no longer on statin therapy, but does recall hives being worse when taking statins.   She has historically been prescribed prednisone for hives and swelling, has a collection of pills at home for which she takes once weekly, one 20 mg dose, which relieves the swelling and hives temporarily.  Evaluated by an allergist recently, underwent allergy testing, was told that she was allergic to grasses and dust mites. She believes she underwent food panel testing and was told she had no food allergies.   She takes Xyzal 5 mg once daily and Allegra 180 mg daily.   Her Ozempic Pen broke yesterday, cannot get another refill for another three weeks.  She would like to discontinue Ozempic as she has not seen any benefit for weight loss.  She's checking glucose readings which are ranging 120's-130's. Compliant to metformin, has been reducing her dose of Lantus and is down to 18 units given good glucose control through dietary changes.  Ozempic was initiated in early October 2022.  Relevant past medical, surgical, family and social history reviewed and updated as indicated. Interim medical history since our last visit reviewed. Allergies and medications reviewed and updated. Outpatient Medications Prior to Visit  Medication Sig Dispense Refill   ACCU-CHEK GUIDE test strip USE TO TEST BLOOD SUGARS UP TO FOUR TIMES DAILY 100 each 1   Accu-Chek Softclix Lancets  lancets USE TO TEST BLOOD SUGARS UP TO FOUR TIMES DAILY 100 each 1   aspirin 81 MG chewable tablet Chew 1 tablet (81 mg total) by mouth daily. 30 tablet 0   blood glucose meter kit and supplies KIT Dispense based on patient and insurance preference. Use up to four times daily as directed. 1 each 0   BRILINTA 90 MG TABS tablet TAKE 1 TABLET BY MOUTH TWICE A DAY 180 tablet 0   Continuous Blood Gluc Sensor (FREESTYLE LIBRE 2 SENSOR) MISC 1 each by Does not apply route as directed. 2 each 11   EPINEPHrine (EPIPEN 2-PAK) 0.3 mg/0.3 mL IJ SOAJ injection Inject 0.3 mLs (0.3 mg total) into the muscle once. 2 Device 0   ezetimibe (ZETIA) 10 MG tablet Take 1 tablet (10 mg total) by mouth daily. 90 tablet 3   insulin glargine (LANTUS) 100 UNIT/ML injection Inject 0.4 mLs (40 Units total) into the skin daily. (Patient taking differently: Inject 40 Units into the skin daily. 18 u) 12 mL 2   Insulin Pen Needle (PEN NEEDLES) 30G X 8 MM MISC 1 each by Does not apply route daily. 100 each 3   losartan (COZAAR) 25 MG tablet Take 1 tablet (25 mg total) by mouth daily. 90 tablet 3   metFORMIN (GLUCOPHAGE) 1000 MG tablet Take 1 tablet (1,000 mg total) by mouth 2 (two) times daily with a meal. 180 tablet 3   metoprolol succinate (TOPROL-XL) 25 MG 24 hr tablet Take 1 tablet (25 mg total) by mouth daily. 90 tablet 3   nitroGLYCERIN (NITROSTAT) 0.4 MG SL tablet Place 1 tablet (0.4 mg total) under the tongue every 5 (five) minutes as needed for chest pain. 25 tablet 3   predniSONE (DELTASONE) 20 MG tablet Take 2 tablets by mouth once daily for 5 days for hives. 10 tablet 0   PROAIR HFA 108 (90 Base) MCG/ACT inhaler Inhale 1-2 puffs into the lungs every 6 (six) hours as needed for wheezing or shortness of breath. 8.5 g 1   levocetirizine (XYZAL) 5 MG tablet Take 5 mg by mouth daily.     Semaglutide, 1 MG/DOSE, 4 MG/3ML SOPN Inject 1 mg as directed once a week. (Patient not taking: Reported on 10/12/2021) 3 mL 3   No  facility-administered medications prior to visit.     Per HPI unless specifically indicated in ROS section below Review of Systems  HENT:  Negative for trouble swallowing.   Respiratory:  Negative for shortness of breath and wheezing.   Skin:  Positive for rash.       Pruritus  Objective:  Ht 5' 5"  (1.651 m)    Wt 298 lb (135.2 kg)    BMI 49.59 kg/m   Wt Readings from Last 3 Encounters:  10/12/21 298 lb (135.2 kg)  08/28/21 298 lb (135.2 kg)  08/22/21 (!) 304 lb (137.9 kg)       Physical exam: General: Alert and oriented x 3, no distress, does not appear sickly  Pulmonary: Speaks in complete sentences without increased work of breathing, no cough during visit.  Psychiatric: Normal mood, thought content, and behavior.  Skin:  Circular, red, slightly raised rash to bilateral upper extremities in multiple places.  Represents hives.     Results for orders placed or performed in visit on 08/22/21  Comprehensive metabolic panel  Result Value Ref Range   Sodium 139 135 - 145 mEq/L   Potassium 4.3 3.5 - 5.1 mEq/L   Chloride 103 96 - 112 mEq/L   CO2 28 19 - 32 mEq/L   Glucose, Bld 115 (H) 70 - 99 mg/dL   BUN 17 6 - 23 mg/dL   Creatinine, Ser 0.63 0.40 - 1.20 mg/dL   Total Bilirubin 0.4 0.2 - 1.2 mg/dL   Alkaline Phosphatase 113 39 - 117 U/L   AST 13 0 - 37 U/L   ALT 14 0 - 35 U/L   Total Protein 6.3 6.0 - 8.3 g/dL   Albumin 3.7 3.5 - 5.2 g/dL   GFR 112.87 >60.00 mL/min   Calcium 8.7 8.4 - 10.5 mg/dL  Lipid panel  Result Value Ref Range   Cholesterol 118 0 - 200 mg/dL   Triglycerides 89.0 0.0 - 149.0 mg/dL   HDL 35.60 (L) >39.00 mg/dL   VLDL 17.8 0.0 - 40.0 mg/dL   LDL Cholesterol 65 0 - 99 mg/dL   Total CHOL/HDL Ratio 3    NonHDL 82.59    Assessment & Plan:   Problem List Items Addressed This Visit       Endocrine   Type 2 diabetes mellitus with other specified complication ([place]) - Primary    We will have to hold Ozempic for 3 weeks as her current pen is  malfunctioning.  She would also like to discontinue  Continue Lantus 18 units daily, metformin 1000 mg twice daily.  Repeat A1c in early January 2023. She will monitor sugars and notify us if glucose readings are beginning to rise without Ozempic.      Relevant Orders   Hemoglobin A1c     Musculoskeletal and Integument   Hives    Continued despite discontinuation of statin therapy.  Suspect her allergy stems back to her hospitalization in July 2022 for her NSTEMI.  Potential causes include Brilinta, aspirin, metoprolol, drug-eluting stent.  Discussed with cardiology today who agrees to start by holding metoprolol first.  Will notify patient.  Other medications such as Zetia, metformin, Ozempic restarted after hospitalization and therefore have a lower chance of being the culprit for her hives.  Continue Xyzal and Allegra. Discussed to add famotidine 20 mg daily.  Patient will update in 1 to 2 weeks regarding hives after temporary hold of metoprolol succinate.        Other   Pure hypercholesterolemia    Off all statin therapy given increased hives on regimen.  Continue Zetia 10 mg for now.  Working closely with cardiology.        No orders of the defined types were placed in this encounter.  Orders Placed This Encounter  Procedures   Hemoglobin A1c    Standing Status:   Future    Standing Expiration Date:   10/12/2022    I discussed the assessment and treatment plan with the patient. The patient was provided an opportunity to ask questions and all were answered. The patient agreed with the plan and demonstrated an understanding of the instructions. The patient was advised to call back or seek an in-person evaluation if the symptoms worsen or if the condition fails to improve as anticipated.  Follow up plan:  Add famotidine 20 mg daily for hives.  Stop your  metoprolol succinate medication for 1 week, update me in 1 week regarding your hives.  Schedule a lab  appointment for on or after October 31, 2021 to repeat your A1c.  Keep a watch on your glucose levels as discussed.  It was a pleasure to see you today!   Pleas Koch, NP

## 2021-10-12 NOTE — Assessment & Plan Note (Addendum)
Continued despite discontinuation of statin therapy.  Suspect her allergy stems back to her hospitalization in July 2022 for her NSTEMI.  Potential causes include Brilinta, aspirin, metoprolol, drug-eluting stent.  Discussed with cardiology today who agrees to start by holding metoprolol first.  Will notify patient.  Other medications such as Zetia, metformin, Ozempic restarted after hospitalization and therefore have a lower chance of being the culprit for her hives.  Continue Xyzal and Allegra. Discussed to add famotidine 20 mg daily.  Patient will update in 1 to 2 weeks regarding hives after temporary hold of metoprolol succinate.  Hospital notes reviewed from hospital stay in July 2022.

## 2021-10-12 NOTE — Assessment & Plan Note (Signed)
We will have to hold Ozempic for 3 weeks as her current pen is malfunctioning.  She would also like to discontinue  Continue Lantus 18 units daily, metformin 1000 mg twice daily.  Repeat A1c in early January 2023. She will monitor sugars and notify us if glucose readings are beginning to rise without Ozempic.

## 2021-10-12 NOTE — Patient Instructions (Signed)
Add famotidine 20 mg daily for hives.  Stop your metoprolol succinate medication for 1 week, update me in 1 week regarding your hives.  Schedule a lab appointment for on or after October 31, 2021 to repeat your A1c.  Keep a watch on your glucose levels as discussed.  It was a pleasure to see you today!

## 2021-10-27 ENCOUNTER — Other Ambulatory Visit: Payer: Self-pay | Admitting: Family Medicine

## 2021-10-27 ENCOUNTER — Other Ambulatory Visit: Payer: Self-pay | Admitting: Nurse Practitioner

## 2021-10-27 DIAGNOSIS — L509 Urticaria, unspecified: Secondary | ICD-10-CM

## 2021-10-27 DIAGNOSIS — R609 Edema, unspecified: Secondary | ICD-10-CM

## 2021-10-27 DIAGNOSIS — E1169 Type 2 diabetes mellitus with other specified complication: Secondary | ICD-10-CM

## 2021-10-30 MED ORDER — PREDNISONE 20 MG PO TABS: ORAL_TABLET | ORAL | 0 refills | Status: DC

## 2021-10-30 NOTE — Telephone Encounter (Signed)
I sent both to [facility]

## 2021-10-30 NOTE — Telephone Encounter (Signed)
Spoke with patient. Patient does need refill on on Lantus-patient states she uses 14 units once daily now. Dr Selena Batten told patient she could go down on her units every 2 to 3 days as long as her sugar level stays below 145 and it has been.  Also, patient is asking for refill on Prednisone 20 mg that she takes as needed for leg swelling, when this happens she takes about 1 tablet once or twice a week. Patient states Dr Selena Batten is aware and patient has been to several different specialist for this and no one can find the cause.  Since patient switched from [facility] as PCP and not to have his name attached to the medication sending this to another provider to review instead in this case.

## 2021-10-31 ENCOUNTER — Other Ambulatory Visit (INDEPENDENT_AMBULATORY_CARE_PROVIDER_SITE_OTHER): Payer: Medicaid Other

## 2021-10-31 ENCOUNTER — Other Ambulatory Visit: Payer: Self-pay

## 2021-10-31 DIAGNOSIS — E1169 Type 2 diabetes mellitus with other specified complication: Secondary | ICD-10-CM

## 2021-10-31 LAB — HEMOGLOBIN A1C: Hgb A1c MFr Bld: 7.3 % — ABNORMAL HIGH (ref 4.6–6.5)

## 2021-11-02 MED ORDER — HYDROXYZINE HCL 10 MG PO TABS: 10.0000 mg | ORAL_TABLET | Freq: Three times a day (TID) | ORAL | 0 refills | Status: DC | PRN

## 2021-11-17 ENCOUNTER — Telehealth: Payer: Self-pay | Admitting: Family Medicine

## 2021-11-17 NOTE — Telephone Encounter (Signed)
..  Patient declines further follow up and engagement by the Managed Medicaid Team. Appropriate care team members and provider have been notified via electronic communication. The Managed Medicaid Team is available to follow up with the patient after provider conversation with the patient regarding recommendation for engagement and subsequent re-referral to the Managed Medicaid Team.    Jennifer Alley Care Guide, High Risk Medicaid Managed Care Embedded Care Coordination [place]  [facility]   

## 2021-11-30 ENCOUNTER — Telehealth: Payer: Self-pay | Admitting: Family Medicine

## 2021-11-30 MED ORDER — TICAGRELOR 90 MG PO TABS: 90.0000 mg | ORAL_TABLET | Freq: Two times a day (BID) | ORAL | 0 refills | Status: DC

## 2021-11-30 NOTE — Addendum Note (Signed)
Addended by: Erby Pian on: 11/30/2021 04:31 PM   Modules accepted: Orders

## 2021-11-30 NOTE — Telephone Encounter (Signed)
Pt needs a refill on BRILINTA 90 MG TABS tablet sent to [facility] Pt is out of medication

## 2021-12-01 ENCOUNTER — Telehealth: Payer: Self-pay

## 2021-12-01 NOTE — Telephone Encounter (Signed)
PA Freestyle libre2 sensor submitted via covermymeds. Pending decision Key: [id number] - PA Case ID: [id number] - Rx #: [phone]

## 2021-12-04 ENCOUNTER — Ambulatory Visit: Payer: Medicaid Other | Admitting: Nurse Practitioner

## 2021-12-04 NOTE — Telephone Encounter (Signed)
PA DENIED. Pharmacy advised via fax.

## 2021-12-05 ENCOUNTER — Other Ambulatory Visit: Payer: Self-pay | Admitting: Family Medicine

## 2021-12-05 ENCOUNTER — Encounter: Payer: Self-pay | Admitting: Family Medicine

## 2021-12-05 DIAGNOSIS — L509 Urticaria, unspecified: Secondary | ICD-10-CM

## 2021-12-05 DIAGNOSIS — R609 Edema, unspecified: Secondary | ICD-10-CM

## 2021-12-07 ENCOUNTER — Other Ambulatory Visit: Payer: Self-pay | Admitting: Family Medicine

## 2021-12-07 ENCOUNTER — Encounter: Payer: Self-pay | Admitting: Family Medicine

## 2021-12-07 ENCOUNTER — Telehealth: Payer: Self-pay | Admitting: Pharmacist

## 2021-12-07 ENCOUNTER — Other Ambulatory Visit: Payer: Self-pay

## 2021-12-07 ENCOUNTER — Ambulatory Visit: Payer: Medicaid Other | Admitting: Family Medicine

## 2021-12-07 VITALS — BP 102/80 | HR 69 | Temp 97.9°F | Ht 65.0 in | Wt 311.6 lb

## 2021-12-07 DIAGNOSIS — E1169 Type 2 diabetes mellitus with other specified complication: Secondary | ICD-10-CM

## 2021-12-07 DIAGNOSIS — Z0000 Encounter for general adult medical examination without abnormal findings: Secondary | ICD-10-CM | POA: Diagnosis not present

## 2021-12-07 DIAGNOSIS — H66001 Acute suppurative otitis media without spontaneous rupture of ear drum, right ear: Secondary | ICD-10-CM

## 2021-12-07 DIAGNOSIS — R609 Edema, unspecified: Secondary | ICD-10-CM

## 2021-12-07 DIAGNOSIS — L509 Urticaria, unspecified: Secondary | ICD-10-CM

## 2021-12-07 DIAGNOSIS — T3695XA Adverse effect of unspecified systemic antibiotic, initial encounter: Secondary | ICD-10-CM

## 2021-12-07 DIAGNOSIS — I10 Essential (primary) hypertension: Secondary | ICD-10-CM

## 2021-12-07 DIAGNOSIS — I252 Old myocardial infarction: Secondary | ICD-10-CM

## 2021-12-07 DIAGNOSIS — B379 Candidiasis, unspecified: Secondary | ICD-10-CM

## 2021-12-07 MED ORDER — AMOXICILLIN-POT CLAVULANATE 875-125 MG PO TABS: 1.0000 | ORAL_TABLET | Freq: Two times a day (BID) | ORAL | 0 refills | Status: AC

## 2021-12-07 MED ORDER — FLUCONAZOLE 150 MG PO TABS: 150.0000 mg | ORAL_TABLET | Freq: Once | ORAL | 0 refills | Status: AC

## 2021-12-07 MED ORDER — OZEMPIC (0.25 OR 0.5 MG/DOSE) 2 MG/1.5ML ~~LOC~~ SOPN: 0.5000 mg | PEN_INJECTOR | SUBCUTANEOUS | 0 refills | Status: DC

## 2021-12-07 NOTE — Telephone Encounter (Signed)
Will route to PCP's assistant since pt was seen today

## 2021-12-07 NOTE — Telephone Encounter (Signed)
Thank you :)

## 2021-12-07 NOTE — Assessment & Plan Note (Signed)
Resolved after stopping metoprolol and statins.  On Zetia.  Following with cardiology.  Will discuss alternative beta-blocker.

## 2021-12-07 NOTE — Assessment & Plan Note (Signed)
Blood pressure controlled.  Patient with hives secondary to metoprolol.  Will reach out to cardiology about alternative agent.

## 2021-12-07 NOTE — Assessment & Plan Note (Signed)
Lab Results  Component Value Date   HGBA1C 7.3 (H) 10/31/2021  CBGs at home improved.  Will restart Ozempic.  Patient will continue to try to decrease Lantus, based on CBGs. Return in 3 months for DM f/u.

## 2021-12-07 NOTE — Patient Instructions (Signed)
Metoprolol - will check with cardiology on alternative   Diabetes - start ozempic 0.5 mg - after 4 weeks increase to 1 mg (will need new pen)  - update after 8 weeks - return in 3 months for diabetes - OK to decrease lantus - following CBGs  Bariatric Surgery - update me if you need referral

## 2021-12-07 NOTE — Telephone Encounter (Signed)
Reviewed Freestyle [place] PA process - patient has been receiving Libre at no charge last year. Recently a PA was submitted through CoverMyMeds and was denied - it looks like the PA was submitted for initial therapy, not continuation of therapy, and this may be why it was denied. I resubmitted a PA for continuation of therapy and will await determination.  Key: [id number]

## 2021-12-07 NOTE — Progress Notes (Signed)
Annual Exam   Chief Complaint:  Chief Complaint  Patient presents with   Follow-up    DM eye exam scheduled for 01/22/22    History of Present Illness:  [name] is a 41 y.o. [phone] who LMP was No LMP recorded., presents today for her annual examination.    Diabetes - stopped her ozempic due to broken pen and no refill - CBG 102-140 - fasting - has decreased lantus to 10 units - wondering about bariatric surgery - is worried about being put to sleep  Nutrition/Lifestyle Diet: diabetic diet - as much as possible Exercise: walking and moving at work She does get adequate calcium and Vitamin D in her diet.  Social History   Tobacco Use  Smoking Status Former   Packs/day: 1.00   Years: 10.00   Pack years: 10.00   Types: Cigarettes   Quit date: 05/03/2021   Years since quitting: 0.5  Smokeless Tobacco Never   Social History   Substance and Sexual Activity  Alcohol Use No   Social History   Substance and Sexual Activity  Drug Use No     Safety The patient wears seatbelts: yes.     The patient feels safe at home and in their relationships: yes.  General Health Dentist in the last year: No Eye doctor: not applicable  Menstrual IUD with occasional spotting  GYN She is single partner, contraception - IUD.     Cervical Cancer Screening (Age 32-65) Last Pap:  August 2021 Results were: no abnormalities with HPV not done  Family History of Breast Cancer: no Family History of Ovarian Cancer: no    Weight Wt Readings from Last 3 Encounters:  12/07/21 (!) 311 lb 9 oz (141.3 kg)  10/12/21 298 lb (135.2 kg)  08/28/21 298 lb (135.2 kg)   Patient has very high BMI  BMI Readings from Last 1 Encounters:  12/07/21 51.85 kg/m     Chronic disease screening Blood pressure monitoring:  BP Readings from Last 3 Encounters:  12/07/21 102/80  08/28/21 130/80  08/22/21 130/70     Lipid Monitoring: Indication for screening: age >70, obesity,  diabetes, family hx, CV risk factors.  Lipid screening: up to date  Lab Results  Component Value Date   CHOL 118 08/22/2021   HDL 35.60 (L) 08/22/2021   LDLCALC 65 08/22/2021   TRIG 89.0 08/22/2021   CHOLHDL 3 08/22/2021     Diabetes Screening: age >36, overweight, family hx, PCOS, hx of gestational diabetes, at risk ethnicity, elevated blood pressure >135/80.  Diabetes Screening screening: Not Indicated  Lab Results  Component Value Date   HGBA1C 7.3 (H) 10/31/2021      Past Medical History:  Diagnosis Date   BMI 50.0-59.9, adult ([place])    CAD (coronary artery disease)    a. 04/2021 NSTEMI/PCI: LM nl, LAD 79m 30d, D1 nl, LCX 628mOM1 min irregs, RCA 20p, 30p/m, 100d (2.5x26 Resolute Onyx DES).   Essential hypertension    High cholesterol    History of echocardiogram    a. 04/2021 Echo: EF 60-65%. No rwma. Nl RV fxn.   History of Papanicolaou smear of cervix 04/2011   normal per pt.   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   hgsil   Tobacco abuse    [facility]    Past Surgical History:  Procedure Laterality Date   COLPOSCOPY  09/28/2009   CORONARY STENT INTERVENTION N/A 05/04/2021   Procedure: CORONARY STENT  INTERVENTION;  Surgeon: Wellington Hampshire, MD;  Location: [place] [facility];  Service: Cardiovascular;  Laterality: N/A;   LEFT HEART CATH AND CORONARY ANGIOGRAPHY N/A 05/04/2021   Procedure: LEFT HEART CATH AND CORONARY ANGIOGRAPHY;  Surgeon: Wellington Hampshire, MD;  Location: [place] [facility];  Service: Cardiovascular;  Laterality: N/A;   TONSILLECTOMY AND ADENOIDECTOMY     TYMPANOSTOMY TUBE PLACEMENT Bilateral     Prior to Admission medications   Medication Sig Start Date End Date Taking? Authorizing Provider  ACCU-CHEK GUIDE test strip USE TO TEST BLOOD SUGARS UP TO FOUR TIMES DAILY 05/31/21  Yes Michela Pitcher, NP  Accu-Chek Softclix Lancets lancets USE TO TEST BLOOD SUGARS UP TO FOUR TIMES DAILY 05/31/21  Yes Michela Pitcher, NP  aspirin 81 MG chewable tablet Chew 1 tablet (81 mg total) by mouth daily. 05/06/21  Yes Sharen Hones, MD  blood glucose meter kit and supplies KIT Dispense based on patient and insurance preference. Use up to four times daily as directed. 05/05/21  Yes Sharen Hones, MD  Continuous Blood Gluc Sensor (FREESTYLE LIBRE 2 SENSOR) MISC 1 each by Does not apply route as directed. 08/16/21  Yes Michela Pitcher, NP  EPINEPHrine (EPIPEN 2-PAK) 0.3 mg/0.3 mL IJ SOAJ injection Inject 0.3 mLs (0.3 mg total) into the muscle once. 01/07/16  Yes Gouru, Illene Silver, MD  ezetimibe (ZETIA) 10 MG tablet Take 1 tablet (10 mg total) by mouth daily. 06/22/21 12/17/22 Yes Theora Gianotti, NP  hydrOXYzine (ATARAX) 10 MG tablet Take 1-2 tablets (10-20 mg total) by mouth 3 (three) times daily as needed for itching. 11/02/21  Yes Pleas Koch, NP  insulin glargine (LANTUS) 100 UNIT/ML injection Inject 0.14 mLs (14 Units total) into the skin daily. 10/30/21  Yes Tower, Wynelle Fanny, MD  Insulin Pen Needle (PEN NEEDLES) 30G X 8 MM MISC 1 each by Does not apply route daily. 05/31/21  Yes Michela Pitcher, NP  levocetirizine (XYZAL) 5 MG tablet Take 5 mg by mouth daily. 09/27/21  Yes [provider]  losartan (COZAAR) 25 MG tablet Take 1 tablet (25 mg total) by mouth daily. 07/24/21  Yes Minna Merritts, MD  metFORMIN (GLUCOPHAGE) 1000 MG tablet Take 1 tablet (1,000 mg total) by mouth 2 (two) times daily with a meal. 06/29/21  Yes Michela Pitcher, NP  nitroGLYCERIN (NITROSTAT) 0.4 MG SL tablet Place 1 tablet (0.4 mg total) under the tongue every 5 (five) minutes as needed for chest pain. 07/24/21  Yes Minna Merritts, MD  predniSONE (DELTASONE) 20 MG tablet Take by mouth as directed for swelling 10/30/21  Yes Tower, Wynelle Fanny, MD  PROAIR HFA 108 [phone] Base) MCG/ACT inhaler Inhale 1-2 puffs into the lungs every 6 (six) hours as needed for wheezing or shortness of breath. 07/24/21  Yes Minna Merritts, MD  ticagrelor (BRILINTA)  90 MG TABS tablet Take 1 tablet (90 mg total) by mouth 2 (two) times daily. 11/30/21  Yes Lesleigh Noe, MD  Semaglutide, 1 MG/DOSE, 4 MG/3ML SOPN Inject 1 mg as directed once a week. Patient not taking: Reported on 10/12/2021 09/19/21   Lesleigh Noe, MD    Allergies  Allergen Reactions   Metoprolol Hives   Statins Hives   Atorvastatin Hives and Itching   Rosuvastatin Hives and Itching   Contrast Media [Iodinated Contrast Media] Itching    Gynecologic History: No LMP recorded.  Obstetric History: [id number]  Social History   Socioeconomic History  Marital status: Married    Spouse name: Not on file   Number of children: Not on file   Years of education: 12   Highest education level: Not on file  Occupational History   Occupation: unemployed   Occupation: homemaker  Tobacco Use   Smoking status: Former    Packs/day: 1.00    Years: 10.00    Pack years: 10.00    Types: Cigarettes    Quit date: 05/03/2021    Years since quitting: 0.5   Smokeless tobacco: Never  Vaping Use   Vaping Use: Never used  Substance and Sexual Activity   Alcohol use: No   Drug use: No   Sexual activity: Yes    Birth control/protection: Surgical    Comment: Vasectomy  Other Topics Concern   Not on file  Social History Narrative   Not on file   Social Determinants of Health   Financial Resource Strain: Low Risk    Difficulty of Paying Living Expenses: Not hard at all  Food Insecurity: No Food Insecurity   Worried About Charity fundraiser in the Last Year: Never true   [place] in the Last Year: Never true  Transportation Needs: No Transportation Needs   Lack of Transportation (Medical): No   Lack of Transportation (Non-Medical): No  Physical Activity: Not on file  Stress: Not on file  Social Connections: Not on file  Intimate Partner Violence: Not on file    Family History  Problem Relation Age of Onset   Diabetes Father    Breast cancer Maternal Grandmother        late  years   Diabetes Paternal Grandfather     Review of Systems  Constitutional:  Negative for chills and fever.  HENT:  Positive for ear pain. Negative for congestion and sore throat.   Eyes:  Negative for blurred vision and double vision.  Respiratory:  Negative for shortness of breath.   Cardiovascular:  Negative for chest pain.  Gastrointestinal:  Negative for heartburn, nausea and vomiting.  Genitourinary: Negative.   Musculoskeletal: Negative.  Negative for myalgias.  Skin:  Negative for rash.  Neurological:  Negative for dizziness and headaches.  Endo/Heme/Allergies:  Does not bruise/bleed easily.  Psychiatric/Behavioral:  Negative for depression. The patient is not nervous/anxious.     Physical Exam BP 102/80    Pulse 69    Temp 97.9 F (36.6 C) (Oral)    Ht _0  (1.651 m)    Wt (!) 311 lb 9 oz (141.3 kg)    SpO2 100%    BMI 51.85 kg/m    BP Readings from Last 3 Encounters:  12/07/21 102/80  08/28/21 130/80  08/22/21 130/70    Wt Readings from Last 3 Encounters:  12/07/21 (!) 311 lb 9 oz (141.3 kg)  10/12/21 298 lb (135.2 kg)  08/28/21 298 lb (135.2 kg)     Physical Exam Constitutional:      General: She is not in acute distress.    Appearance: She is well-developed. She is not diaphoretic.  HENT:     Head: Normocephalic and atraumatic.     Right Ear: External ear normal. A middle ear effusion is present. A PE tube is present.     Left Ear: Tympanic membrane, ear canal and external ear normal.     Nose: Nose normal.  Eyes:     General: No scleral icterus.    Extraocular Movements: Extraocular movements intact.     Conjunctiva/sclera: Conjunctivae normal.  Cardiovascular:     Rate and Rhythm: Normal rate and regular rhythm.     Heart sounds: No murmur heard. Pulmonary:     Effort: Pulmonary effort is normal. No respiratory distress.     Breath sounds: Normal breath sounds. No wheezing.  Abdominal:     General: Bowel sounds are normal. There is no  distension.     Palpations: Abdomen is soft. There is no mass.     Tenderness: There is no abdominal tenderness. There is no guarding or rebound.  Musculoskeletal:        General: Normal range of motion.     Cervical back: Neck supple.  Lymphadenopathy:     Cervical: No cervical adenopathy.  Skin:    General: Skin is warm and dry.     Capillary Refill: Capillary refill takes less than 2 seconds.  Neurological:     Mental Status: She is alert and oriented to person, place, and time.     Deep Tendon Reflexes: Reflexes normal.  Psychiatric:        Mood and Affect: Mood normal.        Behavior: Behavior normal.      Results: Depression screen [facility] 2/9 08/22/2021 06/19/2021 05/24/2021  Decreased Interest 0 0 0  Down, Depressed, Hopeless 0 0 0  PHQ - 2 Score 0 0 0  Altered sleeping - 2 -  Tired, decreased energy - 1 -  Change in appetite - 0 -  Feeling bad or failure about yourself  - 0 -  Trouble concentrating - 0 -  Moving slowly or fidgety/restless - 0 -  Suicidal thoughts - 0 -  PHQ-9 Score - 3 -  Difficult doing work/chores - Not difficult at all -      Assessment: 41 y.o. [id number] female here for routine annual examination.  Plan: Problem List Items Addressed This Visit       Cardiovascular and Mediastinum   HTN (hypertension)     Endocrine   Type 2 diabetes mellitus with other specified complication ([place]) - Primary    Lab Results  Component Value Date   HGBA1C 7.3 (H) 10/31/2021  CBGs at home improved.  Will restart Ozempic.  Patient will continue to try to decrease Lantus, based on CBGs. Return in 3 months for DM f/u.        Relevant Medications   Semaglutide,0.25 or 0.5MG/DOS, (OZEMPIC, 0.25 OR 0.5 MG/DOSE,) 2 MG/1.5ML SOPN     Musculoskeletal and Integument   Hives    Resolved after stopping metoprolol and statins.  On Zetia.  Following with cardiology.  Will discuss alternative beta-blocker.        Other   Hx of non-ST elevation myocardial infarction  (NSTEMI)    Blood pressure controlled.  Patient with hives secondary to metoprolol.  Will reach out to cardiology about alternative agent.      Other Visit Diagnoses     Non-recurrent acute suppurative otitis media of right ear without spontaneous rupture of tympanic membrane       Relevant Medications   amoxicillin-clavulanate (AUGMENTIN) 875-125 MG tablet   fluconazole (DIFLUCAN) 150 MG tablet   Antibiotic-induced yeast infection       Relevant Medications   fluconazole (DIFLUCAN) 150 MG tablet        Screening: -- Blood pressure screen normal -- cholesterol screening: not due for screening -- Weight screening: obese: discussed management options, including lifestyle, dietary, and exercise. -- Diabetes Screening:  on treatment -- Nutrition: encouraged healthy diet  Psych -- Depression screening (PHQ-9):  Flowsheet Row Cardiac Rehab from [facility]/19/2021 in [facility]  PHQ-9 Total Score 3        Safety -- tobacco screening: not using -- alcohol screening:  low-risk usage. -- no evidence of domestic violence or intimate partner violence.   Cancer Screening -- pap smear not collected per ASCCP guidelines -- family history of breast cancer screening: done. not at high risk.   Immunizations Immunization History  Administered Date(s) Administered   Influenza,inj,Quad PF,6+ Mos 07/07/2019   PFIZER(Purple Top)SARS-COV-2 Vaccination 11/22/2020, 12/19/2020    -- flu vaccine not up to date - declined -- TDAP q10 years not up to date - declined -- PPSV-23 (19-64 with chronic disease or smoking) not up to date - declined  -- Covid-19 Vaccine up to date  Encouraged regular vision and dental screening. Encouraged healthy exercise and diet.   Lesleigh Noe

## 2021-12-07 NOTE — Telephone Encounter (Signed)
Noted thank you

## 2021-12-08 NOTE — Telephone Encounter (Signed)
PA approved for [facility] 2 for time period 12/08/21 - 12/08/22.

## 2021-12-15 ENCOUNTER — Encounter: Payer: Self-pay | Admitting: *Deleted

## 2021-12-18 DIAGNOSIS — J029 Acute pharyngitis, unspecified: Secondary | ICD-10-CM | POA: Diagnosis not present

## 2021-12-18 DIAGNOSIS — L509 Urticaria, unspecified: Secondary | ICD-10-CM | POA: Diagnosis not present

## 2021-12-19 ENCOUNTER — Emergency Department
Admission: EM | Admit: 2021-12-19 | Discharge: 2021-12-20 | Disposition: A | Discharge status: Home / Self Care | Payer: Medicaid Other | Attending: Emergency Medicine | Admitting: Emergency Medicine

## 2021-12-19 ENCOUNTER — Encounter: Payer: Self-pay | Admitting: Family Medicine

## 2021-12-19 ENCOUNTER — Other Ambulatory Visit: Payer: Self-pay

## 2021-12-19 ENCOUNTER — Encounter: Payer: Self-pay | Admitting: *Deleted

## 2021-12-19 DIAGNOSIS — R198 Other specified symptoms and signs involving the digestive system and abdomen: Secondary | ICD-10-CM | POA: Diagnosis present

## 2021-12-19 DIAGNOSIS — D72829 Elevated white blood cell count, unspecified: Secondary | ICD-10-CM | POA: Diagnosis not present

## 2021-12-19 DIAGNOSIS — I10 Essential (primary) hypertension: Secondary | ICD-10-CM | POA: Diagnosis not present

## 2021-12-19 DIAGNOSIS — A419 Sepsis, unspecified organism: Secondary | ICD-10-CM | POA: Diagnosis not present

## 2021-12-19 DIAGNOSIS — K5730 Diverticulosis of large intestine without perforation or abscess without bleeding: Secondary | ICD-10-CM | POA: Diagnosis not present

## 2021-12-19 DIAGNOSIS — L03311 Cellulitis of abdominal wall: Secondary | ICD-10-CM | POA: Insufficient documentation

## 2021-12-19 DIAGNOSIS — E1165 Type 2 diabetes mellitus with hyperglycemia: Secondary | ICD-10-CM | POA: Insufficient documentation

## 2021-12-19 DIAGNOSIS — K760 Fatty (change of) liver, not elsewhere classified: Secondary | ICD-10-CM | POA: Diagnosis not present

## 2021-12-19 DIAGNOSIS — I2510 Atherosclerotic heart disease of native coronary artery without angina pectoris: Secondary | ICD-10-CM | POA: Diagnosis not present

## 2021-12-19 DIAGNOSIS — N200 Calculus of kidney: Secondary | ICD-10-CM | POA: Diagnosis not present

## 2021-12-19 LAB — CBC WITH DIFFERENTIAL/PLATELET
Abs Immature Granulocytes: 0.13 10*3/uL — ABNORMAL HIGH (ref 0.00–0.07)
Basophils Absolute: 0.1 10*3/uL (ref 0.0–0.1)
Basophils Relative: 1 %
Eosinophils Absolute: 0.3 10*3/uL (ref 0.0–0.5)
Eosinophils Relative: 2 %
HCT: 39.5 % (ref 36.0–46.0)
Hemoglobin: 11.8 g/dL — ABNORMAL LOW (ref 12.0–15.0)
Immature Granulocytes: 1 %
Lymphocytes Relative: 31 %
Lymphs Abs: 3.9 10*3/uL (ref 0.7–4.0)
MCH: 23.7 pg — ABNORMAL LOW (ref 26.0–34.0)
MCHC: 29.9 g/dL — ABNORMAL LOW (ref 30.0–36.0)
MCV: 79.5 fL — ABNORMAL LOW (ref 80.0–100.0)
Monocytes Absolute: 0.6 10*3/uL (ref 0.1–1.0)
Monocytes Relative: 5 %
Neutro Abs: 7.6 10*3/uL (ref 1.7–7.7)
Neutrophils Relative %: 60 %
Platelets: 456 10*3/uL — ABNORMAL HIGH (ref 150–400)
RBC: 4.97 MIL/uL (ref 3.87–5.11)
RDW: 16.9 % — ABNORMAL HIGH (ref 11.5–15.5)
WBC: 12.5 10*3/uL — ABNORMAL HIGH (ref 4.0–10.5)
nRBC: 0 % (ref 0.0–0.2)

## 2021-12-19 MED ORDER — MORPHINE SULFATE (PF) 4 MG/ML IV SOLN
4.0000 mg | Freq: Once | INTRAVENOUS | Status: AC
  Administered 2021-12-19: 4 mg via INTRAVENOUS
  Filled 2021-12-19: qty 1

## 2021-12-19 MED ORDER — TICAGRELOR 90 MG PO TABS: 90.0000 mg | ORAL_TABLET | Freq: Two times a day (BID) | ORAL | 0 refills | Status: DC

## 2021-12-19 MED ORDER — LACTATED RINGERS IV BOLUS
1000.0000 mL | Freq: Once | INTRAVENOUS | Status: AC
Start: 2021-12-19 — End: 2021-12-20
  Administered 2021-12-19: 1000 mL via INTRAVENOUS

## 2021-12-19 NOTE — ED Triage Notes (Signed)
Pt has drainage from navel with redness.   Sx began today.   No fever.  No n/v/d  pt alert  speech clear.

## 2021-12-19 NOTE — ED Notes (Signed)
Pt st she cannot provide UA sample at this time- IVF infusing

## 2021-12-19 NOTE — ED Provider Notes (Signed)
[facility] Provider Note    Event Date/Time   First MD Initiated Contact with Patient 12/19/21 2303     (approximate)   History   navel drainage   HPI  Holly Garner is a 41 y.o. female who presents to the ED for evaluation of navel drainage   I review PCP visit from 2/16, morbid obesity BMI 51, DM, CAD s/p stenting, HTN, HLD.   Patient presents to the ED, accompanied by her husband, for evaluation of atraumatic drainage from her bellybutton alongside severe localized pain that is developed over the past couple hours.  Reports symptoms are started this afternoon.  She reports pain and atraumatic drainage coming from just her bellybutton.  Reports severe localized pain without radiation.  Reports tolerating p.o. intake and toileting at her baseline.  No vaginal bleeding or discharge.  No other sites of discharge from her skin.  Denies fever, nausea, emesis or systemic symptoms.   Physical Exam   Triage Vital Signs: ED Triage Vitals  Enc Vitals Group     BP 12/19/21 2057 128/80     Pulse Rate 12/19/21 2057 (!) 117     Resp 12/19/21 2057 20     Temp 12/19/21 2057 98.4 F (36.9 C)     Temp Source 12/19/21 2057 Oral     SpO2 12/19/21 2057 97 %     Weight 12/19/21 2057 (!) 312 lb (141.5 kg)     Height 12/19/21 2057 5\' 5"  (1.651 m)     Head Circumference --      Peak Flow --      Pain Score 12/19/21 2100 10     Pain Loc --      Pain Edu? --      Excl. in GC? --     Most recent vital signs: Vitals:   12/19/21 2346 12/20/21 0239  BP: 132/77 124/85  Pulse: 89 84  Resp: 19 19  Temp:  98.4 F (36.9 C)  SpO2: 98% 100%    General: Awake, no distress.  Morbidly obese.  Appears uncomfortable. CV:  Good peripheral perfusion.  Resp:  Normal effort.  Abd:  No distention.  Small patch of flat erythema without induration or fluctuance just superior to the navel.  About 2 x 4 cm in size.  Obvious thin discharge collecting within the bellybutton.   Using a 4 x 4 swab, collect some and it is bloody and purulent.  Able to express a small amount more out from deep within the navel with significant pain. Otherwise, her abdomen is benign with deep palpation through lateral, superior and inferior quadrants. MSK:  No deformity noted.  Neuro:  No focal deficits appreciated. Other:     ED Results / Procedures / Treatments   Labs (all labs ordered are listed, but only abnormal results are displayed) Labs Reviewed  CBC WITH DIFFERENTIAL/PLATELET - Abnormal; Notable for the following components:      Result Value   WBC 12.5 (*)    Hemoglobin 11.8 (*)    MCV 79.5 (*)    MCH 23.7 (*)    MCHC 29.9 (*)    RDW 16.9 (*)    Platelets 456 (*)    Abs Immature Granulocytes 0.13 (*)    All other components within normal limits  BASIC METABOLIC PANEL - Abnormal; Notable for the following components:   Glucose, Bld 208 (*)    All other components within normal limits  LACTIC ACID, PLASMA  LACTIC ACID, PLASMA  POC URINE PREG, ED    EKG   RADIOLOGY CT abdomen/pelvis reviewed by me with periumbilical subcutaneous inflammation without clear fluid collection or deeper intraperitoneal involvement  Official radiology report(s): CT ABDOMEN PELVIS WO CONTRAST  Result Date: 12/20/2021 CLINICAL DATA:  Drainage from the belly button. EXAM: CT ABDOMEN AND PELVIS WITHOUT CONTRAST TECHNIQUE: Multidetector CT imaging of the abdomen and pelvis was performed following the standard protocol without IV contrast. RADIATION DOSE REDUCTION: This exam was performed according to the departmental dose-optimization program which includes automated exposure control, adjustment of the mA and/or kV according to patient size and/or use of iterative reconstruction technique. COMPARISON:  None. FINDINGS: Evaluation of this exam is limited in the absence of intravenous contrast. Lower chest: The visualized lung bases are clear. There is coronary vascular calcification. No  intra-abdominal free air or free fluid. Hepatobiliary: Fatty liver. No intrahepatic biliary ductal dilatation. The gallbladder is unremarkable. Pancreas: Unremarkable. No pancreatic ductal dilatation or surrounding inflammatory changes. Spleen: Normal in size without focal abnormality. Adrenals/Urinary Tract: Punctate nonobstructing left renal inferior pole calculus. No hydronephrosis. The right kidney is unremarkable. The visualized ureters and urinary bladder appear unremarkable. Stomach/Bowel: Sigmoid diverticulosis without active inflammatory changes. There is no bowel obstruction or active inflammation. The appendix is normal. Vascular/Lymphatic: The abdominal aorta and IVC unremarkable. No portal gas. There is no adenopathy. Reproductive: The uterus is anteverted. An intrauterine device is noted. No adnexal masses. Other: There is induration and inflammatory changes of the umbilicus and periumbilical subcutaneous soft tissues which may represent cellulitis or incarcerated/strangulated umbilical fat. Clinical correlation is recommended. No drainable fluid collection or abscess. Musculoskeletal: No acute or significant osseous findings. IMPRESSION: 1. Periumbilical cellulitis or incarcerated/strangulated umbilical fat. No drainable fluid collection or abscess. 2. Sigmoid diverticulosis. No bowel obstruction. Normal appendix. 3. Fatty liver. 4. Punctate nonobstructing left renal inferior pole calculus. No hydronephrosis. Electronically Signed   By: Elgie Collard M.D.   On: 12/20/2021 01:24    PROCEDURES and INTERVENTIONS:  .Critical Care Performed by: Delton Prairie, MD Authorized by: Delton Prairie, MD   Critical care provider statement:    Critical care time (minutes):  30   Critical care time was exclusive of:  Separately billable procedures and treating other patients   Critical care was necessary to treat or prevent imminent or life-threatening deterioration of the following conditions:  Sepsis    Critical care was time spent personally by me on the following activities:  Development of treatment plan with patient or surrogate, evaluation of patient's response to treatment, examination of patient, ordering and review of laboratory studies, ordering and review of radiographic studies, ordering and performing treatments and interventions, pulse oximetry, re-evaluation of patient's condition and review of old charts  Medications  lactated ringers bolus 1,000 mL (0 mLs Intravenous Stopped 12/20/21 0100)  morphine (PF) 4 MG/ML injection 4 mg (4 mg Intravenous Given 12/19/21 2352)  sulfamethoxazole-trimethoprim (BACTRIM DS) 800-160 MG per tablet 1 tablet (1 tablet Oral Given 12/20/21 0227)  ketorolac (TORADOL) 30 MG/ML injection 15 mg (15 mg Intravenous Given 12/20/21 0239)     IMPRESSION / MDM / ASSESSMENT AND PLAN / ED COURSE  I reviewed the triage vital signs and the nursing notes.  Morbidly obese 41 year old female with multiple comorbidities presents to the ED with some purulent discharge from her umbilicus, likely superficial skin soft tissue infection ultimately suitable for trial of outpatient management.  She is tachycardic, improving with analgesia and IV fluids.  Exam demonstrates small patch of erythema without induration or  fluctuance just superior to her umbilicus, with some purulent discharge coming directly from the umbilicus.  Some localized superficial tenderness to palpation, but no peritoneal or signs of deeper intra-abdominal tenderness.  Otherwise looks well.  Blood work with mild leukocytosis, mild hyperglycemia without acidosis.  No lactic acidosis.  CT obtained and demonstrates superficial inflammation.  I discussed and considered admission for this patient considering meeting sepsis criteria, but she declines indicating that she would rather just go home.  She has capacity to make this decision and we discussed return precautions.  Started antibiotics in the ED and provided  prophylactic Diflucan due to her concerns of always getting yeast infection after antibiotics.  Clinical Course as of 12/20/21 7616  Tue Dec 19, 2021  2336 Discusssed plan of care w pt and husband, they're agreeable [DS]  Wed Dec 20, 2021  0230 Reassessed.  Feeling better.  Tachycardia has resolved.  We discussed that she does meet sepsis criteria, but she reports she would like to go home.  We discussed risks and benefits and she would like to pursue outpatient management, which I think is reasonable.  We discussed antibiotics, pain control and return precautions [DS]    Clinical Course User Index [DS] Delton Prairie, MD     FINAL CLINICAL IMPRESSION(S) / ED DIAGNOSES   Final diagnoses:  Cellulitis, abdominal wall     Rx / DC Orders   ED Discharge Orders          Ordered    sulfamethoxazole-trimethoprim (BACTRIM DS) 800-160 MG tablet  2 times daily        12/20/21 0233    fluconazole (DIFLUCAN) 100 MG tablet  Every other day        12/20/21 0737             Note:  This document was prepared using Dragon voice recognition software and may include unintentional dictation errors.   Delton Prairie, MD 12/20/21 [phone]

## 2021-12-19 NOTE — Addendum Note (Signed)
Addended by: Consuella Lose on: 12/19/2021 03:25 PM   Modules accepted: Orders

## 2021-12-20 ENCOUNTER — Emergency Department: Payer: Medicaid Other

## 2021-12-20 DIAGNOSIS — K5730 Diverticulosis of large intestine without perforation or abscess without bleeding: Secondary | ICD-10-CM | POA: Diagnosis not present

## 2021-12-20 DIAGNOSIS — K760 Fatty (change of) liver, not elsewhere classified: Secondary | ICD-10-CM | POA: Diagnosis not present

## 2021-12-20 DIAGNOSIS — N200 Calculus of kidney: Secondary | ICD-10-CM | POA: Diagnosis not present

## 2021-12-20 LAB — BASIC METABOLIC PANEL
Anion gap: 13 (ref 5–15)
BUN: 20 mg/dL (ref 6–20)
CO2: 27 mmol/L (ref 22–32)
Calcium: 9.3 mg/dL (ref 8.9–10.3)
Chloride: 102 mmol/L (ref 98–111)
Creatinine, Ser: 0.67 mg/dL (ref 0.44–1.00)
GFR, Estimated: >60 mL/min (ref >60)
Glucose, Bld: 208 mg/dL — ABNORMAL HIGH (ref 70–99)
Potassium: 3.7 mmol/L (ref 3.5–5.1)
Sodium: 142 mmol/L (ref 135–145)

## 2021-12-20 LAB — LACTIC ACID, PLASMA: Lactic Acid, Venous: 1.4 mmol/L (ref 0.5–1.9)

## 2021-12-20 LAB — POC URINE PREG, ED: Preg Test, Ur: NEGATIVE

## 2021-12-20 MED ORDER — SULFAMETHOXAZOLE-TRIMETHOPRIM 800-160 MG PO TABS
1.0000 | ORAL_TABLET | Freq: Once | ORAL | Status: AC
Start: 2021-12-20 — End: 2021-12-20
  Administered 2021-12-20: 1 via ORAL
  Filled 2021-12-20: qty 1

## 2021-12-20 MED ORDER — KETOROLAC TROMETHAMINE 30 MG/ML IJ SOLN
15.0000 mg | Freq: Once | INTRAMUSCULAR | Status: AC
  Administered 2021-12-20: 15 mg via INTRAVENOUS
  Filled 2021-12-20: qty 1

## 2021-12-20 MED ORDER — SULFAMETHOXAZOLE-TRIMETHOPRIM 800-160 MG PO TABS: 1.0000 | ORAL_TABLET | Freq: Two times a day (BID) | ORAL | 0 refills | Status: AC

## 2021-12-20 MED ORDER — FLUCONAZOLE 100 MG PO TABS: 100.0000 mg | ORAL_TABLET | ORAL | 0 refills | Status: AC

## 2021-12-20 NOTE — ED Notes (Signed)
Poct pregnancy Negative 

## 2021-12-20 NOTE — Discharge Instructions (Addendum)
Please take Tylenol and ibuprofen/Advil for your pain.  It is safe to take them together, or to alternate them every few hours.  Take up to 1000mg  of Tylenol at a time, up to 4 times per day.  Do not take more than 4000 mg of Tylenol in 24 hours.  For ibuprofen, take 400-600 mg, 4-5 times per day. ? ?Please use the Bactrim antibiotic twice daily for the next 7 days.  Please finish all 14 tablets even if you are feeling better. ? ?Take the Diflucan anti-- yeast infection medication on days 1, 4 and 7 of the antibiotic course. ? ?Return to the ED with any worsening symptoms.  Please continue all of your other medications. ?

## 2021-12-21 ENCOUNTER — Telehealth: Payer: Self-pay

## 2021-12-21 NOTE — Telephone Encounter (Signed)
Transition Care Management Follow-up Telephone Call ?Date of discharge and from where: 12/20/2021 from [facility] ?How have you been since you were released from the hospital? Patient stated that she is feeling better and the area of infection is feeling and looking better as well. Patient did not have any questions or concerns at this time.  ?Any questions or concerns? No ? ?Items Reviewed: ?Did the pt receive and understand the discharge instructions provided? Yes  ?Medications obtained and verified? Yes  ?Other? No  ?Any new allergies since your discharge? No  ?Dietary orders reviewed? No ?Do you have support at home? Yes  ? ?Functional Questionnaire: (I = Independent and D = Dependent) ?ADLs: I ? ?Bathing/Dressing- I ? ?Meal Prep- I ? ?Eating- I ? ?Maintaining continence- I ? ?Transferring/Ambulation- I ? ?Managing Meds- I ? ? ?Follow up appointments reviewed: ? ?PCP Hospital f/u appt confirmed? No   ?Specialist Hospital f/u appt confirmed? No   ?Are transportation arrangements needed? No  ?If their condition worsens, is the pt aware to call PCP or go to the Emergency Dept.? Yes ?Was the patient provided with contact information for the PCP's office or ED? Yes ?Was to pt encouraged to call back with questions or concerns? Yes ? ?

## 2022-01-05 ENCOUNTER — Other Ambulatory Visit: Payer: Self-pay | Admitting: Family Medicine

## 2022-01-05 DIAGNOSIS — E1169 Type 2 diabetes mellitus with other specified complication: Secondary | ICD-10-CM

## 2022-01-21 NOTE — Progress Notes (Signed)
Cardiology Office Note ? ?Date:  01/22/2022  ? ?ID:  Holly Garner, DOB 12/05/1980, MRN [id number] ? ?PCP:  Lesleigh Noe, MD  ? ?Chief Complaint  ?Patient presents with  ? 6 month follow up   ?  "Doing well." Medications reviewed by the patient verbally.   ? ? ?HPI:  ?41 year old female with a history of  ?CAD, PCI 04/2021 DES to occluded RCA,  ?hypertension,  ?hyperlipidemia,  ?Morbid obesity,  ?tobacco abuse, now vaps ?diabetes ?who presents for follow-up of CAD. ? ?LOV 10/22 ? ?Follow-up today reports she is doing well, denies chest pain concerning for angina ?Still smoking vapor cigarettes ? 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 ?Reports having rash on Atorvastatin and crestor and possibly metoprolol,  ?all of these medications were held ?Would prefer not to retry statin ? ?Recent lab work reviewed ?A1c 7.3 ?Total cholesterol 118, LDL 65 ? ?EKG personally reviewed by myself on todays visit ?Normal sinus rhythm rate 88 bpm no significant ST-T wave changes ? ?Past medical history reviewed ?Hospitalized in July 2022  with progressive chest pain and non-STEMI.  Found to have an occluded right coronary artery as well as moderate, nonobstructive LAD and circumflex disease.  EF 60-65%.  The RCA was treated with drug-eluting stent.  ?A1c of 10.8 ? ?ER 07/05/2021 ? mild episode of chest tightness, shortness of breath which lasted about 20 minutes and resolved without intervention.  Had recurrent symptoms that evening ?After 1 albuterol treatment in the ER, patient's symptoms  fully resolved, she is moving air no longer having any chest discomfort.  Most likely bronchospasms it was felt by the emergency room ? ? ?PMH:   has a past medical history of BMI 50.0-59.9, adult ([place]), CAD (coronary artery disease), Essential hypertension, High cholesterol, History of echocardiogram, History of  Papanicolaou smear of cervix (04/2011), 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 (08/18/2009), Tobacco abuse, and Type II diabetes mellitus ([place]). ? ?PSH:    ?Past Surgical History:  ?Procedure Laterality Date  ? COLPOSCOPY  09/28/2009  ? CORONARY STENT INTERVENTION N/A 05/04/2021  ? Procedure: CORONARY STENT INTERVENTION;  Surgeon: Wellington Hampshire, MD;  Location: [place] [facility];  Service: Cardiovascular;  Laterality: N/A;  ? LEFT HEART CATH AND CORONARY ANGIOGRAPHY N/A 05/04/2021  ? Procedure: LEFT HEART CATH AND CORONARY ANGIOGRAPHY;  Surgeon: Wellington Hampshire, MD;  Location: [place] [facility];  Service: Cardiovascular;  Laterality: N/A;  ? TONSILLECTOMY AND ADENOIDECTOMY    ? TYMPANOSTOMY TUBE PLACEMENT Bilateral   ? ? ?Current Outpatient Medications  ?Medication Sig Dispense Refill  ? ACCU-CHEK GUIDE test strip USE TO TEST BLOOD SUGARS UP TO FOUR TIMES DAILY 100 each 1  ? Accu-Chek Softclix Lancets lancets USE TO TEST BLOOD SUGARS UP TO FOUR TIMES DAILY 100 each 1  ? aspirin 81 MG chewable tablet Chew 1 tablet (81 mg total) by mouth daily. 30 tablet 0  ? blood glucose meter kit and supplies KIT Dispense based on patient and insurance preference. Use up to four times daily as directed. 1 each 0  ? Continuous Blood Gluc Sensor (FREESTYLE LIBRE 2 SENSOR) MISC 1 each by Does not apply route as directed. 2 each 11  ? ezetimibe (ZETIA) 10 MG tablet Take 1 tablet (10 mg total) by mouth daily. 90 tablet 3  ? hydrOXYzine (ATARAX) 10 MG tablet Take  1-2 tablets (10-20 mg total) by mouth 3 (three) times daily as needed for itching. 30 tablet 0  ? insulin glargine (LANTUS) 100 UNIT/ML injection Inject 0.14 mLs (14 Units total) into the skin daily. 12 mL 0  ? Insulin Pen Needle (PEN NEEDLES) 30G X 8 MM MISC 1 each by Does not apply route daily. 100 each 3  ? losartan (COZAAR) 25 MG tablet Take 1 tablet (25 mg total) by mouth daily. 90 tablet 3  ? metFORMIN (GLUCOPHAGE) 1000 MG tablet Take 1 tablet (1,000 mg total)  by mouth 2 (two) times daily with a meal. 180 tablet 3  ? nitroGLYCERIN (NITROSTAT) 0.4 MG SL tablet Place 1 tablet (0.4 mg total) under the tongue every 5 (five) minutes as needed for chest pain. 25 tablet 3  ? OZEMPIC, 0.25 OR 0.5 MG/DOSE, 2 MG/1.5ML SOPN INJECT 0.5 MG INTO THE SKIN ONCE A WEEK 1.5 mL 0  ? predniSONE (DELTASONE) 20 MG tablet TAKE BY MOUTH AS DIRECTED FOR SWEELING 10 tablet 0  ? PROAIR HFA 108 (90 Base) MCG/ACT inhaler Inhale 1-2 puffs into the lungs every 6 (six) hours as needed for wheezing or shortness of breath. 8.5 g 1  ? ticagrelor (BRILINTA) 90 MG TABS tablet Take 1 tablet (90 mg total) by mouth 2 (two) times daily. 180 tablet 0  ? EPINEPHrine (EPIPEN 2-PAK) 0.3 mg/0.3 mL IJ SOAJ injection Inject 0.3 mLs (0.3 mg total) into the muscle once. (Patient not taking: Reported on 01/22/2022) 2 Device 0  ? levocetirizine (XYZAL) 5 MG tablet Take 5 mg by mouth daily. (Patient not taking: Reported on 01/22/2022)    ? Semaglutide, 1 MG/DOSE, 4 MG/3ML SOPN Inject 1 mg as directed once a week. (Patient not taking: Reported on 10/12/2021) 3 mL 3  ? ?No current facility-administered medications for this visit.  ? ? ? ?Allergies:   Metoprolol, Statins, Atorvastatin, Rosuvastatin, and Contrast media [iodinated contrast media]  ? ?Social History:  The patient  reports that she quit smoking about 8 months ago. Her smoking use included cigarettes. She has a 10.00 pack-year smoking history. She has never used smokeless tobacco. She reports that she does not drink alcohol and does not use drugs.  ? ?Family History:   family history includes Breast cancer in her maternal grandmother; Diabetes in her father and paternal grandfather.  ? ? ?Review of Systems: ?Review of Systems  ?Constitutional: Negative.   ?HENT: Negative.    ?Respiratory: Negative.    ?Cardiovascular: Negative.   ?Gastrointestinal: Negative.   ?Musculoskeletal: Negative.   ?Neurological: Negative.   ?Psychiatric/Behavioral: Negative.    ?All other  systems reviewed and are negative. ? ? ?PHYSICAL EXAM: ?VS:  BP 110/78 (BP Location: Left Arm, Patient Position: Sitting, Cuff Size: Large)   Pulse 88   Ht _0  (1.651 m)   Wt (!) 138 kg   SpO2 97%   BMI 50.63 kg/m?  , BMI Body mass index is 50.63 kg/m?Marland Kitchen ?Constitutional:  oriented to person, place, and time. No distress.  ?HENT:  ?Head: Grossly normal ?Eyes:  no discharge. No scleral icterus.  ?Neck: No JVD, no carotid bruits  ?Cardiovascular: Regular rate and rhythm, no murmurs appreciated ?Pulmonary/Chest: Clear to auscultation bilaterally, no wheezes or rails ?Abdominal: Soft.  no distension.  no tenderness.  ?Musculoskeletal: Normal range of motion ?Neurological:  normal muscle tone. Coordination normal. No atrophy ?Skin: Skin warm and dry ?Psychiatric: normal affect, pleasant ? ? ?Recent Labs: ?06/29/2021: TSH 1.50 ?08/22/2021: ALT 14 ?12/19/2021: BUN 20; Creatinine,  Ser 0.67; Hemoglobin 11.8; Platelets 456; Potassium 3.7; Sodium 142  ? ? ?Lipid Panel ?Lab Results  ?Component Value Date  ? CHOL 118 08/22/2021  ? HDL 35.60 (L) 08/22/2021  ? [place] 65 08/22/2021  ? TRIG 89.0 08/22/2021  ? ?  ? ?Wt Readings from Last 3 Encounters:  ?01/22/22 (!) 138 kg  ?12/19/21 (!) 141.5 kg  ?12/07/21 (!) 141.3 kg  ?  ? ? ?ASSESSMENT AND PLAN: ? ?Problem List Items Addressed This Visit   ? ? Type 2 diabetes mellitus with other specified complication ([place])  ? Morbid obesity ([place])  ? HTN (hypertension)  ? Tobacco abuse  ? ?Other Visit Diagnoses   ? ? Coronary artery disease of native artery of native heart with stable angina pectoris ([place])    -  Primary  ? Hyperlipidemia LDL goal <70      ? ?  ?Shortness of breath/chest tightness/bronchospasm ?Previously treated with albuterol inhaler ?History of smoking, ?Still smoking vape cigarettes ?Metoprolol is off the list (possibly contributing to a rash) ? ?Hyperlipidemia ?Numbers at goal on Lipitor and Zetia ?She is now off Lipitor out of concern for a rash ?Reports having similar  symptoms on Crestor, rash ?Recommend she try Repatha ? ?Coronary disease with stable angina ?Stent placed to distal RCA ?Some residual disease noted on catheterization ?Currently with no symptoms of angina. No furthe

## 2022-01-22 ENCOUNTER — Telehealth: Payer: Self-pay

## 2022-01-22 ENCOUNTER — Ambulatory Visit: Payer: Medicaid Other | Admitting: Cardiovascular Disease

## 2022-01-22 ENCOUNTER — Telehealth: Payer: Self-pay | Admitting: *Deleted

## 2022-01-22 ENCOUNTER — Encounter: Payer: Self-pay | Admitting: Cardiovascular Disease

## 2022-01-22 VITALS — BP 110/78 | HR 88 | Ht 65.0 in | Wt 304.2 lb

## 2022-01-22 DIAGNOSIS — E1169 Type 2 diabetes mellitus with other specified complication: Secondary | ICD-10-CM | POA: Diagnosis not present

## 2022-01-22 DIAGNOSIS — I25118 Atherosclerotic heart disease of native coronary artery with other forms of angina pectoris: Secondary | ICD-10-CM

## 2022-01-22 DIAGNOSIS — Z720 Tobacco use: Secondary | ICD-10-CM

## 2022-01-22 DIAGNOSIS — I10 Essential (primary) hypertension: Secondary | ICD-10-CM | POA: Diagnosis not present

## 2022-01-22 DIAGNOSIS — E785 Hyperlipidemia, unspecified: Secondary | ICD-10-CM

## 2022-01-22 MED ORDER — REPATHA SURECLICK 140 MG/ML ~~LOC~~ SOAJ: 140.0000 mg | SUBCUTANEOUS | 3 refills | Status: DC

## 2022-01-22 NOTE — Telephone Encounter (Signed)
Prior Authorization initiated by [email or web address] KEY: [id number] PA completed and office notes/lipid panel results attached as requested. ? ?RESPONSE: Wait for Determination ?Please wait for [employer] to return a determination. ?

## 2022-01-22 NOTE — Telephone Encounter (Signed)
PA required for Repatha 140 mg/ml inj. ?PA has been submitted via covermymeds. ?Awaiting Approval. ? ?Your information has been sent to [place] [employer]. ?

## 2022-01-22 NOTE — Patient Instructions (Addendum)
Medication Instructions:  ?START taking Repatha 140 shot every 2 weeks ? ?If you need a refill on your cardiac medications before your next appointment, please call your pharmacy.  ? ?Lab work: ?No new labs needed ? ?Testing/Procedures: ?No new testing needed ? ?Follow-Up: ?At [facility], you and your health needs are our priority.  As part of our continuing mission to provide you with exceptional heart care, we have created designated Provider Care Teams.  These Care Teams include your primary Cardiologist (physician) and Advanced Practice Providers (APPs -  Physician Assistants and Nurse Practitioners) who all work together to provide you with the care you need, when you need it. ? ?You will need a follow up appointment in 6 months ? ?Providers on your designated Care Team:   ?Nicolasa Ducking, NP ?Eula Listen, PA-C ?Cadence Fransico Michael, PA-C ? ?COVID-19 Vaccine Information can be found at: [email or web address] For questions related to vaccine distribution or appointments, please email [email or web address] or call [phone].  ? ?

## 2022-01-23 NOTE — Telephone Encounter (Signed)
Pt has been denied. ?PA appeal placed in nurses bin for review. ? ?Message from Plan ?PA Case: [id number], Status: Denied. Notification: Completed. ?

## 2022-01-23 NOTE — Telephone Encounter (Signed)
Called [employer] to inquire as to why Repatha was denied. Reasoning for denial did not reflect the information sent to the insurance plan. Baseline LDL and current LDL were both included in the initial request. Spoke with Leilani Able. who concurred that they in fact did receive the information they deemed missing from the initial request so she reached out to a pharmacist for further explanation. She then said that she got a response staing the current LDL was WNL. I informed her that as stated in the notes this was because she had been on a statin but we noted that she is not tolerating statins due to myalgias which is the reason for requesting Repatha. She was unable to speak with a pharmacist in a timely manner therefore I told her I would call back.  ?Upon calling back I spoke with Deshantoine who transferred me to [facility]. It was then determined that the best option at this point would be to speak directly with a pharmacist for a peer to peer review. I was unable to speak with a pharmacist at the time of the call so I left a voicemail message and was told our office would receive a call back within 72 hours. Ref# [id number]  ?

## 2022-01-25 ENCOUNTER — Encounter: Payer: Self-pay | Admitting: Cardiovascular Disease

## 2022-01-25 NOTE — Telephone Encounter (Signed)
Called [employer] to see if I could speak with pharmacist for peer-to-peer.  ? ?Spoke with Zack Seal who stated that she would request an urgent peer-to-peer review and that our office should receive a call back by tomorrow.  ?

## 2022-01-26 ENCOUNTER — Telehealth: Payer: Self-pay | Admitting: Cardiovascular Disease

## 2022-01-26 NOTE — Telephone Encounter (Signed)
Incoming inbasket received:  ? ?"Requesting pt's last 2-3 visit notes as well as any labs that may have been done to be faxed at the attached number. Please advise" ? ?Fax number provided for RX peer-to-peer [phone] ? ?Requested notes and labs printed and faxed to [employer].  ?

## 2022-01-26 NOTE — Telephone Encounter (Signed)
This information documented under previous phone encounter.  ? ?Closing this encounter.  ?

## 2022-01-26 NOTE — Telephone Encounter (Signed)
Requesting pt's last 2-3 visit notes as well as any labs that may have been done to be faxed at the attached number. Please advise ?

## 2022-01-30 NOTE — Telephone Encounter (Signed)
Pharmacy PA request #[id number] ? ?Per fax received from [employer], [employer] has approved Repatha 140 mg/mL [employer] quantity/billable untis of 2 from 01/29/2022 through 01/29/2023. Approved J code: [id number] ?

## 2022-02-02 ENCOUNTER — Other Ambulatory Visit: Payer: Self-pay

## 2022-02-02 MED ORDER — OZEMPIC (0.25 OR 0.5 MG/DOSE) 2 MG/3ML ~~LOC~~ SOPN: 0.5000 mg | PEN_INJECTOR | SUBCUTANEOUS | 0 refills | Status: DC

## 2022-02-24 ENCOUNTER — Other Ambulatory Visit: Payer: Self-pay | Admitting: Family Medicine

## 2022-02-26 ENCOUNTER — Other Ambulatory Visit: Payer: Self-pay | Admitting: Family Medicine

## 2022-02-26 ENCOUNTER — Encounter: Payer: Self-pay | Admitting: Family Medicine

## 2022-02-26 DIAGNOSIS — L509 Urticaria, unspecified: Secondary | ICD-10-CM

## 2022-02-26 DIAGNOSIS — R609 Edema, unspecified: Secondary | ICD-10-CM

## 2022-03-06 ENCOUNTER — Ambulatory Visit: Payer: Medicaid Other | Admitting: Family Medicine

## 2022-03-06 ENCOUNTER — Encounter: Payer: Self-pay | Admitting: Family Medicine

## 2022-03-06 VITALS — BP 122/82 | HR 86 | Temp 98.2°F | Ht 65.0 in | Wt 309.0 lb

## 2022-03-06 DIAGNOSIS — L509 Urticaria, unspecified: Secondary | ICD-10-CM | POA: Diagnosis not present

## 2022-03-06 DIAGNOSIS — E1169 Type 2 diabetes mellitus with other specified complication: Secondary | ICD-10-CM

## 2022-03-06 DIAGNOSIS — L501 Idiopathic urticaria: Secondary | ICD-10-CM

## 2022-03-06 DIAGNOSIS — R609 Edema, unspecified: Secondary | ICD-10-CM

## 2022-03-06 DIAGNOSIS — T783XXA Angioneurotic edema, initial encounter: Secondary | ICD-10-CM | POA: Insufficient documentation

## 2022-03-06 LAB — POCT GLYCOSYLATED HEMOGLOBIN (HGB A1C): Hemoglobin A1C: 7 % — AB (ref 4.0–5.6)

## 2022-03-06 MED ORDER — FUROSEMIDE 20 MG PO TABS: 20.0000 mg | ORAL_TABLET | Freq: Every day | ORAL | 0 refills | Status: DC

## 2022-03-06 MED ORDER — PREDNISONE 20 MG PO TABS: ORAL_TABLET | ORAL | 0 refills | Status: DC

## 2022-03-06 NOTE — Assessment & Plan Note (Signed)
Modest weight loss with Ozempic.  Patient does have follow-up with bariatric surgeon and is excited about this.  Discussed that this could push harder diabetes into remission and do think that it would be a reasonable thought for this patient ?

## 2022-03-06 NOTE — Assessment & Plan Note (Signed)
Lab Results  ?Component Value Date  ? HGBA1C 7.0 (A) 03/06/2022  ? ?Improved control, no longer taking Lantus.  Continue metformin and Ozempic.  Update in approximately 1 month for dose increase of Ozempic. ?

## 2022-03-06 NOTE — Assessment & Plan Note (Signed)
She has seen allergy and rheumatology without a clear etiology for this.  She notes ankle and knee swelling is the worst.  Discussed trial of a water pill, Lasix sent in to see if this will help with symptoms and relieve pain.   Discussed risks of chronic prednisone and ideal situation that she would not need this regularly.  Discussed would give 10 for severe symptoms.  Message to allergy as well as rheumatology to see if either provider has additional thoughts for work-up and treatment of her symptoms that would be safer than prednisone. ? ?

## 2022-03-06 NOTE — Progress Notes (Signed)
? ?Subjective:  ? ?  ?Holly Garner is a 41 y.o. female presenting for Diabetes ?  ? ? ?HPI ? ?#Diabetes ?Currently taking ozempic 1 mg, metformin 1000 mg, stopped lantus  ?Using medications without difficulties: Yes ?Hypoglycemic episodes:No  ?Hyperglycemic episodes:No  ?Feet problems: swelling ?Blood Sugars averaging: 99-130 ?Last HgbA1c:  ?Lab Results  ?Component Value Date  ? HGBA1C 7.0 (A) 03/06/2022  ? ? ?Diabetes Health Maintenance Due:  ?  ?Diabetes Health Maintenance Due  ?Topic Date Due  ? OPHTHALMOLOGY EXAM  08/05/2020  ? FOOT EXAM  08/22/2022  ? HEMOGLOBIN A1C  09/06/2022  ? ?#Swelling ?- feet have been swelling over the last week ?- back is "swollen" and she can deal with this ?- when feet and knees swell she cannot walk ?- hands and back of head swell ?- previous treatment: allergy medications ?- saw an allergist, ENT, Rheumatologist, ER ?- Saw Dr. Dimple Garner in 06/2021 ?- will take 20 mg prednisone at onset with improvement in swelling ?- saw Allergist Holly Stanford, MD ?- no change with daily xyzal ? ? ?Review of Systems ? ? ?Social History  ? ?Tobacco Use  ?Smoking Status Former  ? Packs/day: 1.00  ? Years: 10.00  ? Pack years: 10.00  ? Types: Cigarettes  ? Quit date: 05/03/2021  ? Years since quitting: 0.8  ?Smokeless Tobacco Never  ? ? ? ?   ?Objective:  ?  ?BP Readings from Last 3 Encounters:  ?03/06/22 122/82  ?01/22/22 110/78  ?12/20/21 124/85  ? ?Wt Readings from Last 3 Encounters:  ?03/06/22 (!) 309 lb (140.2 kg)  ?01/22/22 (!) 304 lb 4 oz (138 kg)  ?12/19/21 (!) 312 lb (141.5 kg)  ? ? ?BP 122/82 (BP Location: Left Arm, Patient Position: Sitting, Cuff Size: Normal)   Pulse 86   Temp 98.2 ?F (36.8 ?C) (Temporal)   Ht 5\' 5"  (1.651 m)   Wt (!) 309 lb (140.2 kg)   SpO2 98%   BMI 51.42 kg/m?  ? ? ?Physical Exam ?Constitutional:   ?   General: She is not in acute distress. ?   Appearance: She is well-developed. She is not diaphoretic.  ?HENT:  ?   Right Ear: External ear normal.  ?   Left Ear:  External ear normal.  ?   Nose: Nose normal.  ?Eyes:  ?   Conjunctiva/sclera: Conjunctivae normal.  ?Cardiovascular:  ?   Rate and Rhythm: Normal rate and regular rhythm.  ?   Heart sounds: No murmur heard. ?Pulmonary:  ?   Effort: Pulmonary effort is normal. No respiratory distress.  ?   Breath sounds: Normal breath sounds. No wheezing.  ?Musculoskeletal:  ?   Cervical back: Neck supple.  ?   Right lower leg: No edema.  ?   Left lower leg: No edema.  ?Skin: ?   General: Skin is warm and dry.  ?   Capillary Refill: Capillary refill takes less than 2 seconds.  ?   Comments: Some mild swelling in the lower back and a few scattered erythematous patches  ?Neurological:  ?   Mental Status: She is alert. Mental status is at baseline.  ?Psychiatric:     ?   Mood and Affect: Mood normal.     ?   Behavior: Behavior normal.  ? ? ? ? ? ?   ?Assessment & Plan:  ? ?Problem List Items Addressed This Visit   ? ?  ? Endocrine  ? Type 2 diabetes mellitus with other specified  complication (HCC) - Primary  ?  Lab Results  ?Component Value Date  ? HGBA1C 7.0 (A) 03/06/2022  ?Improved control, no longer taking Lantus.  Continue metformin and Ozempic.  Update in approximately 1 month for dose increase of Ozempic. ?  ?  ? Relevant Orders  ? POCT glycosylated hemoglobin (Hb A1C) (Completed)  ?  ? Musculoskeletal and Integument  ? Hives  ? Relevant Medications  ? predniSONE (DELTASONE) 20 MG tablet  ? Other Relevant Orders  ? Ambulatory referral to Dermatology  ? Idiopathic urticaria  ?  Reviewed visit with Dr. Barnetta Garner with allergy 09/2021.  Prick testing was done, per patient this was negative although results not available.  Reviewing note allergy seems to indicate that more could be done with antihistamines for prevention and I am inclined to agree.  Though patient does note that allergy medicines do not work, it is unclear how she is utilizing these and what her expectations are for them.  She has Xyzal on her list but I do not think she  takes it daily.  Called the allergy office to see if she felt a follow-up visit would be helpful in getting to the bottom of patient's symptoms and hopefully moving away from intermittent steroid use for treatment.  We will also refer to dermatology to see if they have any additional thoughts. ? ?  ?  ?  ? Other  ? Swelling  ?  She has seen allergy and rheumatology without a clear etiology for this.  She notes ankle and knee swelling is the worst.  Discussed trial of a water pill, Lasix sent in to see if this will help with symptoms and relieve pain.   Discussed risks of chronic prednisone and ideal situation that she would not need this regularly.  Discussed would give 10 for severe symptoms.  Message to allergy as well as rheumatology to see if either provider has additional thoughts for work-up and treatment of her symptoms that would be safer than prednisone. ? ?  ?  ? Relevant Medications  ? predniSONE (DELTASONE) 20 MG tablet  ? furosemide (LASIX) 20 MG tablet  ? Other Relevant Orders  ? Ambulatory referral to Dermatology  ? Morbid obesity (HCC)  ?  Modest weight loss with Ozempic.  Patient does have follow-up with bariatric surgeon and is excited about this.  Discussed that this could push harder diabetes into remission and do think that it would be a reasonable thought for this patient ? ?  ?  ? ?I spent 48 minutes with pt , obtaining history, examining, reviewing chart, documenting encounter and discussing the above plan of care. ? ? ?Return in about 3 months (around 06/06/2022) for diabetes and weight. ? ?Holly Child, MD ? ? ? ?

## 2022-03-06 NOTE — Assessment & Plan Note (Addendum)
Reviewed visit with Dr. Barnetta Chapel with allergy 09/2021.  Prick testing was done, per patient this was negative although results not available.  Reviewing note allergy seems to indicate that more could be done with antihistamines for prevention and I am inclined to agree.  Though patient does note that allergy medicines do not work, it is unclear how she is utilizing these and what her expectations are for them.  She has Xyzal on her list but I do not think she takes it daily.  Called the allergy office to see if she felt a follow-up visit would be helpful in getting to the bottom of patient's symptoms and hopefully moving away from intermittent steroid use for treatment.  We will also refer to dermatology to see if they have any additional thoughts. ?

## 2022-03-06 NOTE — Patient Instructions (Signed)
Diabetes ?- mychart in 3-4 weeks for dose in ozempic ?- if tolerating ?- return 3 months ?- continue metformin ? ?Swelling/hives ?- I will message rheumatology and allergy  ?- will follow-up with you ?- dermatology -- call if you don't hear in 2-3 weeks ?- prednisone for emergency up to 10 per year ?- try lasix for swelling -- update if helping or ineffective ?

## 2022-03-20 ENCOUNTER — Encounter: Payer: Self-pay | Admitting: Family Medicine

## 2022-03-20 ENCOUNTER — Encounter: Payer: Self-pay | Admitting: Cardiovascular Disease

## 2022-03-20 DIAGNOSIS — E119 Type 2 diabetes mellitus without complications: Secondary | ICD-10-CM | POA: Diagnosis not present

## 2022-03-20 DIAGNOSIS — I10 Essential (primary) hypertension: Secondary | ICD-10-CM | POA: Diagnosis not present

## 2022-03-20 DIAGNOSIS — Z955 Presence of coronary angioplasty implant and graft: Secondary | ICD-10-CM | POA: Diagnosis not present

## 2022-03-21 ENCOUNTER — Encounter: Payer: Self-pay | Admitting: Family Medicine

## 2022-03-21 DIAGNOSIS — Z713 Dietary counseling and surveillance: Secondary | ICD-10-CM | POA: Diagnosis not present

## 2022-03-21 DIAGNOSIS — Z6841 Body Mass Index (BMI) 40.0 and over, adult: Secondary | ICD-10-CM | POA: Diagnosis not present

## 2022-03-23 DIAGNOSIS — Z0279 Encounter for issue of other medical certificate: Secondary | ICD-10-CM

## 2022-03-26 DIAGNOSIS — Z7189 Other specified counseling: Secondary | ICD-10-CM | POA: Diagnosis not present

## 2022-03-26 DIAGNOSIS — Z6841 Body Mass Index (BMI) 40.0 and over, adult: Secondary | ICD-10-CM | POA: Diagnosis not present

## 2022-03-26 DIAGNOSIS — F54 Psychological and behavioral factors associated with disorders or diseases classified elsewhere: Secondary | ICD-10-CM | POA: Diagnosis not present

## 2022-03-27 ENCOUNTER — Telehealth: Payer: Self-pay | Admitting: Cardiovascular Disease

## 2022-03-27 NOTE — Telephone Encounter (Signed)
   Pre-operative Risk Assessment    Patient Name: MAJESTI GAMBRELL  DOB: [birth year] MRN: [id number]      Request for Surgical Clearance    Procedure:   Bariatric Surgery   Date of Surgery:  Clearance TBD                                 Surgeon:  not indicated  Surgeon's Group or Practice Name:  [facility] Phone number:  [phone] Fax number:  [phone]   Type of Clearance Requested:   - Medical    Type of Anesthesia:  Not Indicated   Additional requests/questions:  Please advise surgeon/provider what medications should be held.  SignedNorman Herrlich   03/27/2022, 2:42 PM

## 2022-03-27 NOTE — Telephone Encounter (Signed)
Routing to MA

## 2022-03-28 NOTE — Telephone Encounter (Signed)
    Name: Holly Garner  DOB: 07/23/1981  MRN: [id number]  Primary Cardiologist: Julien Nordmann, MD  41 year old female with a history of CAD a DES-RCA in July 2022, EF 60-65%, hypertension, hyperlipidemia, tobacco use, type 2 diabetes, and morbid obesity.   S/p PCI/DES-dRCA 05/04/2021.  On DAPT with aspirin and Brilinta. She was last seen in the office on 01/22/2022 and was stable from a cardiac standpoint.  Received surgical clearance request for upcoming bariatric surgery. Dr. Mariah Milling, please advise on holding Brilinta prior to this procedure. Please route your response to P CV DIV PREOP.   Thank you.    Joylene Grapes, NP 03/28/2022, 4:36 PM [facility] [address] [place], [place] [address]

## 2022-03-29 NOTE — Telephone Encounter (Signed)
    Name: Holly Garner  DOB: October 08, 1981  MRN: [id number]  Primary Cardiologist: Ida Rogue, MD   Preoperative team, please contact this patient and set up a phone call appointment for further preoperative risk assessment. Please obtain consent and complete medication review. Thank you for your help.  I confirm that guidance regarding antiplatelet and oral anticoagulation therapy has been completed and, if necessary, noted below.  Per Dr. Rockey Situ, primary cardiologist, patient may hold Brilinta for 7 days prior to surgery.  Please resume Brilinta as soon as possible postprocedure, at the discretion of the surgeon.  Patient could should continue aspirin throughout the perioperative period.  Lenna Sciara, NP 03/29/2022, 9:39 AM [facility] [address] [place], [place] [address]

## 2022-03-30 ENCOUNTER — Telehealth: Payer: Self-pay | Admitting: *Deleted

## 2022-03-30 NOTE — Telephone Encounter (Signed)
Pt agreeable to plan of care for tele pre op appt 04/03/22 @ 10 am. Med rec and consent are done.    Patient Consent for Virtual Visit        Holly Garner has provided verbal consent on 03/30/2022 for a virtual visit (video or telephone).   CONSENT FOR VIRTUAL VISIT FOR:  Holly Garner  By participating in this virtual visit I agree to the following:  I hereby voluntarily request, consent and authorize [facility] and its employed or contracted physicians, physician assistants, nurse practitioners or other licensed health care professionals (the Practitioner), to provide me with telemedicine health care services (the "Services") as deemed necessary by the treating Practitioner. I acknowledge and consent to receive the Services by the Practitioner via telemedicine. I understand that the telemedicine visit will involve communicating with the Practitioner through live audiovisual communication technology and the disclosure of certain medical information by electronic transmission. I acknowledge that I have been given the opportunity to request an in-person assessment or other available alternative prior to the telemedicine visit and am voluntarily participating in the telemedicine visit.  I understand that I have the right to withhold or withdraw my consent to the use of telemedicine in the course of my care at any time, without affecting my right to future care or treatment, and that the Practitioner or I may terminate the telemedicine visit at any time. I understand that I have the right to inspect all information obtained and/or recorded in the course of the telemedicine visit and may receive copies of available information for a reasonable fee.  I understand that some of the potential risks of receiving the Services via telemedicine include:  Delay or interruption in medical evaluation due to technological equipment failure or disruption; Information transmitted may not be sufficient  (e.g. poor resolution of images) to allow for appropriate medical decision making by the Practitioner; and/or  In rare instances, security protocols could fail, causing a breach of personal health information.  Furthermore, I acknowledge that it is my responsibility to provide information about my medical history, conditions and care that is complete and accurate to the best of my ability. I acknowledge that Practitioner's advice, recommendations, and/or decision may be based on factors not within their control, such as incomplete or inaccurate data provided by me or distortions of diagnostic images or specimens that may result from electronic transmissions. I understand that the practice of medicine is not an exact science and that Practitioner makes no warranties or guarantees regarding treatment outcomes. I acknowledge that a copy of this consent can be made available to me via my patient portal [facility] MyChart), or I can request a printed copy by calling the office of [facility].    I understand that my insurance will be billed for this visit.   I have read or had this consent read to me. I understand the contents of this consent, which adequately explains the benefits and risks of the Services being provided via telemedicine.  I have been provided ample opportunity to ask questions regarding this consent and the Services and have had my questions answered to my satisfaction. I give my informed consent for the services to be provided through the use of telemedicine in my medical care

## 2022-03-30 NOTE — Telephone Encounter (Signed)
Pt agreeable to plan of care for tele pre op appt 04/03/22 @ 10 am. Med rec and consent are done.

## 2022-04-01 ENCOUNTER — Encounter: Payer: Self-pay | Admitting: Family Medicine

## 2022-04-02 DIAGNOSIS — M7989 Other specified soft tissue disorders: Secondary | ICD-10-CM | POA: Diagnosis not present

## 2022-04-02 DIAGNOSIS — Z713 Dietary counseling and surveillance: Secondary | ICD-10-CM | POA: Diagnosis not present

## 2022-04-02 DIAGNOSIS — Z6841 Body Mass Index (BMI) 40.0 and over, adult: Secondary | ICD-10-CM | POA: Diagnosis not present

## 2022-04-03 ENCOUNTER — Encounter: Payer: Self-pay | Admitting: Nurse Practitioner

## 2022-04-03 ENCOUNTER — Ambulatory Visit (INDEPENDENT_AMBULATORY_CARE_PROVIDER_SITE_OTHER): Payer: Medicaid Other | Admitting: Nurse Practitioner

## 2022-04-03 DIAGNOSIS — Z01810 Encounter for preprocedural cardiovascular examination: Secondary | ICD-10-CM

## 2022-04-03 NOTE — Progress Notes (Signed)
Virtual Visit via Telephone Note   Because of Holly Garner's co-morbid illnesses, she is at least at moderate risk for complications without adequate follow up.  This format is felt to be most appropriate for this patient at this time.  The patient did not have access to video technology/had technical difficulties with video requiring transitioning to audio format only (telephone).  All issues noted in this document were discussed and addressed.  No physical exam could be performed with this format.  Please refer to the patient's chart for her consent to telehealth for [facility].  Evaluation Performed:  Preoperative cardiovascular risk assessment _____________   Date:  04/03/2022   Patient ID:  Holly Garner, DOB 03/16/1981, MRN [id number] Patient Location:  Home Provider location:   Office  Primary Care Provider:  Lesleigh Noe, MD Primary Cardiologist:  Holly Rogue, MD  Chief Complaint / Patient Profile   41 y.o. y/o female with a h/o CAD s/p DES to RCA July 2022, hypertension, hyperlipidemia, tobacco abuse, type 2 diabetes, and morbid obesity who is pending bariatric surgery and presents today for telephonic preoperative cardiovascular risk assessment.  Past Medical History    Past Medical History:  Diagnosis Date   BMI 50.0-59.9, adult ([place])    CAD (coronary artery disease)    a. 04/2021 NSTEMI/PCI: LM nl, LAD 64m 30d, D1 nl, LCX 64mOM1 min irregs, RCA 20p, 30p/m, 100d (2.5x26 Resolute Onyx DES).   Essential hypertension    High cholesterol    History of echocardiogram    a. 04/2021 Echo: EF 60-65%. No rwma. Nl RV fxn.   History of Papanicolaou smear of cervix 04/2011   normal per pt.   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   hgsil   Tobacco abuse    [place]   Past Surgical History:  Procedure Laterality Date   COLPOSCOPY  09/28/2009   CORONARY STENT INTERVENTION N/A 05/04/2021   Procedure: CORONARY STENT  INTERVENTION;  Surgeon: ArWellington HampshireMD;  [facility];  Service: Cardiovascular;  Laterality: N/A;   LEFT HEART CATH AND CORONARY ANGIOGRAPHY N/A 05/04/2021   Procedure: LEFT HEART CATH AND CORONARY ANGIOGRAPHY;  Surgeon: ArWellington HampshireMD;  [facility];  Service: Cardiovascular;  Laterality: N/A;   TONSILLECTOMY AND ADENOIDECTOMY     TYMPANOSTOMY TUBE PLACEMENT Bilateral     Allergies  Allergies  Allergen Reactions   Metoprolol Hives   Statins Hives, Itching and Other (See Comments)    Other reaction(s): Unknown    Atorvastatin Hives and Itching   Rosuvastatin Hives and Itching   Contrast Media [Iodinated Contrast Media] Itching    History of Present Illness    Holly Garner a [age].o. female who presents via audio/video conferencing for a telehealth visit today.  Pt was last seen in cardiology clinic on 01/22/22 by Dr. GoRockey Garner At that time Holly ALANIZas doing well.  The patient is now pending procedure as outlined above. Since her last visit, she denies chest pain, shortness of breath, lower extremity edema, fatigue, palpitations, melena, hematuria, hemoptysis, diaphoresis, weakness, presyncope, syncope, orthopnea, and PND.  Home Medications    Prior to Admission medications   Medication Sig Start Date End Date Taking? Authorizing Provider  ACCU-CHEK GUIDE test strip USE TO TEST BLOOD SUGARS UP TO FOUR TIMES DAILY 05/31/21   CaMichela PitcherNP  Accu-Chek Softclix Lancets lancets USE TO TEST  BLOOD SUGARS UP TO FOUR TIMES DAILY 05/31/21   Holly Pitcher, NP  aspirin 81 MG chewable tablet Chew 1 tablet (81 mg total) by mouth daily. 05/06/21   Holly Hones, MD  blood glucose meter kit and supplies KIT Dispense based on patient and insurance preference. Use up to four times daily as directed. 05/05/21   Holly Hones, MD  Continuous Blood Gluc Sensor (FREESTYLE LIBRE 2 SENSOR) MISC 1 each by Does not apply route as directed. 08/16/21    Holly Pitcher, NP  EPINEPHrine (EPIPEN 2-PAK) 0.3 mg/0.3 mL IJ SOAJ injection Inject 0.3 mLs (0.3 mg total) into the muscle once. 01/07/16   Gouru, Illene Silver, MD  Evolocumab (REPATHA SURECLICK) 277 MG/ML SOAJ Inject 140 mg into the skin every 14 (fourteen) days. 01/22/22   Holly Merritts, MD  furosemide (LASIX) 20 MG tablet Take 1 tablet (20 mg total) by mouth daily. 03/06/22   Holly Noe, MD  hydrOXYzine (ATARAX) 10 MG tablet Take 1-2 tablets (10-20 mg total) by mouth 3 (three) times daily as needed for itching. 11/02/21   Holly Koch, NP  Insulin Pen Needle (PEN NEEDLES) 30G X 8 MM MISC 1 each by Does not apply route daily. 05/31/21   Holly Pitcher, NP  levocetirizine (XYZAL) 5 MG tablet Take 5 mg by mouth daily. 09/27/21   [provider]  losartan (COZAAR) 25 MG tablet Take 1 tablet (25 mg total) by mouth daily. 07/24/21   Holly Merritts, MD  metFORMIN (GLUCOPHAGE) 1000 MG tablet Take 1 tablet (1,000 mg total) by mouth 2 (two) times daily with a meal. 06/29/21   Holly Pitcher, NP  nitroGLYCERIN (NITROSTAT) 0.4 MG SL tablet Place 1 tablet (0.4 mg total) under the tongue every 5 (five) minutes as needed for chest pain. 07/24/21   Holly Merritts, MD  predniSONE (DELTASONE) 20 MG tablet TAKE BY MOUTH AS DIRECTED FOR SWEELING 03/06/22   Holly Noe, MD  PROAIR HFA 108 [phone] Base) MCG/ACT inhaler Inhale 1-2 puffs into the lungs every 6 (six) hours as needed for wheezing or shortness of breath. 07/24/21   Holly Merritts, MD  Semaglutide, 1 MG/DOSE, 4 MG/3ML SOPN Inject 1 mg as directed once a week. 02/26/22   Holly Noe, MD  ticagrelor (BRILINTA) 90 MG TABS tablet Take 1 tablet (90 mg total) by mouth 2 (two) times daily. 12/19/21   Holly Noe, MD    Physical Exam    Vital Signs:  Holly Garner does not have vital signs available for review today.  Given telephonic nature of communication, physical exam is limited. AAOx3. NAD. Normal affect.  Speech and respirations  are unlabored.  Accessory Clinical Findings    None  Assessment & Plan    1.  Preoperative Cardiovascular Risk Assessment: She is doing well from a cardiac perspective and may proceed without further testing. According to the Revised Cardiac Risk Index (RCRI), her Perioperative Risk of Major Cardiac Event is (%): 0.9. Her Functional Capacity in METs is: 7.59 according to the Duke Activity Status Index (DASI).  Per Dr. Rockey Garner, she may hold Brilinta for 7 days prior to procedure and resume Brilinta as soon as possible postprocedure. Ideally aspirin should be continued without interruption, however if the bleeding risk is too great, aspirin may be held for 7 days prior to surgery. Please resume aspirin post operatively when it is felt to be safe from a bleeding standpoint.    A copy of  this note will be routed to requesting surgeon.  Time:   Today, I have spent 10 minutes with the patient with telehealth technology discussing medical history, symptoms, and management plan.     Emmaline Life, NP-C    04/03/2022, 10:02 AM [place] [address] Office [phone] Fax [phone]

## 2022-04-08 NOTE — Progress Notes (Unsigned)
Office Visit Note  Patient: Holly Garner             Date of Birth: February 19, 1981           MRN: [id number]             PCP: Lesleigh Noe, MD Referring: Lesleigh Noe, MD Visit Date: 04/09/2022   Subjective:  No chief complaint on file.   History of Present Illness: Holly Garner is a 41 y.o. female here for follow up for ongoing joint swelling and skin rashes with elevated inflammatory markers. Since out visit last year symptoms have continued. She saw allergy clinic and is taking antihistamine medication no specific targeted therapy or other underlying process identified. ***   Previous HPI 06/29/21 Holly Garner is a 41 y.o. female here for joint pains with hand swelling and elevated CRP. 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 She had a history of former smoking, obesity, and diabetes but no prior known cardiovascular disease. Symptoms started since around 2016 and initial emergency department visit after a puncture wound to her hand while gardening from a [place] thistle thorn.  However since that time she has had continued ongoing intermittent skin rashes in multiple areas that are red slightly raised wheals typically lasting a few days at a time but very responsive to oral prednisone.  These have occurred throughout her whole body often seem to be spontaneous she also describes rashes being provoked with pressure to specific areas such as receiving a firm massage resulting in back rash or lying on a particular extremity provoking an arm or leg rash hours after the original pressure.  There is frequently some swelling and joint pains near the involved areas.  Her worst joint pains occur at the knees and feet these can be very severe and when swollen limit her mobility.  There has been no improvement to OTC oral antihistamines.  She had one episode of mouth or throat swelling requiring emergency department visit that was  treated with steroids with resolution.  She has had some type of food allergen sensitivity testing and a lot of lab work over several years but denies any skin biopsy or specific allergist evaluation.    Labs reviewed 04/2021 ANA neg RF neg ESR 29 hsCRP 21.3   No Rheumatology ROS completed.   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 04/2021 NSTEMI/PCI: LM nl, LAD 7m 30d, D1 nl, LCX 668mOM1 min irregs, RCA 20p, 30p/m, 100d (2.5x26 Resolute Onyx DES).   Essential hypertension    High cholesterol    History of echocardiogram    a. 04/2021 Echo: EF 60-65%. No rwma. Nl RV fxn.   History of Papanicolaou smear of cervix 04/2011   normal per pt.   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  hgsil   Tobacco abuse    Type II diabetes mellitus ([place])     Family History  Problem Relation Age of Onset   Diabetes Father    Breast cancer Maternal Grandmother        late years   Diabetes Paternal Grandfather    Past Surgical History:  Procedure Laterality Date   COLPOSCOPY  09/28/2009   CORONARY STENT INTERVENTION N/A 05/04/2021   Procedure: CORONARY STENT INTERVENTION;  Surgeon: Wellington Hampshire, MD;  Location: [facility];  Service: Cardiovascular;  Laterality: N/A;   LEFT HEART CATH AND CORONARY ANGIOGRAPHY N/A 05/04/2021   Procedure: LEFT HEART CATH AND CORONARY  ANGIOGRAPHY;  Surgeon: Wellington Hampshire, MD;  Location: [facility];  Service: Cardiovascular;  Laterality: N/A;   TONSILLECTOMY AND ADENOIDECTOMY     TYMPANOSTOMY TUBE PLACEMENT Bilateral    Social History   Social History Narrative   Not on file   Immunization History  Administered Date(s) Administered   Influenza,inj,Quad PF,6+ Mos 07/07/2019   PFIZER(Purple Top)SARS-COV-2 Vaccination 11/22/2020, 12/19/2020     Objective: Vital Signs: There were no vitals taken for this visit.   Physical Exam   Musculoskeletal Exam: ***  CDAI Exam: CDAI Score: -- Patient Global: --; Provider Global: -- Swollen: --; Tender: -- Joint Exam 04/09/2022   No joint exam has been documented for this visit   There is currently no information documented on the homunculus. Go to the Rheumatology activity and complete the homunculus joint exam.  Investigation: No additional findings.  Imaging: No results found.  Recent Labs: Lab Results  Component Value Date   WBC 12.5 (H) 12/19/2021   HGB 11.8 (L) 12/19/2021   PLT 456 (H) 12/19/2021   NA 142 12/19/2021   K 3.7 12/19/2021   CL 102 12/19/2021   CO2 27 12/19/2021   GLUCOSE 208 (H) 12/19/2021   BUN 20 12/19/2021   CREATININE 0.67 12/19/2021   BILITOT 0.4 08/22/2021   ALKPHOS 113 08/22/2021   AST 13 08/22/2021   ALT 14 08/22/2021   PROT 6.3 08/22/2021   ALBUMIN 3.7 08/22/2021   CALCIUM 9.3 12/19/2021   GFRAA >60 10/18/2017    Speciality Comments: No specialty comments available.  Procedures:  No procedures performed Allergies: Metoprolol, Statins, Atorvastatin, Rosuvastatin, and Contrast media [iodinated contrast media]   Assessment / Plan:     Visit Diagnoses: No diagnosis found.  ***  Orders: No orders of the defined types were placed in this encounter.  No orders of the defined types were placed in this encounter.    Follow-Up Instructions: No follow-ups on file.   Collier Salina, MD  Note - This  record has been created using [employer].  Chart creation errors have been sought, but may not always  have been located. Such creation errors do not reflect on  the standard of medical care.

## 2022-04-09 ENCOUNTER — Ambulatory Visit: Payer: Medicaid Other | Admitting: Internal Medicine

## 2022-04-09 ENCOUNTER — Encounter: Payer: Self-pay | Admitting: Cardiovascular Disease

## 2022-04-09 ENCOUNTER — Encounter: Payer: Self-pay | Admitting: Internal Medicine

## 2022-04-09 VITALS — BP 138/80 | HR 98 | Resp 15 | Ht 65.0 in | Wt 313.0 lb

## 2022-04-09 DIAGNOSIS — R609 Edema, unspecified: Secondary | ICD-10-CM

## 2022-04-09 DIAGNOSIS — L509 Urticaria, unspecified: Secondary | ICD-10-CM | POA: Diagnosis not present

## 2022-04-09 DIAGNOSIS — L501 Idiopathic urticaria: Secondary | ICD-10-CM

## 2022-04-09 MED ORDER — HYDROXYCHLOROQUINE SULFATE 200 MG PO TABS: 400.0000 mg | ORAL_TABLET | Freq: Every day | ORAL | 2 refills | Status: DC

## 2022-04-18 DIAGNOSIS — E119 Type 2 diabetes mellitus without complications: Secondary | ICD-10-CM | POA: Diagnosis not present

## 2022-04-18 DIAGNOSIS — I10 Essential (primary) hypertension: Secondary | ICD-10-CM | POA: Diagnosis not present

## 2022-04-18 DIAGNOSIS — Z955 Presence of coronary angioplasty implant and graft: Secondary | ICD-10-CM | POA: Diagnosis not present

## 2022-04-18 DIAGNOSIS — Z6841 Body Mass Index (BMI) 40.0 and over, adult: Secondary | ICD-10-CM | POA: Diagnosis not present

## 2022-04-19 DIAGNOSIS — I10 Essential (primary) hypertension: Secondary | ICD-10-CM | POA: Diagnosis not present

## 2022-04-19 DIAGNOSIS — E119 Type 2 diabetes mellitus without complications: Secondary | ICD-10-CM | POA: Diagnosis not present

## 2022-04-19 DIAGNOSIS — Z955 Presence of coronary angioplasty implant and graft: Secondary | ICD-10-CM | POA: Diagnosis not present

## 2022-04-19 DIAGNOSIS — E785 Hyperlipidemia, unspecified: Secondary | ICD-10-CM | POA: Diagnosis not present

## 2022-04-21 HISTORY — PX: GASTRIC BYPASS: SHX52

## 2022-04-25 DIAGNOSIS — E119 Type 2 diabetes mellitus without complications: Secondary | ICD-10-CM | POA: Diagnosis not present

## 2022-04-25 DIAGNOSIS — Z6841 Body Mass Index (BMI) 40.0 and over, adult: Secondary | ICD-10-CM | POA: Diagnosis not present

## 2022-04-25 DIAGNOSIS — Z955 Presence of coronary angioplasty implant and graft: Secondary | ICD-10-CM | POA: Diagnosis not present

## 2022-04-25 DIAGNOSIS — I10 Essential (primary) hypertension: Secondary | ICD-10-CM | POA: Diagnosis not present

## 2022-04-26 ENCOUNTER — Encounter: Payer: Self-pay | Admitting: Family Medicine

## 2022-04-26 MED ORDER — OZEMPIC (1 MG/DOSE) 4 MG/3ML ~~LOC~~ SOPN: 1.0000 mg | PEN_INJECTOR | SUBCUTANEOUS | 1 refills | Status: DC

## 2022-04-27 DIAGNOSIS — E119 Type 2 diabetes mellitus without complications: Secondary | ICD-10-CM | POA: Diagnosis not present

## 2022-04-27 DIAGNOSIS — Z01818 Encounter for other preprocedural examination: Secondary | ICD-10-CM | POA: Diagnosis not present

## 2022-04-27 DIAGNOSIS — E785 Hyperlipidemia, unspecified: Secondary | ICD-10-CM | POA: Diagnosis not present

## 2022-04-27 DIAGNOSIS — Z955 Presence of coronary angioplasty implant and graft: Secondary | ICD-10-CM | POA: Diagnosis not present

## 2022-04-27 DIAGNOSIS — I10 Essential (primary) hypertension: Secondary | ICD-10-CM | POA: Diagnosis not present

## 2022-05-16 DIAGNOSIS — I10 Essential (primary) hypertension: Secondary | ICD-10-CM | POA: Diagnosis not present

## 2022-05-16 DIAGNOSIS — I252 Old myocardial infarction: Secondary | ICD-10-CM | POA: Diagnosis not present

## 2022-05-16 DIAGNOSIS — Z6841 Body Mass Index (BMI) 40.0 and over, adult: Secondary | ICD-10-CM | POA: Diagnosis not present

## 2022-05-16 DIAGNOSIS — E785 Hyperlipidemia, unspecified: Secondary | ICD-10-CM | POA: Diagnosis not present

## 2022-05-16 DIAGNOSIS — E119 Type 2 diabetes mellitus without complications: Secondary | ICD-10-CM | POA: Diagnosis not present

## 2022-05-16 DIAGNOSIS — I2510 Atherosclerotic heart disease of native coronary artery without angina pectoris: Secondary | ICD-10-CM | POA: Diagnosis not present

## 2022-05-16 DIAGNOSIS — Z955 Presence of coronary angioplasty implant and graft: Secondary | ICD-10-CM | POA: Diagnosis not present

## 2022-05-19 ENCOUNTER — Other Ambulatory Visit: Payer: Self-pay | Admitting: Family Medicine

## 2022-05-21 ENCOUNTER — Ambulatory Visit: Payer: Medicaid Other | Admitting: Family Medicine

## 2022-05-21 VITALS — BP 110/70 | HR 95 | Temp 97.7°F | Wt 284.5 lb

## 2022-05-21 DIAGNOSIS — E7800 Pure hypercholesterolemia, unspecified: Secondary | ICD-10-CM

## 2022-05-21 DIAGNOSIS — Z903 Acquired absence of stomach [part of]: Secondary | ICD-10-CM | POA: Insufficient documentation

## 2022-05-21 DIAGNOSIS — I10 Essential (primary) hypertension: Secondary | ICD-10-CM | POA: Diagnosis not present

## 2022-05-21 DIAGNOSIS — I252 Old myocardial infarction: Secondary | ICD-10-CM | POA: Diagnosis not present

## 2022-05-21 DIAGNOSIS — E1169 Type 2 diabetes mellitus with other specified complication: Secondary | ICD-10-CM

## 2022-05-21 MED ORDER — TICAGRELOR 90 MG PO TABS: 90.0000 mg | ORAL_TABLET | Freq: Two times a day (BID) | ORAL | 1 refills | Status: DC

## 2022-05-21 NOTE — Assessment & Plan Note (Signed)
-

## 2022-05-21 NOTE — Patient Instructions (Addendum)
Please check your blood pressure 2-4 times a week.   To check your blood pressure 1) Sit in a quiet and relaxed place for 5 minutes 2) Make sure your feet are flat on the ground 3) Consider checking first thing in the morning   Normal blood pressure is less than 140/90 Ideally you blood pressure should be around 120/80   

## 2022-05-21 NOTE — Assessment & Plan Note (Signed)
She reports low blood sugars at home.  Continue to hold metformin and Ozempic.  Follow-up in 4 to 6 weeks.

## 2022-05-21 NOTE — Assessment & Plan Note (Signed)
Doing well post op. She has stopped medications. Some local irritation from staples, no sign of infection. Cont with surgery f/u.

## 2022-05-21 NOTE — Assessment & Plan Note (Signed)
Blood pressure low normal today.  Advise getting home cuff so she can monitor blood pressure at home.  Continue to hold Lasix, losartan.

## 2022-05-21 NOTE — Progress Notes (Signed)
Subjective:     Holly Garner is a 41 y.o. female presenting for Hospitalization Follow-up (Gastric bypass)     HPI  #Gastric sleeve  - preop diet lost 29 lbs - lost 12 lbs since surgery - will see surgeon on Wednesday - did have some high BP in the hospital - pain is managable now - worse with movement and walking - will feel tight  #HTN - has been holding her bp medication continuously  - has not been checking at home  #hx of NSTEMI - still on bilenta and asa   Starting multivitamin and vit D  Review of Systems  7/26-7/27/2023: Hospital - Gastric sleeve - with [facility] - needs BP and DM f/u  Social History   Tobacco Use  Smoking Status Former   Packs/day: 1.00   Years: 10.00   Total pack years: 10.00   Types: Cigarettes   Quit date: 05/03/2021   Years since quitting: 1.0  Smokeless Tobacco Never        Objective:    BP Readings from Last 3 Encounters:  05/21/22 110/70  04/09/22 138/80  03/06/22 122/82   Wt Readings from Last 3 Encounters:  05/21/22 284 lb 8 oz (129 kg)  04/09/22 (!) 313 lb (142 kg)  03/06/22 (!) 309 lb (140.2 kg)    BP 110/70   Pulse 95   Temp 97.7 F (36.5 C) (Temporal)   Wt 284 lb 8 oz (129 kg)   SpO2 97%   BMI 47.34 kg/m    Physical Exam Constitutional:      General: She is not in acute distress.    Appearance: She is well-developed. She is not diaphoretic.  HENT:     Right Ear: External ear normal.     Left Ear: External ear normal.     Nose: Nose normal.  Eyes:     Conjunctiva/sclera: Conjunctivae normal.  Cardiovascular:     Rate and Rhythm: Normal rate and regular rhythm.     Heart sounds: No murmur heard. Pulmonary:     Effort: Pulmonary effort is normal. No respiratory distress.     Breath sounds: Normal breath sounds. No wheezing.  Abdominal:     General: Abdomen is flat.     Palpations: Abdomen is soft.     Comments: Several incisions with staples in place and mild erythema near the staple  site.   Musculoskeletal:     Cervical back: Neck supple.  Skin:    General: Skin is warm and dry.     Capillary Refill: Capillary refill takes less than 2 seconds.  Neurological:     Mental Status: She is alert. Mental status is at baseline.  Psychiatric:        Mood and Affect: Mood normal.        Behavior: Behavior normal.           Assessment & Plan:   Problem List Items Addressed This Visit       Cardiovascular and Mediastinum   HTN (hypertension)    Blood pressure low normal today.  Advise getting home cuff so she can monitor blood pressure at home.  Continue to hold Lasix, losartan.        Endocrine   Type 2 diabetes mellitus with other specified complication (HCC)    She reports low blood sugars at home.  Continue to hold metformin and Ozempic.  Follow-up in 4 to 6 weeks.        Other   Hx  of non-ST elevation myocardial infarction (NSTEMI) - Primary    Continue aspirin and Brilinta.      Relevant Medications   ticagrelor (BRILINTA) 90 MG TABS tablet   Pure hypercholesterolemia   Relevant Medications   ticagrelor (BRILINTA) 90 MG TABS tablet   S/P gastric sleeve procedure    Doing well post op. She has stopped medications. Some local irritation from staples, no sign of infection. Cont with surgery f/u.         Return in about 6 weeks (around 07/02/2022) for weight loss surgery - Diabetes.  Lynnda Child, MD

## 2022-07-02 ENCOUNTER — Ambulatory Visit: Payer: Medicaid Other | Admitting: Family Medicine

## 2022-07-02 VITALS — BP 112/70 | HR 67 | Temp 97.3°F | Ht 65.0 in | Wt 269.0 lb

## 2022-07-02 DIAGNOSIS — E1169 Type 2 diabetes mellitus with other specified complication: Secondary | ICD-10-CM

## 2022-07-02 DIAGNOSIS — I252 Old myocardial infarction: Secondary | ICD-10-CM | POA: Diagnosis not present

## 2022-07-02 DIAGNOSIS — I10 Essential (primary) hypertension: Secondary | ICD-10-CM

## 2022-07-02 DIAGNOSIS — E119 Type 2 diabetes mellitus without complications: Secondary | ICD-10-CM | POA: Diagnosis not present

## 2022-07-02 DIAGNOSIS — Z903 Acquired absence of stomach [part of]: Secondary | ICD-10-CM

## 2022-07-02 LAB — POCT GLYCOSYLATED HEMOGLOBIN (HGB A1C): Hemoglobin A1C: 5.6 % (ref 4.0–5.6)

## 2022-07-02 NOTE — Assessment & Plan Note (Signed)
Continue Brilinta and aspirin.  She has stopped her Repatha, she has a follow-up with cardiology to discuss this further at her next visit.

## 2022-07-02 NOTE — Assessment & Plan Note (Signed)
Has lost weight status post gastric bypass.  Blood pressure and diabetes are now controlled off of all medications.

## 2022-07-02 NOTE — Assessment & Plan Note (Signed)
Controlled, off of medications.  Continue to remain off of losartan and Lasix.

## 2022-07-02 NOTE — Assessment & Plan Note (Signed)
Weight is down 40 pounds since presurgery.  She is feeling great, taking her vitamins.  Following up with the surgeon.  Continue healthy diet and regular exercise.

## 2022-07-02 NOTE — Assessment & Plan Note (Signed)
Lab Results  Component Value Date   HGBA1C 5.6 07/02/2022   She is approximately 6 weeks out from gastric bypass surgery, with complete remission of her diabetes.  Remain off of diabetic medications will remove from list.  Continue healthy diet and regular exercise.

## 2022-07-02 NOTE — Progress Notes (Signed)
Subjective:     Holly Garner is a 41 y.o. female presenting for Follow-up (DM )     HPI  #s/p gastric sleeve - eating more normal food - some days of low energy - walking 2 miles every morning - is taking her post sleeve vitamins   Lab Results  Component Value Date   HGBA1C 5.6 07/02/2022   Stopped medication for DM since the surgery   HTN - has not been checking  - feeling well   Eating smaller portions Eating every 2 hours   Review of Systems  05/21/2022: Clinic - s/p gastric sleeve - holding metformin and HTN meds with bp controlled. still on clear liquid diet  Social History   Tobacco Use  Smoking Status Former   Packs/day: 1.00   Years: 10.00   Total pack years: 10.00   Types: Cigarettes   Quit date: 05/03/2021   Years since quitting: 1.1  Smokeless Tobacco Never        Objective:    BP Readings from Last 3 Encounters:  07/02/22 112/70  05/21/22 110/70  04/09/22 138/80   Wt Readings from Last 3 Encounters:  07/02/22 269 lb (122 kg)  05/21/22 284 lb 8 oz (129 kg)  04/09/22 (!) 313 lb (142 kg)    BP 112/70   Pulse 67   Temp (!) 97.3 F (36.3 C) (Temporal)   Ht 5\' 5"  (1.651 m)   Wt 269 lb (122 kg)   SpO2 99%   BMI 44.76 kg/m    Physical Exam Constitutional:      General: She is not in acute distress.    Appearance: She is well-developed. She is not diaphoretic.  HENT:     Right Ear: External ear normal.     Left Ear: External ear normal.     Nose: Nose normal.  Eyes:     Conjunctiva/sclera: Conjunctivae normal.  Cardiovascular:     Rate and Rhythm: Normal rate and regular rhythm.  Pulmonary:     Effort: Pulmonary effort is normal. No respiratory distress.     Breath sounds: Normal breath sounds. No wheezing.  Musculoskeletal:     Cervical back: Neck supple.  Skin:    General: Skin is warm and dry.     Capillary Refill: Capillary refill takes less than 2 seconds.  Neurological:     Mental Status: She is alert. Mental  status is at baseline.  Psychiatric:        Mood and Affect: Mood normal.        Behavior: Behavior normal.           Assessment & Plan:   Problem List Items Addressed This Visit       Cardiovascular and Mediastinum   HTN (hypertension)    Controlled, off of medications.  Continue to remain off of losartan and Lasix.        Endocrine   Diet-controlled diabetes mellitus (HCC) - Primary    Lab Results  Component Value Date   HGBA1C 5.6 07/02/2022  She is approximately 6 weeks out from gastric bypass surgery, with complete remission of her diabetes.  Remain off of diabetic medications will remove from list.  Continue healthy diet and regular exercise.        Other   Morbid obesity (HCC)    Has lost weight status post gastric bypass.  Blood pressure and diabetes are now controlled off of all medications.      Hx of non-ST elevation myocardial infarction (  NSTEMI)    Continue Brilinta and aspirin.  She has stopped her Repatha, she has a follow-up with cardiology to discuss this further at her next visit.      S/P gastric sleeve procedure    Weight is down 40 pounds since presurgery.  She is feeling great, taking her vitamins.  Following up with the surgeon.  Continue healthy diet and regular exercise.        Return in about 3 months (around 10/01/2022) for [facility] with Dr. Alphonsus Sias with Wyatt Mage .  Lynnda Child, MD

## 2022-07-02 NOTE — Patient Instructions (Signed)
YAY

## 2022-07-14 ENCOUNTER — Encounter: Payer: Self-pay | Admitting: Family Medicine

## 2022-07-16 NOTE — Telephone Encounter (Signed)
Routing to MA to call Dr. Roosvelt Harps office to ask about prednisone.

## 2022-07-19 NOTE — Progress Notes (Addendum)
Cardiology Office Note  Date:  07/20/2022   ID:  Holly Garner, DOB 1981-02-07, MRN [id number]  PCP:  Lesleigh Noe, MD   Chief Complaint  Patient presents with   6 month follow up     HPI:  41 year old female with a history of  CAD, PCI 04/2021 DES to occluded RCA,  hypertension,  hyperlipidemia,  Morbid obesity,  tobacco abuse quit 2022, quit vaps 2023 diabetes who presents for follow-up of CAD.  Last seen in clinic by myself April 2023  Gastric bypass surgery July 26th 2023 Lost 50 pounds in the past several months since surgery Eats every 2 hrs, 4 to 5 bites Off repatha Reports that she is walking daily around [place] that all of her medications were stopped by the gastric bypass surgeon  Lab work reviewed A1C down from 10.8 one-year ago Total cholesterol 118 and LDL 65 in November 2022 on repatha  Denies chest pain concerning for angina  EKG personally reviewed by myself on todays visit Normal sinus rhythm rate 69 bpm no significant ST or T wave changes  Prior history statin myalgia Past medical history reviewed Hospitalized in July 2022  with progressive chest pain and non-STEMI.  Found to have an occluded right coronary artery as well as moderate, nonobstructive LAD and circumflex disease.  EF 60-65%.  The RCA was treated with drug-eluting stent.  A1c of 10.8  ER 07/05/2021  mild episode of chest tightness, shortness of breath which lasted about 20 minutes and resolved without intervention.  Had recurrent symptoms that evening After 1 albuterol treatment in the ER, patient's symptoms  fully resolved, she is moving air no longer having any chest discomfort.  Most likely bronchospasms it was felt by the emergency room   PMH:   has a past medical history of BMI 50.0-59.9, adult ([place]), CAD (coronary artery disease), Essential hypertension, High cholesterol, History of echocardiogram, History of Papanicolaou smear of cervix (04/2011), Pap smear  abnormality of cervix with HGSIL (08/18/2009), Tobacco abuse, and Type II diabetes mellitus ([place]).  PSH:    Past Surgical History:  Procedure Laterality Date   COLPOSCOPY  09/28/2009   CORONARY STENT INTERVENTION N/A 05/04/2021   Procedure: CORONARY STENT INTERVENTION;  Surgeon: Wellington Hampshire, MD;  Location: [facility];  Service: Cardiovascular;  Laterality: N/A;   LEFT HEART CATH AND CORONARY ANGIOGRAPHY N/A 05/04/2021   Procedure: LEFT HEART CATH AND CORONARY ANGIOGRAPHY;  Surgeon: Wellington Hampshire, MD;  Location: [facility];  Service: Cardiovascular;  Laterality: N/A;   TONSILLECTOMY AND ADENOIDECTOMY     TYMPANOSTOMY TUBE PLACEMENT Bilateral     Current Outpatient Medications  Medication Sig Dispense Refill   aspirin 81 MG chewable tablet Chew 1 tablet (81 mg total) by mouth daily. 30 tablet 0   ticagrelor (BRILINTA) 90 MG TABS tablet Take 1 tablet (90 mg total) by mouth 2 (two) times daily. 180 tablet 1   ursodiol (ACTIGALL) 300 MG capsule Take 300 mg by mouth 2 (two) times daily.     ACCU-CHEK GUIDE test strip USE TO TEST BLOOD SUGARS UP TO FOUR TIMES DAILY (Patient not taking: Reported on 07/20/2022) 100 each 1   Accu-Chek Softclix Lancets lancets USE TO TEST BLOOD SUGARS UP TO FOUR TIMES DAILY (Patient not taking: Reported on 07/20/2022) 100 each 1   blood glucose meter kit and supplies KIT Dispense based on patient and insurance preference. Use up to four times daily as directed. (Patient not taking: Reported  on 07/20/2022) 1 each 0   EPINEPHrine (EPIPEN 2-PAK) 0.3 mg/0.3 mL IJ SOAJ injection Inject 0.3 mLs (0.3 mg total) into the muscle once. (Patient not taking: Reported on 07/20/2022) 2 Device 0   nitroGLYCERIN (NITROSTAT) 0.4 MG SL tablet Place 1 tablet (0.4 mg total) under the tongue every 5 (five) minutes as needed for chest pain. (Patient not taking: Reported on 07/20/2022) 25 tablet 3   PROAIR HFA 108 (90 Base) MCG/ACT inhaler Inhale 1-2 puffs into the lungs  every 6 (six) hours as needed for wheezing or shortness of breath. (Patient not taking: Reported on 07/20/2022) 8.5 g 1   No current facility-administered medications for this visit.     Allergies:   Metoprolol, Omeprazole, Statins, Atorvastatin, Rosuvastatin, Contrast media [iodinated contrast media], Other, and Prednisone   Social History:  The patient  reports that she quit smoking about 14 months ago. Her smoking use included cigarettes. She has a 10.00 pack-year smoking history. She has never used smokeless tobacco. She reports that she does not drink alcohol and does not use drugs.   Family History:   family history includes Breast cancer in her maternal grandmother; Diabetes in her father and paternal grandfather.    Review of Systems: Review of Systems  Constitutional: Negative.   HENT: Negative.    Respiratory: Negative.    Cardiovascular: Negative.   Gastrointestinal: Negative.   Musculoskeletal: Negative.   Neurological: Negative.   Psychiatric/Behavioral: Negative.    All other systems reviewed and are negative.   PHYSICAL EXAM: VS:  BP 120/80 (BP Location: Left Arm, Patient Position: Sitting, Cuff Size: Large)   Pulse 69   Ht _0  (1.651 m)   Wt 262 lb 8 oz (119.1 kg)   SpO2 98%   BMI 43.68 kg/m  , BMI Body mass index is 43.68 kg/m. Constitutional:  oriented to person, place, and time. No distress.  HENT:  Head: Grossly normal Eyes:  no discharge. No scleral icterus.  Neck: No JVD, no carotid bruits  Cardiovascular: Regular rate and rhythm, no murmurs appreciated Pulmonary/Chest: Clear to auscultation bilaterally, no wheezes or rails Abdominal: Soft.  no distension.  no tenderness.  Musculoskeletal: Normal range of motion Neurological:  normal muscle tone. Coordination normal. No atrophy Skin: Skin warm and dry Psychiatric: normal affect, pleasant  Recent Labs: 08/22/2021: ALT 14 12/19/2021: BUN 20; Creatinine, Ser 0.67; Hemoglobin 11.8; Platelets 456;  Potassium 3.7; Sodium 142    Lipid Panel Lab Results  Component Value Date   CHOL 118 08/22/2021   HDL 35.60 (L) 08/22/2021   LDLCALC 65 08/22/2021   TRIG 89.0 08/22/2021      Wt Readings from Last 3 Encounters:  07/20/22 262 lb 8 oz (119.1 kg)  07/02/22 269 lb (122 kg)  05/21/22 284 lb 8 oz (129 kg)      ASSESSMENT AND PLAN:  Problem List Items Addressed This Visit       Cardiology Problems   HTN (hypertension)     Other   Morbid obesity ([place])   Tobacco abuse   Other Visit Diagnoses     Coronary artery disease of native artery of native heart with stable angina pectoris ([place])    -  Primary   Type 2 diabetes mellitus with other specified complication, without long-term current use of insulin (HCC)       Hyperlipidemia LDL goal <70         Shortness of breath/chest tightness/bronchospasm Previously treated with albuterol inhaler History of smoking, She has stopped  smoking cigarettes and is no longer smoking vapor cigarettes Denies shortness of breath on exertion, no chest pain on exertion  Hyperlipidemia Her medications were held by surgical team will need to restart lipid management, Repatha She prefers to have routine lipid panel done with primary care,  goal LDL less than 55  Coronary disease with stable angina Stent placed to distal RCA Some residual disease noted on catheterization Currently with no symptoms of angina. No further workup at this time.  Will likely need to restart lipid management medication We will decrease Brilinta down to 60 mg twice daily, stay on aspirin  Morbid obesity/gastric bypass surgery Doing well following surgery 2 months ago, has lost 50 pounds per her estimates No significant complications, blood pressure stable    Total encounter time more than 30 minutes  Greater than 50% was spent in counseling and coordination of care with the patient    Signed, Esmond Plants, M.D., Ph.D. [place],  [place]

## 2022-07-20 ENCOUNTER — Ambulatory Visit: Payer: Medicaid Other | Attending: Cardiovascular Disease | Admitting: Cardiovascular Disease

## 2022-07-20 ENCOUNTER — Encounter: Payer: Self-pay | Admitting: Cardiovascular Disease

## 2022-07-20 VITALS — BP 120/80 | HR 69 | Ht 65.0 in | Wt 262.5 lb

## 2022-07-20 DIAGNOSIS — E7800 Pure hypercholesterolemia, unspecified: Secondary | ICD-10-CM

## 2022-07-20 DIAGNOSIS — I10 Essential (primary) hypertension: Secondary | ICD-10-CM

## 2022-07-20 DIAGNOSIS — E785 Hyperlipidemia, unspecified: Secondary | ICD-10-CM | POA: Diagnosis not present

## 2022-07-20 DIAGNOSIS — I25118 Atherosclerotic heart disease of native coronary artery with other forms of angina pectoris: Secondary | ICD-10-CM | POA: Diagnosis not present

## 2022-07-20 DIAGNOSIS — E1169 Type 2 diabetes mellitus with other specified complication: Secondary | ICD-10-CM | POA: Diagnosis not present

## 2022-07-20 DIAGNOSIS — Z720 Tobacco use: Secondary | ICD-10-CM

## 2022-07-20 DIAGNOSIS — I252 Old myocardial infarction: Secondary | ICD-10-CM

## 2022-07-20 MED ORDER — TICAGRELOR 60 MG PO TABS: 60.0000 mg | ORAL_TABLET | Freq: Two times a day (BID) | ORAL | 3 refills | Status: DC

## 2022-07-20 NOTE — Patient Instructions (Addendum)
Medication Instructions:   DECREASE Brilinta down to 60 mg twice a day 2. Stay on Aspirin 81 mg daily  If you need a refill on your cardiac medications before your next appointment, please call your pharmacy.   Lab work: No new labs needed  Testing/Procedures: No new testing needed  Follow-Up: At [facility], you and your health needs are our priority.  As part of our continuing mission to provide you with exceptional heart care, we have created designated Provider Care Teams.  These Care Teams include your primary Cardiologist (physician) and Advanced Practice Providers (APPs -  Physician Assistants and Nurse Practitioners) who all work together to provide you with the care you need, when you need it.  You will need a follow up appointment in 12 months  Providers on your designated Care Team:   Murray Hodgkins, NP Christell Faith, PA-C Cadence Kathlen Mody, [place]  COVID-19 Vaccine Information can be found at: [email or web address] For questions related to vaccine distribution or appointments, please email [email or web address] or call [phone].

## 2022-08-07 IMAGING — CT CT ABD-PELV W/O CM
2 of 4 series · 16 of 46 positions shown, 18 images · non-contrast
Comparison: None.

CLINICAL DATA: Drainage from the belly button.



[Series 2: axial st · axial · 0.88mm/px · z∈[-932,-492]mm · 13 of 98 slices shown, 15 images]
[im 5/98  soft-tissue]
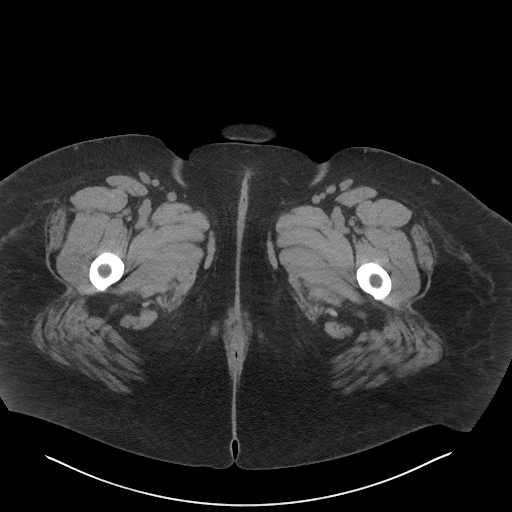
[im 5/98  bone]
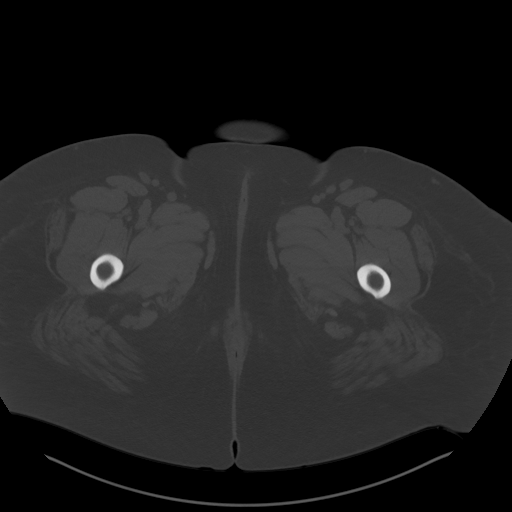
[im 13/98  soft-tissue]
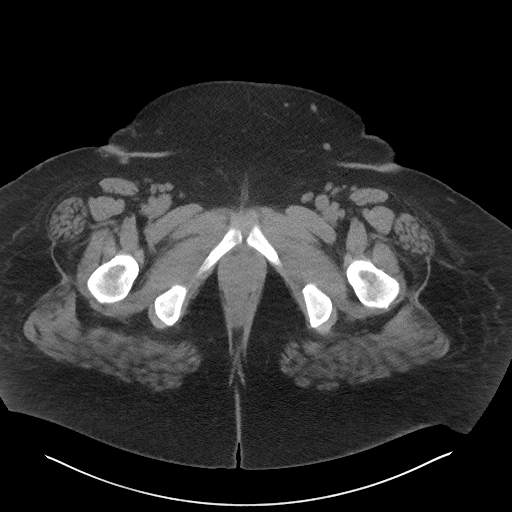
[im 21/98  soft-tissue]
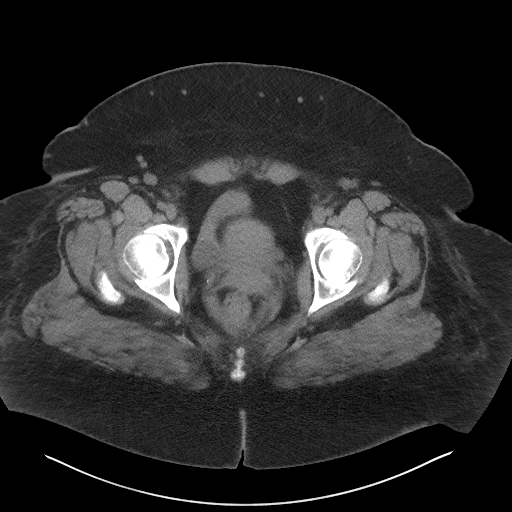
[im 29/98  soft-tissue]
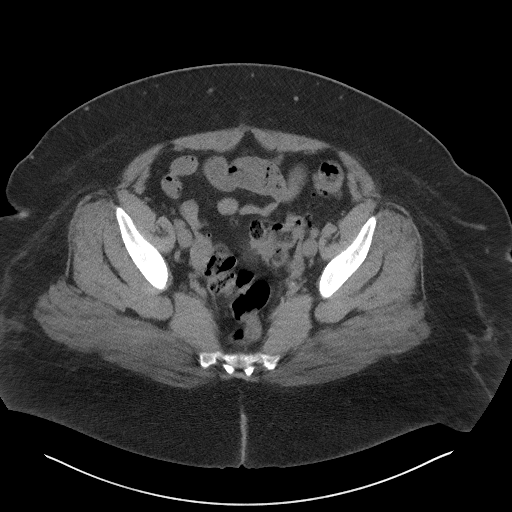
[im 33/98  soft-tissue]
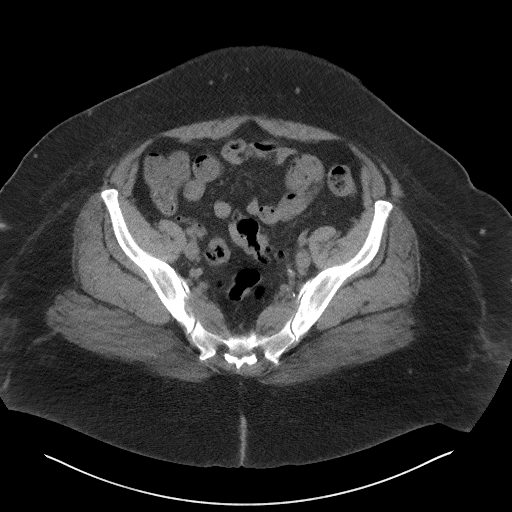
[im 41/98  soft-tissue]
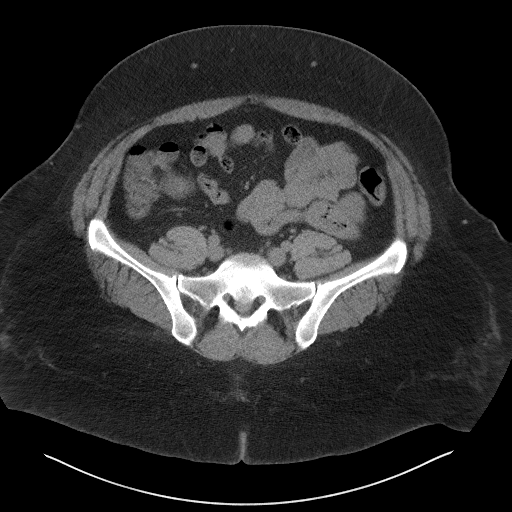
[im 49/98  soft-tissue]
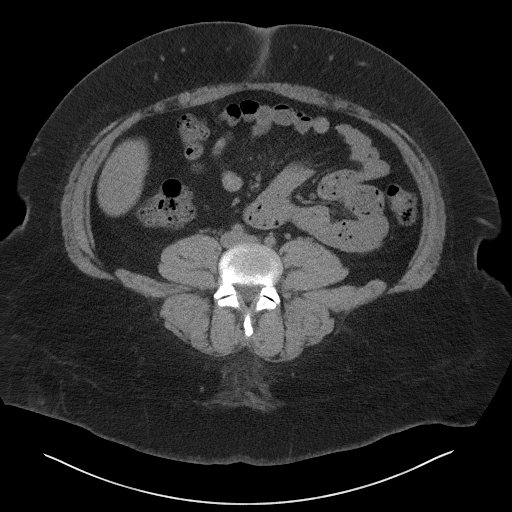
[im 57/98  soft-tissue]
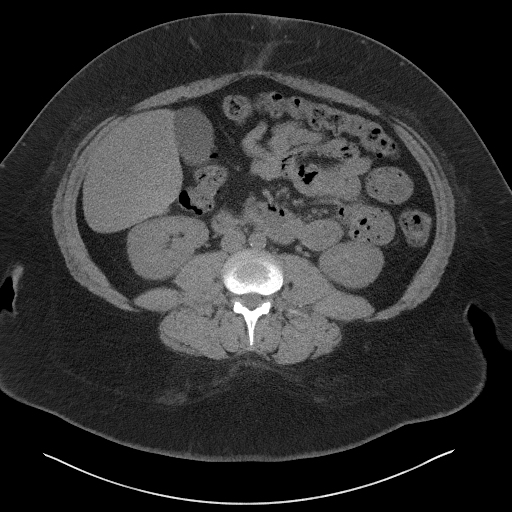
[im 65/98  soft-tissue]
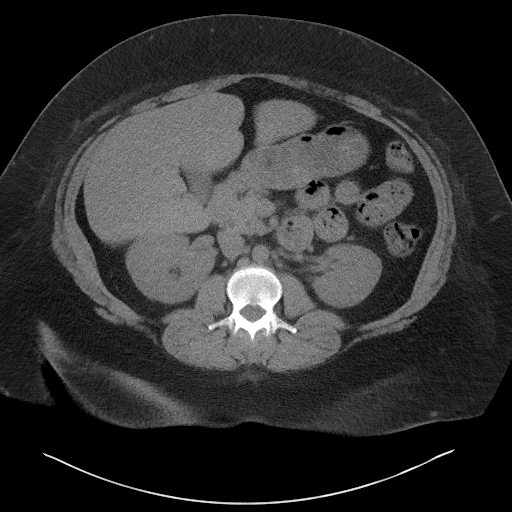
[im 65/98  bone]
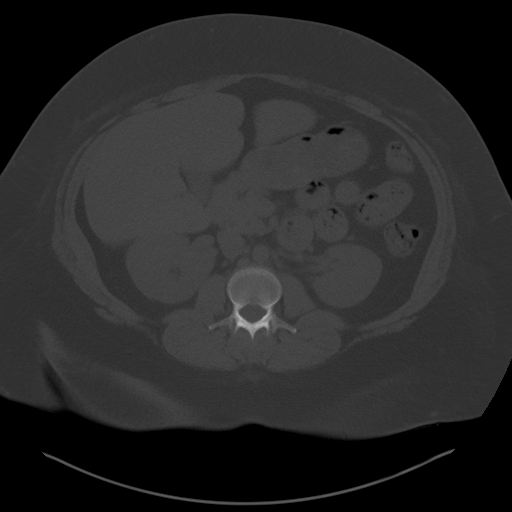
[im 69/98  soft-tissue]
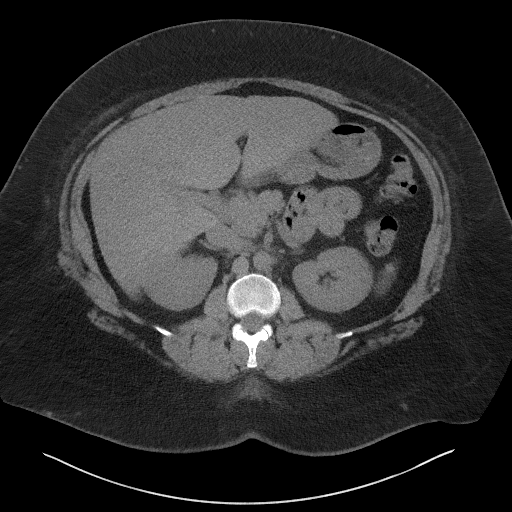
[im 77/98  soft-tissue]
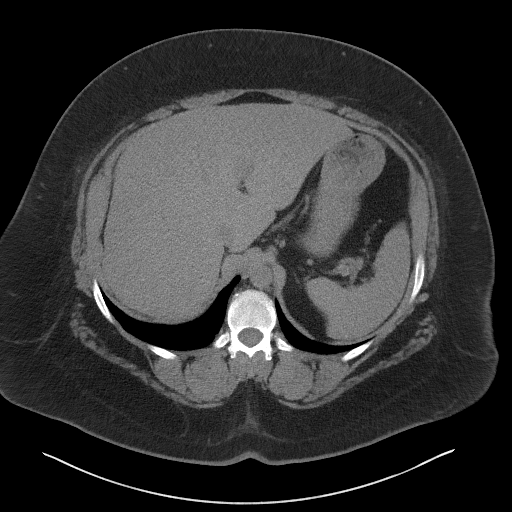
[im 85/98  soft-tissue]
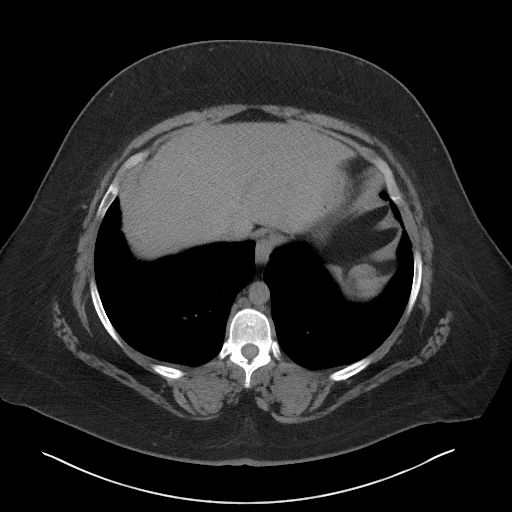
[im 93/98  soft-tissue]
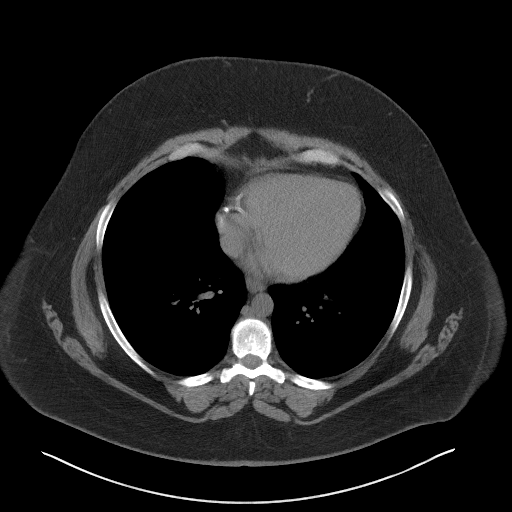

[Series 5: coronal st · coronal · 0.98mm/px · 3 of 120 slices shown]
[im 40/120  soft-tissue]
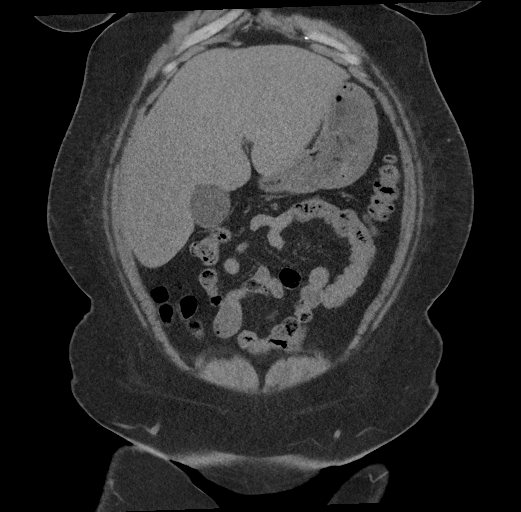
[im 53/120  soft-tissue]
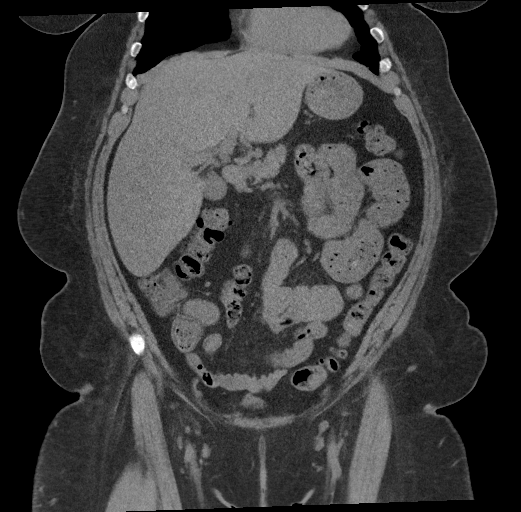
[im 67/120  soft-tissue]
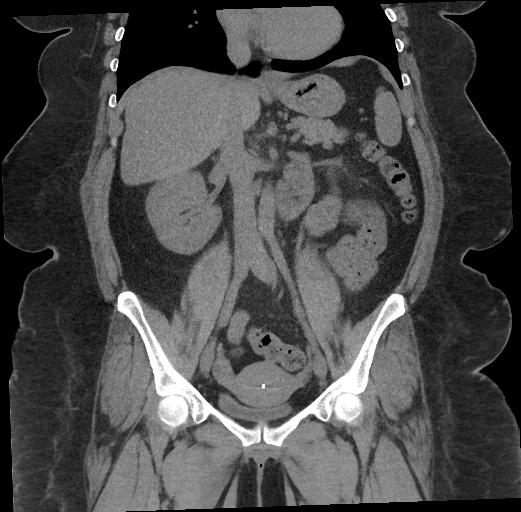

[16 of 46 positions shown; findings below may reference images not displayed]

FINDINGS: Evaluation of this exam is limited in the absence of intravenous
contrast.

Lower chest: The visualized lung bases are clear. There is coronary
vascular calcification.

No intra-abdominal free air or free fluid.

Hepatobiliary: Fatty liver. No intrahepatic biliary ductal
dilatation. The gallbladder is unremarkable.

Pancreas: Unremarkable. No pancreatic ductal dilatation or
surrounding inflammatory changes.

Spleen: Normal in size without focal abnormality.

Adrenals/Urinary Tract: Punctate nonobstructing left renal inferior
pole calculus. No hydronephrosis. The right kidney is unremarkable.
The visualized ureters and urinary bladder appear unremarkable.

Stomach/Bowel: Sigmoid diverticulosis without active inflammatory
changes. There is no bowel obstruction or active inflammation. The
appendix is normal.

Vascular/Lymphatic: The abdominal aorta and IVC unremarkable. No
portal gas. There is no adenopathy.

Reproductive: The uterus is anteverted. An intrauterine device is
noted. No adnexal masses.

Other: There is induration and inflammatory changes of the umbilicus
and periumbilical subcutaneous soft tissues which may represent
cellulitis or incarcerated/strangulated umbilical fat. Clinical
correlation is recommended. No drainable fluid collection or
abscess.

Musculoskeletal: No acute or significant osseous findings.
IMPRESSION: 1. Periumbilical cellulitis or incarcerated/strangulated umbilical
fat. No drainable fluid collection or abscess.
2. Sigmoid diverticulosis. No bowel obstruction. Normal appendix.
3. Fatty liver.
4. Punctate nonobstructing left renal inferior pole calculus. No
hydronephrosis.

## 2022-08-10 ENCOUNTER — Encounter (INDEPENDENT_AMBULATORY_CARE_PROVIDER_SITE_OTHER): Payer: Medicaid Other

## 2022-08-10 DIAGNOSIS — B001 Herpesviral vesicular dermatitis: Secondary | ICD-10-CM

## 2022-08-12 MED ORDER — VALACYCLOVIR HCL 1 G PO TABS: ORAL_TABLET | ORAL | 0 refills | Status: DC

## 2022-08-13 NOTE — Telephone Encounter (Signed)
Please see the MyChart message reply(ies) for my assessment and plan.  The patient gave consent for this Medical Advice Message and is aware that it may result in a bill to their insurance company as well as the possibility that this may result in a co-payment or deductible. They are an established patient, but are not seeking medical advice exclusively about a problem treated during an in person or video visit in the last 7 days. I did not recommend an in person or video visit within 7 days of my reply.  I spent a total of 7 minutes cumulative time within 7 days through MyChart messaging Silvanna Ohmer K Drucella Karbowski, NP  

## 2022-08-23 ENCOUNTER — Telehealth: Payer: Self-pay

## 2022-08-23 NOTE — Telephone Encounter (Signed)
[employer] called asking about service 08/22/2021 asking if the IUD was removed or inserted.  I let her know it was inserted.

## 2022-09-08 ENCOUNTER — Other Ambulatory Visit: Payer: Self-pay | Admitting: Cardiovascular Disease

## 2022-09-25 ENCOUNTER — Telehealth: Payer: Self-pay | Admitting: Family Medicine

## 2022-09-25 NOTE — Telephone Encounter (Signed)
Called and spoke to pt

## 2022-09-25 NOTE — Telephone Encounter (Signed)
Patient called to get a TB test for work. Call back number [phone].

## 2022-09-27 ENCOUNTER — Encounter: Payer: Self-pay | Admitting: Family

## 2022-09-27 ENCOUNTER — Ambulatory Visit: Payer: Medicaid Other | Admitting: Family

## 2022-09-27 VITALS — BP 122/80 | HR 85 | Temp 97.5°F | Ht 65.0 in | Wt 235.2 lb

## 2022-09-27 DIAGNOSIS — I252 Old myocardial infarction: Secondary | ICD-10-CM | POA: Diagnosis not present

## 2022-09-27 DIAGNOSIS — R7982 Elevated C-reactive protein (CRP): Secondary | ICD-10-CM | POA: Insufficient documentation

## 2022-09-27 DIAGNOSIS — E7800 Pure hypercholesterolemia, unspecified: Secondary | ICD-10-CM | POA: Diagnosis not present

## 2022-09-27 DIAGNOSIS — E119 Type 2 diabetes mellitus without complications: Secondary | ICD-10-CM | POA: Diagnosis not present

## 2022-09-27 DIAGNOSIS — I10 Essential (primary) hypertension: Secondary | ICD-10-CM | POA: Diagnosis not present

## 2022-09-27 DIAGNOSIS — M2550 Pain in unspecified joint: Secondary | ICD-10-CM

## 2022-09-27 DIAGNOSIS — D649 Anemia, unspecified: Secondary | ICD-10-CM

## 2022-09-27 DIAGNOSIS — R700 Elevated erythrocyte sedimentation rate: Secondary | ICD-10-CM

## 2022-09-27 DIAGNOSIS — Z1159 Encounter for screening for other viral diseases: Secondary | ICD-10-CM

## 2022-09-27 NOTE — Progress Notes (Signed)
Established Patient Office Visit  Subjective:  Patient ID: Holly Garner, female    DOB: 1980-12-09  Age: 41 y.o. MRN: [id number]  CC:  Chief Complaint  Patient presents with   Establish Care    Transfer of Care Work physical    HPI Holly Garner is here for a transition of care visit as well as for annual physical exam for her job.   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 She did get her ppd, due to be read tomorrow 12/8. Had it placed at [facility] urgent care.   Prior provider was: Dr. Waunita Schooner  Pt is without acute concerns.   Flu vaccine: declines  flu vaccine.  Pa, 06/14/2020 negative  chronic concerns:  Still taking ursodiol, surgeon who did gastric bypass prescribed this for her. She is not currently seeing a GI doctor.   H/o Mi July 2022:taking daily asa 81 mg once daily as well as brilinta 60 mg, sees her cardiologist Dr. Rockey Situ, next to see in September. Sees him every six months.    July 2023, h/o Gastric bypass:   DM2: diet controlled. Overdue for eye exam but states will make appt.  Lab Results  Component Value Date   HGBA1C 5.6 07/02/2022     Past Medical History:  Diagnosis Date   BMI 50.0-59.9, adult ([place])    CAD (coronary artery disease)    a. 04/2021 NSTEMI/PCI: LM nl, LAD 84m 30d, D1 nl, LCX 65mOM1 min irregs, RCA 20p, 30p/m, 100d (2.5x26 Resolute Onyx DES).   Essential hypertension    High cholesterol    History of echocardiogram    a. 04/2021 Echo: EF 60-65%. No rwma. Nl RV fxn.   History of Papanicolaou smear of cervix 04/2011   normal per pt.   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   hgsil   Tobacco abuse    [place] II diabetes mellitus [place] [place]    Past Surgical History:  Procedure Laterality Date   COLPOSCOPY  09/28/2009   CORONARY STENT INTERVENTION N/A 05/04/2021   Procedure: CORONARY STENT INTERVENTION;  Surgeon:  ArWellington HampshireMD;  [facility];  Service: Cardiovascular;  Laterality: N/A;   LEFT HEART CATH AND CORONARY ANGIOGRAPHY N/A 05/04/2021   Procedure: LEFT HEART CATH AND CORONARY ANGIOGRAPHY;  Surgeon: ArWellington HampshireMD;  [facility];  Service: Cardiovascular;  Laterality: N/A;   TONSILLECTOMY AND ADENOIDECTOMY     TYMPANOSTOMY TUBE PLACEMENT Bilateral     Family History  Problem Relation Age of Onset   Healthy Mother    Diabetes Father    Breast cancer Maternal Grandmother        late years   Heart attack Maternal Grandfather        [id number] Diabetes Paternal Grandfather     Social History   Socioeconomic History   Marital status: Married    Spouse name: Not on file   Number of children: 3   Years of education: 12   Highest education level: Not on file  Occupational History   Occupation: unemployed   Occupation: hoScientist, 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[employer]  Tobacco Use   Smoking status: Former    Packs/day: 1.00    Years: 10.00    Total pack years: 10.00    Types: Cigarettes    Quit date: 05/03/2021  Years since quitting: 1.4   Smokeless tobacco: Never  Vaping Use   Vaping Use: Former   Quit date: 03/19/2022  Substance and Sexual Activity   Alcohol use: No   Drug use: No   Sexual activity: Yes    Partners: Male    Birth control/protection: Surgical    Comment: Vasectomy  Other Topics Concern   Not on file  Social History Narrative   Three kiddos   70, 37, 50    Oldest boy, two younger girls   Social Determinants of Health   Financial Resource Strain: Low Risk  (09/05/2021)   Overall Financial Resource Strain (CARDIA)    Difficulty of Paying Living Expenses: Not hard at all  Food Insecurity: No Food Insecurity (09/05/2021)   Hunger Vital Sign    Worried About Running Out of Food in the Last Year: Never true    Ran Out of Food in the Last Year: Never true  Transportation Needs: No Transportation Needs (09/05/2021)    PRAPARE - Hydrologist (Medical): No    Lack of Transportation (Non-Medical): No  Physical Activity: Not on file  Stress: Not on file  Social Connections: Not on file  Intimate Partner Violence: Not on file    Outpatient Medications Prior to Visit  Medication Sig Dispense Refill   aspirin 81 MG chewable tablet Chew 1 tablet (81 mg total) by mouth daily. 30 tablet 0   famotidine (PEPCID) 20 MG tablet Take 20 mg by mouth 2 (two) times daily.     ticagrelor (BRILINTA) 60 MG TABS tablet Take 1 tablet (60 mg total) by mouth 2 (two) times daily. 180 tablet 3   ursodiol (ACTIGALL) 300 MG capsule Take 300 mg by mouth 2 (two) times daily.     valACYclovir (VALTREX) 1000 MG tablet Take 2 tablets by mouth twice daily for one day. 4 tablet 0   ACCU-CHEK GUIDE test strip USE TO TEST BLOOD SUGARS UP TO FOUR TIMES DAILY (Patient not taking: Reported on 07/20/2022) 100 each 1   Accu-Chek Softclix Lancets lancets USE TO TEST BLOOD SUGARS UP TO FOUR TIMES DAILY (Patient not taking: Reported on 07/20/2022) 100 each 1   blood glucose meter kit and supplies KIT Dispense based on patient and insurance preference. Use up to four times daily as directed. (Patient not taking: Reported on 07/20/2022) 1 each 0   EPINEPHrine (EPIPEN 2-PAK) 0.3 mg/0.3 mL IJ SOAJ injection Inject 0.3 mLs (0.3 mg total) into the muscle once. (Patient not taking: Reported on 07/20/2022) 2 Device 0   nitroGLYCERIN (NITROSTAT) 0.4 MG SL tablet Place 1 tablet (0.4 mg total) under the tongue every 5 (five) minutes as needed for chest pain. (Patient not taking: Reported on 07/20/2022) 25 tablet 3   PROAIR HFA 108 (90 Base) MCG/ACT inhaler Inhale 1-2 puffs into the lungs every 6 (six) hours as needed for wheezing or shortness of breath. (Patient not taking: Reported on 07/20/2022) 8.5 g 1   No facility-administered medications prior to visit.    Allergies  Allergen Reactions   Metoprolol Hives   Omeprazole Hives    Statins Hives, Itching and Other (See Comments)    Other reaction(s): Unknown    Atorvastatin Hives and Itching   Rosuvastatin Hives and Itching   Contrast Media [Iodinated Contrast Media] Itching    ROS Review of Systems  Review of Systems  Respiratory:  Negative for shortness of breath.   Cardiovascular:  Negative for chest pain and palpitations.  Gastrointestinal:  Negative for constipation and diarrhea.  Genitourinary:  Negative for dysuria, frequency and urgency.  Musculoskeletal:  Negative for myalgias.  Psychiatric/Behavioral:  Negative for depression and suicidal ideas.   All other systems reviewed and are negative.    Objective:    Physical Exam Vitals reviewed.  Constitutional:      General: She is not in acute distress.    Appearance: Normal appearance. She is obese. She is not ill-appearing or toxic-appearing.  HENT:     Right Ear: Tympanic membrane normal.     Left Ear: Tympanic membrane normal.     Ears:     Comments: Right tube in ear canal     Mouth/Throat:     Mouth: Mucous membranes are moist.     Pharynx: No pharyngeal swelling.     Tonsils: No tonsillar exudate.  Eyes:     Extraocular Movements: Extraocular movements intact.     Conjunctiva/sclera: Conjunctivae normal.     Pupils: Pupils are equal, round, and reactive to light.  Neck:     Thyroid: No thyroid mass.  Cardiovascular:     Rate and Rhythm: Normal rate and regular rhythm.  Pulmonary:     Effort: Pulmonary effort is normal.     Breath sounds: Normal breath sounds.  Abdominal:     General: Abdomen is flat. Bowel sounds are normal.     Palpations: Abdomen is soft.  Musculoskeletal:        General: Normal range of motion.  Lymphadenopathy:     Cervical:     Right cervical: No superficial cervical adenopathy.    Left cervical: No superficial cervical adenopathy.  Skin:    General: Skin is warm.     Capillary Refill: Capillary refill takes less than 2 seconds.  Neurological:      General: No focal deficit present.     Mental Status: She is alert and oriented to person, place, and time.  Psychiatric:        Mood and Affect: Mood normal.        Behavior: Behavior normal.        Thought Content: Thought content normal.        Judgment: Judgment normal.     Gen: NAD, resting comfortably CV: RRR with no murmurs appreciated Pulm: NWOB, CTAB with no crackles, wheezes, or rhonchi Skin: warm, dry Psych: Normal affect and thought content  BP 122/80   Pulse 85   Temp (!) 97.5 F (36.4 C)   Ht _0  (1.651 m)   Wt 235 lb 3.2 oz (106.7 kg)   SpO2 99%   BMI 39.14 kg/m  Wt Readings from Last 3 Encounters:  09/27/22 235 lb 3.2 oz (106.7 kg)  07/20/22 262 lb 8 oz (119.1 kg)  07/02/22 269 lb (122 kg)     Health Maintenance Due  Topic Date Due   Diabetic kidney evaluation - Urine ACR  Never done   Hepatitis C Screening  Never done   OPHTHALMOLOGY EXAM  08/05/2020    There are no preventive care reminders to display for this patient.  Lab Results  Component Value Date   TSH 1.50 06/29/2021   Lab Results  Component Value Date   WBC 12.5 (H) 12/19/2021   HGB 11.8 (L) 12/19/2021   HCT 39.5 12/19/2021   MCV 79.5 (L) 12/19/2021   PLT 456 (H) 12/19/2021   Lab Results  Component Value Date   NA 142 12/19/2021   K 3.7 12/19/2021   CO2 27  12/19/2021   GLUCOSE 208 (H) 12/19/2021   BUN 20 12/19/2021   CREATININE 0.67 12/19/2021   BILITOT 0.4 08/22/2021   ALKPHOS 113 08/22/2021   AST 13 08/22/2021   ALT 14 08/22/2021   PROT 6.3 08/22/2021   ALBUMIN 3.7 08/22/2021   CALCIUM 9.3 12/19/2021   ANIONGAP 13 12/19/2021   GFR 112.87 08/22/2021   Lab Results  Component Value Date   CHOL 118 08/22/2021   Lab Results  Component Value Date   HDL 35.60 (L) 08/22/2021   Lab Results  Component Value Date   LDLCALC 65 08/22/2021   Lab Results  Component Value Date   TRIG 89.0 08/22/2021   Lab Results  Component Value Date   CHOLHDL 3 08/22/2021    Lab Results  Component Value Date   HGBA1C 5.6 07/02/2022      Assessment & Plan:   Problem List Items Addressed This Visit       Cardiovascular and Mediastinum   HTN (hypertension)   Relevant Orders   Comprehensive metabolic panel     Endocrine   Diet-controlled diabetes mellitus ([facility])    Urine microalbumin ordered. Continue with diet control  Schedule eye exam.       Relevant Orders   Comprehensive metabolic panel   Microalbumin / creatinine urine ratio     Other   Morbid obesity (HCC)   Hx of non-ST elevation myocardial infarction (NSTEMI)    Continue f/u with cardiologist as scheduled. Continue 81 mg asa as well as brilinta 60 mg once daily.       Pure hypercholesterolemia - Primary   Relevant Orders   Lipid panel   Anemia   Relevant Orders   CBC   Encounter for hepatitis C screening test for low risk patient    Hep c ordered pending results.      Polyarthralgia   Relevant Orders   ANA   Sedimentation rate   Elevated C-reactive protein (CRP)   Elevated sed rate    No orders of the defined types were placed in this encounter.   Follow-up: Return in about 6 months (around 03/29/2023) for f/u diabetes.    Eugenia Pancoast, FNP

## 2022-09-27 NOTE — Patient Instructions (Addendum)
Welcome to our clinic, I am happy to have you as my new patient. I am excited to continue on this healthcare journey with you.   ------------------------------------  I have created an order for lab work today during our visit.  Please schedule an appointment on your way out to return to the lab at your convenience. Please return fasting at your lab appointment (meaning you can only drink black coffee and or water prior to your appointment). I will reach out to you in regards to the labs when I receive the results.    ------------------------------------  Stop by the lab prior to leaving today. I will notify you of your results once received.   Please keep in mind Any my chart messages you send have up to a three business day turnaround for a response.  Phone calls may take up to a one full business day turnaround for a  response.   If you need a medication refill I recommend you request it through the pharmacy as this is easiest for [place] rather than sending a message and or phone call.   Due to recent changes in healthcare laws, you may see results of your imaging and/or laboratory studies on MyChart before I have had a chance to review them.  I understand that in some cases there may be results that are confusing or concerning to you. Please understand that not all results are received at the same time and often I may need to interpret multiple results in order to provide you with the best plan of care or course of treatment. Therefore, I ask that you please give me 2 business days to thoroughly review all your results before contacting my office for clarification. Should we see a critical lab result, you will be contacted sooner.   It was a pleasure seeing you today! Please do not hesitate to reach out with any questions and or concerns.  Regards,   Mort Sawyers FNP-C

## 2022-09-29 NOTE — Assessment & Plan Note (Signed)
Continue f/u with cardiologist as scheduled. Continue 81 mg asa as well as brilinta 60 mg once daily.

## 2022-09-29 NOTE — Assessment & Plan Note (Signed)
Urine microalbumin ordered. Continue with diet control  Schedule eye exam.

## 2022-09-29 NOTE — Assessment & Plan Note (Signed)
Hep c ordered pending results.

## 2022-10-01 ENCOUNTER — Encounter: Payer: Medicaid Other | Admitting: Family

## 2022-10-19 ENCOUNTER — Telehealth: Payer: Self-pay | Admitting: Family

## 2022-10-19 NOTE — Telephone Encounter (Signed)
Type of forms received:Health Assessment  Routed VZ:DGLOV Pool  Paperwork received by : Lesly Rubenstein   Individual made aware of 3-5 business day turn around (Y/N): Y  Form completed and patient made aware of charges(Y/N): Y   Faxed to :   Form location: Place in PCP folder

## 2022-10-19 NOTE — Telephone Encounter (Signed)
Noted will place in [place] box

## 2022-10-23 ENCOUNTER — Encounter: Payer: Self-pay | Admitting: Family

## 2022-10-24 NOTE — Telephone Encounter (Signed)
Completed, in outbox please notify pt and leave at front desk for pick up Make copy for scans.

## 2022-10-24 NOTE — Telephone Encounter (Signed)
PPW in your box for review.  

## 2022-10-25 NOTE — Telephone Encounter (Signed)
Pt called asking for the status of her health assessment ppw? Call back # [phone]

## 2022-10-29 NOTE — Telephone Encounter (Signed)
Mychart message sent to patient notifying her that ppw is ready to be picked up.

## 2022-11-09 ENCOUNTER — Encounter: Payer: Self-pay | Admitting: Family

## 2022-11-09 ENCOUNTER — Encounter: Payer: Self-pay | Admitting: *Deleted

## 2022-11-09 ENCOUNTER — Encounter: Payer: Self-pay | Admitting: Primary Care

## 2022-11-09 ENCOUNTER — Ambulatory Visit: Payer: Medicaid Other | Admitting: Primary Care

## 2022-11-09 VITALS — BP 128/84 | HR 75 | Temp 98.0°F | Ht 65.0 in | Wt 221.0 lb

## 2022-11-09 DIAGNOSIS — I10 Essential (primary) hypertension: Secondary | ICD-10-CM | POA: Diagnosis not present

## 2022-11-09 DIAGNOSIS — R7982 Elevated C-reactive protein (CRP): Secondary | ICD-10-CM | POA: Diagnosis not present

## 2022-11-09 DIAGNOSIS — R609 Edema, unspecified: Secondary | ICD-10-CM | POA: Diagnosis not present

## 2022-11-09 DIAGNOSIS — K909 Intestinal malabsorption, unspecified: Secondary | ICD-10-CM | POA: Diagnosis not present

## 2022-11-09 DIAGNOSIS — T783XXS Angioneurotic edema, sequela: Secondary | ICD-10-CM | POA: Diagnosis not present

## 2022-11-09 DIAGNOSIS — D649 Anemia, unspecified: Secondary | ICD-10-CM | POA: Diagnosis not present

## 2022-11-09 DIAGNOSIS — I219 Acute myocardial infarction, unspecified: Secondary | ICD-10-CM | POA: Diagnosis not present

## 2022-11-09 DIAGNOSIS — Z955 Presence of coronary angioplasty implant and graft: Secondary | ICD-10-CM | POA: Diagnosis not present

## 2022-11-09 DIAGNOSIS — E119 Type 2 diabetes mellitus without complications: Secondary | ICD-10-CM | POA: Diagnosis not present

## 2022-11-09 DIAGNOSIS — Z87898 Personal history of other specified conditions: Secondary | ICD-10-CM

## 2022-11-09 DIAGNOSIS — Z9884 Bariatric surgery status: Secondary | ICD-10-CM | POA: Diagnosis not present

## 2022-11-09 DIAGNOSIS — R700 Elevated erythrocyte sedimentation rate: Secondary | ICD-10-CM | POA: Diagnosis not present

## 2022-11-09 DIAGNOSIS — E7800 Pure hypercholesterolemia, unspecified: Secondary | ICD-10-CM | POA: Diagnosis not present

## 2022-11-09 MED ORDER — SCOPOLAMINE 1 MG/3DAYS TD PT72: 1.0000 | MEDICATED_PATCH | TRANSDERMAL | 0 refills | Status: DC

## 2022-11-09 MED ORDER — PREDNISONE 20 MG PO TABS: ORAL_TABLET | ORAL | 0 refills | Status: DC

## 2022-11-09 MED ORDER — EPINEPHRINE 0.3 MG/0.3ML IJ SOAJ: 0.3000 mg | INTRAMUSCULAR | 0 refills | Status: AC | PRN

## 2022-11-09 NOTE — Assessment & Plan Note (Signed)
Acute on chronic today.  Reviewed patient's past medical history.  Start prednisone 20 mg tablets. Take 2 tablets by mouth once daily in the morning for 5 days. She kindly declines a steroid injection today.  Referral placed to an allergist for potential allergy injections.  Refill provided for Epi Pen.  Precautions provided.

## 2022-11-09 NOTE — Assessment & Plan Note (Signed)
Rx for scopolamine patches sent to pharmacy. Discussed instructions for use.

## 2022-11-09 NOTE — Progress Notes (Signed)
Subjective:    Patient ID: Holly Garner, female    DOB: 07-17-1981, 42 y.o.   MRN: [id number]  HPI  Holly Garner is a very pleasant 42 y.o. female patient of Tabitha, NP with a history of type 2 diabetes, NSTEMI, angioneurotic edema, idiopathic urtica, seasonal allergies who presents today to discuss eye swelling and requesting motion sickness patches.  Symptom onset this morning with right eye swelling, woke up swollen shut. She did notice left cheek swelling last night which is about the same today.   Chronic history of swelling to her feet, knees, hands, upper extremities with itching since 2016.   She's been evaluated by numerous providers in the past (allergists, pulmonologist's) and no one can find a cause for her sporadic swelling.   She's tried Zyrtec, Claritin, Benadryl, and Allegra but none historically work. She does respond well to prednisone. She is managed on famotidine 20 mg daily.   She denies changes in medications, supplements, foods. She denies nausea, vomiting, crusty drainage, wheezing, throat tightness. She is needing a refill of her Epi Pen as it is expired. She is interested in seeing another allergist. Has never tried allergy injections.  She will be leaving for a cruise in 9 days, the cruise duration is 7 days. She has terrible motion sickness and has tried Dramamine and OTC motion sickness patches without improvement. She is looking for another option.    Review of Systems  Constitutional:  Negative for fever.  HENT:  Negative for congestion.   Eyes:  Positive for itching. Negative for pain and discharge.       Right eye swelling  Skin:  Positive for color change.  Allergic/Immunologic: Positive for environmental allergies.         Past Medical History:  Diagnosis Date   BMI 50.0-59.9, adult ([place])    CAD (coronary artery disease)    a. 04/2021 NSTEMI/PCI: LM nl, LAD 50m, 30d, D1 nl, LCX 87m, OM1 min irregs, RCA 20p, 30p/m, 100d (2.5x26  Resolute Onyx DES).   Essential hypertension    High cholesterol    History of echocardiogram    a. 04/2021 Echo: EF 60-65%. No rwma. Nl RV fxn.   History of Papanicolaou smear of cervix 04/2011   normal per pt.   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   hgsil   Tobacco abuse    Type II diabetes mellitus ([place])     Social History   Socioeconomic History   Marital status: Married    Spouse name: Not on file   Number of children: 3   Years of education: 12   Highest education level: Not on file  Occupational History   Occupation: unemployed   Occupation: Scientist, 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: [employer]  Tobacco Use   Smoking status: Former    Packs/day: 1.00    Years: 10.00    Total pack years: 10.00    Types: Cigarettes    Quit date: 05/03/2021    Years since quitting: 1.5   Smokeless tobacco: Never  Vaping Use   Vaping Use: Former   Quit date: 03/19/2022  Substance and Sexual Activity   Alcohol use: No   Drug use: No   Sexual activity: Yes    Partners: Male    Birth control/protection: Surgical    Comment: Vasectomy  Other Topics Concern   Not on file  Social History Narrative   Three kiddos   29, 45, 60    Oldest boy,  two younger girls   Social Determinants of Health   Financial Resource Strain: Low Risk  (09/05/2021)   Overall Financial Resource Strain (CARDIA)    Difficulty of Paying Living Expenses: Not hard at all  Food Insecurity: No Food Insecurity (09/05/2021)   Hunger Vital Sign    Worried About Running Out of Food in the Last Year: Never true    Ran Out of Food in the Last Year: Never true  Transportation Needs: No Transportation Needs (09/05/2021)   PRAPARE - Administrator, Civil Service (Medical): No    Lack of Transportation (Non-Medical): No  Physical Activity: Not on file  Stress: Not on file  Social Connections: Not on file  Intimate Partner Violence: Not on file    Past Surgical History:  Procedure Laterality  Date   COLPOSCOPY  09/28/2009   CORONARY STENT INTERVENTION N/A 05/04/2021   Procedure: CORONARY STENT INTERVENTION;  Surgeon: Iran Ouch, MD;  Location: [facility];  Service: Cardiovascular;  Laterality: N/A;   LEFT HEART CATH AND CORONARY ANGIOGRAPHY N/A 05/04/2021   Procedure: LEFT HEART CATH AND CORONARY ANGIOGRAPHY;  Surgeon: Iran Ouch, MD;  Location: [facility];  Service: Cardiovascular;  Laterality: N/A;   TONSILLECTOMY AND ADENOIDECTOMY     TYMPANOSTOMY TUBE PLACEMENT Bilateral     Family History  Problem Relation Age of Onset   Healthy Mother    Diabetes Father    Breast cancer Maternal Grandmother        late years   Heart attack Maternal Grandfather        46   Diabetes Paternal Grandfather     Allergies  Allergen Reactions   Metoprolol Hives   Omeprazole Hives   Statins Hives, Itching and Other (See Comments)    Other reaction(s): Unknown    Atorvastatin Hives and Itching   Rosuvastatin Hives and Itching   Contrast Media [Iodinated Contrast Media] Itching    Current Outpatient Medications on File Prior to Visit  Medication Sig Dispense Refill   aspirin 81 MG chewable tablet Chew 1 tablet (81 mg total) by mouth daily. 30 tablet 0   famotidine (PEPCID) 20 MG tablet Take 20 mg by mouth 2 (two) times daily.     ticagrelor (BRILINTA) 60 MG TABS tablet Take 1 tablet (60 mg total) by mouth 2 (two) times daily. 180 tablet 3   ursodiol (ACTIGALL) 300 MG capsule Take 300 mg by mouth 2 (two) times daily.     valACYclovir (VALTREX) 1000 MG tablet Take 2 tablets by mouth twice daily for one day. 4 tablet 0   No current facility-administered medications on file prior to visit.    BP 128/84   Pulse 75   Temp 98 F (36.7 C) (Oral)   Ht 5\' 5"  (1.651 m)   Wt 221 lb (100.2 kg)   SpO2 99%   BMI 36.78 kg/m  Objective:   Physical Exam Eyes:     Conjunctiva/sclera:     Right eye: Right conjunctiva is not injected. No exudate.     Comments: Right eye upper and lower eyelids swollen completely shut. No crusting or drainage.   Cardiovascular:     Rate and Rhythm: Normal rate.  Pulmonary:     Effort: Pulmonary effort is normal.  Neurological:     Mental Status: She is alert.           Assessment & Plan:  Angioedema, sequela Assessment & Plan: Acute on chronic today.  Reviewed patient's past medical history.  Start prednisone 20 mg tablets. Take 2 tablets by mouth once daily in the morning for 5 days. She kindly declines a steroid injection today.  Referral placed to an allergist for potential allergy injections.  Refill provided for Epi Pen.  Precautions provided.   Orders: -     predniSONE; Take 2 tablets by mouth once daily in the morning for 5 days.  Dispense: 10 tablet; Refill: 0 -     EPINEPHrine; Inject 0.3 mg into the muscle as needed for anaphylaxis.  Dispense: 1 each; Refill: 0 -     Ambulatory referral to Allergy  History of motion sickness Assessment & Plan: Rx for scopolamine patches sent to pharmacy. Discussed instructions for use.    Orders: -     Scopolamine; Place 1 patch (1.5 mg total) onto the skin every 3 (three) days.  Dispense: 10 patch; Refill: 0        Pleas Koch, NP

## 2022-11-09 NOTE — Telephone Encounter (Signed)
Called and scheduled patient appointment with Allie Bossier @ 9:20.

## 2022-11-09 NOTE — Patient Instructions (Signed)
Start prednisone tablets. Take two tablets my mouth once daily in the morning for four days, then one tablet once daily in the morning for four days.   Use the Epi Pen if needed.  It was a pleasure to see you today!

## 2022-11-12 ENCOUNTER — Encounter: Payer: Self-pay | Admitting: Family

## 2022-11-30 DIAGNOSIS — Z955 Presence of coronary angioplasty implant and graft: Secondary | ICD-10-CM | POA: Diagnosis not present

## 2022-11-30 DIAGNOSIS — Z9884 Bariatric surgery status: Secondary | ICD-10-CM | POA: Insufficient documentation

## 2022-11-30 DIAGNOSIS — I10 Essential (primary) hypertension: Secondary | ICD-10-CM | POA: Diagnosis not present

## 2022-11-30 DIAGNOSIS — Z6837 Body mass index (BMI) 37.0-37.9, adult: Secondary | ICD-10-CM | POA: Diagnosis not present

## 2022-11-30 DIAGNOSIS — E785 Hyperlipidemia, unspecified: Secondary | ICD-10-CM | POA: Diagnosis not present

## 2022-11-30 DIAGNOSIS — E119 Type 2 diabetes mellitus without complications: Secondary | ICD-10-CM | POA: Diagnosis not present

## 2022-12-04 ENCOUNTER — Encounter: Payer: Self-pay | Admitting: Family

## 2022-12-04 DIAGNOSIS — B001 Herpesviral vesicular dermatitis: Secondary | ICD-10-CM

## 2022-12-04 MED ORDER — VALACYCLOVIR HCL 1 G PO TABS: ORAL_TABLET | ORAL | 0 refills | Status: DC

## 2023-01-02 ENCOUNTER — Ambulatory Visit: Payer: Medicaid Other | Admitting: Nurse Practitioner

## 2023-01-02 ENCOUNTER — Ambulatory Visit: Payer: Medicaid Other | Admitting: Family

## 2023-01-02 ENCOUNTER — Encounter: Payer: Self-pay | Admitting: Family

## 2023-01-02 VITALS — BP 110/64 | HR 79 | Temp 98.1°F | Ht 65.0 in | Wt 214.4 lb

## 2023-01-02 DIAGNOSIS — J029 Acute pharyngitis, unspecified: Secondary | ICD-10-CM

## 2023-01-02 DIAGNOSIS — R21 Rash and other nonspecific skin eruption: Secondary | ICD-10-CM | POA: Diagnosis not present

## 2023-01-02 DIAGNOSIS — Z20822 Contact with and (suspected) exposure to covid-19: Secondary | ICD-10-CM | POA: Diagnosis not present

## 2023-01-02 DIAGNOSIS — U071 COVID-19: Secondary | ICD-10-CM | POA: Diagnosis not present

## 2023-01-02 DIAGNOSIS — Z20828 Contact with and (suspected) exposure to other viral communicable diseases: Secondary | ICD-10-CM | POA: Diagnosis not present

## 2023-01-02 DIAGNOSIS — J020 Streptococcal pharyngitis: Secondary | ICD-10-CM | POA: Diagnosis not present

## 2023-01-02 LAB — POCT RAPID STREP A (OFFICE): Rapid Strep A Screen: POSITIVE — AB

## 2023-01-02 LAB — POCT INFLUENZA A/B
Influenza A, POC: NEGATIVE
Influenza B, POC: NEGATIVE

## 2023-01-02 LAB — POC COVID19 BINAXNOW: SARS Coronavirus 2 Ag: POSITIVE — AB

## 2023-01-02 MED ORDER — AMOXICILLIN 500 MG PO CAPS: 500.0000 mg | ORAL_CAPSULE | Freq: Two times a day (BID) | ORAL | 0 refills | Status: AC

## 2023-01-02 MED ORDER — TRIAMCINOLONE ACETONIDE 0.1 % EX CREA: 1.0000 | TOPICAL_CREAM | Freq: Two times a day (BID) | CUTANEOUS | 0 refills | Status: DC

## 2023-01-02 NOTE — Assessment & Plan Note (Signed)
Advised of CDC guidelines for self isolation/ ending isolation.  Advised of safe practice guidelines. Symptom Tier reviewed.  Encouraged to monitor for any worsening symptoms; watch for increased shortness of breath, weakness, and signs of dehydration. Advised when to seek emergency care.  Instructed to rest and hydrate well.  Advised to leave the house during recommended isolation period, only if it is necessary to seek medical care  Pt declines antiviral  Did recommend daily mucinex prn to help with congestion

## 2023-01-02 NOTE — Patient Instructions (Signed)
------------------------------------    You were found to be strep positive,  Take antibiotics that have been sent to the pharmacy.  Change your toothbrush after 24 hours on the antibiotics.  Gargle with warm salt water as needed for sore throat.   ------------------------------------   ------------------------------------ Your COVID test is positive. You should remain isolated and quarantine for at least 5 days from start of symptoms. You must be feeling better and be fever free without any fever reducers for at least 24 hours as well. You should wear a mask at all times when out of your home or around others for 5 days after leaving isolation.  Your household contacts should be tested as well as work contacts. If you feel worse or have increasing shortness of breath, you should be seen in person at urgent care or the emergency room.   ------------------------------------

## 2023-01-02 NOTE — Assessment & Plan Note (Signed)
Strep tested positive in office.  rx for amox 500 mg po bid x 10 days.  Ibuprofen/tyelnol prn sore throat/fever Pt told to F/u if no improvement in the next 2-3 days.  

## 2023-01-02 NOTE — Assessment & Plan Note (Signed)
Could be viral rash however will send  Triamcinolone cream 0.1%

## 2023-01-02 NOTE — Progress Notes (Signed)
Established Patient Office Visit  Subjective:   Patient ID: Holly Garner, female    DOB: 03-May-1981  Age: 42 y.o. MRN: [id number]  CC:  Chief Complaint  Patient presents with   Headache    HPI: Holly Garner is a 42 y.o. female presenting on 01/02/2023 for Headache   Headache     3 days of symptoms with c/o headache, nasal congestion and sinus pressure, rhinorrhea, non productive cough and slight chest congestion. Slight sore throat that comes and goes and some mild ear pain.   Taking otc mucinex with mild relief.   No fever or chills.   Rash on right lower anterior forearm, itching a lot. Started three days ago with onset of symptoms as well.          ROS: Negative unless specifically indicated above in HPI.   Relevant past medical history reviewed and updated as indicated.   Allergies and medications reviewed and updated.   Current Outpatient Medications:    amoxicillin (AMOXIL) 500 MG capsule, Take 1 capsule (500 mg total) by mouth 2 (two) times daily for 10 days., Disp: 20 capsule, Rfl: 0   aspirin 81 MG chewable tablet, Chew 1 tablet (81 mg total) by mouth daily., Disp: 30 tablet, Rfl: 0   EPINEPHrine 0.3 mg/0.3 mL IJ SOAJ injection, Inject 0.3 mg into the muscle as needed for anaphylaxis., Disp: 1 each, Rfl: 0   ticagrelor (BRILINTA) 60 MG TABS tablet, Take 1 tablet (60 mg total) by mouth 2 (two) times daily., Disp: 180 tablet, Rfl: 3   triamcinolone cream (KENALOG) 0.1 %, Apply 1 Application topically 2 (two) times daily., Disp: 30 g, Rfl: 0   valACYclovir (VALTREX) 1000 MG tablet, Take two tablets po bid for one dose prn outbreak, Disp: 30 tablet, Rfl: 0  Allergies  Allergen Reactions   Metoprolol Hives   Omeprazole Hives   Statins Hives, Itching and Other (See Comments)    Other reaction(s): Unknown    Atorvastatin Hives and Itching   Rosuvastatin Hives and Itching   Contrast Media [Iodinated Contrast Media] Itching    Objective:   BP  110/64   Pulse 79   Temp 98.1 F (36.7 C) (Temporal)   Ht '5\' 5"'$  (1.651 m)   Wt 214 lb 6.4 oz (97.3 kg)   SpO2 99%   BMI 35.68 kg/m    Physical Exam Constitutional:      General: She is not in acute distress.    Appearance: Normal appearance. She is obese. She is not ill-appearing, toxic-appearing or diaphoretic.  HENT:     Head: Normocephalic.     Right Ear: Tympanic membrane normal.     Left Ear: Tympanic membrane normal.     Nose: Congestion and rhinorrhea present.     Right Sinus: No maxillary sinus tenderness or frontal sinus tenderness.     Left Sinus: No maxillary sinus tenderness or frontal sinus tenderness.     Mouth/Throat:     Mouth: Mucous membranes are dry.     Pharynx: Posterior oropharyngeal erythema (slight) present. No oropharyngeal exudate.  Eyes:     Extraocular Movements: Extraocular movements intact.     Pupils: Pupils are equal, round, and reactive to light.  Cardiovascular:     Rate and Rhythm: Normal rate and regular rhythm.     Pulses: Normal pulses.     Heart sounds: Normal heart sounds.  Pulmonary:     Effort: Pulmonary effort is normal.     Breath sounds:  Normal breath sounds.  Musculoskeletal:     Cervical back: Normal range of motion.  Skin:    Comments: Three small papular slightly erythematic non scaly lesions on right anterior lower forearm  Neurological:     General: No focal deficit present.     Mental Status: She is alert and oriented to person, place, and time. Mental status is at baseline.  Psychiatric:        Mood and Affect: Mood normal.        Behavior: Behavior normal.        Thought Content: Thought content normal.        Judgment: Judgment normal.     Assessment & Plan:  Rash Assessment & Plan: Could be viral rash however will send  Triamcinolone cream 0.1%   Orders: -     Triamcinolone Acetonide; Apply 1 Application topically 2 (two) times daily.  Dispense: 30 g; Refill: 0  Exposure to the flu -     POCT Influenza  A/B  Contact with and (suspected) exposure to covid-19 -     POC COVID-19 BinaxNow  Sore throat -     POCT rapid strep A  Strep pharyngitis Assessment & Plan: Strep tested positive in office.  rx for amox 500 mg po bid x 10 days.  Ibuprofen/tyelnol prn sore throat/fever Pt told to F/u if no improvement in the next 2-3 days.   Orders: -     Amoxicillin; Take 1 capsule (500 mg total) by mouth 2 (two) times daily for 10 days.  Dispense: 20 capsule; Refill: 0  COVID-19 Assessment & Plan: Advised of CDC guidelines for self isolation/ ending isolation.  Advised of safe practice guidelines. Symptom Tier reviewed.  Encouraged to monitor for any worsening symptoms; watch for increased shortness of breath, weakness, and signs of dehydration. Advised when to seek emergency care.  Instructed to rest and hydrate well.  Advised to leave the house during recommended isolation period, only if it is necessary to seek medical care  Pt declines antiviral  Did recommend daily mucinex prn to help with congestion       Follow up plan: Return if symptoms worsen or fail to improve.  Eugenia Pancoast, FNP

## 2023-01-07 ENCOUNTER — Ambulatory Visit: Payer: Medicaid Other | Admitting: Obstetrics and Gynecology

## 2023-01-07 ENCOUNTER — Encounter: Payer: Self-pay | Admitting: Obstetrics and Gynecology

## 2023-01-07 VITALS — BP 110/78 | Ht 65.0 in | Wt 212.0 lb

## 2023-01-07 DIAGNOSIS — Z3202 Encounter for pregnancy test, result negative: Secondary | ICD-10-CM | POA: Diagnosis not present

## 2023-01-07 DIAGNOSIS — Z30431 Encounter for routine checking of intrauterine contraceptive device: Secondary | ICD-10-CM

## 2023-01-07 DIAGNOSIS — N939 Abnormal uterine and vaginal bleeding, unspecified: Secondary | ICD-10-CM | POA: Diagnosis not present

## 2023-01-07 LAB — POCT URINE PREGNANCY: Preg Test, Ur: NEGATIVE

## 2023-01-07 NOTE — Progress Notes (Signed)
Holly Pancoast, FNP   Chief Complaint  Patient presents with   Vaginal Bleeding    Last cycle started 2/21 and has not stopped since then, no abnormal pain. First time ever    HPI:      [name] is a 42 y.o. [id number] whose LMP was Patient's last menstrual period was 12/12/2022 (exact date)., presents today for BTB for 1 month with IUD. Menses started a week early and has been moderate flow the whole time, no clots, mild dysmen, improved with tylenol. Menses usually 5-7 days, mod flow, no BTB. Was amenorrheic with Mirena placed 11/22 until bariatric surgery 7/23 and lost 100 #. Started with monthly menses after surgery. Also on brilinta since s/p NSTEMI 7/22. She is sexually active, no pain/bleeding. Neg pap/neg HPV DNA 8/21. Pap due 8/24.   Patient Active Problem List   Diagnosis Date Noted   Rash 01/02/2023   COVID-19 01/02/2023   Strep pharyngitis 01/02/2023   History of gastric bypass 11/30/2022   History of motion sickness 11/09/2022   Anemia 09/27/2022   Encounter for hepatitis C screening test for low risk patient 09/27/2022   Polyarthralgia 09/27/2022   Elevated C-reactive protein (CRP) 09/27/2022   Elevated sed rate 09/27/2022   S/P gastric sleeve procedure 05/21/2022   Hx of heart artery stent 04/18/2022   Type 2 diabetes mellitus without complication, without long-term current use of insulin ([place]) 04/18/2022   Idiopathic urticaria 03/06/2022   Angioneurotic edema 03/06/2022   Chronic arthralgias of knees and hips 06/29/2021   Hx of non-ST elevation myocardial infarction (NSTEMI) 05/03/2021   Tobacco abuse 05/03/2021   Pure hypercholesterolemia    Diet-controlled diabetes mellitus ([place]) 04/17/2019   Primary hypertension 04/07/2019   Swelling 03/24/2019   Morbid obesity ([place]) 03/24/2019   Seasonal allergies 03/24/2019    Past Surgical History:  Procedure Laterality Date   COLPOSCOPY  09/28/2009   CORONARY STENT INTERVENTION N/A 05/04/2021    Procedure: CORONARY STENT INTERVENTION;  Surgeon: Wellington Hampshire, MD;  Location: [facility];  Service: Cardiovascular;  Laterality: N/A;   GASTRIC BYPASS  04/2022   LEFT HEART CATH AND CORONARY ANGIOGRAPHY N/A 05/04/2021   Procedure: LEFT HEART CATH AND CORONARY ANGIOGRAPHY;  Surgeon: Wellington Hampshire, MD;  Location: [facility];  Service: Cardiovascular;  Laterality: N/A;   TONSILLECTOMY AND ADENOIDECTOMY     TYMPANOSTOMY TUBE PLACEMENT Bilateral     Family History  Problem Relation Age of Onset   Healthy Mother    Diabetes Father    Breast cancer Maternal Grandmother        late years   Heart attack Maternal Grandfather        60   Diabetes Paternal Grandfather     Social History   Socioeconomic History   Marital status: Married    Spouse name: Not on file   Number of children: 3   Years of education: 12   Highest education level: Not on file  Occupational History   Occupation: unemployed   Occupation: Scientist, 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: [employer]  Tobacco Use   Smoking status: Former    Packs/day: 1.00    Years: 10.00    Additional pack years: 0.00    Total pack years: 10.00    Types: Cigarettes    Quit date: 05/03/2021    Years since quitting: 1.6   Smokeless tobacco: Never  Vaping Use   Vaping Use: Former   Quit date: 03/19/2022  Substance and Sexual Activity   Alcohol use: No   Drug use: No   Sexual activity: Yes    Partners: Male    Birth control/protection: Surgical, I.U.D.    Comment: Mirena/Vasectomy  Other Topics Concern   Not on file  Social History Narrative   Three kiddos   84, 15, 75    Oldest boy, two younger girls   Social Determinants of Health   Financial Resource Strain: Low Risk  (09/05/2021)   Overall Financial Resource Strain (CARDIA)    Difficulty of Paying Living Expenses: Not hard at all  Food Insecurity: No Food Insecurity (09/05/2021)   Hunger Vital Sign    Worried About Running Out of Food in the Last  Year: Never true    Ran Out of Food in the Last Year: Never true  Transportation Needs: No Transportation Needs (09/05/2021)   PRAPARE - Hydrologist (Medical): No    Lack of Transportation (Non-Medical): No  Physical Activity: Not on file  Stress: Not on file  Social Connections: Not on file  Intimate Partner Violence: Not on file    Outpatient Medications Prior to Visit  Medication Sig Dispense Refill   amoxicillin (AMOXIL) 500 MG capsule Take 1 capsule (500 mg total) by mouth 2 (two) times daily for 10 days. 20 capsule 0   aspirin 81 MG chewable tablet Chew 1 tablet (81 mg total) by mouth daily. 30 tablet 0   EPINEPHrine 0.3 mg/0.3 mL IJ SOAJ injection Inject 0.3 mg into the muscle as needed for anaphylaxis. 1 each 0   levonorgestrel (MIRENA) 20 MCG/DAY IUD 1 each by Intrauterine route once.     ticagrelor (BRILINTA) 60 MG TABS tablet Take 1 tablet (60 mg total) by mouth 2 (two) times daily. 180 tablet 3   triamcinolone cream (KENALOG) 0.1 % Apply 1 Application topically 2 (two) times daily. 30 g 0   valACYclovir (VALTREX) 1000 MG tablet Take two tablets po bid for one dose prn outbreak 30 tablet 0   No facility-administered medications prior to visit.      ROS:  Review of Systems  Constitutional:  Negative for fever.  Gastrointestinal:  Negative for blood in stool, constipation, diarrhea, nausea and vomiting.  Genitourinary:  Positive for menstrual problem. Negative for dyspareunia, dysuria, flank pain, frequency, hematuria, urgency, vaginal bleeding, vaginal discharge and vaginal pain.  Musculoskeletal:  Negative for back pain.  Skin:  Negative for rash.   BREAST: No symptoms   OBJECTIVE:   Vitals:  BP 110/78   Ht 5\' 5"  (1.651 m)   Wt 212 lb (96.2 kg)   LMP 12/12/2022 (Exact Date)   BMI 35.28 kg/m   Physical Exam Vitals reviewed.  Constitutional:      Appearance: She is well-developed.  Pulmonary:     Effort: Pulmonary effort is  normal.  Genitourinary:    General: Normal vulva.     Pubic Area: No rash.      Labia:        Right: No rash, tenderness or lesion.        Left: No rash, tenderness or lesion.      Vagina: Normal. No vaginal discharge, erythema or tenderness.     Cervix: Normal.     Uterus: Normal. Not enlarged and not tender.      Adnexa: Right adnexa normal and left adnexa normal.       Right: No mass or tenderness.         Left:  No mass or tenderness.       Comments: IUD STRINGS IN CX OS Musculoskeletal:        General: Normal range of motion.     Cervical back: Normal range of motion.  Skin:    General: Skin is warm and dry.  Neurological:     General: No focal deficit present.     Mental Status: She is alert and oriented to person, place, and time.  Psychiatric:        Mood and Affect: Mood normal.        Behavior: Behavior normal.        Thought Content: Thought content normal.        Judgment: Judgment normal.     Results: Results for orders placed or performed in visit on 01/07/23 (from the past 24 hour(s))  POCT urine pregnancy     Status: Normal   Collection Time: 01/07/23  3:19 PM  Result Value Ref Range   Preg Test, Ur Negative Negative     Assessment/Plan: Abnormal uterine bleeding (AUB) - Plan: US PELVIC COMPLETE WITH TRANSVAGINAL, POCT urine pregnancy; for 1 month on IUD. Neg UPT, check GYN u/s. If neg, most likely due to significant wt loss. If sx persist, will check with cardio about adding POPs for cycle control.   Encounter for routine checking of intrauterine contraceptive device (IUD) - Plan: US PELVIC COMPLETE WITH TRANSVAGINAL     Return in about 6 months (around [id number]) for annual.  Gayle Collard B. Wylee Ogden, PA-C 01/07/2023 4:10 PM

## 2023-01-07 NOTE — Patient Instructions (Signed)
I value your feedback and you entrusting us with your care. If you get a [facility] patient survey, I would appreciate you taking the time to let us know about your experience today. Thank you! ? ? ?

## 2023-01-13 NOTE — Progress Notes (Signed)
Cardiology Office Note:    Date:  01/14/2023   ID:  Holly Garner, DOB [birth year], MRN [id number]  PCP:  Eugenia Pancoast, [place] Providers Cardiologist:  None     Referring MD: Eugenia Pancoast, FNP   CC: follow up for discontinuation of Brilinta   History of Present Illness:    Holly Garner is a 42 y.o. female with a hx of CAD (PCI 04/2021 DES to occluded RCA), hypertension, DM 2, tobacco abuse, HLD, S/p gastric sleeve procedure (2023), polyarthralgia.   She had presented to the hospital with a three week hx of CP. EKG showed T wave inversion with elevated troponin. CT angio was unremarkable. She eventually left AMA prior to [facility]. She presented the following day with worsening chest pain, troponin peaked at 7355. Echo showed an EF on 60-65%. LHC showed revealed moderate diffuse multivessel disease with a  total occlusion of the distal RCA, treated with a DES. Discharged on ASA, Brilinta, metoprolol, atorvastatin.   Most recently she was evaluated by Dr. Rockey Situ on 07/20/22, at that time she was doing well from a cardiac perspective. She had undergone bypass surgery a few months prior and had lost 50 pounds. Her Brilinta was decreased to 60 mg daily and she was continued on her ASA.   She presents today with concerns of menorrhagia that has been occurring for ~ 4 weeks. She was evaluated by her PCP and was suggested that she follow up with cardiology for consideration of dosage change on her Brilinta since she has lost weight. She has lost ~ 140 since she started her weight loss journey. She denies chest pain, palpitations, dyspnea, pnd, orthopnea, n, v, dizziness, syncope, edema, weight gain, or early satiety.    Past Medical History:  Diagnosis Date   BMI 50.0-59.9, adult ([place])    CAD (coronary artery disease)    a. 04/2021 NSTEMI/PCI: LM nl, LAD 8m, 30d, D1 nl, LCX 84m, OM1 min irregs, RCA 20p, 30p/m, 100d (2.5x26 Resolute Onyx DES).   Essential hypertension     High cholesterol    History of echocardiogram    a. 04/2021 Echo: EF 60-65%. No rwma. Nl RV fxn.   History of Papanicolaou smear of cervix 04/2011   normal per pt.   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   hgsil   Tobacco abuse    Type II diabetes mellitus ([place])     Past Surgical History:  Procedure Laterality Date   COLPOSCOPY  09/28/2009   CORONARY STENT INTERVENTION N/A 05/04/2021   Procedure: CORONARY STENT INTERVENTION;  Surgeon: Wellington Hampshire, MD;  Location: [place] [facility];  Service: Cardiovascular;  Laterality: N/A;   GASTRIC BYPASS  04/2022   LEFT HEART CATH AND CORONARY ANGIOGRAPHY N/A 05/04/2021   Procedure: LEFT HEART CATH AND CORONARY ANGIOGRAPHY;  Surgeon: Wellington Hampshire, MD;  Location: [facility];  Service: Cardiovascular;  Laterality: N/A;   TONSILLECTOMY AND ADENOIDECTOMY     TYMPANOSTOMY TUBE PLACEMENT Bilateral     Current Medications: Current Meds  Medication Sig   aspirin 81 MG chewable tablet Chew 1 tablet (81 mg total) by mouth daily.   EPINEPHrine 0.3 mg/0.3 mL IJ SOAJ injection Inject 0.3 mg into the muscle as needed for anaphylaxis.   levonorgestrel (MIRENA) 20 MCG/DAY IUD 1 each by Intrauterine route once.   ticagrelor (BRILINTA) 60 MG TABS tablet Take 1 tablet (60 mg total) by mouth 2 (two) times daily.   triamcinolone  cream (KENALOG) 0.1 % Apply 1 Application topically 2 (two) times daily.   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    Comment: Mirena/Vasectomy  Other Topics Concern   Not on file  Social History Narrative   Three kiddos   73, 66, 66    Oldest boy, two younger girls   Social Determinants of Health   Financial Resource Strain: Low Risk  (09/05/2021)   Overall Financial Resource Strain (CARDIA)    Difficulty of Paying Living Expenses: Not hard at all  Food Insecurity: No Food Insecurity (09/05/2021)   Hunger Vital Sign    Worried About Running Out of Food in the Last Year: Never true    Ran Out of Food in the Last Year: Never true  Transportation Needs: No Transportation Needs (09/05/2021)   PRAPARE - Hydrologist (Medical): No    Lack of Transportation (Non-Medical): No  Physical Activity: Not on file  Stress: Not on file  Social Connections: Not on file     Family History: The patient's family history includes Breast cancer in her maternal grandmother; Diabetes in her father and paternal grandfather; Healthy in her mother; Heart attack in her maternal grandfather.  ROS:   Please see the history of present illness.     All other systems reviewed and are negative.  EKGs/Labs/Other Studies Reviewed:    The following studies were reviewed today:  05/04/2021 left heart cath   Mid Cx lesion is 60% stenosed.   Mid LAD lesion is 60% stenosed.   Dist LAD lesion is 30% stenosed.   Prox RCA lesion is 20% stenosed.   Prox RCA to Mid RCA lesion is 30% stenosed.   Dist RCA lesion is 100% stenosed.   A drug-eluting stent was successfully  placed using a STENT RESOLUTE ONYX 2.5X26.   Post intervention, there is a 0% residual stenosis.   1.  Borderline three-vessel coronary artery disease.  The culprit for non-STEMI is an occluded distal right coronary artery.  In addition, there is moderate mid LAD and mid left circumflex disease estimated to be 60% each. 2.  Left ventricular angiography was not performed.  EF was low normal by echo. 3.  Moderately to severely elevated left ventricular end-diastolic pressure but this was after the patient received aggressive IV hydration precath. 4.  Successful angioplasty and drug-eluting stent placement to the right coronary artery.  Recommendations:  Dual antiplatelet therapy for at least 12 months. Aggressive treatment of risk factors. Possible discharge home tomorrow if no issues.  05/04/2021 echo complete -EF of 60 to 65%, no valvular abnormalities    EKG:  EKG is  ordered today.  The ekg ordered today demonstrates NSR, HR 76 bpm, consistent with prior EKG tracings.   Recent Labs: No results found for requested labs within last 365 days.  Recent Lipid Panel    Component Value Date/Time   CHOL 118 08/22/2021 0910   CHOL 163 06/15/2021 1547   TRIG 89.0 08/22/2021 0910   HDL 35.60 (L) 08/22/2021 0910   HDL 36 (L) 06/15/2021 1547   CHOLHDL 3 08/22/2021 0910   VLDL 17.8 08/22/2021 0910   LDLCALC 65 08/22/2021 0910   LDLCALC 105 (H) 06/15/2021 1547     Risk Assessment/Calculations:                Physical Exam:    VS:  BP 108/70 (BP Location: Left Arm, Patient Position: Sitting, Cuff Size: Normal)   Pulse 76   Ht 5\' 5"  (1.651 m)   Wt 212 lb 3.2 oz (96.3 kg)   LMP 12/12/2022 (Exact Date)   SpO2 98%   BMI 35.31 kg/m     Wt Readings from Last 3 Encounters:  01/14/23 212 lb 3.2 oz (96.3 kg)  01/07/23 212 lb (96.2 kg)  01/02/23 214 lb 6.4 oz (97.3 kg)     GEN:  Well nourished, well developed in no acute distress HEENT: Normal NECK: No JVD; No carotid  bruits LYMPHATICS: No lymphadenopathy CARDIAC: RRR, no murmurs, rubs, gallops RESPIRATORY:  Clear to auscultation without rales, wheezing or rhonchi  ABDOMEN: Soft, non-tender, non-distended MUSCULOSKELETAL:  No edema; No deformity  SKIN: Warm and dry NEUROLOGIC:  Alert and oriented x 3 PSYCHIATRIC:  Normal affect   ASSESSMENT:    1. Coronary artery disease of native artery of native heart with stable angina pectoris ([place])   2. Type 2 diabetes mellitus with other specified complication, without long-term current use of insulin ([place])   3. Primary hypertension   4. Hyperlipidemia LDL goal <70   5. Menorrhagia with irregular cycle    PLAN:    In order of problems listed above:  CAD - left heart cath in August 2022; s/p DES to RCA, moderate mid LAD and mid left circumflex 60% stenosed. Stable with no anginal symptoms. No indication for ischemic evaluation.  She is concerned about prolonged menstrual cycle and curious if she can stop her Brilinta. Will discuss with Dr. Fletcher Anon.   DM2 - A1C on 07/02/22 was 5.6%, due to her weight loss she has been able to stop her antidiabetic medications.   HTN - BP is well controlled today at 108/70.   HLD - LDL on 08/22/21 was managed at 65, however she has tried both atorvastatin and rosuvastatin and was unable to tolerate,  will repeat direct LDL today.   Menorrhagia - persistent ~ 4 weeks. Will check with Dr. Fletcher Anon to see if she can stop her Brilinta. Will check CBC today.  Disposition - CBC, direct LDL, keep f/u appt with Dr. Rockey Situ in September.            Medication Adjustments/Labs and Tests Ordered: Current medicines are reviewed at length with the patient today.  Concerns regarding medicines are outlined above.  Orders Placed This Encounter  Procedures   CBC   LDL cholesterol, direct   EKG 12-Lead   No orders of  the defined types were placed in this encounter.   Patient Instructions  Medication Instructions:  Your physician  recommends that you continue on your current medications as directed. Please refer to the Current Medication list given to you today.  *If you need a refill on your cardiac medications before your next appointment, please call your pharmacy*  Lab Work: Direct LDL and CBC  - Please go to the [facility]. You will check in at the front desk to the right as you walk into the atrium. Valet Parking is offered if needed. - No appointment needed. You may go any day between 7 am and 6 pm.  If you have labs (blood work) drawn today and your tests are completely normal, you will receive your results only by: [place] (if you have MyChart) OR A paper copy in the mail If you have any lab test that is abnormal or we need to change your treatment, we will call you to review the results.   Testing/Procedures: No testing ordered  Follow-Up: At [facility], you and your health needs are our priority.  As part of our continuing mission to provide you with exceptional heart care, we have created designated Provider Care Teams.  These Care Teams include your primary Cardiologist (physician) and Advanced Practice Providers (APPs -  Physician Assistants and Nurse Practitioners) who all work together to provide you with the care you need, when you need it.  We recommend signing up for the patient portal called "MyChart".  Sign up information is provided on this After Visit Summary.  MyChart is used to connect with patients for Virtual Visits (Telemedicine).  Patients are able to view lab/test results, encounter notes, upcoming appointments, etc.  Non-urgent messages can be sent to your provider as well.   To learn more about what you can do with MyChart, go to [email or web address].    Your next appointment:   As scheduled on 07/12/23  Provider:   Ida Rogue, MD    Signed, Trudi Ida, NP  01/14/2023 2:54 PM    [place]

## 2023-01-14 ENCOUNTER — Encounter: Payer: Self-pay | Admitting: Physician Assistant

## 2023-01-14 ENCOUNTER — Ambulatory Visit: Payer: Medicaid Other | Attending: Physician Assistant | Admitting: Cardiology

## 2023-01-14 ENCOUNTER — Other Ambulatory Visit
Admission: RE | Admit: 2023-01-14 | Discharge: 2023-01-14 | Disposition: A | Discharge status: Home / Self Care | Payer: Medicaid Other | Source: Ambulatory Visit | Attending: Cardiology | Admitting: Cardiology

## 2023-01-14 VITALS — BP 108/70 | HR 76 | Ht 65.0 in | Wt 212.2 lb

## 2023-01-14 DIAGNOSIS — I10 Essential (primary) hypertension: Secondary | ICD-10-CM

## 2023-01-14 DIAGNOSIS — E785 Hyperlipidemia, unspecified: Secondary | ICD-10-CM | POA: Insufficient documentation

## 2023-01-14 DIAGNOSIS — I25118 Atherosclerotic heart disease of native coronary artery with other forms of angina pectoris: Secondary | ICD-10-CM | POA: Diagnosis not present

## 2023-01-14 DIAGNOSIS — N921 Excessive and frequent menstruation with irregular cycle: Secondary | ICD-10-CM | POA: Insufficient documentation

## 2023-01-14 DIAGNOSIS — E1169 Type 2 diabetes mellitus with other specified complication: Secondary | ICD-10-CM | POA: Diagnosis not present

## 2023-01-14 LAB — CBC
HCT: 38.1 % (ref 36.0–46.0)
Hemoglobin: 12.1 g/dL (ref 12.0–15.0)
MCH: 25.6 pg — ABNORMAL LOW (ref 26.0–34.0)
MCHC: 31.8 g/dL (ref 30.0–36.0)
MCV: 80.7 fL (ref 80.0–100.0)
Platelets: 337 K/uL (ref 150–400)
RBC: 4.72 MIL/uL (ref 3.87–5.11)
RDW: 15 % (ref 11.5–15.5)
WBC: 9.7 K/uL (ref 4.0–10.5)
nRBC: 0 % (ref 0.0–0.2)

## 2023-01-14 LAB — LDL CHOLESTEROL, DIRECT: Direct LDL: 111 mg/dL — ABNORMAL HIGH (ref 0–99)

## 2023-01-14 NOTE — Patient Instructions (Addendum)
Medication Instructions:  Your physician recommends that you continue on your current medications as directed. Please refer to the Current Medication list given to you today.  *If you need a refill on your cardiac medications before your next appointment, please call your pharmacy*  Lab Work: Direct LDL and CBC  - Please go to the [facility]. You will check in at the front desk to the right as you walk into the atrium. Valet Parking is offered if needed. - No appointment needed. You may go any day between 7 am and 6 pm.  If you have labs (blood work) drawn today and your tests are completely normal, you will receive your results only by: [place] (if you have MyChart) OR A paper copy in the mail If you have any lab test that is abnormal or we need to change your treatment, we will call you to review the results.   Testing/Procedures: No testing ordered  Follow-Up: At [facility], you and your health needs are our priority.  As part of our continuing mission to provide you with exceptional heart care, we have created designated Provider Care Teams.  These Care Teams include your primary Cardiologist (physician) and Advanced Practice Providers (APPs -  Physician Assistants and Nurse Practitioners) who all work together to provide you with the care you need, when you need it.  We recommend signing up for the patient portal called "MyChart".  Sign up information is provided on this After Visit Summary.  MyChart is used to connect with patients for Virtual Visits (Telemedicine).  Patients are able to view lab/test results, encounter notes, upcoming appointments, etc.  Non-urgent messages can be sent to your provider as well.   To learn more about what you can do with MyChart, go to [email or web address].    Your next appointment:   As scheduled on 07/12/23  Provider:   Ida Rogue, MD

## 2023-01-15 ENCOUNTER — Telehealth: Payer: Self-pay | Admitting: Cardiology

## 2023-01-15 ENCOUNTER — Other Ambulatory Visit: Payer: Self-pay | Admitting: *Deleted

## 2023-01-15 DIAGNOSIS — I25118 Atherosclerotic heart disease of native coronary artery with other forms of angina pectoris: Secondary | ICD-10-CM

## 2023-01-15 DIAGNOSIS — E7800 Pure hypercholesterolemia, unspecified: Secondary | ICD-10-CM

## 2023-01-15 DIAGNOSIS — E785 Hyperlipidemia, unspecified: Secondary | ICD-10-CM

## 2023-01-15 DIAGNOSIS — I2510 Atherosclerotic heart disease of native coronary artery without angina pectoris: Secondary | ICD-10-CM

## 2023-01-15 NOTE — Telephone Encounter (Signed)
Patient called stating she just missed a call, no message was left on her VM mail.

## 2023-01-15 NOTE — Telephone Encounter (Signed)
Please let her know that per Dr. Fletcher Anon, she can stop her Brilinta and continue with her aspirin therapy.   Thank you, Anderson Malta

## 2023-01-16 NOTE — Telephone Encounter (Signed)
See other telephone encounter.

## 2023-01-16 NOTE — Telephone Encounter (Signed)
Reviewed results and recommendations with patient and also information on her Brilinta. She verbalized understanding with no further questions at this time.    Trudi Ida, NP at 01/15/2023  4:49 PM  Status: Signed  Please let her know that per Dr. Fletcher Anon, she can stop her Brilinta and continue with her aspirin therapy.   Thank you, Anderson Malta

## 2023-01-16 NOTE — Telephone Encounter (Signed)
-----   Message from Trudi Ida, NP sent at 01/15/2023  2:54 PM EDT ----- Holly Garner, Your CBC did not show any indication of anemia from blood loss.  Your cholesterol is elevated in spite of your weight loss. Since you have been intolerant of statin therapy and we need to keep your bad cholesterol down, I am going to refer you to our pharmacy lipid clinic to see if we can help get your bad cholesterol down to protect your stent.   I am still waiting to hear back from Dr. Fletcher Anon about stopping your Brilinta, and will reach out as soon as I hear back from him.  Best, Anderson Malta

## 2023-02-21 DIAGNOSIS — L729 Follicular cyst of the skin and subcutaneous tissue, unspecified: Secondary | ICD-10-CM | POA: Diagnosis not present

## 2023-02-25 ENCOUNTER — Ambulatory Visit: Payer: Medicaid Other

## 2023-03-17 DIAGNOSIS — R21 Rash and other nonspecific skin eruption: Secondary | ICD-10-CM | POA: Diagnosis not present

## 2023-03-28 ENCOUNTER — Ambulatory Visit: Payer: Medicaid Other | Attending: Cardiology

## 2023-03-28 NOTE — Progress Notes (Deleted)
   Office Visit    Patient Name: Holly Garner Date of Encounter: 03/28/2023  Primary Care Provider:  Mort Sawyers, FNP Primary Cardiologist:  Julien Nordmann MD  Chief Complaint    Hyperlipidemia   Significant Past Medical History   HTN   DM2   Obesity   Tobacco abuse         Allergies  Allergen Reactions   Metoprolol Hives   Omeprazole Hives   Statins Hives, Itching and Other (See Comments)    Other reaction(s): Unknown    Atorvastatin Hives and Itching   Rosuvastatin Hives and Itching   Contrast Media [Iodinated Contrast Media] Itching    History of Present Illness    Holly Garner is a 42 y.o. female patient of Dr ***,  Community education officer [place]:   LDL Cholesterol goal:    Current Medications:     Previously tried:    Family Hx:     Social Hx: Tobacco: Alcohol:      Diet:      Exercise:   Adherence Assessment  Do you ever forget to take your medication? [] Yes [] No  Do you ever skip doses due to side effects? [] Yes [] No  Do you have trouble affording your medicines? [] Yes [] No  Are you ever unable to pick up your medication due to transportation difficulties? [] Yes [] No  Do you ever stop taking your medications because you don't believe they are helping? [] Yes [] No  Do you check your weight daily? [] Yes [] No   Adherence strategy: ***  Barriers to obtaining medications: ***     Accessory Clinical Findings   Lab Results  Component Value Date   CHOL 118 08/22/2021   HDL 35.60 (L) 08/22/2021   LDLCALC 65 08/22/2021   LDLDIRECT 111 (H) 01/14/2023   TRIG 89.0 08/22/2021   CHOLHDL 3 08/22/2021    Lab Results  Component Value Date   ALT 14 08/22/2021   AST 13 08/22/2021   ALKPHOS 113 08/22/2021   BILITOT 0.4 08/22/2021   Lab Results  Component Value Date   CREATININE 0.67 12/19/2021   BUN 20 12/19/2021   NA 142 12/19/2021   K 3.7 12/19/2021   CL 102 12/19/2021   CO2 27 12/19/2021   Lab Results  Component Value Date    HGBA1C 5.6 07/02/2022    Home Medications    Current Outpatient Medications  Medication Sig Dispense Refill   aspirin 81 MG chewable tablet Chew 1 tablet (81 mg total) by mouth daily. 30 tablet 0   EPINEPHrine 0.3 mg/0.3 mL IJ SOAJ injection Inject 0.3 mg into the muscle as needed for anaphylaxis. 1 each 0   levonorgestrel (MIRENA) 20 MCG/DAY IUD 1 each by Intrauterine route once.     triamcinolone cream (KENALOG) 0.1 % Apply 1 Application topically 2 (two) times daily. 30 g 0   valACYclovir (VALTREX) 1000 MG tablet Take two tablets po bid for one dose prn outbreak 30 tablet 0   No current facility-administered medications for this visit.     Assessment & Plan    No problem-specific Assessment & Plan notes found for this encounter.   Buchholz Hay, PharmD CPP [facility] [address]  [place], [place] [address] [phone]  03/28/2023, 7:45 AM

## 2023-03-29 ENCOUNTER — Encounter: Payer: Self-pay | Admitting: Cardiovascular Disease

## 2023-04-05 ENCOUNTER — Ambulatory Visit: Payer: Medicaid Other | Admitting: Primary Care

## 2023-04-05 ENCOUNTER — Encounter: Payer: Self-pay | Admitting: Primary Care

## 2023-04-05 VITALS — BP 124/78 | HR 67 | Temp 98.8°F | Ht 65.0 in | Wt 202.0 lb

## 2023-04-05 DIAGNOSIS — R21 Rash and other nonspecific skin eruption: Secondary | ICD-10-CM

## 2023-04-05 DIAGNOSIS — Z903 Acquired absence of stomach [part of]: Secondary | ICD-10-CM

## 2023-04-05 DIAGNOSIS — E7800 Pure hypercholesterolemia, unspecified: Secondary | ICD-10-CM

## 2023-04-05 DIAGNOSIS — E119 Type 2 diabetes mellitus without complications: Secondary | ICD-10-CM | POA: Diagnosis not present

## 2023-04-05 LAB — LIPID PANEL
Cholesterol: 148 mg/dL (ref 0–200)
HDL: 36.8 mg/dL — ABNORMAL LOW (ref >39.00)
LDL Cholesterol: 95 mg/dL (ref 0–99)
NonHDL: 110.97
Total CHOL/HDL Ratio: 4
Triglycerides: 81 mg/dL (ref 0.0–149.0)
VLDL: 16.2 mg/dL (ref 0.0–40.0)

## 2023-04-05 LAB — COMPREHENSIVE METABOLIC PANEL
ALT: 22 U/L (ref 0–35)
AST: 18 U/L (ref 0–37)
Albumin: 4 g/dL (ref 3.5–5.2)
Alkaline Phosphatase: 100 U/L (ref 39–117)
BUN: 14 mg/dL (ref 6–23)
CO2: 29 mEq/L (ref 19–32)
Calcium: 9.1 mg/dL (ref 8.4–10.5)
Chloride: 103 mEq/L (ref 96–112)
Creatinine, Ser: 0.49 mg/dL (ref 0.40–1.20)
GFR: 118.56 mL/min (ref >60.00)
Glucose, Bld: 88 mg/dL (ref 70–99)
Potassium: 4.3 mEq/L (ref 3.5–5.1)
Sodium: 139 mEq/L (ref 135–145)
Total Bilirubin: 0.3 mg/dL (ref 0.2–1.2)
Total Protein: 6.5 g/dL (ref 6.0–8.3)

## 2023-04-05 LAB — TSH: TSH: 1.57 u[IU]/mL (ref 0.35–5.50)

## 2023-04-05 LAB — IBC + FERRITIN
Ferritin: 12.9 ng/mL (ref 10.0–291.0)
Iron: 40 ug/dL — ABNORMAL LOW (ref 42–145)
Saturation Ratios: 12.1 % — ABNORMAL LOW (ref 20.0–50.0)
TIBC: 331.8 ug/dL (ref 250.0–450.0)
Transferrin: 237 mg/dL (ref 212.0–360.0)

## 2023-04-05 LAB — HEMOGLOBIN A1C: Hgb A1c MFr Bld: 5.6 % (ref 4.6–6.5)

## 2023-04-05 LAB — CBC
HCT: 37.7 % (ref 36.0–46.0)
Hemoglobin: 11.9 g/dL — ABNORMAL LOW (ref 12.0–15.0)
MCHC: 31.5 g/dL (ref 30.0–36.0)
MCV: 81.7 fl (ref 78.0–100.0)
Platelets: 319 10*3/uL (ref 150.0–400.0)
RBC: 4.62 Mil/uL (ref 3.87–5.11)
RDW: 15.2 % (ref 11.5–15.5)
WBC: 8 10*3/uL (ref 4.0–10.5)

## 2023-04-05 LAB — VITAMIN B12: Vitamin B-12: 473 pg/mL (ref 211–911)

## 2023-04-05 LAB — VITAMIN D 25 HYDROXY (VIT D DEFICIENCY, FRACTURES): VITD: 34.95 ng/mL (ref 30.00–100.00)

## 2023-04-05 MED ORDER — PERMETHRIN 5 % EX CREA
1.0000 | TOPICAL_CREAM | Freq: Once | CUTANEOUS | 0 refills | Status: AC
Start: 2023-04-05 — End: 2023-04-05

## 2023-04-05 NOTE — Patient Instructions (Signed)
Apply the 1/2 tube of the permethrin cream from the neck down to the soles of the feet. Leave on for 8-12 hours, then wash off in the shower.   Stop by the lab prior to leaving today. I will notify you of your results once received.   Follow up with dermatology as scheduled.   It was a pleasure to see you today!

## 2023-04-05 NOTE — Progress Notes (Signed)
Subjective:    Patient ID: Holly Garner, female    DOB: 1980-12-31, 42 y.o.   MRN: [id number]  HPI  Holly Garner is a very pleasant 42 y.o. female patient of Tabitha, NP with a history of hypertension, type 2 diabetes, NSTEMI, morbid obesity status post gastric sleeve procedure, polyarthralgia tobacco use who presents today to discuss rash.  She would also like her labs checked including iron, vitamin levels, A1C, lipids.   Symptom onset 2 weeks ago with "red bumps" to the right upper extremity with itching. Since then she's noticed the same bumps to the left upper extremity and bilateral dorsal hands. Her rash is itchy.   She works at [employer] in [place], she checked in and was treated with prednisone and steroid cream which made her blisters worse. She was told that her rash could be "autoimmune".   No one else in her home is itching. She's tried OTC Cortisone and Benadryl without improvement. She has an appointment with dermatology scheduled for next week. Se has seen an allergist in the past, tested positive for allergies to mold, dust mites, and grasses. She's torn apart her mattress at home, hasn't seen any insects.   She denies joint pain.   She underwent bariatric surgery in July 2023, has lost 160 pounds since then.   No new lotions, detergents, soaps or shampoos. No new medicines, vitamins, supplements. No new pets. No recent outdoor exposure or poison ivy exposure. No bonfire or smoke exposure.  No recent motel or hotel stay or new beds.   No fevers/chills, oral lesions, new joint pains, tick bites, abdominal pain, nausea.   Wt Readings from Last 3 Encounters:  04/05/23 202 lb (91.6 kg)  01/14/23 212 lb 3.2 oz (96.3 kg)  01/07/23 212 lb (96.2 kg)      Review of Systems  Constitutional:  Negative for fever.  Gastrointestinal:  Negative for nausea and vomiting.  Skin:  Positive for rash.  Hematological:  Negative for adenopathy.         Past  Medical History:  Diagnosis Date   BMI 50.0-59.9, adult (HCC)    CAD (coronary artery disease)    a. 04/2021 NSTEMI/PCI: LM nl, LAD 93m, 30d, D1 nl, LCX 22m, OM1 min irregs, RCA 20p, 30p/m, 100d (2.5x26 Resolute Onyx DES).   Essential hypertension    High cholesterol    History of echocardiogram    a. 04/2021 Echo: EF 60-65%. No rwma. Nl RV fxn.   History of Papanicolaou smear of cervix 04/2011   normal per pt.   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   hgsil   Tobacco abuse    Type II diabetes mellitus (HCC)     Social History   Socioeconomic History   Marital status: Married    Spouse name: Not on file   Number of children: 3   Years of education: 12   Highest education level: Some college, no degree  Occupational History   Occupation: unemployed   Occupation: Dentist: [employer]  Tobacco Use   Smoking status: Former    Packs/day: 1.00    Years: 10.00    Additional pack years: 0.00    Total pack years: 10.00    Types: Cigarettes    Quit date: 05/03/2021    Years since quitting: 1.9   Smokeless tobacco: Never  Vaping Use   Vaping Use: Former   Quit date: 03/19/2022  Substance and Sexual Activity  Alcohol use: No   Drug use: No   Sexual activity: Yes    Partners: Male    Birth control/protection: Surgical, I.U.D.    Comment: Mirena/Vasectomy  Other Topics Concern   Not on file  Social History Narrative   Three kiddos   30, 69, 28    Oldest boy, two younger girls   Social Determinants of Health   Financial Resource Strain: Low Risk  (04/03/2023)   Overall Financial Resource Strain (CARDIA)    Difficulty of Paying Living Expenses: Not hard at all  Food Insecurity: No Food Insecurity (04/03/2023)   Hunger Vital Sign    Worried About Running Out of Food in the Last Year: Never true    Ran Out of Food in the Last Year: Never true  Transportation Needs: No Transportation Needs (04/03/2023)   PRAPARE - Therapist, art (Medical): No    Lack of Transportation (Non-Medical): No  Physical Activity: Sufficiently Active (04/03/2023)   Exercise Vital Sign    Days of Exercise per Week: 7 days    Minutes of Exercise per Session: 60 min  Stress: No Stress Concern Present (04/03/2023)   [employer] of Occupational Health - Occupational Stress Questionnaire    Feeling of Stress : Not at all  Social Connections: Moderately Integrated (04/03/2023)   Social Connection and Isolation Panel [NHANES]    Frequency of Communication with Friends and Family: More than three times a week    Frequency of Social Gatherings with Friends and Family: More than three times a week    Attends Religious Services: 1 to 4 times per year    Active Member of [employer] or Organizations: No    Attends Banker Meetings: Not on file    Marital Status: Married  Catering manager Violence: Not on file    Past Surgical History:  Procedure Laterality Date   COLPOSCOPY  09/28/2009   CORONARY STENT INTERVENTION N/A 05/04/2021   Procedure: CORONARY STENT INTERVENTION;  Surgeon: Iran Ouch, MD;  Location: [facility];  Service: Cardiovascular;  Laterality: N/A;   GASTRIC BYPASS  04/2022   LEFT HEART CATH AND CORONARY ANGIOGRAPHY N/A 05/04/2021   Procedure: LEFT HEART CATH AND CORONARY ANGIOGRAPHY;  Surgeon: Iran Ouch, MD;  Location: [facility];  Service: Cardiovascular;  Laterality: N/A;   TONSILLECTOMY AND ADENOIDECTOMY     TYMPANOSTOMY TUBE PLACEMENT Bilateral     Family History  Problem Relation Age of Onset   Healthy Mother    Diabetes Father    Breast cancer Maternal Grandmother        late years   Heart attack Maternal Grandfather        46   Diabetes Paternal Grandfather     Allergies  Allergen Reactions   Metoprolol Hives   Omeprazole Hives   Statins Hives, Itching and Other (See Comments)    Other reaction(s): Unknown    Atorvastatin Hives and Itching    Rosuvastatin Hives and Itching   Contrast Media [Iodinated Contrast Media] Itching    Current Outpatient Medications on File Prior to Visit  Medication Sig Dispense Refill   aspirin 81 MG chewable tablet Chew 1 tablet (81 mg total) by mouth daily. 30 tablet 0   EPINEPHrine 0.3 mg/0.3 mL IJ SOAJ injection Inject 0.3 mg into the muscle as needed for anaphylaxis. 1 each 0   levonorgestrel (MIRENA) 20 MCG/DAY IUD 1 each by Intrauterine route once.  valACYclovir (VALTREX) 1000 MG tablet Take two tablets po bid for one dose prn outbreak 30 tablet 0   triamcinolone cream (KENALOG) 0.1 % Apply 1 Application topically 2 (two) times daily. (Patient not taking: Reported on 04/05/2023) 30 g 0   No current facility-administered medications on file prior to visit.    BP 124/78   Pulse 67   Temp 98.8 F (37.1 C) (Temporal)   Ht 5\' 5"  (1.651 m)   Wt 202 lb (91.6 kg)   SpO2 99%   BMI 33.61 kg/m  Objective:   Physical Exam Constitutional:      General: She is not in acute distress. Cardiovascular:     Rate and Rhythm: Normal rate.  Pulmonary:     Effort: Pulmonary effort is normal.  Skin:    General: Skin is warm and dry.     Findings: Rash present.     Comments: Numerous red bumps to bilateral upper extremities, dorsal hands, and fingers in various stages of healing. Some scabbing. Two medium red blisters to fingers. No rash to webbing of hands.   Psychiatric:        Mood and Affect: Mood normal.           Assessment & Plan:  Rash and nonspecific skin eruption Assessment & Plan: Unclear etiology.  Exam very much representative of scabies, however the HPI does not make sense for this. She is desperate for treatment and has failed oral and topical steroids.  Prescription for permethrin cream provided to try.  Discussed instructions for use. She will also follow-up with dermatology as scheduled.  Labs pending to rule out autoimmune cause such as ANA.  Orders: -     ANA -      TSH -     Permethrin; Apply 1 Application topically once for 1 dose.  Dispense: 60 g; Refill: 0  S/P gastric sleeve procedure Assessment & Plan: Commended her on her weight loss!  Vitamin levels pending including B12, D.  Also checking iron levels and CBC.  Orders: -     CBC -     IBC + Ferritin -     Vitamin B12 -     VITAMIN D 25 Hydroxy (Vit-D Deficiency, Fractures)  Diet-controlled diabetes mellitus (HCC) Assessment & Plan: Commended her on weight loss, encouraged to continue! Repeat A1c pending.  Will CC to PCP. Continue off treatment.  Orders: -     Hemoglobin A1c -     Lipid panel -     Comprehensive metabolic panel  Pure hypercholesterolemia Assessment & Plan: Repeat lipid panel pending. Will CC cardiologist         Doreene Nest, NP

## 2023-04-05 NOTE — Assessment & Plan Note (Addendum)
Unclear etiology.  Exam very much representative of scabies, however the HPI does not make sense for this. She is desperate for treatment and has failed oral and topical steroids.  Prescription for permethrin cream provided to try.  Discussed instructions for use. She will also follow-up with dermatology as scheduled.  Labs pending to rule out autoimmune cause such as ANA.

## 2023-04-05 NOTE — Assessment & Plan Note (Signed)
Commended her on her weight loss!  Vitamin levels pending including B12, D.  Also checking iron levels and CBC.

## 2023-04-05 NOTE — Assessment & Plan Note (Signed)
Commended her on weight loss, encouraged to continue! Repeat A1c pending.  Will CC to PCP. Continue off treatment.

## 2023-04-05 NOTE — Assessment & Plan Note (Signed)
Repeat lipid panel pending. Will CC cardiologist

## 2023-04-07 LAB — ANTI-NUCLEAR AB-TITER (ANA TITER): ANA Titer 1: 1:40 {titer} — ABNORMAL HIGH

## 2023-04-07 LAB — ANA: Anti Nuclear Antibody (ANA): POSITIVE — AB

## 2023-04-08 ENCOUNTER — Encounter: Payer: Self-pay | Admitting: Family

## 2023-04-08 DIAGNOSIS — R768 Other specified abnormal immunological findings in serum: Secondary | ICD-10-CM

## 2023-04-08 DIAGNOSIS — R21 Rash and other nonspecific skin eruption: Secondary | ICD-10-CM

## 2023-04-10 DIAGNOSIS — L720 Epidermal cyst: Secondary | ICD-10-CM | POA: Diagnosis not present

## 2023-04-10 DIAGNOSIS — L508 Other urticaria: Secondary | ICD-10-CM | POA: Diagnosis not present

## 2023-04-11 ENCOUNTER — Ambulatory Visit: Payer: Medicaid Other | Admitting: Family

## 2023-04-18 DIAGNOSIS — Z20822 Contact with and (suspected) exposure to covid-19: Secondary | ICD-10-CM | POA: Diagnosis not present

## 2023-04-18 DIAGNOSIS — R070 Pain in throat: Secondary | ICD-10-CM | POA: Diagnosis not present

## 2023-04-18 DIAGNOSIS — R519 Headache, unspecified: Secondary | ICD-10-CM | POA: Diagnosis not present

## 2023-05-13 ENCOUNTER — Encounter: Payer: Self-pay | Admitting: Family

## 2023-05-13 ENCOUNTER — Other Ambulatory Visit: Payer: Self-pay | Admitting: Family

## 2023-05-13 DIAGNOSIS — R21 Rash and other nonspecific skin eruption: Secondary | ICD-10-CM

## 2023-05-13 DIAGNOSIS — L501 Idiopathic urticaria: Secondary | ICD-10-CM

## 2023-05-20 DIAGNOSIS — R509 Fever, unspecified: Secondary | ICD-10-CM | POA: Diagnosis not present

## 2023-05-20 DIAGNOSIS — R070 Pain in throat: Secondary | ICD-10-CM | POA: Diagnosis not present

## 2023-05-20 DIAGNOSIS — Z20822 Contact with and (suspected) exposure to covid-19: Secondary | ICD-10-CM | POA: Diagnosis not present

## 2023-05-20 DIAGNOSIS — U071 COVID-19: Secondary | ICD-10-CM | POA: Diagnosis not present

## 2023-05-29 DIAGNOSIS — T753XXA Motion sickness, initial encounter: Secondary | ICD-10-CM | POA: Diagnosis not present

## 2023-05-29 DIAGNOSIS — H6121 Impacted cerumen, right ear: Secondary | ICD-10-CM | POA: Diagnosis not present

## 2023-07-09 NOTE — Progress Notes (Signed)
Cardiology Office Note  Date:  07/12/2023   ID:  Holly Garner, DOB Dec 04, 1980, MRN [id number]  PCP:  Mort Sawyers, FNP   Chief Complaint  Patient presents with   12 month follow up     "Doing well." Medications reviewed by the patient verbally.     HPI:  42 year old female with a history of  CAD, PCI 04/2021 DES to occluded RCA,  hypertension,  hyperlipidemia,  Morbid obesity,  tobacco abuse quit 2022, quit vaps 2023 diabetes who presents for follow-up of CAD.  Last seen in clinic by myself September 2023 Seen by one of our providers March 2024  July 23 underwent successful gastric bypass Since that time has had dramatic weight loss  She estimates, Lost 172 pounds  Off repatha, LDL 95 Reports that all of her medications were stopped by the gastric bypass surgeon  Lab work reviewed A1c is continue to trend downward, less than 6 Total cholesterol 118 and LDL 65 in November 2022 on repatha LDL up to 95  Denies chest pain concerning for angina  EKG personally reviewed by myself on todays visit EKG Interpretation Date/Time:  Friday July 12 2023 09:08:54 EDT Ventricular Rate:  61 PR Interval:  142 QRS Duration:  98 QT Interval:  396 QTC Calculation: 398 R Axis:   61  Text Interpretation: Normal sinus rhythm Normal ECG When compared with ECG of 04-Jul-2021 21:08, Nonspecific T wave abnormality no longer evident in Anterolateral leads Confirmed by Julien Nordmann [phone]) on 07/12/2023 9:35:31 AM   Prior history statin myalgia Past medical history reviewed Hospitalized in July 2022  with progressive chest pain and non-STEMI.  Found to have an occluded right coronary artery as well as moderate, nonobstructive LAD and circumflex disease.  EF 60-65%.  The RCA was treated with drug-eluting stent.  A1c of 10.8  ER 07/05/2021  mild episode of chest tightness, shortness of breath which lasted about 20 minutes and resolved without intervention.  Had recurrent symptoms  that evening After 1 albuterol treatment in the ER, patient's symptoms  fully resolved, she is moving air no longer having any chest discomfort.  Most likely bronchospasms it was felt by the emergency room   PMH:   has a past medical history of BMI 50.0-59.9, adult (HCC), CAD (coronary artery disease), Essential hypertension, High cholesterol, History of echocardiogram, History of Papanicolaou smear of cervix (04/2011), 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 (08/18/2009), Tobacco abuse, and Type II diabetes mellitus (HCC).  PSH:    Past Surgical History:  Procedure Laterality Date   COLPOSCOPY  09/28/2009   CORONARY STENT INTERVENTION N/A 05/04/2021   Procedure: CORONARY STENT INTERVENTION;  Surgeon: Iran Ouch, MD;  Location: [facility];  Service: Cardiovascular;  Laterality: N/A;   GASTRIC BYPASS  04/2022   LEFT HEART CATH AND CORONARY ANGIOGRAPHY N/A 05/04/2021   Procedure: LEFT HEART CATH AND CORONARY ANGIOGRAPHY;  Surgeon: Iran Ouch, MD;  Location: [facility];  Service: Cardiovascular;  Laterality: N/A;   TONSILLECTOMY AND ADENOIDECTOMY     TYMPANOSTOMY TUBE PLACEMENT Bilateral     Current Outpatient Medications  Medication Sig Dispense Refill   aspirin 81 MG chewable tablet Chew 1 tablet (81 mg total) by mouth daily. 30 tablet 0   ezetimibe (ZETIA) 10 MG tablet Take 1 tablet (10 mg total) by mouth daily. 90 tablet 3   levonorgestrel (MIRENA) 20 MCG/DAY IUD 1 each by Intrauterine route once.     EPINEPHrine 0.3 mg/0.3 mL IJ  SOAJ injection Inject 0.3 mg into the muscle as needed for anaphylaxis. (Patient not taking: Reported on 07/12/2023) 1 each 0   triamcinolone cream (KENALOG) 0.1 % Apply 1 Application topically 2 (two) times daily. (Patient not taking: Reported on 04/05/2023) 30 g 0   valACYclovir (VALTREX) 1000 MG tablet Take two tablets po bid for one dose prn outbreak (Patient not taking: Reported on 07/12/2023) 30 tablet 0   No current  facility-administered medications for this visit.     Allergies:   Metoprolol, Omeprazole, Statins, Atorvastatin, Rosuvastatin, and Contrast media [iodinated contrast media]   Social History:  The patient  reports that she quit smoking about 2 years ago. Her smoking use included cigarettes. She started smoking about 12 years ago. She has a 10 pack-year smoking history. She has never used smokeless tobacco. She reports that she does not drink alcohol and does not use drugs.   Family History:   family history includes Breast cancer in her maternal grandmother; Diabetes in her father and paternal grandfather; Healthy in her mother; Heart attack in her maternal grandfather.    Review of Systems: Review of Systems  Constitutional: Negative.   HENT: Negative.    Respiratory: Negative.    Cardiovascular: Negative.   Gastrointestinal: Negative.   Musculoskeletal: Negative.   Neurological: Negative.   Psychiatric/Behavioral: Negative.    All other systems reviewed and are negative.  PHYSICAL EXAM: VS:  BP 104/68 (BP Location: Left Arm, Patient Position: Sitting, Cuff Size: Normal)   Pulse 61   Ht 5\' 5"  (1.651 m)   Wt 191 lb 4 oz (86.8 kg)   SpO2 98%   BMI 31.83 kg/m  , BMI Body mass index is 31.83 kg/m. Constitutional:  oriented to person, place, and time. No distress.  HENT:  Head: Grossly normal Eyes:  no discharge. No scleral icterus.  Neck: No JVD, no carotid bruits  Cardiovascular: Regular rate and rhythm, no murmurs appreciated Pulmonary/Chest: Clear to auscultation bilaterally, no wheezes or rails Abdominal: Soft.  no distension.  no tenderness.  Musculoskeletal: Normal range of motion Neurological:  normal muscle tone. Coordination normal. No atrophy Skin: Skin warm and dry Psychiatric: normal affect, pleasant  Recent Labs: 04/05/2023: ALT 22; BUN 14; Creatinine, Ser 0.49; Hemoglobin 11.9; Platelets 319.0; Potassium 4.3; Sodium 139; TSH 1.57    Lipid Panel Lab Results   Component Value Date   CHOL 148 04/05/2023   HDL 36.80 (L) 04/05/2023   LDLCALC 95 04/05/2023   TRIG 81.0 04/05/2023      Wt Readings from Last 3 Encounters:  07/12/23 191 lb 4 oz (86.8 kg)  04/05/23 202 lb (91.6 kg)  01/14/23 212 lb 3.2 oz (96.3 kg)     ASSESSMENT AND PLAN:  Problem List Items Addressed This Visit       Cardiology Problems   Primary hypertension   Relevant Medications   ezetimibe (ZETIA) 10 MG tablet   Other Relevant Orders   EKG 12-Lead (Completed)     Other   Morbid obesity (HCC)   Tobacco abuse   Other Visit Diagnoses     Coronary artery disease of native artery of native heart with stable angina pectoris (HCC)    -  Primary   Relevant Medications   ezetimibe (ZETIA) 10 MG tablet   Other Relevant Orders   EKG 12-Lead (Completed)   Hyperlipidemia LDL goal <70       Relevant Medications   ezetimibe (ZETIA) 10 MG tablet   Type 2 diabetes mellitus with other  specified complication, without long-term current use of insulin (HCC)          Shortness of breath/chest tightness/bronchospasm History of smoking, Stopped smoking, previously used vapor cigarettes Denies significant shortness of breath or chest pain on exertion  Hyperlipidemia Her medications were held by surgical team Previously on Repatha LDL 95, recommend she start Zetia 10 mg daily Prefers not to be on a statin  Coronary disease with stable angina Stent placed to distal RCA Some residual disease noted on catheterization Currently with no symptoms of angina. No further workup at this time. Continue current medication regimen.  Aspirin Zetia  Morbid obesity/gastric bypass surgery Weight down 170 pounds Recommend she stay hydrated given drop in blood pressure    Total encounter time more than 30 minutes  Greater than 50% was spent in counseling and coordination of care with the patient    Signed, Dossie Arbour, M.D., Ph.D. [facility] [place],  [place] [phone]

## 2023-07-12 ENCOUNTER — Encounter: Payer: Self-pay | Admitting: Cardiovascular Disease

## 2023-07-12 ENCOUNTER — Ambulatory Visit: Payer: Medicaid Other | Attending: Cardiovascular Disease | Admitting: Cardiovascular Disease

## 2023-07-12 VITALS — BP 104/68 | HR 61 | Ht 65.0 in | Wt 191.2 lb

## 2023-07-12 DIAGNOSIS — Z720 Tobacco use: Secondary | ICD-10-CM | POA: Diagnosis not present

## 2023-07-12 DIAGNOSIS — I25118 Atherosclerotic heart disease of native coronary artery with other forms of angina pectoris: Secondary | ICD-10-CM | POA: Diagnosis not present

## 2023-07-12 DIAGNOSIS — E785 Hyperlipidemia, unspecified: Secondary | ICD-10-CM

## 2023-07-12 DIAGNOSIS — E1169 Type 2 diabetes mellitus with other specified complication: Secondary | ICD-10-CM

## 2023-07-12 DIAGNOSIS — I10 Essential (primary) hypertension: Secondary | ICD-10-CM

## 2023-07-12 MED ORDER — EZETIMIBE 10 MG PO TABS: 10.0000 mg | ORAL_TABLET | Freq: Every day | ORAL | 3 refills | Status: DC

## 2023-07-12 NOTE — Patient Instructions (Signed)
Medication Instructions:  ?Please start zetia 10 mg daily  ? ?If you need a refill on your cardiac medications before your next appointment, please call your pharmacy.  ? ?Lab work: ?No new labs needed ? ?Testing/Procedures: ?No new testing needed ? ?Follow-Up: ?At [facility], you and your health needs are our priority.  As part of our continuing mission to provide you with exceptional heart care, we have created designated Provider Care Teams.  These Care Teams include your primary Cardiologist (physician) and Advanced Practice Providers (APPs -  Physician Assistants and Nurse Practitioners) who all work together to provide you with the care you need, when you need it. ? ?You will need a follow up appointment in 12 months ? ?Providers on your designated Care Team:   ?Nicolasa Ducking, NP ?Eula Listen, PA-C ?Cadence Fransico Michael, PA-C ? ?COVID-19 Vaccine Information can be found at: [employer] For questions related to vaccine distribution or appointments, please email [email or web address] or call [phone].  ? ?

## 2023-07-19 DIAGNOSIS — R070 Pain in throat: Secondary | ICD-10-CM | POA: Diagnosis not present

## 2023-07-19 DIAGNOSIS — J069 Acute upper respiratory infection, unspecified: Secondary | ICD-10-CM | POA: Diagnosis not present

## 2023-07-19 DIAGNOSIS — Z20822 Contact with and (suspected) exposure to covid-19: Secondary | ICD-10-CM | POA: Diagnosis not present

## 2023-11-19 ENCOUNTER — Telehealth: Payer: Self-pay

## 2023-11-19 NOTE — Telephone Encounter (Signed)
[facility] calling to verify placement of mirena iud 08/22/21. Chart reviewed and verified: IUD PROCEDURE NOTE:   Holly Garner is a 43 y.o. [phone] here for IUD insertion. No GYN concerns.  Last pap smear 2021. Pregnancy excluded: patient on period today   IUD Insertion Procedure Note Patient identified, informed consent performed, consent signed.   Discussed risks of irregular bleeding, cramping, infection, malpositioning or misplacement of the IUD outside the uterus which may require further procedure such as laparoscopy, risk of failure <1%. Time out was performed.     A bimanual exam showed the uterus to be anteverted.  Speculum placed in the vagina.  Cervix visualized.  Cleaned with Betadine x 2.  Grasped anteriorly with a single tooth tenaculum.  Uterus sounded to 8 cm.   Mirena IUD placed per manufacturer's recommendations.  Strings trimmed to 3 cm. Tenaculum was removed, good hemostasis noted.  Patient tolerated procedure well.    Patient was given post-procedure instructions.  She was advised to have backup contraception for one week.  Patient was also asked to check IUD strings periodically and follow up in 4 weeks for IUD check.   Adelene Idler MD, [facility], [facility] 08/22/2021 4:26 PM

## 2024-03-30 ENCOUNTER — Encounter: Payer: Self-pay | Admitting: Family

## 2024-03-30 ENCOUNTER — Ambulatory Visit: Admitting: Family

## 2024-03-30 ENCOUNTER — Encounter: Payer: Self-pay | Admitting: *Deleted

## 2024-03-30 VITALS — BP 124/82 | HR 83 | Temp 98.4°F | Ht 65.0 in | Wt 170.6 lb

## 2024-03-30 DIAGNOSIS — D500 Iron deficiency anemia secondary to blood loss (chronic): Secondary | ICD-10-CM

## 2024-03-30 DIAGNOSIS — Z87891 Personal history of nicotine dependence: Secondary | ICD-10-CM

## 2024-03-30 DIAGNOSIS — L729 Follicular cyst of the skin and subcutaneous tissue, unspecified: Secondary | ICD-10-CM | POA: Diagnosis not present

## 2024-03-30 DIAGNOSIS — E663 Overweight: Secondary | ICD-10-CM

## 2024-03-30 DIAGNOSIS — R222 Localized swelling, mass and lump, trunk: Secondary | ICD-10-CM

## 2024-03-30 DIAGNOSIS — Z6828 Body mass index (BMI) 28.0-28.9, adult: Secondary | ICD-10-CM | POA: Insufficient documentation

## 2024-03-30 DIAGNOSIS — E7800 Pure hypercholesterolemia, unspecified: Secondary | ICD-10-CM

## 2024-03-30 DIAGNOSIS — I10 Essential (primary) hypertension: Secondary | ICD-10-CM

## 2024-03-30 DIAGNOSIS — E119 Type 2 diabetes mellitus without complications: Secondary | ICD-10-CM

## 2024-03-30 LAB — COMPREHENSIVE METABOLIC PANEL WITH GFR
ALT: 45 U/L — ABNORMAL HIGH (ref 0–35)
AST: 29 U/L (ref 0–37)
Albumin: 4.3 g/dL (ref 3.5–5.2)
Alkaline Phosphatase: 88 U/L (ref 39–117)
BUN: 17 mg/dL (ref 6–23)
CO2: 29 meq/L (ref 19–32)
Calcium: 9.4 mg/dL (ref 8.4–10.5)
Chloride: 103 meq/L (ref 96–112)
Creatinine, Ser: 0.5 mg/dL (ref 0.40–1.20)
GFR: 117.17 mL/min (ref >60.00)
Glucose, Bld: 86 mg/dL (ref 70–99)
Potassium: 4.7 meq/L (ref 3.5–5.1)
Sodium: 138 meq/L (ref 135–145)
Total Bilirubin: 0.3 mg/dL (ref 0.2–1.2)
Total Protein: 6.6 g/dL (ref 6.0–8.3)

## 2024-03-30 LAB — LIPID PANEL
Cholesterol: 151 mg/dL (ref 0–200)
HDL: 44.8 mg/dL (ref >39.00)
LDL Cholesterol: 91 mg/dL (ref 0–99)
NonHDL: 106.47
Total CHOL/HDL Ratio: 3
Triglycerides: 75 mg/dL (ref 0.0–149.0)
VLDL: 15 mg/dL (ref 0.0–40.0)

## 2024-03-30 LAB — CBC
HCT: 39 % (ref 36.0–46.0)
Hemoglobin: 12.7 g/dL (ref 12.0–15.0)
MCHC: 32.7 g/dL (ref 30.0–36.0)
MCV: 84 fl (ref 78.0–100.0)
Platelets: 283 10*3/uL (ref 150.0–400.0)
RBC: 4.64 Mil/uL (ref 3.87–5.11)
RDW: 13.2 % (ref 11.5–15.5)
WBC: 7.8 10*3/uL (ref 4.0–10.5)

## 2024-03-30 LAB — MICROALBUMIN / CREATININE URINE RATIO
Creatinine,U: 52.5 mg/dL
Microalb Creat Ratio: 16.4 mg/g (ref 0.0–30.0)
Microalb, Ur: 0.9 mg/dL (ref 0.0–1.9)

## 2024-03-30 LAB — HEMOGLOBIN A1C: Hgb A1c MFr Bld: 5.6 % (ref 4.6–6.5)

## 2024-03-30 MED ORDER — EZETIMIBE 10 MG PO TABS: 10.0000 mg | ORAL_TABLET | Freq: Every day | ORAL | 0 refills | Status: DC

## 2024-03-30 NOTE — Patient Instructions (Signed)
  I have sent in your order electronically for the following: ultrasound check  at this location below. Please call to schedule the appointment at your convenience  [facility] outpatient imaging center off [address], [place] [address] Phone [phone]-  8-5 pm

## 2024-03-30 NOTE — Assessment & Plan Note (Signed)
 Ordered hga1c today pending results. Work on diabetic diet and exercise as tolerated. Yearly foot exam, and annual eye exam.  Urine m/a ordered pending results.

## 2024-03-30 NOTE — Assessment & Plan Note (Signed)
 Cbc ibc ferritin ordered today  Cont iron supplementation

## 2024-03-30 NOTE — Progress Notes (Addendum)
 Established Patient Office Visit  Subjective:      CC:  Chief Complaint  Patient presents with   Medical Management of Chronic Issues    HPI: Holly Garner is a 43 y.o. female presenting on 03/30/2024 for Medical Management of Chronic Issues . DM2: currently diet controlled. Goes to the gym 5-6 times a week, working on [employer] as well as cardio. Doing well with this. Overdue on eye exam.   HTN: today in office 124/82. Average at home 120/80 doing well.   HLD: zetia  10 mg for control, followed by cardiology. She states statin caused rash and repatha  also caused hives as well.   H/o NSTEMI with heart artery stent, compliant with aspirin  and recently started on zetia .   Idiopathic urticaria: had seen rheumatology back in 2023. Had been on plaquenil  and has been on prednisone  in the past. She states she was having swelling intermittently in different areas of her body, and would find swelling that would resolve in a few days. Last she had swelling in her left knee of which has resolved. ANA 04/05/23 was positive has not since seen rheumatology. Currently no joint arthralgias, no weird rashes. Urticaria has improved as well, last episode > 6 months ago.   Spot on her scalp left upper lateral she feels it can 'roll' and is hard. Non tender irritated only when she brushes it she has seen dermatology whom she states was not concerned.   Social history:  Relevant past medical, surgical, family and social history reviewed and updated as indicated. Interim medical history since our last visit reviewed.  Allergies and medications reviewed and updated.  DATA REVIEWED: CHART IN EPIC     ROS: Negative unless specifically indicated above in HPI.    Current Outpatient Medications:    aspirin  81 MG chewable tablet, Chew 1 tablet (81 mg total) by mouth daily., Disp: 30 tablet, Rfl: 0   Calcium  Carb-Cholecalciferol (CALCIUM  + VITAMIN D3) 500-5 MG-MCG TABS, Take by mouth., Disp: , Rfl:     EPINEPHrine  0.3 mg/0.3 mL IJ SOAJ injection, Inject 0.3 mg into the muscle as needed for anaphylaxis., Disp: 1 each, Rfl: 0   ferrous sulfate  325 (65 FE) MG tablet, Take 325 mg by mouth daily with breakfast., Disp: , Rfl:    levonorgestrel  (MIRENA ) 20 MCG/DAY IUD, 1 each by Intrauterine route once., Disp: , Rfl:    Multiple Vitamin (MULTIVITAMIN) tablet, Take 1 tablet by mouth daily., Disp: , Rfl:    ezetimibe  (ZETIA ) 10 MG tablet, Take 1 tablet (10 mg total) by mouth daily., Disp: 90 tablet, Rfl: 0      Objective:     BP 124/82 (BP Location: Left Arm, Patient Position: Sitting, Cuff Size: Normal)   Pulse 83   Temp 98.4 F (36.9 C) (Temporal)   Ht 5\' 5"  (1.651 m)   Wt 170 lb 9.6 oz (77.4 kg)   SpO2 98%   BMI 28.39 kg/m   Wt Readings from Last 3 Encounters:  03/30/24 170 lb 9.6 oz (77.4 kg)  07/12/23 191 lb 4 oz (86.8 kg)  04/05/23 202 lb (91.6 kg)    Physical Exam Constitutional:      General: She is not in acute distress.    Appearance: Normal appearance. She is normal weight. She is not ill-appearing, toxic-appearing or diaphoretic.  HENT:     Head: Normocephalic.  Cardiovascular:     Rate and Rhythm: Normal rate and regular rhythm.  Pulmonary:     Effort: Pulmonary effort  is normal.     Breath sounds: Normal breath sounds.  Musculoskeletal:        General: Normal range of motion.  Skin:         Comments: Mobile non tender boggy mass left upper chest   Left upper scalp with mobile nontender cystic mass no discharge no erythema  Neurological:     General: No focal deficit present.     Mental Status: She is alert and oriented to person, place, and time. Mental status is at baseline.  Psychiatric:        Mood and Affect: Mood normal.        Behavior: Behavior normal.        Thought Content: Thought content normal.        Judgment: Judgment normal.         Title   Diabetic Foot Exam - detailed Is there a history of foot ulcer?: No Is there a foot ulcer now?:  No Is there swelling?: No Is there elevated skin temperature?: No Is there abnormal foot shape?: No Is there a claw toe deformity?: No Are the toenails long?: No Are the toenails thick?: No Are the toenails ingrown?: No Is the skin thin, fragile, shiny and hairless?": No Normal Range of Motion?: Yes Is there foot or ankle muscle weakness?: No Do you have pain in calf while walking?: No Are the shoes appropriate in style and fit?: Yes Can the patient see the bottom of their feet?: Yes Pulse Foot Exam completed.: Yes   Right Posterior Tibialis: Present Left posterior Tibialis: Present   Right Dorsalis Pedis: Present Left Dorsalis Pedis: Present     Semmes-Weinstein Monofilament Test "+" means "has sensation" and "-" means "no sensation"  R Foot Test Control: Pos L Foot Test Control: Pos   R Site 1-Great Toe: Pos L Site 1-Great Toe: Pos   R Site 4: Pos L Site 4: Pos   R site 5: Pos L Site 5: Pos  R Site 6: Pos L Site 6: Pos     Image components are not supported.   Image components are not supported. Image components are not supported.  Tuning [place] Comments     Wt Readings from Last 3 Encounters:  03/30/24 170 lb 9.6 oz (77.4 kg)  07/12/23 191 lb 4 oz (86.8 kg)  04/05/23 202 lb (91.6 kg)      Assessment & Plan:  Pure hypercholesterolemia -     Lipid panel -     Ezetimibe ; Take 1 tablet (10 mg total) by mouth daily.  Dispense: 90 tablet; Refill: 0  Type 2 diabetes mellitus without complication, without long-term current use of insulin  [facility]) Assessment & Plan: Ordered hga1c today pending results. Work on diabetic diet and exercise as tolerated. Yearly foot exam, and annual eye exam.   Urine m/a ordered pending results.   Orders: -     Hemoglobin A1c -     Microalbumin / creatinine urine ratio -     Comprehensive metabolic panel with GFR  Overweight with body mass index (BMI) of 28 to 28.9 in adult  Primary hypertension Assessment & Plan: Stable today   Continue without medication, not needed currently.  Urine m/a ordered pending results.   Orders: -     Comprehensive metabolic panel with GFR  Quit smoking  Iron deficiency anemia due to chronic blood loss Assessment & Plan: Cbc ibc ferritin ordered today  Cont iron supplementation   Orders: -     CBC -  IBC + Ferritin  Lump in chest Assessment & Plan: Suspected lipoma Ordering u/s to r/o   Orders: -     US  CHEST SOFT TISSUE; Future  Scalp cyst Assessment & Plan: Suspected benign cyst Advised to monitor for inflammation, tenderness, increase in size.        Return in about 1 year (around 03/30/2025) for f/u CPE.  Felicita Horns, MSN, APRN, FNP-C [facility]

## 2024-03-30 NOTE — Assessment & Plan Note (Signed)
 Suspected lipoma Ordering u/s to r/o

## 2024-03-30 NOTE — Assessment & Plan Note (Signed)
 Stable today  Continue without medication, not needed currently.  Urine m/a ordered pending results.

## 2024-03-30 NOTE — Assessment & Plan Note (Signed)
 Suspected benign cyst Advised to monitor for inflammation, tenderness, increase in size.

## 2024-04-02 ENCOUNTER — Ambulatory Visit: Payer: Self-pay | Admitting: Family

## 2024-04-02 ENCOUNTER — Other Ambulatory Visit: Payer: Self-pay | Admitting: Family

## 2024-04-02 DIAGNOSIS — R7989 Other specified abnormal findings of blood chemistry: Secondary | ICD-10-CM

## 2024-04-02 DIAGNOSIS — D171 Benign lipomatous neoplasm of skin and subcutaneous tissue of trunk: Secondary | ICD-10-CM

## 2024-04-02 LAB — IBC + FERRITIN
Ferritin: 28.1 ng/mL (ref 10.0–291.0)
Iron: 91 ug/dL (ref 42–145)
Saturation Ratios: 29 % (ref 20.0–50.0)
TIBC: 313.6 ug/dL (ref 250.0–450.0)
Transferrin: 224 mg/dL (ref 212.0–360.0)

## 2024-04-07 ENCOUNTER — Ambulatory Visit
Admission: RE | Admit: 2024-04-07 | Discharge: 2024-04-07 | Disposition: A | Discharge status: Home / Self Care | Source: Ambulatory Visit | Attending: Family | Admitting: Family

## 2024-04-07 DIAGNOSIS — R222 Localized swelling, mass and lump, trunk: Secondary | ICD-10-CM | POA: Insufficient documentation

## 2024-04-16 DIAGNOSIS — D171 Benign lipomatous neoplasm of skin and subcutaneous tissue of trunk: Secondary | ICD-10-CM | POA: Insufficient documentation

## 2024-04-20 NOTE — Telephone Encounter (Signed)
 This has already been addressed. Pt aware.

## 2024-06-08 ENCOUNTER — Encounter: Payer: Self-pay | Admitting: *Deleted

## 2024-06-17 ENCOUNTER — Telehealth: Payer: Self-pay

## 2024-06-17 ENCOUNTER — Encounter: Payer: Self-pay | Admitting: Cardiovascular Disease

## 2024-06-17 ENCOUNTER — Encounter: Payer: Self-pay | Admitting: Family

## 2024-06-17 ENCOUNTER — Ambulatory Visit

## 2024-06-17 VITALS — BP 123/74 | HR 86 | Ht 65.0 in | Wt 177.0 lb

## 2024-06-17 DIAGNOSIS — Z9884 Bariatric surgery status: Secondary | ICD-10-CM

## 2024-06-17 DIAGNOSIS — Z6829 Body mass index (BMI) 29.0-29.9, adult: Secondary | ICD-10-CM

## 2024-06-17 DIAGNOSIS — L987 Excessive and redundant skin and subcutaneous tissue: Secondary | ICD-10-CM | POA: Diagnosis not present

## 2024-06-17 DIAGNOSIS — E65 Localized adiposity: Secondary | ICD-10-CM | POA: Diagnosis not present

## 2024-06-17 DIAGNOSIS — R21 Rash and other nonspecific skin eruption: Secondary | ICD-10-CM

## 2024-06-17 NOTE — Telephone Encounter (Signed)
 Faxed the Request for Surgical Clearance form to patient's PCP and cardiologist with confirmed receipt.   PCP: Ginger patrick, FNP Phone: [phone] Fax: [phone]  Cardiologist: Phone: [phone] Fax: [phone]

## 2024-06-17 NOTE — Progress Notes (Signed)
 Plastic & Reconstructive Surgery New Patient Visit  Patient: Holly Garner MRN: [id number] Date: 06/17/24   Chief Complaint: Excess skin   History of Present Illness:  This is a 43 y.o. female with PMH and PSH as presented below who presents for consultation for body contouring following massive weight loss.  The patient underwent laparoscopic RYGB in 2013. Prior to the surgery, her highest weight was 372 lbs. Since then, she has lost almost 195 lbs. Her weight today is 177 lbs and Body mass index is 29.45 kg/m.. With the weight loss she has noted excess skin involving her abdomen. Pt complains of rashes in between the skin folds which persist despite antifungal creams and powders.   Follows closely with Bariatrics & Nutrition. No known nutritional issues. No hernias. Does Vape. Functional status is good. Pertinent history of CAD and NSTEMI for which if we proceed with surgery she will need cardiology clearance.   Past Medical History: Past Medical History:  Diagnosis Date   BMI 50.0-59.9, adult (HCC)    CAD (coronary artery disease)    a. 04/2021 NSTEMI/PCI: LM nl, LAD 67m, 30d, D1 nl, LCX 73m, OM1 min irregs, RCA 20p, 30p/m, 100d (2.5x26 Resolute Onyx DES).   Essential hypertension    High cholesterol    History of echocardiogram    a. 04/2021 Echo: EF 60-65%. No rwma. Nl RV fxn.   History of Papanicolaou smear of cervix 04/2011   normal per pt.   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   hgsil   Tobacco abuse    Type II diabetes mellitus (HCC)     Past Surgical History: Past Surgical History:  Procedure Laterality Date   COLPOSCOPY  09/28/2009   CORONARY STENT INTERVENTION N/A 05/04/2021   Procedure: CORONARY STENT INTERVENTION;  Surgeon: Darron Deatrice LABOR, MD;  Location: [facility] INVASIVE CV LAB;  Service: Cardiovascular;  Laterality: N/A;   GASTRIC BYPASS  04/2022   LEFT HEART CATH AND CORONARY ANGIOGRAPHY N/A 05/04/2021   Procedure: LEFT HEART CATH AND  CORONARY ANGIOGRAPHY;  Surgeon: Darron Deatrice LABOR, MD;  Location: [facility] INVASIVE CV LAB;  Service: Cardiovascular;  Laterality: N/A;   TONSILLECTOMY AND ADENOIDECTOMY     TYMPANOSTOMY TUBE PLACEMENT Bilateral     Current Medications: Current Outpatient Medications on File Prior to Visit  Medication Sig Dispense Refill   aspirin  81 MG chewable tablet Chew 1 tablet (81 mg total) by mouth daily. 30 tablet 0   Calcium  Carb-Cholecalciferol (CALCIUM  + VITAMIN D3) 500-5 MG-MCG TABS Take by mouth.     EPINEPHrine  0.3 mg/0.3 mL IJ SOAJ injection Inject 0.3 mg into the muscle as needed for anaphylaxis. 1 each 0   ezetimibe  (ZETIA ) 10 MG tablet Take 1 tablet (10 mg total) by mouth daily. 90 tablet 0   ferrous sulfate  325 (65 FE) MG tablet Take 325 mg by mouth daily with breakfast.     levonorgestrel  (MIRENA ) 20 MCG/DAY IUD 1 each by Intrauterine route once.     Multiple Vitamin (MULTIVITAMIN) tablet Take 1 tablet by mouth daily.     No current facility-administered medications on file prior to visit.    Allergies: Allergies  Allergen Reactions   Metoprolol  Hives   Omeprazole Hives   Statins Hives, Itching and Other (See Comments)    Other reaction(s): Unknown    Atorvastatin  Hives and Itching   Rosuvastatin  Hives and Itching   Contrast Media [Iodinated Contrast Media] Itching   Repatha  [Evolocumab ] Hives    Family History:  Family  history is negative for bleeding/clotting disorders, problems with anesthesia, or connective tissue disorders.   Social History:  Social History   Socioeconomic History   Marital status: Married    Spouse name: Not on file   Number of children: 3   Years of education: 12   Highest education level: Some college, no degree  Occupational History   Occupation: unemployed   Occupation: Dentist: [employer]  Tobacco Use   Smoking status: Former    Current packs/day: 0.00    Average packs/day: 1 pack/day for 10.0 years (10.0 ttl  pk-yrs)    Types: Cigarettes    Start date: 05/04/2011    Quit date: 05/03/2021    Years since quitting: 3.1   Smokeless tobacco: Never  Vaping Use   Vaping status: Former   Quit date: 03/19/2022  Substance and Sexual Activity   Alcohol use: No   Drug use: No   Sexual activity: Yes    Partners: Male    Birth control/protection: Surgical, I.U.D.    Comment: Mirena /Vasectomy  Other Topics Concern   Not on file  Social History Narrative   Three kiddos   56, 97, 72    Oldest boy, two younger girls   Social Drivers of Corporate investment banker Strain: Low Risk  (03/28/2024)   Overall Financial Resource Strain (CARDIA)    Difficulty of Paying Living Expenses: Not hard at all  Food Insecurity: No Food Insecurity (03/28/2024)   Hunger Vital Sign    Worried About Running Out of Food in the Last Year: Never true    Ran Out of Food in the Last Year: Never true  Transportation Needs: No Transportation Needs (03/28/2024)   PRAPARE - Administrator, Civil Service (Medical): No    Lack of Transportation (Non-Medical): No  Physical Activity: Sufficiently Active (03/28/2024)   Exercise Vital Sign    Days of Exercise per Week: 7 days    Minutes of Exercise per Session: 90 min  Stress: No Stress Concern Present (03/28/2024)   [employer] of Occupational Health - Occupational Stress Questionnaire    Feeling of Stress : Not at all  Social Connections: Moderately Integrated (03/28/2024)   Social Connection and Isolation Panel    Frequency of Communication with Friends and Family: More than three times a week    Frequency of Social Gatherings with Friends and Family: More than three times a week    Attends Religious Services: More than 4 times per year    Active Member of [employer] or Organizations: No    Attends Engineer, structural: Not on file    Marital Status: Married   Smoker Yes, Vapes Occupation: Education officer, museum Drug use: Denies  Review of systems: 10 point  review of systems performed and negative except as noted in the HPI  Physical Exam: BP 123/74 (BP Location: Left Arm, Patient Position: Sitting, Cuff Size: Normal)   Pulse 86   Ht 5' 5 (1.651 m)   Wt 177 lb (80.3 kg)   SpO2 99%   BMI 29.45 kg/m  Body mass index is 29.45 kg/m.  Physical Exam           General: Well appearing, no apparent distress. Pulm: Breathing comfortably on room air without sounds/wheezing. CV: Good perfusion of extremities. Chest: Chest wall without abnormality or obvious deformity. No scoliosis, pectus excavatum or pectus carinatum. Abdomen: Soft, non-tender, no distension. No palpable hernia or bulge. Approximately 4 cm diastasis. Global  lipodystrophy of abdomen with excess skin in both horizontal and vertical dimensions. Skin excess in infraumbilical region with infraumbilical fat deposits.Overhanging pannus. Redundant/ptotic mons tissue.  Excess skin extends circumferentially along posterior trunk. No current intertriginous rash. Well healed scars present.  Lower Ext: Lipodystrophy of bilateral medial thighs. Moderate amount of subcutaneous fat present. Severe amount of skin laxity. Skin quality is poor. No open wounds or scars.  Neuro: Moving all four extremities spontaneously. Psych: Appropriate mood and affect.  Labs: Recent Results (from the past 2160 hours)  Lipid panel     Status: None   Collection Time: 03/30/24 10:11 AM  Result Value Ref Range   Cholesterol 151 0 - 200 mg/dL    Comment: ATP III Classification       Desirable:  < 200 mg/dL               Borderline High:  200 - 239 mg/dL          High:  > = 759 mg/dL   Triglycerides 24.9 0.0 - 149.0 mg/dL    Comment: Normal:  <849 mg/dLBorderline High:  150 - 199 mg/dL   HDL 55.19 >60.99 mg/dL   VLDL 84.9 0.0 - 59.9 mg/dL   LDL Cholesterol 91 0 - 99 mg/dL   Total CHOL/HDL Ratio 3     Comment:                Men          Women1/2 Average Risk     3.4          3.3Average Risk          5.0           4.42X Average Risk          9.6          7.13X Average Risk          15.0          11.0                       NonHDL 106.47     Comment: NOTE:  Non-HDL goal should be 30 mg/dL higher than patient's LDL goal (i.e. LDL goal of < 70 mg/dL, would have non-HDL goal of < 100 mg/dL)  CBC     Status: None   Collection Time: 03/30/24 10:11 AM  Result Value Ref Range   WBC 7.8 4.0 - 10.5 K/uL   RBC 4.64 3.87 - 5.11 Mil/uL   Platelets 283.0 150.0 - 400.0 K/uL   Hemoglobin 12.7 12.0 - 15.0 g/dL   HCT 60.9 63.9 - 53.9 %   MCV 84.0 78.0 - 100.0 fl   MCHC 32.7 30.0 - 36.0 g/dL   RDW 86.7 88.4 - 84.4 %  IBC + Ferritin     Status: None   Collection Time: 03/30/24 10:11 AM  Result Value Ref Range   Iron 91 42 - 145 ug/dL   Transferrin 775.9 787.9 - 360.0 mg/dL   Saturation Ratios 70.9 20.0 - 50.0 %   Ferritin 28.1 10.0 - 291.0 ng/mL   TIBC 313.6 250.0 - 450.0 mcg/dL  Hemoglobin J8r     Status: None   Collection Time: 03/30/24 10:11 AM  Result Value Ref Range   Hgb A1c MFr Bld 5.6 4.6 - 6.5 %    Comment: Glycemic Control Guidelines for People with Diabetes:Non Diabetic:  <6%Goal of Therapy: <7%Additional Action Suggested:  >8%   Microalbumin / creatinine  urine ratio     Status: None   Collection Time: 03/30/24 10:11 AM  Result Value Ref Range   Microalb, Ur 0.9 0.0 - 1.9 mg/dL   Creatinine,U 47.4 mg/dL   Microalb Creat Ratio 16.4 0.0 - 30.0 mg/g  Comprehensive metabolic panel with GFR     Status: Abnormal   Collection Time: 03/30/24 10:11 AM  Result Value Ref Range   Sodium 138 135 - 145 mEq/L   Potassium 4.7 3.5 - 5.1 mEq/L   Chloride 103 96 - 112 mEq/L   CO2 29 19 - 32 mEq/L   Glucose, Bld 86 70 - 99 mg/dL   BUN 17 6 - 23 mg/dL   Creatinine, Ser 9.49 0.40 - 1.20 mg/dL   Total Bilirubin 0.3 0.2 - 1.2 mg/dL   Alkaline Phosphatase 88 39 - 117 U/L   AST 29 0 - 37 U/L   ALT 45 (H) 0 - 35 U/L   Total Protein 6.6 6.0 - 8.3 g/dL   Albumin 4.3 3.5 - 5.2 g/dL   GFR 882.82 >39.99 mL/min     Comment: Calculated using the CKD-EPI Creatinine Equation (2021)   Calcium  9.4 8.4 - 10.5 mg/dL    Pertinent Imaging:  None  Assessment: This is a 42 y.o. female with history of massive weight loss who presents for consultation for body contouring. The patient has maintained a 177 lb weight loss for over 6 months and has excess skin on their abdomen and thighs. Based on the patient's exam and discussion of patient's goals today, I believe the patient would be an appropriate candidate for panniculectomy, possible fleur de lis abdominoplasty, and thighplasty.  We discussed the patient's goals and priorities at length. We reviewed that body contouring operations trade better contours at the expense of long, sometimes wide scars. We further discussed the operation(s) in detail including the incision and scar patterns, need for surgical drains and importance of postoperative compression. We reviewed that massive weight loss patients are at a higher risk for recurrent laxity and wound healing problems including dehiscence and infection. We further discussed the risks of bleeding, seroma, hematoma, asymmetries, standing cutaneous deformities or other contour deformities, asymmetry, skin/nipple/umbilical necrosis, fat necrosis/oil cysts, lymphedema, chronic wounds or sinus tracts, hypertrophic and keloid scarring, nerve injury (ie. LFCN, iliohypogastric, ilioinguinal nerves), numbness, paresthesia and sensory changes, intraabdominal injury. Revision procedures are not uncommon. We also discussed the risk of DVT/PE, fat embolism, heart attack, stroke, death as well as the risks of anesthesia. We reviewed the expected recovery period with no heavy activities or lifting >5lbs for 6 weeks postoperatively.   Timing of body contouring procedures should allow 12 months after bariatric surgery and 6 months at a stable weight for patients to achieve metabolic and nutritional homeostasis.  The patient's BMI today is  Body mass index is 29.45 kg/m.SABRA We discussed that patients with BMI>30 are more likely to suffer both minor and major complications following these procedures. The patient verbalized understanding of this concept and is willing to accept these increased risks.   The patient voiced understanding and wishes to proceed. All questions and concerns were addressed to the patient's apparent satisfaction.  Plan - We will plan for panniculectomy, possible fleur de lis with liposuction and rectus plication. We will address her thighs at a later date.  - 5 hours under general anesthesia. - Will submit for pre-determination with insurance. - Scheduling pending insurance.  - Patient will need cardiology clearance and PCP clearance before surgery. - Cotinine test  to be done 2-3 weeks before surgery, if positive we will postpone until negative.  The sensitive parts of the examination/procedure were performed with MA as chaperone.  The time documented represents the total time spent on the day of the encounter in preparing for and completing the visit. It does not include time spent by ancillary staff, a resident, a fellow, another trainee, or, for shared visits, time spent jointly with the patient or discussing the case or the performance of other separately performed services.  Time spent: 45 minutes.     Osha Errico, MD [facility]  06/17/2024 1:42 PM

## 2024-06-18 ENCOUNTER — Encounter: Payer: Self-pay | Admitting: Family

## 2024-06-18 ENCOUNTER — Ambulatory Visit: Admitting: Family

## 2024-06-18 ENCOUNTER — Telehealth: Payer: Self-pay | Admitting: Cardiovascular Disease

## 2024-06-18 VITALS — BP 122/72 | HR 69 | Temp 98.7°F | Wt 179.4 lb

## 2024-06-18 DIAGNOSIS — Z1231 Encounter for screening mammogram for malignant neoplasm of breast: Secondary | ICD-10-CM | POA: Diagnosis not present

## 2024-06-18 NOTE — Telephone Encounter (Signed)
   Name: Holly Garner  DOB: [birth year]  MRN: [id number]  Primary Cardiologist: None  Chart reviewed as part of pre-operative protocol coverage. Because of Holly Garner's past medical history and time since last visit, she will require a follow-up in-office visit in order to better assess preoperative cardiovascular risk.  Pre-op covering staff: - Please schedule appointment and call patient to inform them. If patient already had an upcoming appointment within acceptable timeframe, please add pre-op clearance to the appointment notes so provider is aware. - Please contact requesting surgeon's office via preferred method (i.e, phone, fax) to inform them of need for appointment prior to surgery.  Wyn Raddle, Jackee Shove, NP  06/18/2024, 9:32 AM

## 2024-06-18 NOTE — Telephone Encounter (Signed)
 Saw patient already, thank you for speaking with the patient.  Please see note for further information.

## 2024-06-18 NOTE — Telephone Encounter (Signed)
 Patient has been scheduled for IN OFFICE visit for preop clearance patient stated procedure is probably going to be till November patient is scheduled for mid October

## 2024-06-18 NOTE — Progress Notes (Unsigned)
 Established Patient Office Visit  Subjective:      CC:  Chief Complaint  Patient presents with   Follow-up    HPI: Holly Garner is a 43 y.o. female presenting on 06/18/2024 for Follow-up .  Discussed the use of AI scribe software for clinical note transcription with the patient, who gave verbal consent to proceed.  History of Present Illness Holly Garner is a 43 year old female who presents for surgical clearance for skin removal surgery.  She is seeking surgical clearance for a skin removal procedure after bariatric surgery. She has consulted with a plastic surgeon and requires clearance from her primary care provider and a cardiologist.  She has experienced a gradual increase in weight despite attending the gym five days a week. Her eating habits are inconsistent, with some days being 'snackier' than others, and she occasionally needs to remind herself to eat due to changes in her stomach post-surgery. She is inquiring about medication options to assist with weight maintenance, noting previous use of Ozempic  for diabetes, which is now in remission since her bariatric surgery.  Her diabetes has been in remission since the day after her bariatric surgery, and she has not used Ozempic  since then. She is considering resuming Ozempic  or trying Mounjaro for weight management, pending approval from her bariatric surgeon.  She experiences anxiety about undergoing anesthesia for the upcoming surgery, recalling significant distress prior to her bariatric surgery, including frequent crying leading up to the procedure and on the day of the surgery itself.  She is currently using a nicotine  vape but has not smoked cigarettes since her last appointment. She has not used the vape since her recent appointment with the plastic surgeon.      Wt Readings from Last 3 Encounters:  06/18/24 179 lb 6.4 oz (81.4 kg)  06/17/24 177 lb (80.3 kg)  03/30/24 170 lb 9.6 oz (77.4 kg)          Social history:  Relevant past medical, surgical, family and social history reviewed and updated as indicated. Interim medical history since our last visit reviewed.  Allergies and medications reviewed and updated.  DATA REVIEWED: CHART IN EPIC     ROS: Negative unless specifically indicated above in HPI.    Current Outpatient Medications:    aspirin  81 MG chewable tablet, Chew 1 tablet (81 mg total) by mouth daily., Disp: 30 tablet, Rfl: 0   Calcium  Carb-Cholecalciferol (CALCIUM  + VITAMIN D3) 500-5 MG-MCG TABS, Take by mouth., Disp: , Rfl:    EPINEPHrine  0.3 mg/0.3 mL IJ SOAJ injection, Inject 0.3 mg into the muscle as needed for anaphylaxis., Disp: 1 each, Rfl: 0   ferrous sulfate  325 (65 FE) MG tablet, Take 325 mg by mouth daily with breakfast., Disp: , Rfl:    levonorgestrel  (MIRENA ) 20 MCG/DAY IUD, 1 each by Intrauterine route once., Disp: , Rfl:    Multiple Vitamin (MULTIVITAMIN) tablet, Take 1 tablet by mouth daily., Disp: , Rfl:         Objective:        BP 122/72 (BP Location: Left Arm, Patient Position: Sitting, Cuff Size: Large)   Pulse 69   Temp 98.7 F (37.1 C) (Temporal)   Wt 179 lb 6.4 oz (81.4 kg)   SpO2 98%   BMI 29.85 kg/m   Physical Exam GENERAL: Normal appearance  Wt Readings from Last 3 Encounters:  06/18/24 179 lb 6.4 oz (81.4 kg)  06/17/24 177 lb (80.3 kg)  03/30/24 170 lb 9.6 oz (  77.4 kg)    Physical Exam Vitals reviewed.  Constitutional:      General: She is not in acute distress.    Appearance: Normal appearance. She is normal weight. She is not ill-appearing, toxic-appearing or diaphoretic.  HENT:     Head: Normocephalic.  Cardiovascular:     Rate and Rhythm: Normal rate and regular rhythm.  Pulmonary:     Effort: Pulmonary effort is normal.     Breath sounds: Normal breath sounds.  Musculoskeletal:        General: Normal range of motion.  Neurological:     General: No focal deficit present.     Mental Status:  She is alert and oriented to person, place, and time. Mental status is at baseline.  Psychiatric:        Mood and Affect: Mood normal.        Behavior: Behavior normal.        Thought Content: Thought content normal.        Judgment: Judgment normal.          Results   Assessment & Plan:   Assessment and Plan Assessment & Plan Obesity status post bariatric surgery Weight gain post-bariatric surgery. Previous use of Ozempic  was effective for diabetes management, now in remission. Potential side effects of GLP-1 receptor agonists include delayed gastric emptying, which should be considered post-bariatric surgery. Cardioprotective benefits noted. Insurance coverage for Pat is uncertain. Arts development officer for clearance to restart Ozempic  or initiate Mounjaro. - Check insurance coverage for Mounjaro. - If approved, initiate prior authorization for Mounjaro.  Type 2 diabetes mellitus in remission Diabetes remains in remission post-bariatric surgery. No current use of diabetes medications. - Monitor weight management to prevent recurrence.  Nicotine  dependence, currently vaping Currently using nicotine  vape. No cigarette smoking reported. Advised to quit vaping, especially in preparation for upcoming surgery. - Consider nicotine  replacement therapy if needed. - Encourage cessation of vaping before surgery.  Recording duration: 13 minutes   Smoking cessation instruction/counseling given:  counseled patient on the dangers of tobacco use, advised patient to stop smoking, and reviewed strategies to maximize success    Return in about 1 year (around 06/18/2025) for f/u CPE.     Ginger Patrick, MSN, APRN, FNP-C [facility]

## 2024-06-18 NOTE — Telephone Encounter (Signed)
   Pre-operative Risk Assessment    Patient Name: Holly Garner  DOB: 1981/02/14 MRN: [id number]   Date of last office visit: 07/12/2023 Date of next office visit: n/a   Request for Surgical Clearance    Procedure:  Panniculectomy, possible rectus plication, lipo flanks and vertical component  Date of Surgery:  Clearance TBD                                Surgeon:  Dr. Lisandro Montorfano Surgeon's Group or Practice Name:  [facility] Phone number:  [phone] Fax number:  [phone]   Type of Clearance Requested:   - Medical    Type of Anesthesia:  General    Additional requests/questions:    SignedTinnie NOVAK Schools   06/18/2024, 9:16 AM

## 2024-06-26 ENCOUNTER — Encounter

## 2024-06-30 DIAGNOSIS — Z9884 Bariatric surgery status: Secondary | ICD-10-CM | POA: Diagnosis not present

## 2024-06-30 DIAGNOSIS — K909 Intestinal malabsorption, unspecified: Secondary | ICD-10-CM | POA: Diagnosis not present

## 2024-07-13 ENCOUNTER — Telehealth: Payer: Self-pay

## 2024-07-13 ENCOUNTER — Telehealth: Payer: Self-pay | Admitting: Family

## 2024-07-13 NOTE — Telephone Encounter (Signed)
 Re-faxed the Request for Surgical Clearance form to patient's PCP with confirmed receipt.

## 2024-07-13 NOTE — Telephone Encounter (Signed)
 Lvm for preop clearance appt

## 2024-07-13 NOTE — Telephone Encounter (Signed)
 Yes if they require primary care approval  I do believe we had discussed in office that she would need a separate appt for clearance since other was for acute issues and not specifically for pre op   Would need pre op form for visit

## 2024-07-13 NOTE — Telephone Encounter (Signed)
 Received a surgical clearance form from [facility]. Pt was just seen on 06/18/24 and has a pending appt with her cardiologist for surgical clearance. Does she need another appt?

## 2024-07-14 ENCOUNTER — Telehealth: Payer: Self-pay | Admitting: Family

## 2024-07-14 ENCOUNTER — Telehealth: Payer: Self-pay

## 2024-07-14 ENCOUNTER — Telehealth: Payer: Self-pay | Admitting: Student

## 2024-07-14 NOTE — Telephone Encounter (Signed)
 Copied from CRM [id number]. Topic: Appointments - Scheduling Inquiry for Clinic >> Jul 14, 2024  3:35 PM Holly Garner wrote: Reason for CRM: Patient called to schedule clearance appointment but there are no available times. She states that she will need an appointment before 10/08 after 2:30pm. She states that she cannot take off of work. BookIt is showing me appointments for next year.   ----------------------------------------------------------------------- From previous Reason for Contact - Scheduling: Patient/patient representative is calling to schedule an appointment. Refer to attachments for appointment information.

## 2024-07-14 NOTE — Telephone Encounter (Signed)
 Called pt and she agreed to change her f/u to the preop per CE and at the later time that day  Pt requested that she get a call back from clinical, she has more questions concerning her sx clearances

## 2024-07-14 NOTE — Telephone Encounter (Signed)
 Patient would like to know whoch doctors she needs surgical clearances from, please reach out and advise

## 2024-07-14 NOTE — Telephone Encounter (Signed)
 I spoke with Dr. Montorfano about the clearances. He is fine with the patient not having PCP clearance but we will need cardiology clearance. I informed the patient that we will move her appointments so she will have her pre-op after her cardiology appointment. I also informed her that she will need to have her nicotine  test done prior to her visit on the 22nd. I let the patient know she can call with any questions.

## 2024-07-15 NOTE — Telephone Encounter (Signed)
 Duplicate message one sent to scheduling before received this one.

## 2024-07-23 ENCOUNTER — Emergency Department
Admission: EM | Admit: 2024-07-23 | Discharge: 2024-07-23 | Disposition: A | Discharge status: Home / Self Care | Attending: Emergency Medicine | Admitting: Emergency Medicine

## 2024-07-23 ENCOUNTER — Encounter: Payer: Self-pay | Admitting: Emergency Medicine

## 2024-07-23 ENCOUNTER — Other Ambulatory Visit: Payer: Self-pay

## 2024-07-23 DIAGNOSIS — M79602 Pain in left arm: Secondary | ICD-10-CM | POA: Diagnosis present

## 2024-07-23 DIAGNOSIS — E119 Type 2 diabetes mellitus without complications: Secondary | ICD-10-CM | POA: Insufficient documentation

## 2024-07-23 DIAGNOSIS — I10 Essential (primary) hypertension: Secondary | ICD-10-CM | POA: Insufficient documentation

## 2024-07-23 DIAGNOSIS — I2510 Atherosclerotic heart disease of native coronary artery without angina pectoris: Secondary | ICD-10-CM | POA: Insufficient documentation

## 2024-07-23 DIAGNOSIS — R202 Paresthesia of skin: Secondary | ICD-10-CM | POA: Insufficient documentation

## 2024-07-23 LAB — TROPONIN I (HIGH SENSITIVITY): Troponin I (High Sensitivity): 6 ng/L (ref <18)

## 2024-07-23 MED ORDER — CYCLOBENZAPRINE HCL 5 MG PO TABS: 5.0000 mg | ORAL_TABLET | Freq: Three times a day (TID) | ORAL | 0 refills | Status: DC | PRN

## 2024-07-23 MED ORDER — CYCLOBENZAPRINE HCL 10 MG PO TABS
10.0000 mg | ORAL_TABLET | Freq: Once | ORAL | Status: AC
  Administered 2024-07-23: 10 mg via ORAL
  Filled 2024-07-23: qty 1

## 2024-07-23 NOTE — ED Triage Notes (Signed)
 Pt arrives POV, ambulatory to triage, no acute distress noted c/o left arm pain. Pt states intermittent pain described sharp and dull; intermittent tingling and numbness for 3 days; denies injury. 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 Pt denies cp or shoulder pain, n/v, sob.

## 2024-07-23 NOTE — ED Notes (Signed)
 PT in no acute distress prior to discharge. Discharged instructions reviewed, all questions answered and pt verbalized understanding at this time. Pt has all belongings with them at time of discharge.

## 2024-07-23 NOTE — ED Provider Notes (Signed)
 [facility] Emergency Department Provider Note     Event Date/Time   First MD Initiated Contact with Patient 07/23/24 2028     (approximate)   History   Arm Pain   HPI  Holly Garner is a 43 y.o. left handed female with a history of CAD, HTN, HLD, DM type II, and AMI in 2022, who presents in no acute distress.  She endorses left-sided arm pain with onset 3 days ago.  She reports episode been intermittent and describes it discomfort as sharp and dull in nature.  She is also noted some intermittent tingling and numbness.  No recent injury, trauma, or falls.  She does report being in the gym last week which is her typical routine, but acute LUE symptoms have prevented her from reporting to the gym this week.  Patient denies any chest pain, shortness of breath, or diaphoresis.  No chronic or ongoing shoulder pathology.  She does have voice some concern given her prior cardiac history that the symptoms may represent an acute coronary syndrome, despite not endorsing chest pain at this time.   Physical Exam   Triage Vital Signs: ED Triage Vitals  Encounter Vitals Group     BP 07/23/24 2006 (!) 143/76     Girls Systolic BP Percentile --      Girls Diastolic BP Percentile --      Boys Systolic BP Percentile --      Boys Diastolic BP Percentile --      Pulse Rate 07/23/24 2006 68     Resp 07/23/24 2006 20     Temp 07/23/24 2006 98.2 F (36.8 C)     Temp Source 07/23/24 2006 Oral     SpO2 07/23/24 2006 100 %     Weight 07/23/24 2006 163 lb (73.9 kg)     Height 07/23/24 2006 5' 5 (1.651 m)     Head Circumference --      Peak Flow --      Pain Score 07/23/24 2009 4     Pain Loc --      Pain Education --      Exclude from Growth Chart --     Most recent vital signs: Vitals:   07/23/24 2006 07/23/24 2009  BP: (!) 143/76   Pulse: 68   Resp: 20   Temp: 98.2 F (36.8 C)   SpO2: 100% 100%    General Awake, no distress.  HEENT NCAT. PERRL. EOMI.  No rhinorrhea. Mucous membranes are moist.  CV:  Good peripheral perfusion. No CCE distally RESP:  Normal effort.  MSK:  Normal spinal alignment without midline tenderness, spasm, homely, step-off to the cervical or thoracic spine.  No gross deformity, dislocation, or sulcus sign noted to the left shoulder.  Normal composite fist distally.  AROM of the LUE without difficulty.  No evidence internal derangement NEURO: Cranial nerves II to XII grossly intact.  Normal composite fist distally.  Normal UE DTRs bilaterally.  Normal intrinsic and opposition testing noted.   ED Results / Procedures / Treatments   Labs (all labs ordered are listed, but only abnormal results are displayed) Labs Reviewed  TROPONIN I (HIGH SENSITIVITY)     EKG  Vent. rate 65 BPM PR interval 136 ms QRS duration 92 ms QT/QTcB 394/409 ms P-R-T axes 78 78 44 Normal sinus rhythm Normal ECG When compared with ECG of 12-Jul-2023 09:08, No significant change was found No STEMI  RADIOLOGY  No results found.  PROCEDURES:  Critical Care performed: No  Procedures   MEDICATIONS ORDERED IN ED: Medications  cyclobenzaprine (FLEXERIL) tablet 10 mg (10 mg Oral Given 07/23/24 2145)     IMPRESSION / MDM / ASSESSMENT AND PLAN / ED COURSE  I reviewed the triage vital signs and the nursing notes.                              Differential diagnosis includes, but is not limited to, cervical radiculopathy, tendinitis, bursitis, shoulder strain  Patient's presentation is most consistent with acute complicated illness / injury requiring diagnostic workup.  Patient's diagnosis is consistent with L UE paresthesias which may be radicular in nature.  Patient also with some reproducible shoulder pain concerning for tendinitis.  Otherwise patient is neurovascularly intact.  No concern for acute neuro event management as patient has no frank paralysis or weakness.  No evidence of ACS with reassuring exam, labs, and EKG.   Patient is reassured that her symptoms do not represent ACS.  Patient will be discharged home with prescriptions for Cyclobenzaprine . Patient is to follow up with Ortho as needed or otherwise directed. Patient is given ED precautions to return to the ED for any worsening or new symptoms.   FINAL CLINICAL IMPRESSION(S) / ED DIAGNOSES   Final diagnoses:  Left arm pain  Paresthesia of left arm     Rx / DC Orders   ED Discharge Orders          Ordered    cyclobenzaprine (FLEXERIL) 5 MG tablet  3 times daily PRN        07/23/24 2135             Note:  This document was prepared using Dragon voice recognition software and may include unintentional dictation errors.    Loyd Candida LULLA Aldona, PA-C 07/23/24 2227    Floy Roberts, MD 07/23/24 [phone]

## 2024-07-23 NOTE — Discharge Instructions (Addendum)
 Your exam is most consistent with left arm pain and paresthesias likely secondary to some radicular symptoms in your neck or some localized rotator cuff tendinitis.  Take the prescription muscle relaxant as directed.  Take OTC Tylenol  as needed.  Follow-up with your PCP or Ortho as suggested.  Apply moist heat to help reduce symptoms.

## 2024-07-23 NOTE — ED Notes (Signed)
 Green and lav top tubes collected and sent to lab

## 2024-07-29 ENCOUNTER — Encounter: Admitting: Student

## 2024-07-29 NOTE — Progress Notes (Deleted)
 Cardiology Office Note    Date:  07/29/2024   ID:  Holly Garner, DOB 1981/07/23, MRN [id number]  PCP:  Corwin Antu, FNP  Cardiologist:  Evalene Lunger, MD  Electrophysiologist:  None   Chief Complaint: Preoperative cardiac risk stratification  History of Present Illness:   Holly Garner is a 43 y.o. female with history of ***, who presents for preoperative cardiac risk stratification for scheduled panniculectomy on 08/17/2024.  She was seen in the [facility] ED on 07/23/2024 with *** High-sensitivity troponin 6.  No further labs obtained.  She is scheduled to undergo a panniculectomy on 08/17/2024.  ***   [facility] Activity Status Index: *** METs Revised Cardiac Risk Index: ***Risk for noncardiac surgery with an estimated rate of ***% for adverse cardiac event in the periprocedural timeframe   Labs independently reviewed: 03/2024 - potassium 4.7, BUN 17, serum creatinine 0.5, albumin 4.3, AST normal, ALT 45, A1c 5.6, Hgb 12.7, PLT 283, TC 151, TG 75, HDL 44, LDL 91 03/2023 - TSH normal  Past Medical History:  Diagnosis Date   BMI 50.0-59.9, adult (HCC)    CAD (coronary artery disease)    a. 04/2021 NSTEMI/PCI: LM nl, LAD 70m, 30d, D1 nl, LCX 40m, OM1 min irregs, RCA 20p, 30p/m, 100d (2.5x26 Resolute Onyx DES).   Essential hypertension    High cholesterol    History of echocardiogram    a. 04/2021 Echo: EF 60-65%. No rwma. Nl RV fxn.   History of Papanicolaou smear of cervix 04/2011   normal per pt.   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   hgsil   Tobacco abuse    Type II diabetes mellitus (HCC)     Past Surgical History:  Procedure Laterality Date   COLPOSCOPY  09/28/2009   CORONARY STENT INTERVENTION N/A 05/04/2021   Procedure: CORONARY STENT INTERVENTION;  Surgeon: Darron Deatrice LABOR, MD;  Location: [facility] INVASIVE CV LAB;  Service: Cardiovascular;  Laterality: N/A;   GASTRIC BYPASS  04/2022   LEFT HEART CATH AND CORONARY ANGIOGRAPHY N/A 05/04/2021    Procedure: LEFT HEART CATH AND CORONARY ANGIOGRAPHY;  Surgeon: Darron Deatrice LABOR, MD;  Location: [facility] INVASIVE CV LAB;  Service: Cardiovascular;  Laterality: N/A;   TONSILLECTOMY AND ADENOIDECTOMY     TYMPANOSTOMY TUBE PLACEMENT Bilateral     Current Medications: No outpatient medications have been marked as taking for the 07/31/24 encounter (Appointment) with Holly Bernardino HERO, PA-C.    Allergies:   Metoprolol , Omeprazole, Statins, Atorvastatin , Rosuvastatin , Contrast media [iodinated contrast media], and Repatha  [evolocumab ]   Social History   Socioeconomic History   Marital status: Married    Spouse name: Not on file   Number of children: 3   Years of education: 12   Highest education level: Some college, no degree  Occupational History   Occupation: unemployed   Occupation: Dentist: [employer]  Tobacco Use   Smoking status: Former    Current packs/day: 0.00    Average packs/day: 1 pack/day for 10.0 years (10.0 ttl pk-yrs)    Types: Cigarettes    Start date: 05/04/2011    Quit date: 05/03/2021    Years since quitting: 3.2   Smokeless tobacco: Never  Vaping Use   Vaping status: Former   Quit date: 03/19/2022  Substance and Sexual Activity   Alcohol use: No   Drug use: No   Sexual activity: Yes    Partners: Male    Birth control/protection: Surgical, I.U.D.  Comment: Mirena /Vasectomy  Other Topics Concern   Not on file  Social History Narrative   Three kiddos   39, 33, 58    Oldest boy, two younger girls   Social Drivers of Corporate investment banker Strain: Low Risk  (06/27/2024)   Received from [facility]   Overall Financial Resource Strain (CARDIA)    How hard is it for you to pay for the very basics like food, housing, medical care, and heating?: Not hard at all  Food Insecurity: No Food Insecurity (06/27/2024)   Received [facility]   Hunger Vital Sign    Within the past 12 months, you worried that your food would run out  before you got the money to buy more.: Never true    Within the past 12 months, the food you bought just didn't last and you didn't have money to get more.: Never true  Transportation Needs: No Transportation Needs (06/27/2024)   Received from [facility] - Transportation    In the past 12 months, has lack of transportation kept you from medical appointments or from getting medications?: No    In the past 12 months, has lack of transportation kept you from meetings, work, or from getting things needed for daily living?: No  Physical Activity: Sufficiently Active (06/27/2024)   Received from [facility]   Exercise Vital Sign    On average, how many days per week do you engage in moderate to strenuous exercise (like a brisk walk)?: 5 days    On average, how many minutes do you engage in exercise at this level?: 90 min  Stress: No Stress Concern Present (06/27/2024)   Received from [facility] - Occupational Stress Questionnaire    Do you feel stress - tense, restless, nervous, or anxious, or unable to sleep at night because your mind is troubled all the time - these days?: Not at all  Social Connections: Socially Integrated (06/27/2024)   Received from [facility]   Social Network    How would you rate your social network (family, work, friends)?: Good participation with social networks     Family History:  The patient's family history includes Breast cancer in her maternal grandmother; Diabetes in her father and paternal grandfather; Healthy in her mother; Heart attack in her maternal grandfather.  ROS:   12-point review of systems is negative unless otherwise noted in the HPI.   EKGs/Labs/Other Studies Reviewed:    Studies reviewed were summarized above. The additional studies were reviewed today:  LHC 05/04/2021:   Mid Cx lesion is 60% stenosed.   Mid LAD lesion is 60% stenosed.   Dist LAD lesion is 30% stenosed.   Prox RCA  lesion is 20% stenosed.   Prox RCA to Mid RCA lesion is 30% stenosed.   Dist RCA lesion is 100% stenosed.   A drug-eluting stent was successfully placed using a STENT RESOLUTE ONYX 2.5X26.   Post intervention, there is a 0% residual stenosis.   1.  Borderline three-vessel coronary artery disease.  The culprit for non-STEMI is an occluded distal right coronary artery.  In addition, there is moderate mid LAD and mid left circumflex disease estimated to be 60% each. 2.  Left ventricular angiography was not performed.  EF was low normal by echo. 3.  Moderately to severely elevated left ventricular end-diastolic pressure but this was after the patient received aggressive IV hydration precath. 4.  Successful angioplasty  and drug-eluting stent placement to the right coronary artery.   Recommendations:  Dual antiplatelet therapy for at least 12 months. Aggressive treatment of risk factors. Possible discharge home tomorrow if no issues. __________  2D echo 05/04/2021: 1. Left ventricular ejection fraction, by estimation, is 60 to 65%. The  left ventricle has normal function. The left ventricle has no regional  wall motion abnormalities. Left ventricular diastolic parameters were  normal.   2. Right ventricular systolic function is normal. The right ventricular  size is normal.   3. The mitral valve is normal in structure. No evidence of mitral valve  regurgitation. No evidence of mitral stenosis.     EKG:  EKG is ordered today.  The EKG ordered today demonstrates ***  Recent Labs: 03/30/2024: ALT 45; BUN 17; Creatinine, Ser 0.50; Hemoglobin 12.7; Platelets 283.0; Potassium 4.7; Sodium 138  Recent Lipid Panel    Component Value Date/Time   CHOL 151 03/30/2024 1011   CHOL 163 06/15/2021 1547   TRIG 75.0 03/30/2024 1011   HDL 44.80 03/30/2024 1011   HDL 36 (L) 06/15/2021 1547   CHOLHDL 3 03/30/2024 1011   VLDL 15.0 03/30/2024 1011   LDLCALC 91 03/30/2024 1011   LDLCALC 105 (H) 06/15/2021  1547   LDLDIRECT 111 (H) 01/14/2023 1449    PHYSICAL EXAM:    VS:  There were no vitals taken for this visit.  BMI: There is no height or weight on file to calculate BMI.  Physical Exam  Wt Readings from Last 3 Encounters:  07/23/24 163 lb (73.9 kg)  06/18/24 179 lb 6.4 oz (81.4 kg)  06/17/24 177 lb (80.3 kg)     ASSESSMENT & PLAN:   Preprocedure cardiac risk stratification: She is currently scheduled to undergo panniculectomy on 08/17/2024 following significant weight loss with gastric bypass surgery.  Per [facility] Activity Status Index, she can achieve ***METs.  Per Revised Cardiac Risk Index, she is ***risk for noncardiac surgery with an estimated rate of ***% for adverse cardiac event in the periprocedural timeframe.  CAD involving the native coronary arteries with ***:  HTN: Blood pressure  HLD: LDL 91 in 03/2024 with target LDL less than 70.  DM2: A1c 5.6.  Morbid obesity status post gastric bypass surgery:  History of tobacco use:   {Are you ordering a CV Procedure (e.g. stress test, cath, DCCV, TEE, etc)?   Press F2        :[id number]}     Disposition: F/u with Dr. PIERRETTE or an APP in ***.   Medication Adjustments/Labs and Tests Ordered: Current medicines are reviewed at length with the patient today.  Concerns regarding medicines are outlined above. Medication changes, Labs and Tests ordered today are summarized above and listed in the Patient Instructions accessible in Encounters.   Signed, Bernardino Bring, PA-C 07/29/2024 9:35 AM     [facility] [place] [address] [place], [place] [address] [phone]

## 2024-07-30 LAB — NICOTINE SCREEN, URINE: Cotinine Ql Scrn, Ur: NEGATIVE ng/mL

## 2024-07-31 ENCOUNTER — Ambulatory Visit: Admitting: Physician Assistant

## 2024-07-31 DIAGNOSIS — Z01810 Encounter for preprocedural cardiovascular examination: Secondary | ICD-10-CM

## 2024-08-03 ENCOUNTER — Ambulatory Visit: Attending: Physician Assistant | Admitting: Physician Assistant

## 2024-08-03 ENCOUNTER — Encounter: Payer: Self-pay | Admitting: Physician Assistant

## 2024-08-03 VITALS — BP 120/80 | HR 56 | Ht 65.0 in | Wt 175.1 lb

## 2024-08-03 DIAGNOSIS — E785 Hyperlipidemia, unspecified: Secondary | ICD-10-CM | POA: Diagnosis not present

## 2024-08-03 DIAGNOSIS — I10 Essential (primary) hypertension: Secondary | ICD-10-CM | POA: Insufficient documentation

## 2024-08-03 DIAGNOSIS — E1169 Type 2 diabetes mellitus with other specified complication: Secondary | ICD-10-CM | POA: Diagnosis not present

## 2024-08-03 DIAGNOSIS — I2510 Atherosclerotic heart disease of native coronary artery without angina pectoris: Secondary | ICD-10-CM | POA: Insufficient documentation

## 2024-08-03 DIAGNOSIS — Z01810 Encounter for preprocedural cardiovascular examination: Secondary | ICD-10-CM | POA: Insufficient documentation

## 2024-08-03 NOTE — H&P (View-Only) (Signed)
 Cardiology Office Note    Date:  08/03/2024   ID:  Holly Garner, DOB 18-Sep-1981, MRN [id number]  PCP:  Corwin Antu, FNP  Cardiologist:  Evalene Lunger, MD  Electrophysiologist:  None   Chief Complaint: Preoperative cardiac risk stratification  History of Present Illness:   Holly Garner is a 43 y.o. female with history of CAD with NSTEMI status post PCI/DES to the RCA in 04/2021, DM2, HTN, HLD with statin and PCSK9 inhibitor intolerance, morbid obesity status post gastric bypass, and prior tobacco use, who presents for preoperative cardiac risk stratification for scheduled panniculectomy on 08/17/2024.  She scented to [facility] in 04/2021 with a 3-week history of chest pain.  EKG at that time showed diffuse T wave inversions.  Troponin was elevated.  CTA of the chest was unremarkable.  She left AMA.  She returned later in 04/2021 with worsening chest pain.  Troponin trended to 7355.  Echo showed an EF of 60 to 65% without regional wall motion abnormalities.  LHC revealed moderate diffuse multivessel CAD with a total occlusion of the distal RCA treated with PCI/DES.  She has not required ischemic evaluation since.  She was seen in the [facility] ED on 07/23/2024 with intermittent left arm pain described as sharp and dull with some associated numbness and tingling.  EKG showed sinus rhythm without acute ST-T changes.  High-sensitivity troponin 6.  No further labs obtained.  Symptoms were felt to be musculoskeletal/neuropathic in etiology with recommendation for patient to follow-up with orthopedics.  She was prescribed cyclobenzaprine.  She is scheduled to undergo a panniculectomy on 08/17/2024.  She comes in doing very well from a cardiac perspective, and is without symptoms of angina or cardiac decompensation.  Bilateral shoulder discomfort with, left being greater than right, felt to be secondary to increase in weight lifting at the gym.  With exercise, she has been without symptoms of angina,  no further shoulder discomfort.  Symptoms did not feel similar to prior angina.  No lower extremity swelling or progressive orthopnea.  No falls or symptoms concerning for bleeding.   [facility] Activity Status Index: > 4 METs Revised Cardiac Risk Index: low risk for noncardiac surgery with an estimated rate of 0.9% for adverse cardiac event in the periprocedural timeframe   Labs independently reviewed: 03/2024 - potassium 4.7, BUN 17, serum creatinine 0.5, albumin 4.3, AST normal, ALT 45, A1c 5.6, Hgb 12.7, PLT 283, TC 151, TG 75, HDL 44, LDL 91 03/2023 - TSH normal  Past Medical History:  Diagnosis Date   BMI 50.0-59.9, adult (HCC)    CAD (coronary artery disease)    a. 04/2021 NSTEMI/PCI: LM nl, LAD 47m, 30d, D1 nl, LCX 47m, OM1 min irregs, RCA 20p, 30p/m, 100d (2.5x26 Resolute Onyx DES).   Essential hypertension    High cholesterol    History of echocardiogram    a. 04/2021 Echo: EF 60-65%. No rwma. Nl RV fxn.   History of Papanicolaou smear of cervix 04/2011   normal per pt.   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   hgsil   Tobacco abuse    Type II diabetes mellitus (HCC)     Past Surgical History:  Procedure Laterality Date   COLPOSCOPY  09/28/2009   CORONARY STENT INTERVENTION N/A 05/04/2021   Procedure: CORONARY STENT INTERVENTION;  Surgeon: Darron Deatrice LABOR, MD;  Location: [facility] INVASIVE CV LAB;  Service: Cardiovascular;  Laterality: N/A;   GASTRIC BYPASS  04/2022   LEFT HEART CATH AND CORONARY  ANGIOGRAPHY N/A 05/04/2021   Procedure: LEFT HEART CATH AND CORONARY ANGIOGRAPHY;  Surgeon: Darron Deatrice LABOR, MD;  Location: [facility] INVASIVE CV LAB;  Service: Cardiovascular;  Laterality: N/A;   TONSILLECTOMY AND ADENOIDECTOMY     TYMPANOSTOMY TUBE PLACEMENT Bilateral     Current Medications: Current Meds  Medication Sig   aspirin  81 MG chewable tablet Chew 1 tablet (81 mg total) by mouth daily.   Calcium  Carb-Cholecalciferol (CALCIUM  + VITAMIN D3) 500-5 MG-MCG TABS  Take by mouth.   cyclobenzaprine (FLEXERIL) 5 MG tablet Take 1 tablet (5 mg total) by mouth 3 (three) times daily as needed.   EPINEPHrine  0.3 mg/0.3 mL IJ SOAJ injection Inject 0.3 mg into the muscle as needed for anaphylaxis.   ferrous sulfate  325 (65 FE) MG tablet Take 325 mg by mouth daily with breakfast.   levonorgestrel  (MIRENA ) 20 MCG/DAY IUD 1 each by Intrauterine route once.   Multiple Vitamin (MULTIVITAMIN) tablet Take 1 tablet by mouth daily.    Allergies:   Metoprolol , Omeprazole, Statins, Atorvastatin , Rosuvastatin , Contrast media [iodinated contrast media], and Repatha  [evolocumab ]   Social History   Socioeconomic History   Marital status: Married    Spouse name: Not on file   Number of children: 3   Years of education: 12   Highest education level: Some college, no degree  Occupational History   Occupation: unemployed   Occupation: Dentist: [employer]  Tobacco Use   Smoking status: Former    Current packs/day: 0.00    Average packs/day: 1 pack/day for 10.0 years (10.0 ttl pk-yrs)    Types: Cigarettes    Start date: 05/04/2011    Quit date: 05/03/2021    Years since quitting: 3.2   Smokeless tobacco: Never  Vaping Use   Vaping status: Former   Quit date: 03/19/2022  Substance and Sexual Activity   Alcohol use: No   Drug use: No   Sexual activity: Yes    Partners: Male    Birth control/protection: Surgical, I.U.D.    Comment: Mirena /Vasectomy  Other Topics Concern   Not on file  Social History Narrative   Three kiddos   19, 106, 26    Oldest boy, two younger girls   Social Drivers of Corporate investment banker Strain: Low Risk  (06/27/2024)   Received from [facility]   Overall Financial Resource Strain (CARDIA)    How hard is it for you to pay for the very basics like food, housing, medical care, and heating?: Not hard at all  Food Insecurity: No Food Insecurity (06/27/2024)   Received from [facility]   Hunger Vital Sign     Within the past 12 months, you worried that your food would run out before you got the money to buy more.: Never true    Within the past 12 months, the food you bought just didn't last and you didn't have money to get more.: Never true  Transportation Needs: No Transportation Needs (06/27/2024)   Received from [facility] - Transportation    In the past 12 months, has lack of transportation kept you from medical appointments or from getting medications?: No    In the past 12 months, has lack of transportation kept you from meetings, work, or from getting things needed for daily living?: No  Physical Activity: Sufficiently Active (06/27/2024)   Received from [facility]   Exercise Vital Sign    On average, how many days per week do  you engage in moderate to strenuous exercise (like a brisk walk)?: 5 days    On average, how many minutes do you engage in exercise at this level?: 90 min  Stress: No Stress Concern Present (06/27/2024)   Received from [facility] - Occupational Stress Questionnaire    Do you feel stress - tense, restless, nervous, or anxious, or unable to sleep at night because your mind is troubled all the time - these days?: Not at all  Social Connections: Socially Integrated (06/27/2024)   Received from [facility]   Social Network    How would you rate your social network (family, work, friends)?: Good participation with social networks     Family History:  The patient's family history includes Breast cancer in her maternal grandmother; Diabetes in her father and paternal grandfather; Healthy in her mother; Heart attack in her maternal grandfather.  ROS:   12-point review of systems is negative unless otherwise noted in the HPI.   EKGs/Labs/Other Studies Reviewed:    Studies reviewed were summarized above. The additional studies were reviewed today:  LHC 05/04/2021:   Mid Cx lesion is 60% stenosed.   Mid LAD lesion  is 60% stenosed.   Dist LAD lesion is 30% stenosed.   Prox RCA lesion is 20% stenosed.   Prox RCA to Mid RCA lesion is 30% stenosed.   Dist RCA lesion is 100% stenosed.   A drug-eluting stent was successfully placed using a STENT RESOLUTE ONYX 2.5X26.   Post intervention, there is a 0% residual stenosis.   1.  Borderline three-vessel coronary artery disease.  The culprit for non-STEMI is an occluded distal right coronary artery.  In addition, there is moderate mid LAD and mid left circumflex disease estimated to be 60% each. 2.  Left ventricular angiography was not performed.  EF was low normal by echo. 3.  Moderately to severely elevated left ventricular end-diastolic pressure but this was after the patient received aggressive IV hydration precath. 4.  Successful angioplasty and drug-eluting stent placement to the right coronary artery.   Recommendations:  Dual antiplatelet therapy for at least 12 months. Aggressive treatment of risk factors. Possible discharge home tomorrow if no issues. __________  2D echo 05/04/2021: 1. Left ventricular ejection fraction, by estimation, is 60 to 65%. The  left ventricle has normal function. The left ventricle has no regional  wall motion abnormalities. Left ventricular diastolic parameters were  normal.   2. Right ventricular systolic function is normal. The right ventricular  size is normal.   3. The mitral valve is normal in structure. No evidence of mitral valve  regurgitation. No evidence of mitral stenosis.     EKG:  EKG is ordered today.  The EKG ordered today demonstrates sinus bradycardia cardia, 56 bpm, no acute ST-T changes  Recent Labs: 03/30/2024: ALT 45; BUN 17; Creatinine, Ser 0.50; Hemoglobin 12.7; Platelets 283.0; Potassium 4.7; Sodium 138  Recent Lipid Panel    Component Value Date/Time   CHOL 151 03/30/2024 1011   CHOL 163 06/15/2021 1547   TRIG 75.0 03/30/2024 1011   HDL 44.80 03/30/2024 1011   HDL 36 (L) 06/15/2021 1547    CHOLHDL 3 03/30/2024 1011   VLDL 15.0 03/30/2024 1011   LDLCALC 91 03/30/2024 1011   LDLCALC 105 (H) 06/15/2021 1547   LDLDIRECT 111 (H) 01/14/2023 1449    PHYSICAL EXAM:    VS:  BP 120/80 (BP Location: Left Arm, Patient Position: Sitting, Cuff Size:  Normal)   Pulse (!) 56   Ht 5' 5 (1.651 m)   Wt 175 lb 2 oz (79.4 kg)   SpO2 98%   BMI 29.14 kg/m   BMI: Body mass index is 29.14 kg/m.  Physical Exam Vitals reviewed.  Constitutional:      Appearance: She is well-developed.  HENT:     Head: Normocephalic and atraumatic.  Eyes:     General:        Right eye: No discharge.        Left eye: No discharge.  Cardiovascular:     Rate and Rhythm: Normal rate and regular rhythm.     Heart sounds: Normal heart sounds, S1 normal and S2 normal. Heart sounds not distant. No midsystolic click and no opening snap. No murmur heard.    No friction rub.  Pulmonary:     Effort: Pulmonary effort is normal. No respiratory distress.     Breath sounds: Normal breath sounds. No decreased breath sounds, wheezing, rhonchi or rales.  Musculoskeletal:     Cervical back: Normal range of motion.     Right lower leg: No edema.     Left lower leg: No edema.  Skin:    General: Skin is warm and dry.     Nails: There is no clubbing.  Neurological:     Mental Status: She is alert and oriented to person, place, and time.  Psychiatric:        Speech: Speech normal.        Behavior: Behavior normal.        Thought Content: Thought content normal.        Judgment: Judgment normal.     Wt Readings from Last 3 Encounters:  08/03/24 175 lb 2 oz (79.4 kg)  07/23/24 163 lb (73.9 kg)  06/18/24 179 lb 6.4 oz (81.4 kg)     ASSESSMENT & PLAN:   Preprocedure cardiac risk stratification: She is currently scheduled to undergo panniculectomy on 08/17/2024 following significant weight loss with gastric bypass surgery.  Per [facility] Activity Status Index, she can achieve > 4 METs.  Per Revised Cardiac Risk Index,  she is low risk for noncardiac surgery with an estimated rate of 0.9% for adverse cardiac event in the periprocedural timeframe.  She is without symptoms of angina or cardiac decompensation.  She may proceed with noncardiac surgery at an overall low risk without further cardiac testing.  Recommend she continue aspirin  81 mg daily throughout the periprocedural timeframe unless the bleeding risk is deemed to be too great.  CAD involving the native coronary arteries without angina: She is doing well without symptoms concerning for angina or cardiac decompensation.  Bilateral shoulder discomfort appears to be musculoskeletal in etiology following recent increase in weight lifting.  EKG nonacute.  High-sensitivity troponin negative.  No further symptoms.  Continue aspirin  81 mg daily.  Not on statin/lipid-lowering therapy as outlined below.  HTN: Blood pressure is well-controlled in the office today.  Not currently requiring antihypertensive pharmacotherapy.  HLD with statin and PCSK9 inhibitor intolerance: LDL 91 in 03/2024 with target LDL less than 70.  Documented intolerance to statins and PCSK9 inhibitor secondary to hives.  Recommend revisiting lipid-lowering therapy with bempedoic acid or inclisiran in follow-up.  DM2: A1c 5.6.  Management per PCP.  History of morbid obesity status post gastric bypass surgery: Dramatic weight loss following gastric bypass leading to plans for panniculectomy as outlined above.  History of tobacco use: Abstaining.    Disposition: F/u with Dr.  Gollan 4-6 months.   Medication Adjustments/Labs and Tests Ordered: Current medicines are reviewed at length with the patient today.  Concerns regarding medicines are outlined above. Medication changes, Labs and Tests ordered today are summarized above and listed in the Patient Instructions accessible in Encounters.   Signed, Bernardino Bring, PA-C 08/03/2024 4:18 PM     [facility] [place] [address] [place], [place] [address] [phone]

## 2024-08-03 NOTE — Patient Instructions (Signed)
 Medication Instructions:  Your physician recommends that you continue on your current medications as directed. Please refer to the Current Medication list given to you today.   *If you need a refill on your cardiac medications before your next appointment, please call your pharmacy*  Lab Work: None ordered at this time   Follow-Up: At [facility], you and your health needs are our priority.  As part of our continuing mission to provide you with exceptional heart care, our providers are all part of one team.  This team includes your primary Cardiologist (physician) and Advanced Practice Providers or APPs (Physician Assistants and Nurse Practitioners) who all work together to provide you with the care you need, when you need it.  Your next appointment:   4-6 month(s) (move the Nov 6 appt to this new timeframe)  Provider:   You may see Timothy Gollan, MD

## 2024-08-03 NOTE — Progress Notes (Signed)
 Cardiology Office Note    Date:  08/03/2024   ID:  Holly Garner, DOB 18-Sep-1981, MRN [id number]  PCP:  Corwin Antu, FNP  Cardiologist:  Evalene Lunger, MD  Electrophysiologist:  None   Chief Complaint: Preoperative cardiac risk stratification  History of Present Illness:   Holly Garner is a 43 y.o. female with history of CAD with NSTEMI status post PCI/DES to the RCA in 04/2021, DM2, HTN, HLD with statin and PCSK9 inhibitor intolerance, morbid obesity status post gastric bypass, and prior tobacco use, who presents for preoperative cardiac risk stratification for scheduled panniculectomy on 08/17/2024.  She scented to [facility] in 04/2021 with a 3-week history of chest pain.  EKG at that time showed diffuse T wave inversions.  Troponin was elevated.  CTA of the chest was unremarkable.  She left AMA.  She returned later in 04/2021 with worsening chest pain.  Troponin trended to 7355.  Echo showed an EF of 60 to 65% without regional wall motion abnormalities.  LHC revealed moderate diffuse multivessel CAD with a total occlusion of the distal RCA treated with PCI/DES.  She has not required ischemic evaluation since.  She was seen in the [facility] ED on 07/23/2024 with intermittent left arm pain described as sharp and dull with some associated numbness and tingling.  EKG showed sinus rhythm without acute ST-T changes.  High-sensitivity troponin 6.  No further labs obtained.  Symptoms were felt to be musculoskeletal/neuropathic in etiology with recommendation for patient to follow-up with orthopedics.  She was prescribed cyclobenzaprine.  She is scheduled to undergo a panniculectomy on 08/17/2024.  She comes in doing very well from a cardiac perspective, and is without symptoms of angina or cardiac decompensation.  Bilateral shoulder discomfort with, left being greater than right, felt to be secondary to increase in weight lifting at the gym.  With exercise, she has been without symptoms of angina,  no further shoulder discomfort.  Symptoms did not feel similar to prior angina.  No lower extremity swelling or progressive orthopnea.  No falls or symptoms concerning for bleeding.   [facility] Activity Status Index: > 4 METs Revised Cardiac Risk Index: low risk for noncardiac surgery with an estimated rate of 0.9% for adverse cardiac event in the periprocedural timeframe   Labs independently reviewed: 03/2024 - potassium 4.7, BUN 17, serum creatinine 0.5, albumin 4.3, AST normal, ALT 45, A1c 5.6, Hgb 12.7, PLT 283, TC 151, TG 75, HDL 44, LDL 91 03/2023 - TSH normal  Past Medical History:  Diagnosis Date   BMI 50.0-59.9, adult (HCC)    CAD (coronary artery disease)    a. 04/2021 NSTEMI/PCI: LM nl, LAD 47m, 30d, D1 nl, LCX 47m, OM1 min irregs, RCA 20p, 30p/m, 100d (2.5x26 Resolute Onyx DES).   Essential hypertension    High cholesterol    History of echocardiogram    a. 04/2021 Echo: EF 60-65%. No rwma. Nl RV fxn.   History of Papanicolaou smear of cervix 04/2011   normal per pt.   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   hgsil   Tobacco abuse    Type II diabetes mellitus (HCC)     Past Surgical History:  Procedure Laterality Date   COLPOSCOPY  09/28/2009   CORONARY STENT INTERVENTION N/A 05/04/2021   Procedure: CORONARY STENT INTERVENTION;  Surgeon: Darron Deatrice LABOR, MD;  Location: [facility] INVASIVE CV LAB;  Service: Cardiovascular;  Laterality: N/A;   GASTRIC BYPASS  04/2022   LEFT HEART CATH AND CORONARY  ANGIOGRAPHY N/A 05/04/2021   Procedure: LEFT HEART CATH AND CORONARY ANGIOGRAPHY;  Surgeon: Darron Deatrice LABOR, MD;  Location: [facility] INVASIVE CV LAB;  Service: Cardiovascular;  Laterality: N/A;   TONSILLECTOMY AND ADENOIDECTOMY     TYMPANOSTOMY TUBE PLACEMENT Bilateral     Current Medications: Current Meds  Medication Sig   aspirin  81 MG chewable tablet Chew 1 tablet (81 mg total) by mouth daily.   Calcium  Carb-Cholecalciferol (CALCIUM  + VITAMIN D3) 500-5 MG-MCG TABS  Take by mouth.   cyclobenzaprine (FLEXERIL) 5 MG tablet Take 1 tablet (5 mg total) by mouth 3 (three) times daily as needed.   EPINEPHrine  0.3 mg/0.3 mL IJ SOAJ injection Inject 0.3 mg into the muscle as needed for anaphylaxis.   ferrous sulfate  325 (65 FE) MG tablet Take 325 mg by mouth daily with breakfast.   levonorgestrel  (MIRENA ) 20 MCG/DAY IUD 1 each by Intrauterine route once.   Multiple Vitamin (MULTIVITAMIN) tablet Take 1 tablet by mouth daily.    Allergies:   Metoprolol , Omeprazole, Statins, Atorvastatin , Rosuvastatin , Contrast media [iodinated contrast media], and Repatha  [evolocumab ]   Social History   Socioeconomic History   Marital status: Married    Spouse name: Not on file   Number of children: 3   Years of education: 12   Highest education level: Some college, no degree  Occupational History   Occupation: unemployed   Occupation: Dentist: [employer]  Tobacco Use   Smoking status: Former    Current packs/day: 0.00    Average packs/day: 1 pack/day for 10.0 years (10.0 ttl pk-yrs)    Types: Cigarettes    Start date: 05/04/2011    Quit date: 05/03/2021    Years since quitting: 3.2   Smokeless tobacco: Never  Vaping Use   Vaping status: Former   Quit date: 03/19/2022  Substance and Sexual Activity   Alcohol use: No   Drug use: No   Sexual activity: Yes    Partners: Male    Birth control/protection: Surgical, I.U.D.    Comment: Mirena /Vasectomy  Other Topics Concern   Not on file  Social History Narrative   Three kiddos   19, 106, 26    Oldest boy, two younger girls   Social Drivers of Corporate investment banker Strain: Low Risk  (06/27/2024)   Received from [facility]   Overall Financial Resource Strain (CARDIA)    How hard is it for you to pay for the very basics like food, housing, medical care, and heating?: Not hard at all  Food Insecurity: No Food Insecurity (06/27/2024)   Received from [facility]   Hunger Vital Sign     Within the past 12 months, you worried that your food would run out before you got the money to buy more.: Never true    Within the past 12 months, the food you bought just didn't last and you didn't have money to get more.: Never true  Transportation Needs: No Transportation Needs (06/27/2024)   Received from [facility] - Transportation    In the past 12 months, has lack of transportation kept you from medical appointments or from getting medications?: No    In the past 12 months, has lack of transportation kept you from meetings, work, or from getting things needed for daily living?: No  Physical Activity: Sufficiently Active (06/27/2024)   Received from [facility]   Exercise Vital Sign    On average, how many days per week do  you engage in moderate to strenuous exercise (like a brisk walk)?: 5 days    On average, how many minutes do you engage in exercise at this level?: 90 min  Stress: No Stress Concern Present (06/27/2024)   Received from [facility] - Occupational Stress Questionnaire    Do you feel stress - tense, restless, nervous, or anxious, or unable to sleep at night because your mind is troubled all the time - these days?: Not at all  Social Connections: Socially Integrated (06/27/2024)   Received from [facility]   Social Network    How would you rate your social network (family, work, friends)?: Good participation with social networks     Family History:  The patient's family history includes Breast cancer in her maternal grandmother; Diabetes in her father and paternal grandfather; Healthy in her mother; Heart attack in her maternal grandfather.  ROS:   12-point review of systems is negative unless otherwise noted in the HPI.   EKGs/Labs/Other Studies Reviewed:    Studies reviewed were summarized above. The additional studies were reviewed today:  LHC 05/04/2021:   Mid Cx lesion is 60% stenosed.   Mid LAD lesion  is 60% stenosed.   Dist LAD lesion is 30% stenosed.   Prox RCA lesion is 20% stenosed.   Prox RCA to Mid RCA lesion is 30% stenosed.   Dist RCA lesion is 100% stenosed.   A drug-eluting stent was successfully placed using a STENT RESOLUTE ONYX 2.5X26.   Post intervention, there is a 0% residual stenosis.   1.  Borderline three-vessel coronary artery disease.  The culprit for non-STEMI is an occluded distal right coronary artery.  In addition, there is moderate mid LAD and mid left circumflex disease estimated to be 60% each. 2.  Left ventricular angiography was not performed.  EF was low normal by echo. 3.  Moderately to severely elevated left ventricular end-diastolic pressure but this was after the patient received aggressive IV hydration precath. 4.  Successful angioplasty and drug-eluting stent placement to the right coronary artery.   Recommendations:  Dual antiplatelet therapy for at least 12 months. Aggressive treatment of risk factors. Possible discharge home tomorrow if no issues. __________  2D echo 05/04/2021: 1. Left ventricular ejection fraction, by estimation, is 60 to 65%. The  left ventricle has normal function. The left ventricle has no regional  wall motion abnormalities. Left ventricular diastolic parameters were  normal.   2. Right ventricular systolic function is normal. The right ventricular  size is normal.   3. The mitral valve is normal in structure. No evidence of mitral valve  regurgitation. No evidence of mitral stenosis.     EKG:  EKG is ordered today.  The EKG ordered today demonstrates sinus bradycardia cardia, 56 bpm, no acute ST-T changes  Recent Labs: 03/30/2024: ALT 45; BUN 17; Creatinine, Ser 0.50; Hemoglobin 12.7; Platelets 283.0; Potassium 4.7; Sodium 138  Recent Lipid Panel    Component Value Date/Time   CHOL 151 03/30/2024 1011   CHOL 163 06/15/2021 1547   TRIG 75.0 03/30/2024 1011   HDL 44.80 03/30/2024 1011   HDL 36 (L) 06/15/2021 1547    CHOLHDL 3 03/30/2024 1011   VLDL 15.0 03/30/2024 1011   LDLCALC 91 03/30/2024 1011   LDLCALC 105 (H) 06/15/2021 1547   LDLDIRECT 111 (H) 01/14/2023 1449    PHYSICAL EXAM:    VS:  BP 120/80 (BP Location: Left Arm, Patient Position: Sitting, Cuff Size:  Normal)   Pulse (!) 56   Ht 5' 5 (1.651 m)   Wt 175 lb 2 oz (79.4 kg)   SpO2 98%   BMI 29.14 kg/m   BMI: Body mass index is 29.14 kg/m.  Physical Exam Vitals reviewed.  Constitutional:      Appearance: She is well-developed.  HENT:     Head: Normocephalic and atraumatic.  Eyes:     General:        Right eye: No discharge.        Left eye: No discharge.  Cardiovascular:     Rate and Rhythm: Normal rate and regular rhythm.     Heart sounds: Normal heart sounds, S1 normal and S2 normal. Heart sounds not distant. No midsystolic click and no opening snap. No murmur heard.    No friction rub.  Pulmonary:     Effort: Pulmonary effort is normal. No respiratory distress.     Breath sounds: Normal breath sounds. No decreased breath sounds, wheezing, rhonchi or rales.  Musculoskeletal:     Cervical back: Normal range of motion.     Right lower leg: No edema.     Left lower leg: No edema.  Skin:    General: Skin is warm and dry.     Nails: There is no clubbing.  Neurological:     Mental Status: She is alert and oriented to person, place, and time.  Psychiatric:        Speech: Speech normal.        Behavior: Behavior normal.        Thought Content: Thought content normal.        Judgment: Judgment normal.     Wt Readings from Last 3 Encounters:  08/03/24 175 lb 2 oz (79.4 kg)  07/23/24 163 lb (73.9 kg)  06/18/24 179 lb 6.4 oz (81.4 kg)     ASSESSMENT & PLAN:   Preprocedure cardiac risk stratification: She is currently scheduled to undergo panniculectomy on 08/17/2024 following significant weight loss with gastric bypass surgery.  Per [facility] Activity Status Index, she can achieve > 4 METs.  Per Revised Cardiac Risk Index,  she is low risk for noncardiac surgery with an estimated rate of 0.9% for adverse cardiac event in the periprocedural timeframe.  She is without symptoms of angina or cardiac decompensation.  She may proceed with noncardiac surgery at an overall low risk without further cardiac testing.  Recommend she continue aspirin  81 mg daily throughout the periprocedural timeframe unless the bleeding risk is deemed to be too great.  CAD involving the native coronary arteries without angina: She is doing well without symptoms concerning for angina or cardiac decompensation.  Bilateral shoulder discomfort appears to be musculoskeletal in etiology following recent increase in weight lifting.  EKG nonacute.  High-sensitivity troponin negative.  No further symptoms.  Continue aspirin  81 mg daily.  Not on statin/lipid-lowering therapy as outlined below.  HTN: Blood pressure is well-controlled in the office today.  Not currently requiring antihypertensive pharmacotherapy.  HLD with statin and PCSK9 inhibitor intolerance: LDL 91 in 03/2024 with target LDL less than 70.  Documented intolerance to statins and PCSK9 inhibitor secondary to hives.  Recommend revisiting lipid-lowering therapy with bempedoic acid or inclisiran in follow-up.  DM2: A1c 5.6.  Management per PCP.  History of morbid obesity status post gastric bypass surgery: Dramatic weight loss following gastric bypass leading to plans for panniculectomy as outlined above.  History of tobacco use: Abstaining.    Disposition: F/u with Dr.  Gollan 4-6 months.   Medication Adjustments/Labs and Tests Ordered: Current medicines are reviewed at length with the patient today.  Concerns regarding medicines are outlined above. Medication changes, Labs and Tests ordered today are summarized above and listed in the Patient Instructions accessible in Encounters.   Signed, Bernardino Bring, PA-C 08/03/2024 4:18 PM     [facility] [place] [address] [place], [place] [address] [phone]

## 2024-08-05 ENCOUNTER — Ambulatory Visit: Admitting: Student

## 2024-08-10 ENCOUNTER — Other Ambulatory Visit: Payer: Self-pay

## 2024-08-10 ENCOUNTER — Encounter (HOSPITAL_BASED_OUTPATIENT_CLINIC_OR_DEPARTMENT_OTHER): Payer: Self-pay

## 2024-08-10 NOTE — Progress Notes (Signed)
   08/10/24 1101  PAT Phone Screen  Is the patient taking a GLP-1 receptor agonist? No  Do You Have Diabetes? Yes (03/30/24 A1C 5.6 no meds)  Do You Have Hypertension? Yes  Have You Ever Been to the ER for Asthma? No  Have You Taken Oral Steroids in the Past 3 Months? No  Do you Take Phenteramine or any Other Diet Drugs? No  Recent  Lab Work, EKG, CXR? Yes  Where was this test performed? EKG 08/03/24 [facility]  Do you have a history of heart problems? Yes  Cardiologist Name Cardiolgy clearance 08/03/24  Have you ever had tests on your heart? Yes  What cardiac tests were performed? Cardiac Cath;Echo  What date/year were cardiac tests completed? DEF to RCA 05/04/21 Echo EF 60-65%  Results viewable: [facility] Media Tab  Any Recent Hospitalizations? No  Height 5' 5 (1.651 m)  Weight 79.4 kg  Pat Appointment Scheduled No  Reason for No Appointment Not Needed   Pt to con't 81mg  aspirin  as directed by cardiology.

## 2024-08-12 ENCOUNTER — Ambulatory Visit: Admitting: Student

## 2024-08-12 VITALS — BP 111/59 | HR 65 | Ht 65.0 in | Wt 178.0 lb

## 2024-08-12 DIAGNOSIS — E65 Localized adiposity: Secondary | ICD-10-CM | POA: Diagnosis not present

## 2024-08-12 DIAGNOSIS — I252 Old myocardial infarction: Secondary | ICD-10-CM | POA: Diagnosis not present

## 2024-08-12 DIAGNOSIS — M793 Panniculitis, unspecified: Secondary | ICD-10-CM

## 2024-08-12 MED ORDER — GABAPENTIN 300 MG PO CAPS: 300.0000 mg | ORAL_CAPSULE | Freq: Two times a day (BID) | ORAL | 0 refills | Status: DC

## 2024-08-12 MED ORDER — ACETAMINOPHEN 500 MG PO TABS: 500.0000 mg | ORAL_TABLET | Freq: Four times a day (QID) | ORAL | 0 refills | Status: AC | PRN

## 2024-08-12 MED ORDER — OXYCODONE HCL 5 MG PO TABS: 5.0000 mg | ORAL_TABLET | Freq: Four times a day (QID) | ORAL | 0 refills | Status: DC | PRN

## 2024-08-12 MED ORDER — ONDANSETRON HCL 4 MG PO TABS: 4.0000 mg | ORAL_TABLET | Freq: Three times a day (TID) | ORAL | 0 refills | Status: AC | PRN

## 2024-08-12 NOTE — Progress Notes (Signed)
 Patient ID: Holly Garner, female    DOB: 29-Apr-1981, 43 y.o.   MRN: [id number]  Preoperative appointment     ICD-10-CM   1. Panniculus  E65        History of Present Illness: Holly Garner is a 43 y.o.  female  with a history of panniculitis.  She presents for preoperative evaluation for upcoming procedure, panniculectomy, scheduled for 08/17/2024 with Dr. Montorfano.  The patient has not had problems with anesthesia.  Patient does report heart attack back in July 2022.  She denies any issues since then.  She does state that she takes aspirin , she denies taking any other blood thinners.  She reports that she stopped smoking after her consult with Dr. Montorfano.   Patient reports she currently has an IUD in place.  She denies any history of more than 3 miscarriages.  She denies any personal or family history of blood clots or clotting diseases.  She denies any recent surgeries, traumas or infections.  She denies any history of stroke.  She denies any history of Crohn's disease or ulcerative colitis, COPD or asthma.  She denies any history of cancer.  She denies any varicosities to her lower extremities.  She denies any recent fevers, chills or changes in her health.  Summary of Previous Visit: Patient was seen for initial consult by Dr. Montorfano on 06/17/2024.  At this visit, it was noted that patient underwent laparoscopic RYGB in 2013.  Since then, patient had almost lost 195 pounds.  Patient noted excess skin involving her abdomen and complained of rashes in between the skin folds.  It was reported that patient does vape.  It was also noted that patient had history of CAD and NSTEMI for which patient would need cardiology clearance if she were to proceed with surgery.  It was noted that patient would be a good candidate for panniculectomy, possible fleur-de-lis abdominoplasty and thighplasty.  Thighs would be addressed at a later date.    Per chart review, patient saw cardiology  on 08/03/2024.  Per cardiology: Preprocedure cardiac risk stratification: She is currently scheduled to undergo panniculectomy on 08/17/2024 following significant weight loss with gastric bypass surgery.  Per [facility] Activity Status Index, she can achieve > 4 METs.  Per Revised Cardiac Risk Index, she is low risk for noncardiac surgery with an estimated rate of 0.9% for adverse cardiac event in the periprocedural timeframe.  She is without symptoms of angina or cardiac decompensation.  She may proceed with noncardiac surgery at an overall low risk without further cardiac testing.  Recommend she continue aspirin  81 mg daily throughout the periprocedural timeframe unless the bleeding risk is deemed to be too great.  I spoke with Dr. Montorfano about cardiology's recommendation for patient to continue aspirin .  He states that he is okay with patient remaining on her aspirin  throughout the perioperative period  Per chart review, telephone note on 07/14/2024 states that clinical staff spoke with Dr. Montorfano and he was fine with patient not having PCP clearance, but would need cardiology clearance.  Job: [employer], planning to take 2 weeks off.  She understands that she will have restrictions for 6 weeks.  All  PMH Significant for: Hypertension, type 2 diabetes, NSTEMI, anemia, history of gastric bypass, panniculitis  Patient had nicotine  test completed on 07/28/2024 which was negative  Patient also had CBC completed on 03/30/2024 which showed hemoglobin of 12.7, she also had A1c completed at the same time which was  5.6.   Past Medical History: Allergies: Allergies  Allergen Reactions   Metoprolol  Hives   Omeprazole Hives   Statins Hives, Itching and Other (See Comments)    Other reaction(s): Unknown    Atorvastatin  Hives and Itching   Rosuvastatin  Hives and Itching   Contrast Media [Iodinated Contrast Media] Itching   Repatha  [Evolocumab ] Hives    Current Medications:  Current  Outpatient Medications:    acetaminophen  (TYLENOL ) 500 MG tablet, Take 1 tablet (500 mg total) by mouth every 6 (six) hours as needed for up to 30 doses for mild pain (pain score 1-3) or moderate pain (pain score 4-6)., Disp: 30 tablet, Rfl: 0   gabapentin (NEURONTIN) 300 MG capsule, Take 1 capsule (300 mg total) by mouth 2 (two) times daily for 14 doses., Disp: 14 capsule, Rfl: 0   ondansetron  (ZOFRAN ) 4 MG tablet, Take 1 tablet (4 mg total) by mouth every 8 (eight) hours as needed for up to 20 doses for nausea or vomiting., Disp: 20 tablet, Rfl: 0   oxyCODONE  (ROXICODONE ) 5 MG immediate release tablet, Take 1 tablet (5 mg total) by mouth every 6 (six) hours as needed for up to 15 doses for severe pain (pain score 7-10)., Disp: 15 tablet, Rfl: 0   aspirin  81 MG chewable tablet, Chew 1 tablet (81 mg total) by mouth daily., Disp: 30 tablet, Rfl: 0   Calcium  Carb-Cholecalciferol (CALCIUM  + VITAMIN D3) 500-5 MG-MCG TABS, Take by mouth., Disp: , Rfl:    cyclobenzaprine (FLEXERIL) 5 MG tablet, Take 1 tablet (5 mg total) by mouth 3 (three) times daily as needed. (Patient not taking: Reported on 08/10/2024), Disp: 15 tablet, Rfl: 0   EPINEPHrine  0.3 mg/0.3 mL IJ SOAJ injection, Inject 0.3 mg into the muscle as needed for anaphylaxis., Disp: 1 each, Rfl: 0   ferrous sulfate  325 (65 FE) MG tablet, Take 325 mg by mouth daily with breakfast., Disp: , Rfl:    levonorgestrel  (MIRENA ) 20 MCG/DAY IUD, 1 each by Intrauterine route once., Disp: , Rfl:    Multiple Vitamin (MULTIVITAMIN) tablet, Take 1 tablet by mouth daily., Disp: , Rfl:   Past Medical Problems: Past Medical History:  Diagnosis Date   Anxiety    BMI 50.0-59.9, adult (HCC)    CAD (coronary artery disease)    a. 04/2021 NSTEMI/PCI: LM nl, LAD 59m, 30d, D1 nl, LCX 31m, OM1 min irregs, RCA 20p, 30p/m, 100d (2.5x26 Resolute Onyx DES).   Essential hypertension    High cholesterol    History of echocardiogram    a. 04/2021 Echo: EF 60-65%. No rwma. Nl  RV fxn.   History of Papanicolaou smear of cervix 04/2011   normal per pt.   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   hgsil   Tobacco abuse    Type II diabetes mellitus (HCC)     Past Surgical History: Past Surgical History:  Procedure Laterality Date   COLPOSCOPY  09/28/2009   CORONARY STENT INTERVENTION N/A 05/04/2021   Procedure: CORONARY STENT INTERVENTION;  Surgeon: Darron Deatrice LABOR, MD;  Location: [facility] INVASIVE [facility];  Service: Cardiovascular;  Laterality: N/A;   GASTRIC BYPASS  04/2022   LEFT HEART CATH AND CORONARY ANGIOGRAPHY N/A 05/04/2021   Procedure: LEFT HEART CATH AND CORONARY ANGIOGRAPHY;  Surgeon: Darron Deatrice LABOR, MD;  Location: [facility] INVASIVE CV LAB;  Service: Cardiovascular;  Laterality: N/A;   TONSILLECTOMY AND ADENOIDECTOMY     TYMPANOSTOMY TUBE PLACEMENT Bilateral     Social History: Social History  Socioeconomic History   Marital status: Married    Spouse name: Not on file   Number of children: 3   Years of education: 27   Highest education level: Some college, no degree  Occupational History   Occupation: unemployed   Occupation: Dentist: [employer]  Tobacco Use   Smoking status: Former    Current packs/day: 0.00    Average packs/day: 1 pack/day for 10.0 years (10.0 ttl pk-yrs)    Types: Cigarettes    Start date: 05/04/2011    Quit date: 05/03/2021    Years since quitting: 3.2   Smokeless tobacco: Never  Vaping Use   Vaping status: Former   Quit date: 03/19/2022  Substance and Sexual Activity   Alcohol use: No   Drug use: No   Sexual activity: Yes    Partners: Male    Birth control/protection: Surgical, I.U.D.    Comment: Mirena /Vasectomy  Other Topics Concern   Not on file  Social History Narrative   Three kiddos   43, 65, 69    Oldest boy, two younger girls   Social Drivers of Corporate investment banker Strain: Low Risk  (06/27/2024)   Received from [facility]   Overall Financial  Resource Strain (CARDIA)    How hard is it for you to pay for the very basics like food, housing, medical care, and heating?: Not hard at all  Food Insecurity: No Food Insecurity (06/27/2024)   Received from [facility]   Hunger Vital Sign    Within the past 12 months, you worried that your food would run out before you got the money to buy more.: Never true    Within the past 12 months, the food you bought just didn't last and you didn't have money to get more.: Never true  Transportation Needs: No Transportation Needs (06/27/2024)   Received from [facility] - Transportation    In the past 12 months, has lack of transportation kept you from medical appointments or from getting medications?: No    In the past 12 months, has lack of transportation kept you from meetings, work, or from getting things needed for daily living?: No  Physical Activity: Sufficiently Active (06/27/2024)   Received from [facility]   Exercise Vital Sign    On average, how many days per week do you engage in moderate to strenuous exercise (like a brisk walk)?: 5 days    On average, how many minutes do you engage in exercise at this level?: 90 min  Stress: No Stress Concern Present (06/27/2024)   Received from [facility] - Occupational Stress Questionnaire    Do you feel stress - tense, restless, nervous, or anxious, or unable to sleep at night because your mind is troubled all the time - these days?: Not at all  Social Connections: Socially Integrated (06/27/2024)   Received from [facility]   Social Network    How would you rate your social network (family, work, friends)?: Good participation with social networks  Intimate Partner Violence: Not At Risk (06/27/2024)   Received from [facility]   HITS    Over the last 12 months how often did your partner physically hurt you?: Never    Over the last 12 months how often did your partner insult you or talk down to  you?: Never    Over the last 12 months how often did your partner threaten  you with physical harm?: Never    Over the last 12 months how often did your partner scream or curse at you?: Never    Family History: Family History  Problem Relation Age of Onset   Healthy Mother    Diabetes Father    Breast cancer Maternal Grandmother        late years   Heart attack Maternal Grandfather        78   Diabetes Paternal Grandfather     Review of Systems: Denies any recent fevers, chills or changes in her health  Physical Exam: Vital Signs BP (!) 111/59   Pulse 65   Ht 5' 5 (1.651 m)   Wt 178 lb (80.7 kg)   SpO2 100%   BMI 29.62 kg/m   Physical Exam  Constitutional:      General: Not in acute distress.    Appearance: Normal appearance. Not ill-appearing.  HENT:     Head: Normocephalic and atraumatic.  Eyes:     Pupils: Pupils are equal, round Neck:     Musculoskeletal: Normal range of motion.  Cardiovascular:     Rate and Rhythm: Normal rate Pulmonary:     Effort: Pulmonary effort is normal. No respiratory distress.  Abdominal:  Musculoskeletal: Normal range of motion.  Skin:    General: Skin is warm and dry.     Findings: No erythema or rash.  Neurological:     Mental Status: Alert and oriented to person, place, and time. Mental status is at baseline.  Psychiatric:        Mood and Affect: Mood normal.        Behavior: Behavior normal.    Assessment/Plan: The patient is scheduled for panniculectomy with Dr. Montorfano.  Risks, benefits, and alternatives of procedure discussed, questions answered and consent obtained.    Smoking Status: Quit about 2 months ago; Counseling Given?  Discussed with her the importance of smoking cessation.  Discussed with her that smoking can greatly inhibit wound healing.  She expressed understanding.  Caprini Score: 4; Risk Factors include: BMI greater than 25, history of MI and length of planned surgery. Recommendation for mechanical  prophylaxis. Encourage early ambulation.   Pictures obtained: @consult   Post-op Rx sent to pharmacy: Gabapentin, oxycodone , Zofran , Tylenol   Discussed with the patient to continue with her aspirin  as recommended by cardiology.  Did speak to Dr. Montorfano about this.  I discussed with the patient that given that she will be on aspirin , she is at a little bit of a higher risk of bleeding postoperatively.  Patient expressed understanding.  Instructed patient to hold any multivitamins or supplements at least 1 week prior to surgery.  She expressed understanding.  Patient was provided with the General Surgical Risk consent document and Pain Medication Agreement prior to their appointment.  They had adequate time to read through the risk consent documents and Pain Medication Agreement. We also discussed them in person together during this preop appointment. All of their questions were answered to their satisfaction.  Recommended calling if they have any further questions.  Risk consent form and Pain Medication Agreement to be scanned into patient's chart.  The consent was obtained with risks and complications reviewed which included bleeding, pain, scar, infection and the risk of anesthesia.  The patients questions were answered to the patients expressed satisfaction.   We did discuss the difference between abdominoplasty and panniculectomy.  I discussed with her that excess skin above the umbilicus most likely will not be changed  from panniculectomy surgery.  I discussed with the patient that there is a chance her bellybutton may be slightly lowered.  She expressed understanding to this.   Electronically signed by: Estefana FORBES Peck, PA-C 08/12/2024 4:58 PM

## 2024-08-12 NOTE — H&P (View-Only) (Signed)
 Patient ID: Holly Garner, female    DOB: 29-Apr-1981, 43 y.o.   MRN: [id number]  Preoperative appointment     ICD-10-CM   1. Panniculus  E65        History of Present Illness: Holly Garner is a 43 y.o.  female  with a history of panniculitis.  She presents for preoperative evaluation for upcoming procedure, panniculectomy, scheduled for 08/17/2024 with Dr. Montorfano.  The patient has not had problems with anesthesia.  Patient does report heart attack back in July 2022.  She denies any issues since then.  She does state that she takes aspirin , she denies taking any other blood thinners.  She reports that she stopped smoking after her consult with Dr. Montorfano.   Patient reports she currently has an IUD in place.  She denies any history of more than 3 miscarriages.  She denies any personal or family history of blood clots or clotting diseases.  She denies any recent surgeries, traumas or infections.  She denies any history of stroke.  She denies any history of Crohn's disease or ulcerative colitis, COPD or asthma.  She denies any history of cancer.  She denies any varicosities to her lower extremities.  She denies any recent fevers, chills or changes in her health.  Summary of Previous Visit: Patient was seen for initial consult by Dr. Montorfano on 06/17/2024.  At this visit, it was noted that patient underwent laparoscopic RYGB in 2013.  Since then, patient had almost lost 195 pounds.  Patient noted excess skin involving her abdomen and complained of rashes in between the skin folds.  It was reported that patient does vape.  It was also noted that patient had history of CAD and NSTEMI for which patient would need cardiology clearance if she were to proceed with surgery.  It was noted that patient would be a good candidate for panniculectomy, possible fleur-de-lis abdominoplasty and thighplasty.  Thighs would be addressed at a later date.    Per chart review, patient saw cardiology  on 08/03/2024.  Per cardiology: Preprocedure cardiac risk stratification: She is currently scheduled to undergo panniculectomy on 08/17/2024 following significant weight loss with gastric bypass surgery.  Per [facility] Activity Status Index, she can achieve > 4 METs.  Per Revised Cardiac Risk Index, she is low risk for noncardiac surgery with an estimated rate of 0.9% for adverse cardiac event in the periprocedural timeframe.  She is without symptoms of angina or cardiac decompensation.  She may proceed with noncardiac surgery at an overall low risk without further cardiac testing.  Recommend she continue aspirin  81 mg daily throughout the periprocedural timeframe unless the bleeding risk is deemed to be too great.  I spoke with Dr. Montorfano about cardiology's recommendation for patient to continue aspirin .  He states that he is okay with patient remaining on her aspirin  throughout the perioperative period  Per chart review, telephone note on 07/14/2024 states that clinical staff spoke with Dr. Montorfano and he was fine with patient not having PCP clearance, but would need cardiology clearance.  Job: [employer], planning to take 2 weeks off.  She understands that she will have restrictions for 6 weeks.  All  PMH Significant for: Hypertension, type 2 diabetes, NSTEMI, anemia, history of gastric bypass, panniculitis  Patient had nicotine  test completed on 07/28/2024 which was negative  Patient also had CBC completed on 03/30/2024 which showed hemoglobin of 12.7, she also had A1c completed at the same time which was  5.6.   Past Medical History: Allergies: Allergies  Allergen Reactions   Metoprolol  Hives   Omeprazole Hives   Statins Hives, Itching and Other (See Comments)    Other reaction(s): Unknown    Atorvastatin  Hives and Itching   Rosuvastatin  Hives and Itching   Contrast Media [Iodinated Contrast Media] Itching   Repatha  [Evolocumab ] Hives    Current Medications:  Current  Outpatient Medications:    acetaminophen  (TYLENOL ) 500 MG tablet, Take 1 tablet (500 mg total) by mouth every 6 (six) hours as needed for up to 30 doses for mild pain (pain score 1-3) or moderate pain (pain score 4-6)., Disp: 30 tablet, Rfl: 0   gabapentin (NEURONTIN) 300 MG capsule, Take 1 capsule (300 mg total) by mouth 2 (two) times daily for 14 doses., Disp: 14 capsule, Rfl: 0   ondansetron  (ZOFRAN ) 4 MG tablet, Take 1 tablet (4 mg total) by mouth every 8 (eight) hours as needed for up to 20 doses for nausea or vomiting., Disp: 20 tablet, Rfl: 0   oxyCODONE  (ROXICODONE ) 5 MG immediate release tablet, Take 1 tablet (5 mg total) by mouth every 6 (six) hours as needed for up to 15 doses for severe pain (pain score 7-10)., Disp: 15 tablet, Rfl: 0   aspirin  81 MG chewable tablet, Chew 1 tablet (81 mg total) by mouth daily., Disp: 30 tablet, Rfl: 0   Calcium  Carb-Cholecalciferol (CALCIUM  + VITAMIN D3) 500-5 MG-MCG TABS, Take by mouth., Disp: , Rfl:    cyclobenzaprine (FLEXERIL) 5 MG tablet, Take 1 tablet (5 mg total) by mouth 3 (three) times daily as needed. (Patient not taking: Reported on 08/10/2024), Disp: 15 tablet, Rfl: 0   EPINEPHrine  0.3 mg/0.3 mL IJ SOAJ injection, Inject 0.3 mg into the muscle as needed for anaphylaxis., Disp: 1 each, Rfl: 0   ferrous sulfate  325 (65 FE) MG tablet, Take 325 mg by mouth daily with breakfast., Disp: , Rfl:    levonorgestrel  (MIRENA ) 20 MCG/DAY IUD, 1 each by Intrauterine route once., Disp: , Rfl:    Multiple Vitamin (MULTIVITAMIN) tablet, Take 1 tablet by mouth daily., Disp: , Rfl:   Past Medical Problems: Past Medical History:  Diagnosis Date   Anxiety    BMI 50.0-59.9, adult (HCC)    CAD (coronary artery disease)    a. 04/2021 NSTEMI/PCI: LM nl, LAD 59m, 30d, D1 nl, LCX 31m, OM1 min irregs, RCA 20p, 30p/m, 100d (2.5x26 Resolute Onyx DES).   Essential hypertension    High cholesterol    History of echocardiogram    a. 04/2021 Echo: EF 60-65%. No rwma. Nl  RV fxn.   History of Papanicolaou smear of cervix 04/2011   normal per pt.   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   hgsil   Tobacco abuse    Type II diabetes mellitus (HCC)     Past Surgical History: Past Surgical History:  Procedure Laterality Date   COLPOSCOPY  09/28/2009   CORONARY STENT INTERVENTION N/A 05/04/2021   Procedure: CORONARY STENT INTERVENTION;  Surgeon: Darron Deatrice LABOR, MD;  Location: [facility] INVASIVE CV LAB;  Service: Cardiovascular;  Laterality: N/A;   GASTRIC BYPASS  04/2022   LEFT HEART CATH AND CORONARY ANGIOGRAPHY N/A 05/04/2021   Procedure: LEFT HEART CATH AND CORONARY ANGIOGRAPHY;  Surgeon: Darron Deatrice LABOR, MD;  Location: [facility] INVASIVE CV LAB;  Service: Cardiovascular;  Laterality: N/A;   TONSILLECTOMY AND ADENOIDECTOMY     TYMPANOSTOMY TUBE PLACEMENT Bilateral     Social History: Social History  Socioeconomic History   Marital status: Married    Spouse name: Not on file   Number of children: 3   Years of education: 27   Highest education level: Some college, no degree  Occupational History   Occupation: unemployed   Occupation: Dentist: [employer]  Tobacco Use   Smoking status: Former    Current packs/day: 0.00    Average packs/day: 1 pack/day for 10.0 years (10.0 ttl pk-yrs)    Types: Cigarettes    Start date: 05/04/2011    Quit date: 05/03/2021    Years since quitting: 3.2   Smokeless tobacco: Never  Vaping Use   Vaping status: Former   Quit date: 03/19/2022  Substance and Sexual Activity   Alcohol use: No   Drug use: No   Sexual activity: Yes    Partners: Male    Birth control/protection: Surgical, I.U.D.    Comment: Mirena /Vasectomy  Other Topics Concern   Not on file  Social History Narrative   Three kiddos   43, 65, 69    Oldest boy, two younger girls   Social Drivers of Corporate investment banker Strain: Low Risk  (06/27/2024)   Received from [facility]   Overall Financial  Resource Strain (CARDIA)    How hard is it for you to pay for the very basics like food, housing, medical care, and heating?: Not hard at all  Food Insecurity: No Food Insecurity (06/27/2024)   Received from [facility]   Hunger Vital Sign    Within the past 12 months, you worried that your food would run out before you got the money to buy more.: Never true    Within the past 12 months, the food you bought just didn't last and you didn't have money to get more.: Never true  Transportation Needs: No Transportation Needs (06/27/2024)   Received from [facility] - Transportation    In the past 12 months, has lack of transportation kept you from medical appointments or from getting medications?: No    In the past 12 months, has lack of transportation kept you from meetings, work, or from getting things needed for daily living?: No  Physical Activity: Sufficiently Active (06/27/2024)   Received from [facility]   Exercise Vital Sign    On average, how many days per week do you engage in moderate to strenuous exercise (like a brisk walk)?: 5 days    On average, how many minutes do you engage in exercise at this level?: 90 min  Stress: No Stress Concern Present (06/27/2024)   Received from [facility] - Occupational Stress Questionnaire    Do you feel stress - tense, restless, nervous, or anxious, or unable to sleep at night because your mind is troubled all the time - these days?: Not at all  Social Connections: Socially Integrated (06/27/2024)   Received from [facility]   Social Network    How would you rate your social network (family, work, friends)?: Good participation with social networks  Intimate Partner Violence: Not At Risk (06/27/2024)   Received from [facility]   HITS    Over the last 12 months how often did your partner physically hurt you?: Never    Over the last 12 months how often did your partner insult you or talk down to  you?: Never    Over the last 12 months how often did your partner threaten  you with physical harm?: Never    Over the last 12 months how often did your partner scream or curse at you?: Never    Family History: Family History  Problem Relation Age of Onset   Healthy Mother    Diabetes Father    Breast cancer Maternal Grandmother        late years   Heart attack Maternal Grandfather        78   Diabetes Paternal Grandfather     Review of Systems: Denies any recent fevers, chills or changes in her health  Physical Exam: Vital Signs BP (!) 111/59   Pulse 65   Ht 5' 5 (1.651 m)   Wt 178 lb (80.7 kg)   SpO2 100%   BMI 29.62 kg/m   Physical Exam  Constitutional:      General: Not in acute distress.    Appearance: Normal appearance. Not ill-appearing.  HENT:     Head: Normocephalic and atraumatic.  Eyes:     Pupils: Pupils are equal, round Neck:     Musculoskeletal: Normal range of motion.  Cardiovascular:     Rate and Rhythm: Normal rate Pulmonary:     Effort: Pulmonary effort is normal. No respiratory distress.  Abdominal:  Musculoskeletal: Normal range of motion.  Skin:    General: Skin is warm and dry.     Findings: No erythema or rash.  Neurological:     Mental Status: Alert and oriented to person, place, and time. Mental status is at baseline.  Psychiatric:        Mood and Affect: Mood normal.        Behavior: Behavior normal.    Assessment/Plan: The patient is scheduled for panniculectomy with Dr. Montorfano.  Risks, benefits, and alternatives of procedure discussed, questions answered and consent obtained.    Smoking Status: Quit about 2 months ago; Counseling Given?  Discussed with her the importance of smoking cessation.  Discussed with her that smoking can greatly inhibit wound healing.  She expressed understanding.  Caprini Score: 4; Risk Factors include: BMI greater than 25, history of MI and length of planned surgery. Recommendation for mechanical  prophylaxis. Encourage early ambulation.   Pictures obtained: @consult   Post-op Rx sent to pharmacy: Gabapentin, oxycodone , Zofran , Tylenol   Discussed with the patient to continue with her aspirin  as recommended by cardiology.  Did speak to Dr. Montorfano about this.  I discussed with the patient that given that she will be on aspirin , she is at a little bit of a higher risk of bleeding postoperatively.  Patient expressed understanding.  Instructed patient to hold any multivitamins or supplements at least 1 week prior to surgery.  She expressed understanding.  Patient was provided with the General Surgical Risk consent document and Pain Medication Agreement prior to their appointment.  They had adequate time to read through the risk consent documents and Pain Medication Agreement. We also discussed them in person together during this preop appointment. All of their questions were answered to their satisfaction.  Recommended calling if they have any further questions.  Risk consent form and Pain Medication Agreement to be scanned into patient's chart.  The consent was obtained with risks and complications reviewed which included bleeding, pain, scar, infection and the risk of anesthesia.  The patients questions were answered to the patients expressed satisfaction.   We did discuss the difference between abdominoplasty and panniculectomy.  I discussed with her that excess skin above the umbilicus most likely will not be changed  from panniculectomy surgery.  I discussed with the patient that there is a chance her bellybutton may be slightly lowered.  She expressed understanding to this.   Electronically signed by: Estefana FORBES Peck, PA-C 08/12/2024 4:58 PM

## 2024-08-14 NOTE — Anesthesia Preprocedure Evaluation (Addendum)
 Anesthesia Evaluation  Patient identified by MRN, date of birth, ID band Patient awake    Reviewed: Allergy & Precautions, NPO status , Patient's Chart, lab work & pertinent test results  Airway Mallampati: III  TM Distance: >3 FB Neck ROM: Full    Dental  (+) Teeth Intact, Dental Advisory Given   Pulmonary Patient abstained from smoking., former smoker Former smoker, quit 2022, 10 pack year history    Pulmonary exam normal breath sounds clear to auscultation       Cardiovascular hypertension, + CAD, + Past MI and + Cardiac Stents (2022)  Normal cardiovascular exam Rhythm:Regular Rate:Normal  Echo 04/2021  1. Left ventricular ejection fraction, by estimation, is 60 to 65%. The  left ventricle has normal function. The left ventricle has no regional  wall motion abnormalities. Left ventricular diastolic parameters were  normal.   2. Right ventricular systolic function is normal. The right ventricular  size is normal.   3. The mitral valve is normal in structure. No evidence of mitral valve  regurgitation. No evidence of mitral stenosis.      Neuro/Psych  PSYCHIATRIC DISORDERS Anxiety     negative neurological ROS     GI/Hepatic Neg liver ROS,,,S/p gastric bypass 2023- no GERD   Endo/Other  diabetes    Renal/GU negative Renal ROS  negative genitourinary   Musculoskeletal negative musculoskeletal ROS (+)    Abdominal   Peds  Hematology negative hematology ROS (+)   Anesthesia Other Findings   Reproductive/Obstetrics negative OB ROS                              Anesthesia Physical Anesthesia Plan  ASA: 2  Anesthesia Plan: General   Post-op Pain Management: Tylenol  PO (pre-op)*, Toradol  IV (intra-op)* and Ketamine IV*   Induction: Intravenous  PONV Risk Score and Plan: 3 and Ondansetron , Dexamethasone , Midazolam  and Treatment may vary due to age or medical condition  Airway  Management Planned: Oral ETT  Additional Equipment: None  Intra-op Plan:   Post-operative Plan: Extubation in OR  Informed Consent: I have reviewed the patients History and Physical, chart, labs and discussed the procedure including the risks, benefits and alternatives for the proposed anesthesia with the patient or authorized representative who has indicated his/her understanding and acceptance.     Dental advisory given  Plan Discussed with: CRNA  Anesthesia Plan Comments:          Anesthesia Quick Evaluation

## 2024-08-17 ENCOUNTER — Ambulatory Visit (HOSPITAL_BASED_OUTPATIENT_CLINIC_OR_DEPARTMENT_OTHER): Payer: Self-pay | Admitting: Anesthesiology

## 2024-08-17 ENCOUNTER — Other Ambulatory Visit: Payer: Self-pay

## 2024-08-17 ENCOUNTER — Encounter (HOSPITAL_BASED_OUTPATIENT_CLINIC_OR_DEPARTMENT_OTHER): Payer: Self-pay

## 2024-08-17 ENCOUNTER — Ambulatory Visit (HOSPITAL_BASED_OUTPATIENT_CLINIC_OR_DEPARTMENT_OTHER): Admission: RE | Admit: 2024-08-17 | Discharge: 2024-08-17 | Disposition: A | Discharge status: Home / Self Care

## 2024-08-17 ENCOUNTER — Encounter (HOSPITAL_BASED_OUTPATIENT_CLINIC_OR_DEPARTMENT_OTHER): Admission: RE | Disposition: A | Discharge status: Home / Self Care | Payer: Self-pay | Source: Home / Self Care

## 2024-08-17 DIAGNOSIS — I10 Essential (primary) hypertension: Secondary | ICD-10-CM | POA: Insufficient documentation

## 2024-08-17 DIAGNOSIS — E65 Localized adiposity: Secondary | ICD-10-CM | POA: Insufficient documentation

## 2024-08-17 DIAGNOSIS — E119 Type 2 diabetes mellitus without complications: Secondary | ICD-10-CM | POA: Diagnosis not present

## 2024-08-17 DIAGNOSIS — E7800 Pure hypercholesterolemia, unspecified: Secondary | ICD-10-CM | POA: Diagnosis not present

## 2024-08-17 DIAGNOSIS — Z560 Unemployment, unspecified: Secondary | ICD-10-CM | POA: Insufficient documentation

## 2024-08-17 DIAGNOSIS — Z01818 Encounter for other preprocedural examination: Secondary | ICD-10-CM

## 2024-08-17 DIAGNOSIS — Z79899 Other long term (current) drug therapy: Secondary | ICD-10-CM | POA: Diagnosis not present

## 2024-08-17 DIAGNOSIS — Z9884 Bariatric surgery status: Secondary | ICD-10-CM | POA: Diagnosis not present

## 2024-08-17 DIAGNOSIS — I2510 Atherosclerotic heart disease of native coronary artery without angina pectoris: Secondary | ICD-10-CM | POA: Insufficient documentation

## 2024-08-17 DIAGNOSIS — Z87891 Personal history of nicotine dependence: Secondary | ICD-10-CM | POA: Diagnosis not present

## 2024-08-17 DIAGNOSIS — Z7982 Long term (current) use of aspirin: Secondary | ICD-10-CM | POA: Diagnosis not present

## 2024-08-17 DIAGNOSIS — M793 Panniculitis, unspecified: Secondary | ICD-10-CM

## 2024-08-17 HISTORY — DX: Anxiety disorder, unspecified: F41.9

## 2024-08-17 HISTORY — PX: PANNICULECTOMY: SHX5360

## 2024-08-17 LAB — POCT PREGNANCY, URINE: Preg Test, Ur: NEGATIVE

## 2024-08-17 SURGERY — PANNICULECTOMY
Anesthesia: General | Site: Abdomen | Laterality: Bilateral

## 2024-08-17 MED ORDER — HYDROMORPHONE HCL 1 MG/ML IJ SOLN
INTRAMUSCULAR | Status: AC
  Filled 2024-08-17: qty 0.5

## 2024-08-17 MED ORDER — AMISULPRIDE (ANTIEMETIC) 5 MG/2ML IV SOLN: 10.0000 mg | Freq: Once | INTRAVENOUS | Status: DC | PRN

## 2024-08-17 MED ORDER — SCOPOLAMINE 1 MG/3DAYS TD PT72
1.0000 | MEDICATED_PATCH | TRANSDERMAL | Status: DC
  Administered 2024-08-17: 1 mg via TRANSDERMAL

## 2024-08-17 MED ORDER — BUPIVACAINE LIPOSOME 1.3 % IJ SUSP
INTRAMUSCULAR | Status: AC
  Filled 2024-08-17: qty 20

## 2024-08-17 MED ORDER — BACITRACIN ZINC 500 UNIT/GM EX OINT
TOPICAL_OINTMENT | CUTANEOUS | Status: DC | PRN
  Administered 2024-08-17: 1 via TOPICAL

## 2024-08-17 MED ORDER — GABAPENTIN 300 MG PO CAPS
300.0000 mg | ORAL_CAPSULE | ORAL | Status: AC
  Administered 2024-08-17: 300 mg via ORAL

## 2024-08-17 MED ORDER — PROPOFOL 10 MG/ML IV BOLUS
INTRAVENOUS | Status: DC | PRN
  Administered 2024-08-17: 200 mg via INTRAVENOUS

## 2024-08-17 MED ORDER — KETAMINE HCL 10 MG/ML IJ SOLN
INTRAMUSCULAR | Status: DC | PRN
  Administered 2024-08-17 (×2): 10 mg via INTRAVENOUS
  Administered 2024-08-17: 30 mg via INTRAVENOUS

## 2024-08-17 MED ORDER — SUGAMMADEX SODIUM 200 MG/2ML IV SOLN
INTRAVENOUS | Status: DC | PRN
  Administered 2024-08-17: 200 mg via INTRAVENOUS

## 2024-08-17 MED ORDER — CEFAZOLIN SODIUM-DEXTROSE 2-4 GM/100ML-% IV SOLN
INTRAVENOUS | Status: AC
  Filled 2024-08-17: qty 100

## 2024-08-17 MED ORDER — MEPERIDINE HCL 25 MG/ML IJ SOLN: 6.2500 mg | INTRAMUSCULAR | Status: DC | PRN

## 2024-08-17 MED ORDER — OXYCODONE HCL 5 MG PO TABS
5.0000 mg | ORAL_TABLET | Freq: Once | ORAL | Status: AC | PRN
  Administered 2024-08-17: 5 mg via ORAL

## 2024-08-17 MED ORDER — VASHE WOUND IRRIGATION OPTIME
TOPICAL | Status: DC | PRN
  Administered 2024-08-17: 34 [oz_av]

## 2024-08-17 MED ORDER — OXYCODONE HCL 5 MG/5ML PO SOLN: 5.0000 mg | Freq: Once | ORAL | Status: AC | PRN

## 2024-08-17 MED ORDER — FENTANYL CITRATE (PF) 100 MCG/2ML IJ SOLN
INTRAMUSCULAR | Status: AC
  Filled 2024-08-17: qty 2

## 2024-08-17 MED ORDER — NITROGLYCERIN 2 % TD OINT
TOPICAL_OINTMENT | TRANSDERMAL | Status: AC
  Filled 2024-08-17: qty 30

## 2024-08-17 MED ORDER — MIDAZOLAM HCL 2 MG/2ML IJ SOLN
INTRAMUSCULAR | Status: AC
  Filled 2024-08-17: qty 2

## 2024-08-17 MED ORDER — SCOPOLAMINE 1 MG/3DAYS TD PT72
MEDICATED_PATCH | TRANSDERMAL | Status: AC
  Filled 2024-08-17: qty 1

## 2024-08-17 MED ORDER — CHLORHEXIDINE GLUCONATE CLOTH 2 % EX PADS: 6.0000 | MEDICATED_PAD | Freq: Once | CUTANEOUS | Status: DC

## 2024-08-17 MED ORDER — KETAMINE HCL 50 MG/5ML IJ SOSY
PREFILLED_SYRINGE | INTRAMUSCULAR | Status: AC
  Filled 2024-08-17: qty 5

## 2024-08-17 MED ORDER — HYDROMORPHONE HCL 1 MG/ML IJ SOLN
0.2500 mg | INTRAMUSCULAR | Status: DC | PRN
  Administered 2024-08-17 (×2): 0.5 mg via INTRAVENOUS

## 2024-08-17 MED ORDER — ACETAMINOPHEN 500 MG PO TABS
ORAL_TABLET | ORAL | Status: AC
  Filled 2024-08-17: qty 2

## 2024-08-17 MED ORDER — LIDOCAINE 2% (20 MG/ML) 5 ML SYRINGE
INTRAMUSCULAR | Status: AC
  Filled 2024-08-17: qty 5

## 2024-08-17 MED ORDER — ONDANSETRON HCL 4 MG/2ML IJ SOLN
INTRAMUSCULAR | Status: DC | PRN
Start: 2024-08-17 — End: 2024-08-17
  Administered 2024-08-17: 4 mg via INTRAVENOUS

## 2024-08-17 MED ORDER — HYDROMORPHONE HCL 1 MG/ML IJ SOLN
INTRAMUSCULAR | Status: DC | PRN
  Administered 2024-08-17: .5 mg via INTRAVENOUS

## 2024-08-17 MED ORDER — ROCURONIUM BROMIDE 10 MG/ML (PF) SYRINGE
PREFILLED_SYRINGE | INTRAVENOUS | Status: DC | PRN
  Administered 2024-08-17: 10 mg via INTRAVENOUS
  Administered 2024-08-17: 60 mg via INTRAVENOUS
  Administered 2024-08-17: 10 mg via INTRAVENOUS

## 2024-08-17 MED ORDER — ACETAMINOPHEN 500 MG PO TABS
1000.0000 mg | ORAL_TABLET | Freq: Once | ORAL | Status: AC
  Administered 2024-08-17: 1000 mg via ORAL

## 2024-08-17 MED ORDER — ENOXAPARIN SODIUM 40 MG/0.4ML IJ SOSY
40.0000 mg | PREFILLED_SYRINGE | Freq: Once | INTRAMUSCULAR | Status: AC
  Administered 2024-08-17: 40 mg via SUBCUTANEOUS

## 2024-08-17 MED ORDER — MIDAZOLAM HCL (PF) 2 MG/2ML IJ SOLN
INTRAMUSCULAR | Status: DC | PRN
  Administered 2024-08-17: 2 mg via INTRAVENOUS

## 2024-08-17 MED ORDER — PROPOFOL 500 MG/50ML IV EMUL
INTRAVENOUS | Status: AC
  Filled 2024-08-17: qty 50

## 2024-08-17 MED ORDER — DEXAMETHASONE SOD PHOSPHATE PF 10 MG/ML IJ SOLN
INTRAMUSCULAR | Status: DC | PRN
  Administered 2024-08-17: 10 mg via INTRAVENOUS

## 2024-08-17 MED ORDER — SODIUM CHLORIDE (PF) 0.9 % IJ SOLN
INTRAMUSCULAR | Status: AC
  Filled 2024-08-17: qty 100

## 2024-08-17 MED ORDER — SCOPOLAMINE 1 MG/3DAYS TD PT72: 1.0000 | MEDICATED_PATCH | TRANSDERMAL | Status: DC

## 2024-08-17 MED ORDER — ACETAMINOPHEN 500 MG PO TABS: 1000.0000 mg | ORAL_TABLET | ORAL | Status: AC

## 2024-08-17 MED ORDER — BUPIVACAINE HCL (PF) 0.25 % IJ SOLN
INTRAMUSCULAR | Status: AC
  Filled 2024-08-17: qty 60

## 2024-08-17 MED ORDER — NITROGLYCERIN 2 % TD OINT
TOPICAL_OINTMENT | TRANSDERMAL | Status: DC | PRN
  Administered 2024-08-17: 1 [in_us] via TOPICAL

## 2024-08-17 MED ORDER — BACITRACIN ZINC 500 UNIT/GM EX OINT
TOPICAL_OINTMENT | CUTANEOUS | Status: AC
  Filled 2024-08-17: qty 28.35

## 2024-08-17 MED ORDER — GABAPENTIN 300 MG PO CAPS
ORAL_CAPSULE | ORAL | Status: AC
  Filled 2024-08-17: qty 1

## 2024-08-17 MED ORDER — OXYCODONE HCL 5 MG PO TABS
ORAL_TABLET | ORAL | Status: AC
  Filled 2024-08-17: qty 1

## 2024-08-17 MED ORDER — ENOXAPARIN SODIUM 40 MG/0.4ML IJ SOSY
PREFILLED_SYRINGE | INTRAMUSCULAR | Status: AC
  Filled 2024-08-17: qty 0.4

## 2024-08-17 MED ORDER — CEFAZOLIN SODIUM-DEXTROSE 2-4 GM/100ML-% IV SOLN
2.0000 g | INTRAVENOUS | Status: AC
  Administered 2024-08-17: 2 g via INTRAVENOUS

## 2024-08-17 MED ORDER — ONDANSETRON HCL 4 MG/2ML IJ SOLN: 4.0000 mg | Freq: Once | INTRAMUSCULAR | Status: DC | PRN

## 2024-08-17 MED ORDER — EPHEDRINE 5 MG/ML INJ
INTRAVENOUS | Status: AC
  Filled 2024-08-17: qty 5

## 2024-08-17 MED ORDER — 0.9 % SODIUM CHLORIDE (POUR BTL) OPTIME
TOPICAL | Status: DC | PRN
Start: 2024-08-17 — End: 2024-08-17
  Administered 2024-08-17 (×3): 1000 mL

## 2024-08-17 MED ORDER — SODIUM CHLORIDE (PF) 0.9 % IJ SOLN
INTRAMUSCULAR | Status: DC | PRN
  Administered 2024-08-17: 20 mL

## 2024-08-17 MED ORDER — ROCURONIUM BROMIDE 10 MG/ML (PF) SYRINGE
PREFILLED_SYRINGE | INTRAVENOUS | Status: AC
  Filled 2024-08-17: qty 10

## 2024-08-17 MED ORDER — FENTANYL CITRATE (PF) 100 MCG/2ML IJ SOLN
INTRAMUSCULAR | Status: DC | PRN
  Administered 2024-08-17 (×4): 50 ug via INTRAVENOUS

## 2024-08-17 MED ORDER — KETOROLAC TROMETHAMINE 30 MG/ML IJ SOLN: 30.0000 mg | Freq: Once | INTRAMUSCULAR | Status: DC | PRN

## 2024-08-17 MED ORDER — LIDOCAINE 2% (20 MG/ML) 5 ML SYRINGE
INTRAMUSCULAR | Status: DC | PRN
  Administered 2024-08-17: 60 mg via INTRAVENOUS

## 2024-08-17 MED ORDER — LACTATED RINGERS IV SOLN: INTRAVENOUS | Status: DC

## 2024-08-17 MED ORDER — EPHEDRINE SULFATE-NACL 50-0.9 MG/10ML-% IV SOSY
PREFILLED_SYRINGE | INTRAVENOUS | Status: DC | PRN
  Administered 2024-08-17: 5 mg via INTRAVENOUS
  Administered 2024-08-17: 10 mg via INTRAVENOUS

## 2024-08-17 MED ORDER — BUPIVACAINE LIPOSOME 1.3 % IJ SUSP: 20.0000 mL | Freq: Once | INTRAMUSCULAR | Status: DC

## 2024-08-17 SURGICAL SUPPLY — 56 items
BINDER ABDOMINAL 12 ML 46-62 (SOFTGOODS) IMPLANT
BIOPATCH RED 1 DISK 7.0 (GAUZE/BANDAGES/DRESSINGS) ×4 IMPLANT
BLADE SURG 10 STRL SS (BLADE) ×8 IMPLANT
BLADE SURG 11 STRL SS (BLADE) IMPLANT
BLADE SURG 15 STRL LF DISP TIS (BLADE) ×2 IMPLANT
BNDG EYE OVAL 2 1/8 X 2 5/8 (GAUZE/BANDAGES/DRESSINGS) IMPLANT
CHLORAPREP W/TINT 26 (MISCELLANEOUS) IMPLANT
CLEANSER WND VASHE 34 (WOUND CARE) IMPLANT
CLIP APPLIE 9.375 MED OPEN (MISCELLANEOUS) IMPLANT
DERMABOND ADVANCED .7 DNX12 (GAUZE/BANDAGES/DRESSINGS) ×4 IMPLANT
DRAIN CHANNEL 15F RND FF W/TCR (WOUND CARE) IMPLANT
DRAPE UTILITY XL STRL (DRAPES) ×2 IMPLANT
DRSG IV CHG TEGADERM 3.5X4.5 (GAUZE/BANDAGES/DRESSINGS) IMPLANT
DRSG TEGADERM 4X4.75 (GAUZE/BANDAGES/DRESSINGS) ×4 IMPLANT
ELECT COATED BLADE 2.86 ST (ELECTRODE) IMPLANT
ELECTRODE BLDE 4.0 EZ CLN MEGD (MISCELLANEOUS) ×2 IMPLANT
ELECTRODE REM PT RTRN 9FT ADLT (ELECTROSURGICAL) ×4 IMPLANT
EVACUATOR SILICONE 100CC (DRAIN) ×4 IMPLANT
GAUZE PAD ABD 8X10 STRL (GAUZE/BANDAGES/DRESSINGS) ×4 IMPLANT
GAUZE SPONGE 2X2 STRL 8-PLY (GAUZE/BANDAGES/DRESSINGS) ×4 IMPLANT
GAUZE XEROFORM 1X8 LF (GAUZE/BANDAGES/DRESSINGS) IMPLANT
GLOVE BIO SURGEON STRL SZ7.5 (GLOVE) IMPLANT
GLOVE BIO SURGEON STRL SZ8 (GLOVE) ×2 IMPLANT
GLOVE BIOGEL PI IND STRL 8 (GLOVE) ×2 IMPLANT
GLOVE SURG SYN 7.0 PF PI (GLOVE) IMPLANT
GOWN STRL REUS W/ TWL LRG LVL3 (GOWN DISPOSABLE) ×2 IMPLANT
GOWN STRL REUS W/ TWL XL LVL3 (GOWN DISPOSABLE) IMPLANT
GOWN STRL REUS W/TWL XL LVL3 (GOWN DISPOSABLE) ×2 IMPLANT
HEMOSTAT ARISTA ABSORB 3G PWDR (HEMOSTASIS) IMPLANT
HYDROGEN PEROXIDE 16OZ (MISCELLANEOUS) IMPLANT
MANIFOLD NEPTUNE II (INSTRUMENTS) ×2 IMPLANT
MARKER SKIN DUAL TIP RULER LAB (MISCELLANEOUS) IMPLANT
NDL HYPO 22X1.5 SAFETY MO (MISCELLANEOUS) ×4 IMPLANT
NEEDLE HYPO 22X1.5 SAFETY MO (MISCELLANEOUS) ×2 IMPLANT
NS IRRIG 1000ML POUR BTL (IV SOLUTION) ×2 IMPLANT
PACK BASIN DAY SURGERY FS (CUSTOM PROCEDURE TRAY) ×2 IMPLANT
PACK UNIVERSAL I (CUSTOM PROCEDURE TRAY) ×2 IMPLANT
PENCIL SMOKE EVACUATOR (MISCELLANEOUS) ×4 IMPLANT
PIN SAFETY STERILE (MISCELLANEOUS) ×2 IMPLANT
SLEEVE SCD COMPRESS KNEE MED (STOCKING) ×2 IMPLANT
SPONGE T-LAP 18X18 ~~LOC~~+RFID (SPONGE) ×4 IMPLANT
STAPLER SKIN PROX WIDE 3.9 (STAPLE) ×2 IMPLANT
STRIP SUTURE WOUND CLOSURE 1/2 (MISCELLANEOUS) IMPLANT
SUT MNCRL AB 3-0 PS2 27 (SUTURE) ×8 IMPLANT
SUT MNCRL AB 4-0 PS2 18 (SUTURE) ×4 IMPLANT
SUT PDS AB 3-0 CT2 27 (SUTURE) IMPLANT
SUT PLAIN GUT FAST 5-0 (SUTURE) IMPLANT
SUT SILK 2 0 SH (SUTURE) ×4 IMPLANT
SUT VIC AB 0 CT1 27XCR 8 STRN (SUTURE) IMPLANT
SYR 20ML LL LF (SYRINGE) ×4 IMPLANT
SYR BULB IRRIG 60ML STRL (SYRINGE) IMPLANT
SYR CONTROL 10ML LL (SYRINGE) ×2 IMPLANT
TOWEL GREEN STERILE FF (TOWEL DISPOSABLE) ×4 IMPLANT
TUBE CONNECTING 20X1/4 (TUBING) ×2 IMPLANT
UNDERPAD 30X36 HEAVY ABSORB (UNDERPADS AND DIAPERS) ×4 IMPLANT
YANKAUER SUCT BULB TIP NO VENT (SUCTIONS) ×2 IMPLANT

## 2024-08-17 NOTE — Anesthesia Procedure Notes (Signed)
 Procedure Name: Intubation Date/Time: 08/17/2024 7:40 AM  Performed by: Delayne Olam BIRCH, CRNAPre-anesthesia Checklist: Patient identified, Emergency Drugs available, Suction available and Patient being monitored Patient Re-evaluated:Patient Re-evaluated prior to induction Oxygen Delivery Method: Circle system utilized Preoxygenation: Pre-oxygenation with 100% oxygen Induction Type: IV induction Ventilation: Mask ventilation without difficulty Laryngoscope Size: Mac and 3 Grade View: Grade I Tube type: Oral Tube size: 7.0 mm Number of attempts: 1 Airway Equipment and Method: Stylet and Oral airway Placement Confirmation: ETT inserted through vocal cords under direct vision, positive ETCO2 and breath sounds checked- equal and bilateral Secured at: 22 cm Tube secured with: Tape Dental Injury: Teeth and Oropharynx as per pre-operative assessment

## 2024-08-17 NOTE — Discharge Instructions (Addendum)
 Next dose of Tylenol  may be given at 12:40pm if needed.    Post Anesthesia Home Care Instructions  Activity: Get plenty of rest for the remainder of the day. A responsible individual must stay with you for 24 hours following the procedure.  For the next 24 hours, DO NOT: -Drive a car -Advertising copywriter -Drink alcoholic beverages -Take any medication unless instructed by your physician -Make any legal decisions or sign important papers.  Meals: Start with liquid foods such as gelatin or soup. Progress to regular foods as tolerated. Avoid greasy, spicy, heavy foods. If nausea and/or vomiting occur, drink only clear liquids until the nausea and/or vomiting subsides. Call your physician if vomiting continues.  Special Instructions/Symptoms: Your throat may feel dry or sore from the anesthesia or the breathing tube placed in your throat during surgery. If this causes discomfort, gargle with warm salt water. The discomfort should disappear within 24 hours.  If you had a scopolamine  patch placed behind your ear for the management of post- operative nausea and/or vomiting:  1. The medication in the patch is effective for 72 hours, after which it should be removed.  Wrap patch in a tissue and discard in the trash. Wash hands thoroughly with soap and water. 2. You may remove the patch earlier than 72 hours if you experience unpleasant side effects which may include dry mouth, dizziness or visual disturbances. 3. Avoid touching the patch. Wash your hands with soap and water after contact with the patch.     Information for Discharge Teaching: EXPAREL  (bupivacaine  liposome injectable suspension)   Pain relief is important to your recovery. The goal is to control your pain so you can move easier and return to your normal activities as soon as possible after your procedure. Your physician may use several types of medicines to manage pain, swelling, and more.  Your surgeon or anesthesiologist gave  you EXPAREL (bupivacaine ) to help control your pain after surgery.  EXPAREL  is a local anesthetic designed to release slowly over an extended period of time to provide pain relief by numbing the tissue around the surgical site. EXPAREL  is designed to release pain medication over time and can control pain for up to 72 hours. Depending on how you respond to EXPAREL , you may require less pain medication during your recovery. EXPAREL  can help reduce or eliminate the need for opioids during the first few days after surgery when pain relief is needed the most. EXPAREL  is not an opioid and is not addictive. It does not cause sleepiness or sedation.   Important! A teal colored band has been placed on your arm with the date, time and amount of EXPAREL  you have received. Please leave this armband in place for the full 96 hours following administration, and then you may remove the band. If you return to the hospital for any reason within 96 hours following the administration of EXPAREL , the armband provides important information that your health care providers to know, and alerts them that you have received this anesthetic.    Possible side effects of EXPAREL : Temporary loss of sensation or ability to move in the area where medication was injected. Nausea, vomiting, constipation Rarely, numbness and tingling in your mouth or lips, lightheadedness, or anxiety may occur. Call your doctor right away if you think you may be experiencing any of these sensations, or if you have other questions regarding possible side effects.  Follow all other discharge instructions given to you by your surgeon or nurse. Eat a healthy  diet and drink plenty of water or other fluids.   About my Jackson-Pratt Bulb Drain  What is a Jackson-Pratt bulb? A Jackson-Pratt is a soft, round device used to collect drainage. It is connected to a long, thin drainage catheter, which is held in place by one or two small stiches near your  surgical incision site. When the bulb is squeezed, it forms a vacuum, forcing the drainage to empty into the bulb.  Emptying the Jackson-Pratt bulb- To empty the bulb: 1. Release the plug on the top of the bulb. 2. Pour the bulb's contents into a measuring container which your nurse will provide. 3. Record the time emptied and amount of drainage. Empty the drain(s) as often as your     doctor or nurse recommends.  Date                  Time                    Amount (Drain 1)                 Amount (Drain 2)  _____________________________________________________________________  _____________________________________________________________________  _____________________________________________________________________  _____________________________________________________________________  _____________________________________________________________________  _____________________________________________________________________  _____________________________________________________________________  _____________________________________________________________________  Squeezing the Jackson-Pratt Bulb- To squeeze the bulb: 1. Make sure the plug at the top of the bulb is open. 2. Squeeze the bulb tightly in your fist. You will hear air squeezing from the bulb. 3. Replace the plug while the bulb is squeezed. 4. Use a safety pin to attach the bulb to your clothing. This will keep the catheter from     pulling at the bulb insertion site.  When to call your doctor- Call your doctor if: Drain site becomes red, swollen or hot. You have a fever greater than 101 degrees F. There is oozing at the drain site. Drain falls out (apply a guaze bandage over the drain hole and secure it with tape). Drainage increases daily not related to activity patterns. (You will usually have more drainage when you are active than when you are resting.) Drainage has a bad odor.     Activity (include date  of return to work if known) As tolerated: NO showers NO driving No heavy activities Walk bending over at all times  Diet:regular   Wound Care: Keep dressing clean & dry  Do not change dressings Shower in 48 hours For Abdominoplasties wear abdominal binder Special Instructions: Continue to empty, recharge, & record drainage from J-P drains &/or Hemovacs 2-3 times a day, as needed. Call Doctor if any unusual problems occur such as pain, excessive Bleeding, unrelieved Nausea/vomiting, Fever &/or chills When lying down, keep head elevated on 2-3 pillows or back-rest For Addominoplasties the Jack-knife position. When walking needs to bend over at all times. Straighten the core may open the incision sites. Remove umbilical dressing in 72 hours. Leave rest of dressings in place (Steri strips) Follow-up appointment: Please call the office.  The patient received discharge instruction from:___________________________________________   Patient signature ________________________________________ / Date___________    Signature of individual providing instructions/ Date________________

## 2024-08-17 NOTE — Anesthesia Postprocedure Evaluation (Signed)
 Anesthesia Post Note  Patient: MALAISHA SILLIMAN  Procedure(s) Performed: PANNICULECTOMY (Bilateral: Abdomen)     Patient location during evaluation: PACU Anesthesia Type: General Level of consciousness: awake and alert, oriented and patient cooperative Pain management: pain level controlled Vital Signs Assessment: post-procedure vital signs reviewed and stable Respiratory status: spontaneous breathing, nonlabored ventilation and respiratory function stable Cardiovascular status: blood pressure returned to baseline and stable Postop Assessment: no apparent nausea or vomiting Anesthetic complications: no   No notable events documented.  Last Vitals:  Vitals:   08/17/24 1212 08/17/24 1241  BP:  (!) 133/90  Pulse: 70 60  Resp: 12 20  Temp:  37.4 C  SpO2: 97% 96%    Last Pain:  Vitals:   08/17/24 1241  TempSrc: Temporal  PainSc: 3                  Almarie CHRISTELLA Marchi

## 2024-08-17 NOTE — Interval H&P Note (Deleted)
 History and Physical Interval Note:  08/17/2024 6:49 AM  Holly Garner Ellen  has presented today for surgery, with the diagnosis of panniculus.  The various methods of treatment have been discussed with the patient and family. After consideration of risks, benefits and other options for treatment, the patient has consented to  Procedure(s): PANNICULECTOMY (Bilateral) as a surgical intervention.  The patient's history has been reviewed, patient examined, no change in status, stable for surgery.  I have reviewed the patient's chart and labs.  Questions were answered to the patient's satisfaction.    Maayan Jenning M. Geet Hosking, MD [facility]

## 2024-08-17 NOTE — Interval H&P Note (Signed)
 History and Physical Interval Note:  08/17/2024 6:50 AM  Holly Garner  has presented today for surgery, with the diagnosis of panniculus.  The various methods of treatment have been discussed with the patient and family. After consideration of risks, benefits and other options for treatment, the patient has consented to  Procedure(s): PANNICULECTOMY (Bilateral) as a surgical intervention.  The patient's history has been reviewed, patient examined, no change in status, stable for surgery.  I have reviewed the patient's chart and labs.  Questions were answered to the patient's satisfaction.     Lyndee Herbst M Kamylah Manzo

## 2024-08-17 NOTE — Op Note (Addendum)
 Operative Note   DATE OF OPERATION: 08/17/2024  LOCATION: [facility]  SURGICAL DEPARTMENT: Plastic Surgery  PREOPERATIVE DIAGNOSES:  Panniculus   POSTOPERATIVE DIAGNOSES:  same  PROCEDURE:  Panniculectomy  SURGEON: Lloyd Cullinan, MD   ASSISTANT: Donnice Edelson, PA  ANESTHESIA:  General.   COMPLICATIONS: None.  EBL: 250 ml  IV fluids: 1500 ml  INDICATIONS FOR PROCEDURE:  The patient, Holly Garner is a 43 y.o. female born on 10-Mar-1981, is here for treatment of abdominal pannus s/p massive weigh loss.  MRN: [id number]  CONSENT:  The patient was seen for the planned panniculectomy versus abdominoplasty.  Patient opted for panniculectomy.  Explained to the patient that this is a functional operation and not a cosmetic operation, patient expressed understanding.  Informed consent was obtained directly from the patient. Risks, benefits and alternatives were fully discussed. Specific risks including but not limited to bleeding, infection, hematoma, seroma, scarring, pain, infection, contracture, asymmetry, wound healing problems, and need for further surgery were all discussed.  I also explained that sometimes the umbilicus needs to be resected.  The patient did have an ample opportunity to have questions answered to satisfaction.   DESCRIPTION OF PROCEDURE:  The patient was taken to the operating room. SCDs were placed and IV antibiotics were given. The patient's operative site was prepped and draped in a sterile fashion. A time out was performed and all information was confirmed to be correct.  General anesthesia was administered.    We addressed the panniculectomy. The patient had been marked in the standing position. We incised around the umbilicus and the umbilicus was dissected free keeping a healthy amount of adipose tissue around it. We then made the lower incision that was marked to be about 7 cm above the anterior fourchette and tapered gently superiorly as it moved  laterally. The flap was elevated off the fascia leaving small amounts of fatty tissue on the fascia up to the umbilicus and no further laterally than the lateral rectus border once above the umbilicus.  We then advanced the mons pubis skin and tacked it exactly where we wanted it with 0 Vicryl sutures. The patient was then flexed conservatively the abdominal flap was advanced inferiorly and excess skin removed in sections.  The abdomen was washed with Peroxide first and then Vashe. Hemostasis was achieved meticulously with cautery. I performed this twice. Valsalva maneuver done as well times two, hemostasis confirmed. Two 38F French Blake drains were brought out laterally. The incision was then closed with 0 Vicryl in a triangulation fashion at the Scarpa's level. The deep dermis was approximated with 3-0 PDS and the skin was closed with a 4-0 Monocryl.  Incision was made in the abdominal skin and subcutaneous tissue and the umbilicus was exteriorized. It was sutured with 3-0 Monocryl and 5-0 fast. At the end of the procedure the umbilicus looked mildly congested, the decision was made to open a few sutures, plenty of space for the umbilicus was noticed and position was checked and umbilicus was straight. Nitropaste mixed  with bacitracin  applied to umbilicus. Sterile dressings were applied.  The patient tolerated the procedure well.  There were no complications. The patient was allowed to wake from anesthesia, extubated and taken to the recovery room in satisfactory condition.   The advanced practice practitioner (APP) assisted throughout the case.  The APP was essential in retraction and counter traction when needed to make the case progress smoothly.  This retraction and assistance made it possible to see the tissue plans for  the procedure.  The assistance was needed for blood control, tissue re-approximation and assisted with closure of the incision site.   Steadman Prosperi M. Tishina Lown, MD [facility]

## 2024-08-17 NOTE — Transfer of Care (Signed)
 Immediate Anesthesia Transfer of Care Note  Patient: Holly Garner  Procedure(s) Performed: Procedure(s) (LRB): PANNICULECTOMY (Bilateral)  Patient Location: PACU  Anesthesia Type: General  Level of Consciousness: awake, oriented, sedated and patient cooperative  Airway & Oxygen Therapy: Patient Spontanous Breathing Emery Oxygen  Post-op Assessment: Report given to PACU RN and Post -op Vital signs reviewed and stable  Post vital signs: Reviewed and stable  Complications: No apparent anesthesia complications Last Vitals:  Vitals Value Taken Time  BP 140/94 08/17/24 11:33  Temp 36.5 C 08/17/24 11:33  Pulse 90 08/17/24 11:35  Resp 15 08/17/24 11:35  SpO2 97 % 08/17/24 11:35  Vitals shown include unfiled device data.  Last Pain:  Vitals:   08/17/24 0628  TempSrc: Temporal  PainSc: 0-No pain         Complications: No notable events documented.

## 2024-08-18 ENCOUNTER — Encounter (HOSPITAL_BASED_OUTPATIENT_CLINIC_OR_DEPARTMENT_OTHER): Payer: Self-pay

## 2024-08-19 ENCOUNTER — Ambulatory Visit (INDEPENDENT_AMBULATORY_CARE_PROVIDER_SITE_OTHER)

## 2024-08-19 VITALS — BP 114/68 | HR 74 | Temp 98.0°F | Ht 65.0 in | Wt 178.0 lb

## 2024-08-19 DIAGNOSIS — Z9889 Other specified postprocedural states: Secondary | ICD-10-CM

## 2024-08-19 MED ORDER — NITROGLYCERIN 2 % TD OINT: 0.5000 [in_us] | TOPICAL_OINTMENT | Freq: Every day | TRANSDERMAL | Status: DC

## 2024-08-19 NOTE — Progress Notes (Addendum)
 Patient is a 43 year old female status post panniculectomy with umbilical transposition last Monday.  Patient tolerated the procedure well.  There were no complications.  Patient is pleased with the result.  Pain has been under control.  Incisions with Steri-Strips in place.  No signs of infection.  JP drains serosanguineous 40 cc from the right and 100 cc from the left in the last 24 hours.  The neo-umbilicus looks congested but viable.      After discussion with the patient and family we decided to continue Nitropaste 0.5 inch after showering on the umbilicus and to cover the umbilicus with a Band-Aid.  Patient will follow-up with me next week for pictures and to reassess umbilicus.  Patient also instructed to avoid secondhand smoking at home.  Patient has been had a chance to ask questions, all questions were answered to patient and husband satisfaction.  Khambrel Amsden M. Merridy Pascoe, MD [facility]

## 2024-08-20 ENCOUNTER — Other Ambulatory Visit: Payer: Self-pay | Admitting: Physician Assistant

## 2024-08-20 ENCOUNTER — Telehealth: Payer: Self-pay

## 2024-08-20 DIAGNOSIS — Z9889 Other specified postprocedural states: Secondary | ICD-10-CM

## 2024-08-20 MED ORDER — NITROGLYCERIN 2 % TD OINT: 0.5000 [in_us] | TOPICAL_OINTMENT | Freq: Two times a day (BID) | TRANSDERMAL | 0 refills | Status: DC

## 2024-08-20 NOTE — Addendum Note (Signed)
 Addended by: EUSTACIO POUR on: 08/20/2024 09:37 AM   Modules accepted: Orders

## 2024-08-20 NOTE — Telephone Encounter (Signed)
 done

## 2024-08-20 NOTE — Telephone Encounter (Signed)
 Dr. CHRISTELLA sent a prescription in yesterday and I can't seem to find out what pharmacy, can someone please reach out to patient

## 2024-08-20 NOTE — Progress Notes (Signed)
 Prescription was unavailable at her usual pharmacy and asked that we send to a new one.

## 2024-08-20 NOTE — Telephone Encounter (Signed)
 Please send to [facility] - [address]                                                      [place], [place]

## 2024-08-21 ENCOUNTER — Encounter: Payer: Self-pay | Admitting: Emergency Medicine

## 2024-08-21 ENCOUNTER — Other Ambulatory Visit: Payer: Self-pay

## 2024-08-21 DIAGNOSIS — I2510 Atherosclerotic heart disease of native coronary artery without angina pectoris: Secondary | ICD-10-CM | POA: Insufficient documentation

## 2024-08-21 DIAGNOSIS — R55 Syncope and collapse: Secondary | ICD-10-CM | POA: Diagnosis not present

## 2024-08-21 DIAGNOSIS — E119 Type 2 diabetes mellitus without complications: Secondary | ICD-10-CM | POA: Insufficient documentation

## 2024-08-21 DIAGNOSIS — R42 Dizziness and giddiness: Secondary | ICD-10-CM | POA: Insufficient documentation

## 2024-08-21 DIAGNOSIS — I10 Essential (primary) hypertension: Secondary | ICD-10-CM | POA: Insufficient documentation

## 2024-08-21 LAB — COMPREHENSIVE METABOLIC PANEL WITH GFR
ALT: 18 U/L (ref 0–44)
AST: 23 U/L (ref 15–41)
Albumin: 3.3 g/dL — ABNORMAL LOW (ref 3.5–5.0)
Alkaline Phosphatase: 101 U/L (ref 38–126)
Anion gap: 13 (ref 5–15)
BUN: 14 mg/dL (ref 6–20)
CO2: 24 mmol/L (ref 22–32)
Calcium: 8.9 mg/dL (ref 8.9–10.3)
Chloride: 105 mmol/L (ref 98–111)
Creatinine, Ser: 0.52 mg/dL (ref 0.44–1.00)
GFR, Estimated: >60 mL/min (ref >60)
Glucose, Bld: 111 mg/dL — ABNORMAL HIGH (ref 70–99)
Potassium: 3.9 mmol/L (ref 3.5–5.1)
Sodium: 142 mmol/L (ref 135–145)
Total Bilirubin: 0.4 mg/dL (ref 0.0–1.2)
Total Protein: 7.1 g/dL (ref 6.5–8.1)

## 2024-08-21 LAB — CBC
HCT: 31.7 % — ABNORMAL LOW (ref 36.0–46.0)
Hemoglobin: 9.7 g/dL — ABNORMAL LOW (ref 12.0–15.0)
MCH: 26.6 pg (ref 26.0–34.0)
MCHC: 30.6 g/dL (ref 30.0–36.0)
MCV: 87.1 fL (ref 80.0–100.0)
Platelets: 318 K/uL (ref 150–400)
RBC: 3.64 MIL/uL — ABNORMAL LOW (ref 3.87–5.11)
RDW: 12.8 % (ref 11.5–15.5)
WBC: 8.1 K/uL (ref 4.0–10.5)
nRBC: 0 % (ref 0.0–0.2)

## 2024-08-21 NOTE — ED Triage Notes (Signed)
 Pt arrives POV from via [facility] c/o syncopal episode tonight after showering tonight, was assisted to floor by husband, denies hitting head. Pt had panniculectomy on Monday and has been doing well. Pt has not been taking pain medications. Pt feels very fatigued. Denies cp, sob, or dizziness. Surgical pain controlled as well. No acute distress noted.

## 2024-08-21 NOTE — ED Triage Notes (Signed)
 Pt has abd binder on from recent surgery and 2 drainage tube coming from surgical site. Binder adjusted to perform EKG and husband reports drainage in bulb is darker than it has been. Red blood noted in bulb, pt's husband reports it has been pink-light red.

## 2024-08-22 ENCOUNTER — Emergency Department
Admission: EM | Admit: 2024-08-22 | Discharge: 2024-08-22 | Disposition: A | Discharge status: Home / Self Care | Payer: Self-pay | Attending: Emergency Medicine | Admitting: Emergency Medicine

## 2024-08-22 DIAGNOSIS — R55 Syncope and collapse: Secondary | ICD-10-CM

## 2024-08-22 LAB — URINALYSIS, ROUTINE W REFLEX MICROSCOPIC
Bilirubin Urine: NEGATIVE
Glucose, UA: NEGATIVE mg/dL
Hgb urine dipstick: NEGATIVE
Ketones, ur: NEGATIVE mg/dL
Leukocytes,Ua: NEGATIVE
Nitrite: NEGATIVE
Protein, ur: NEGATIVE mg/dL
Specific Gravity, Urine: 1.026 (ref 1.005–1.030)
pH: 5 (ref 5.0–8.0)

## 2024-08-22 LAB — POC URINE PREG, ED: Preg Test, Ur: NEGATIVE

## 2024-08-22 LAB — TROPONIN I (HIGH SENSITIVITY): Troponin I (High Sensitivity): 3 ng/L

## 2024-08-22 NOTE — ED Provider Notes (Signed)
 [facility] Provider Note    Event Date/Time   First MD Initiated Contact with Patient 08/22/24 0020     (approximate)   History   Loss of Consciousness   HPI  Holly Garner is a 43 y.o. female with history of CAD, hypertension, hyperlipidemia, type 2 diabetes who presents with a syncopal episode.  The patient states that she was in the shower when she suddenly started to get lightheaded and weak, and then passed out.  She did not hit her head.  She states that she is feeling back to baseline now.  She had a panniculectomy earlier this week and has been doing well.  She has not taken any pain medication in 2 days, however she is on a nitroglycerin  paste for swelling around her umbilicus that she has been using twice daily.  She reports that the drainage from her drain tubes has gradually become darker in color, however the volume has decreased steadily over the last several days.  She has no new or worsening pain.  She denies any vomiting or diarrhea.  She has no chest pain or difficulty breathing.  I reviewed the past medical records.  The patient had a panniculectomy on 10/27 by Dr. Montorfano at [facility] which was uncomplicated.   Physical Exam   Triage Vital Signs: ED Triage Vitals  Encounter Vitals Group     BP 08/21/24 2112 112/69     Girls Systolic BP Percentile --      Girls Diastolic BP Percentile --      Boys Systolic BP Percentile --      Boys Diastolic BP Percentile --      Pulse Rate 08/21/24 2112 98     Resp 08/21/24 2112 14     Temp 08/21/24 2112 97.9 F (36.6 C)     Temp Source 08/21/24 2112 Oral     SpO2 08/21/24 2112 100 %     Weight --      Height --      Head Circumference --      Peak Flow --      Pain Score 08/21/24 2116 0     Pain Loc --      Pain Education --      Exclude from Growth Chart --     Most recent vital signs: Vitals:   08/21/24 2112 08/22/24 0030  BP: 112/69 (!) 102/55  Pulse: 98 77  Resp: 14 16   Temp: 97.9 F (36.6 C) 98.1 F (36.7 C)  SpO2: 100% 100%    General: Alert, well-appearing, no distress.  CV:  Good peripheral perfusion.  Resp:  Normal effort.  Abd:  No distention.  Other:  Abdominal binder in place.  Moist mucous membranes.   ED Results / Procedures / Treatments   Labs (all labs ordered are listed, but only abnormal results are displayed) Labs Reviewed  COMPREHENSIVE METABOLIC PANEL WITH GFR - Abnormal; Notable for the following components:      Result Value   Glucose, Bld 111 (*)    Albumin 3.3 (*)    All other components within normal limits  CBC - Abnormal; Notable for the following components:   RBC 3.64 (*)    Hemoglobin 9.7 (*)    HCT 31.7 (*)    All other components within normal limits  URINALYSIS, ROUTINE W REFLEX MICROSCOPIC - Abnormal; Notable for the following components:   Color, Urine YELLOW (*)    APPearance HAZY (*)  All other components within normal limits  POC URINE PREG, ED  TROPONIN I (HIGH SENSITIVITY)     EKG  ED ECG REPORT I, Waylon Cassis, the attending physician, personally viewed and interpreted this ECG.  Date: 08/21/2024 EKG Time: 2117 Rate: 99 Rhythm: normal sinus rhythm QRS Axis: normal Intervals: normal ST/T Wave abnormalities: Nonspecific ST abnormalities Narrative Interpretation: no evidence of acute ischemia    RADIOLOGY   PROCEDURES:  Critical Care performed: No  Procedures   MEDICATIONS ORDERED IN ED: Medications - No data to display   IMPRESSION / MDM / ASSESSMENT AND PLAN / ED COURSE  I reviewed the triage vital signs and the nursing notes.  43 year old female with PMH as noted above, status post panniculectomy several days ago, presents with a syncopal episode in the shower with a vasovagal prodrome.  She is now asymptomatic.  On exam her vital signs are normal.  Physical exam is unremarkable for acute findings.  Differential diagnosis includes, but is not limited to, vasovagal  episode, dehydration/hypovolemia, anemia, blood loss, less likely cardiac etiology.  Patient's presentation is most consistent with acute presentation with potential threat to life or bodily function.  The patient is on the cardiac monitor to evaluate for evidence of arrhythmia and/or significant heart rate changes.   Overall I suspect a vasovagal episode especially given that this happened in the shower and the patient is now back to baseline.  CBC shows slight anemia when compared to labs from several months ago.  This is normal and to be expected postsurgery.  I suggested checking a repeat 4-hour hemoglobin, however the patient does not want to wait for this.  She feels comfortable and wants to go home.  CMP is unremarkable.  EKG showed nonspecific T wave abnormality so I recommended that we at least check a single troponin, and the patient is agreeable.  There is no evidence of an acute postsurgical complication.  The amount of drainage has been steadily decreasing, and the patient not requiring any pain medication.  I suspect that the Nitropaste could have contributed to her syncopal episode, although she last used it earlier in the afternoon.  ----------------------------------------- 1:45 AM on 08/22/2024 -----------------------------------------  Troponin is negative.  The patient remains asymptomatic and is ready to go home.  She is stable for discharge at this time.  Return precautions provided, and she expresses understanding   FINAL CLINICAL IMPRESSION(S) / ED DIAGNOSES   Final diagnoses:  Vasovagal syncope     Rx / DC Orders   ED Discharge Orders     None        Note:  This document was prepared using Dragon voice recognition software and may include unintentional dictation errors.    Cassis Waylon, MD 08/22/24 [phone]

## 2024-08-22 NOTE — Discharge Instructions (Signed)
 Call your surgeon on Monday for follow-up.  Make sure to drink plenty of fluids and eat small amounts regularly throughout the day.  Return to the ER immediately for new, worsening, or persistent severe dizziness, lightheadedness, recurrent episodes of passing out, increased bleeding or drainage from the drains, fever, weakness, or any other new or worsening symptoms that concern you.

## 2024-08-24 ENCOUNTER — Ambulatory Visit: Admitting: Cardiovascular Disease

## 2024-08-24 ENCOUNTER — Ambulatory Visit: Admitting: Student

## 2024-08-26 ENCOUNTER — Ambulatory Visit (INDEPENDENT_AMBULATORY_CARE_PROVIDER_SITE_OTHER)

## 2024-08-26 VITALS — BP 126/70 | HR 70

## 2024-08-26 DIAGNOSIS — Z9889 Other specified postprocedural states: Secondary | ICD-10-CM

## 2024-08-26 DIAGNOSIS — Z09 Encounter for follow-up examination after completed treatment for conditions other than malignant neoplasm: Secondary | ICD-10-CM

## 2024-08-26 NOTE — Progress Notes (Signed)
   Established Patient Office Visit  Subjective   Patient ID: Holly Garner, female    DOB: Feb 18, 1981  Age: 43 y.o. MRN: [id number]  Chief Complaint  Patient presents with   Post-op Follow-up    HPI  Patient is a 43 year old female status post panniculectomy with umbilical transposition 2 weeks ago.  Patient tolerated the procedure well.  There were no complications.  Patient is pleased with the result.  Pain has been under control. Had a syncopal episode in the shower. No LOC. ER workup was negative. Otherwise no issues.    Objective:     BP 126/70 (BP Location: Left Arm, Patient Position: Sitting, Cuff Size: Large)   Pulse 70   SpO2 100%  BP Readings from Last 3 Encounters:  08/26/24 126/70  08/22/24 (!) 102/55  08/19/24 114/68      Physical Exam MA as chaperone Incisions with Steri-Strips in place.  No signs of infection.  JP drains serosanguineous 20 cc from the right and 20 cc from the left in the last 24 hours.  The neo-umbilicus looks less congested and viable.    No results found for any visits on 08/26/24.  Last CBC Lab Results  Component Value Date   WBC 8.1 08/21/2024   HGB 9.7 (L) 08/21/2024   HCT 31.7 (L) 08/21/2024   MCV 87.1 08/21/2024   MCH 26.6 08/21/2024   RDW 12.8 08/21/2024   PLT 318 08/21/2024   Last metabolic panel Lab Results  Component Value Date   GLUCOSE 111 (H) 08/21/2024   NA 142 08/21/2024   K 3.9 08/21/2024   CL 105 08/21/2024   CO2 24 08/21/2024   BUN 14 08/21/2024   CREATININE 0.52 08/21/2024   GFRNONAA >60 08/21/2024   CALCIUM  8.9 08/21/2024   PROT 7.1 08/21/2024   ALBUMIN 3.3 (L) 08/21/2024   BILITOT 0.4 08/21/2024   ALKPHOS 101 08/21/2024   AST 23 08/21/2024   ALT 18 08/21/2024   ANIONGAP 13 08/21/2024      The ASCVD Risk score (Arnett DK, et al., 2019) failed to calculate for the following reasons:   Risk score cannot be calculated because patient has a medical history suggesting prior/existing ASCVD     Assessment & Plan:   Problem List Items Addressed This Visit   None Visit Diagnoses       Follow-up exam    -  Primary      Patient is a 43 year old female status post panniculectomy with umbilical transposition.   After discussion with the patient and family we decided to continue Nitropaste 0.25 inch after showering on the umbilicus and to cover the umbilicus with a Band-Aid.  Patient will follow-up with PA next week for pictures and to reassess umbilicus, if it looks viable stop nitorpaste.  Patient also instructed to avoid secondhand smoking at home. Follow up with me in 3 months.   Patient has been had a chance to ask questions, all questions were answered to patient and husband satisfaction.  Phuong Moffatt M. Ikenna Ohms, MD [facility]

## 2024-08-31 ENCOUNTER — Ambulatory Visit (INDEPENDENT_AMBULATORY_CARE_PROVIDER_SITE_OTHER): Admitting: Student

## 2024-08-31 DIAGNOSIS — Z9889 Other specified postprocedural states: Secondary | ICD-10-CM

## 2024-08-31 NOTE — Progress Notes (Signed)
 Patient is a 43 year old female who recently underwent panniculectomy with Dr. Montorfano on 08/17/2024.  Patient is 2 weeks postop.  She presents to the clinic today for possible drain removal.  Patient was last seen in the clinic on 08/26/2024.  At this visit, patient had reported she had tolerated the procedure well.  She was pleased with the result.  It was noted that she had a syncopal episode in the shower and ER workup was negative.  On exam, incisions were noted to have Steri-Strips in place.  There were no signs of infection.  JP drains had serosanguineous drainage 20 cc from the right and 20 cc from the left in the last 24 hours.  The umbilicus looked less congested and viable.  It was recommended that patient continue with Nitropaste after showering on the umbilicus and cover the umbilicus with a Band-Aid.  Plan was for her to follow-up and if the umbilicus looked viable to stop the Nitropaste.  Today, patient reports she is doing really well.  She denies any new complaints or concerns.  She denies any fevers or chills.  Patient states that her left drain has only put out about 10 to 15 cc throughout the entire weekend.  Patient also reports that she stopped using the Nitropaste on her umbilicus on Saturday.  Chaperone present on exam.  On exam, patient sitting upright in no acute distress.  Abdomen is soft and nontender.  There is no overlying erythema, no obvious fluid collections palpated on exam.  Umbilicus appears to be healthy.  Incision around the umbilicus appears to be intact and healing well.  There were a few Steri-Strips noted to the inferior abdominal incision that appeared to be falling off.  These were removed.  Incision is overall intact and healing well.   left JP drain was in place and functioning with minimal dark serosanguineous drainage in the bulb.  There were no signs of infection on exam.  Left JP drain was removed without any difficulty.  Patient tolerated well.  I  discussed with the patient the importance of wearing compression at all times and avoiding strenuous activities.  I discussed with her that her left drain site may drain over the next few days.  Discussed with her to allow it to drain, she may place gauze and tape over the area.  Discussed with her that starting in a few days, she may apply Vaseline to the drain site.  She expressed understanding.  Discussed with the patient that she no longer needs to use the Nitropaste.  Discussed with her she may apply Vaseline to her incision and around the umbilicus once a day.  Patient expressed understanding.  Patient to follow back up in 1 to 2 weeks.  I instructed her to call if she has any questions or concerns about anything.

## 2024-09-02 ENCOUNTER — Ambulatory Visit (INDEPENDENT_AMBULATORY_CARE_PROVIDER_SITE_OTHER): Admitting: Student

## 2024-09-02 VITALS — BP 138/85 | HR 74 | Temp 97.5°F

## 2024-09-02 DIAGNOSIS — Z9889 Other specified postprocedural states: Secondary | ICD-10-CM

## 2024-09-02 MED ORDER — SULFAMETHOXAZOLE-TRIMETHOPRIM 800-160 MG PO TABS: 1.0000 | ORAL_TABLET | Freq: Two times a day (BID) | ORAL | 0 refills | Status: AC

## 2024-09-02 MED ORDER — GABAPENTIN 300 MG PO CAPS: 300.0000 mg | ORAL_CAPSULE | Freq: Two times a day (BID) | ORAL | 0 refills | Status: DC

## 2024-09-02 MED ORDER — CYCLOBENZAPRINE HCL 5 MG PO TABS: 5.0000 mg | ORAL_TABLET | Freq: Three times a day (TID) | ORAL | 0 refills | Status: AC | PRN

## 2024-09-02 MED ORDER — OXYCODONE HCL 5 MG PO TABS: 5.0000 mg | ORAL_TABLET | Freq: Four times a day (QID) | ORAL | 0 refills | Status: DC | PRN

## 2024-09-02 NOTE — Progress Notes (Cosign Needed Addendum)
 Patient is a 43 year old female who recently underwent panniculectomy with Dr. Montorfano on 08/17/2024.  Patient is a little over 2 weeks postop.  She presents to the clinic today with pain to her incision.  Patient was last seen in the clinic on 08/31/2024.  At this visit, patient was overall doing well.  On exam, umbilicus was healthy, abdomen was soft and nontender.  Incision was overall intact and healing well.  Left JP drain was removed.  Today, patient presents with her friend at bedside.  She states that starting at about 1230 today, she started noticing pain to the central aspect of her incision and to her superior mons.  She reports the pain is burning.  She states that it worsened throughout the day.  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  She states that she did not lift anything heavy.  She does not report any traumas.  She denies any fevers or chills.  She denies any nausea or vomiting.  She reports that she has been having bowel movements and urinating without any difficulty.  She states that otherwise she has been feeling okay.  She denies any chest pain or shortness of breath.  Vitals:   09/02/24 1559  BP: 138/85  Pulse: 74  Temp: (!) 97.5 F (36.4 C)  SpO2: 100%   Patient is afebrile, vitals are stable.   Chaperone present on exam.  On exam, patient is sitting upright and appears to be in pain.  Abdomen is overall soft.  There is no overlying erythema.  There is some tenderness to palpation noted to the central incision and just inferior to the incision to the skin.  Remainder of the abdomen is nontender.  There is no obvious fluid collection on exam.  There is a small pinpoint wound to the central incision.  There is no active drainage and I was not able to express any drainage upon palpation. There was mild induration around this area. Remainder of the incision is intact and healing well.  Umbilicus appears to be healthy and the incision around the  umbilicus appears to be healing well.  Bilateral lower extremities are symmetric and nontender.  There is no overlying erythema to either lower extremity.  Dr. Montorfano had the opportunity to examine the patient today.  Recommended that patient start antibiotics given pain and mild induration around the wound along with multimodal pain medications.  Will send in Bactrim  for 10 days as well as oxycodone , gabapentin and Flexeril.  I discussed with the patient to not take the oxycodone , gabapentin and Flexeril at the same time as these can be sedating.  Patient expressed understanding.  Also encouraged the patient to take Tylenol  for her pain.  Patient expressed understanding.  Discussed with the patient to continue to wear her compression.  Will have the patient apply Xeroform and gauze over the small pinpoint wound daily.  Patient expressed understanding.  Encouraged the patient to drink plenty of fluids, recommended adding on Gatorade or liquid IV.  Patient expressed understanding.  Discussed with the patient to closely monitor her surgical site.  Discussed with her that if she develops any fevers, chills, worsening pain, redness she needs to go to the emergency room.  She expressed understanding.  Discussed with the patient that she may call us  with any questions or concerns  Patient to follow back up on Friday  Pictures were obtained of the patient and placed in the chart with the patient's or guardian's  permission.

## 2024-09-04 ENCOUNTER — Ambulatory Visit

## 2024-09-04 VITALS — BP 129/76 | HR 68

## 2024-09-04 DIAGNOSIS — Z09 Encounter for follow-up examination after completed treatment for conditions other than malignant neoplasm: Secondary | ICD-10-CM

## 2024-09-04 DIAGNOSIS — Z9889 Other specified postprocedural states: Secondary | ICD-10-CM

## 2024-09-04 NOTE — Progress Notes (Signed)
   Established Patient Office Visit  Subjective   Patient ID: Holly Garner, female    DOB: July 27, 1981  Age: 43 y.o. MRN: [id number]  Chief Complaint  Patient presents with   post op    HPI 43 year old female who recently underwent panniculectomy with Dr. Brylon Brenning on 08/17/2024.  Had an episode of incisional pain earlier this week a few days after JP removal.  Patient went back to work and as she was working she felt the right lower abdominal incisional pain that increased over the course of the day.  Patient decided to keep working until the pain became unbearable, and she presented to the clinic for evaluation.  I assessed her with a PA at the time and since she had pain and induration and a small wound breakdown with serous fluid prescribed a short course of antibiotics and refilled her pain medication.  Today patient is doing much better.  Pain has been under control.  She has not had any other episodes of excruciating burning pain at incision site.    Objective:     BP 129/76 (BP Location: Left Arm, Patient Position: Sitting, Cuff Size: Normal)   Pulse 68   SpO2 100%  BP Readings from Last 3 Encounters:  09/04/24 129/76  09/02/24 138/85  08/26/24 126/70      Physical Exam On exam, umbilicus well perfused, abdomen soft and nontender. Incision overall intact and healing well.  2 small wound dehiscence sites less than 0.3 cm each, probed, less than 0.2 cm deep.  No results found for any visits on 09/04/24.  Last CBC Lab Results  Component Value Date   WBC 8.1 08/21/2024   HGB 9.7 (L) 08/21/2024   HCT 31.7 (L) 08/21/2024   MCV 87.1 08/21/2024   MCH 26.6 08/21/2024   RDW 12.8 08/21/2024   PLT 318 08/21/2024   Last metabolic panel Lab Results  Component Value Date   GLUCOSE 111 (H) 08/21/2024   NA 142 08/21/2024   K 3.9 08/21/2024   CL 105 08/21/2024   CO2 24 08/21/2024   BUN 14 08/21/2024   CREATININE 0.52 08/21/2024   GFRNONAA >60 08/21/2024   CALCIUM  8.9  08/21/2024   PROT 7.1 08/21/2024   ALBUMIN 3.3 (L) 08/21/2024   BILITOT 0.4 08/21/2024   ALKPHOS 101 08/21/2024   AST 23 08/21/2024   ALT 18 08/21/2024   ANIONGAP 13 08/21/2024      The ASCVD Risk score (Arnett DK, et al., 2019) failed to calculate for the following reasons:   Risk score cannot be calculated because patient has a medical history suggesting prior/existing ASCVD    Assessment & Plan:   Problem List Items Addressed This Visit   None Visit Diagnoses       Follow-up exam    -  Primary     Status post panniculectomy          Finish course of antibiotics. Continue Xeroform and Band-Aid on small dehiscence sites. Continue to take Tylenol  and gabapentin around-the-clock.  Narcotics and muscle relaxants as needed. Instructed to call with any questions or concerns. Follow-up with PA next week and with me in February granted there are no other concerns.  Holly Garner M. Serapio Edelson, MD [facility]

## 2024-09-07 MED ORDER — GABAPENTIN 100 MG PO CAPS: 100.0000 mg | ORAL_CAPSULE | Freq: Two times a day (BID) | ORAL | 0 refills | Status: DC

## 2024-09-07 NOTE — Addendum Note (Signed)
 Addended by: EUSTACIO POUR on: 09/07/2024 11:39 AM   Modules accepted: Orders

## 2024-09-08 ENCOUNTER — Encounter: Admitting: Surgical

## 2024-09-09 ENCOUNTER — Encounter: Admitting: Surgical

## 2024-09-09 ENCOUNTER — Ambulatory Visit: Admitting: Student

## 2024-09-09 DIAGNOSIS — Z9889 Other specified postprocedural states: Secondary | ICD-10-CM

## 2024-09-09 NOTE — Progress Notes (Signed)
 Patient is a 43 year old female who recently underwent panniculectomy with Dr. Montorfano on 08/17/2024.  Patient is a little over 3 weeks postop.  She presents to the clinic today for postoperative follow-up.  Patient was last seen in the clinic on 09/04/2024.  At this visit, patient was doing much better.  Her pain had been under control.  There were not any further episodes of excruciating pain at the incision site.  On exam, the umbilicus was well-perfused and the abdomen was soft and nontender.  Incision was intact and healing well.  There were 2 small wound dehiscence sites, less than 0.3 cm tears each and less than 0.2 cm deep.  It was recommended that patient finish the course of the antibiotics and continue with Xeroform and Band-Aid on the small dehiscence sites.  Today, patient reports she is overall doing well.  She states that she is still having some moments of pain, especially near the central incision.  She.  She states that it is not all of the time though.  She denies any fevers or chills.  She denies any other issues or concerns at this time.  She states that she has been doing the dressing changes to her incision daily.  Chaperone present on exam.  On exam, patient is sitting upright in no acute distress.  Abdomen is overall soft and nontender.  There is no overlying erythema, no obvious fluid collections palpated on exam.  Umbilicus appears to be healthy, incision around the umbilicus is well-healed.  Overall the inferior abdominal incision is healing healing well, there are a few superficial scattered wounds.  There is no active drainage on exam.  No signs of infection.  Recommend that patient apply Vaseline throughout her incision daily and cover wounds with this with gauze or nonstick gauze.  Discussed with her to continue with compression at all times and avoid strenuous activities.  She expressed understanding.  Discussed with the patient to take Tylenol  for her pain, and finish  the gabapentin and only take the oxycodone  if absolutely necessary.  Patient expressed understanding.  Patient to follow back up in a few weeks for reevaluation.  I instructed her to call in the meantime if she has any questions or concerns about anything.

## 2024-09-15 ENCOUNTER — Encounter: Admitting: Plastic Surgery

## 2024-09-28 ENCOUNTER — Encounter: Admitting: Student

## 2024-09-28 NOTE — Progress Notes (Deleted)
 Patient is a 43 year old female who recently underwent panniculectomy with Dr. Montorfano on 08/17/2024.  Patient is 6 weeks postop.  She presents to the clinic today for postoperative follow-up.  Patient was last seen in the clinic on 09/09/2024.  At this visit, patient was overall doing well.  On exam, abdomen was overall soft and nontender.  There is no overlying erythema, no obvious fluid collections on exam.  Umbilicus was healthy incision around the umbilicus was well-healed.  Inferior abdominal incision was healing well.  There were a few superficial scattered wounds.  Recommended that patient apply Vaseline throughout her incision daily and cover her wounds with gauze or nonstick gauze.  Today,

## 2024-11-11 ENCOUNTER — Encounter

## 2024-11-17 ENCOUNTER — Ambulatory Visit: Payer: Self-pay

## 2024-11-17 VITALS — BP 147/91 | HR 71

## 2024-11-17 DIAGNOSIS — Z09 Encounter for follow-up examination after completed treatment for conditions other than malignant neoplasm: Secondary | ICD-10-CM

## 2024-11-17 DIAGNOSIS — Z9889 Other specified postprocedural states: Secondary | ICD-10-CM

## 2024-11-17 NOTE — Progress Notes (Signed)
" ° °  Established Patient Office Visit  Subjective   Patient ID: Holly Garner, female    DOB: 1981/01/10  Age: 44 y.o. MRN: [id number]  Chief Complaint  Patient presents with   post op    HPI 44 year old female who recently underwent panniculectomy with Dr. Chardai Gangemi on 08/17/2024.  Patient is 3 months postop.  She presents to the clinic today for postoperative follow-up.  Patient has developed a small wound on the right lateral aspect of the panniculectomy incision.  Has been doing local wound care without success.  Otherwise doing well.    Objective:     BP (!) 147/91 (BP Location: Left Arm, Patient Position: Sitting, Cuff Size: Normal)   Pulse 71   SpO2 100%  BP Readings from Last 3 Encounters:  11/17/24 (!) 147/91  09/04/24 129/76  09/02/24 138/85    Physical Exam RN as chaperone Alert oriented x 3 Abdominal incision clean dry intact except for an area that measures 3 cm x 1 cm with granulation tissue at the base.  No purulent discharge or sign of infection.  No seromas.  Umbilicus healthy and viable.   Assessment & Plan:   Problem List Items Addressed This Visit   None Visit Diagnoses       Status post panniculectomy    -  Primary     Follow-up exam          Patient has been doing well overall.  Wearing compression garments. Shared with me that she has no insurance currently.  Has a small wound dehiscence measuring 3 cm x 1 cm with granulation tissue that was treated with silver nitrate today.  We are going to start Urgo clean Ag every 3 days.  Patient instructed to clean the wound with Vashe and let it soak for 5 to 10 minutes, pat dry the wound and apply another Urgo clean AG dressing.  Patient will follow-up with me in 1 week for wound check.    Kiptyn Rafuse M Andreu Drudge, MD  "

## 2024-11-19 ENCOUNTER — Telehealth: Payer: Self-pay

## 2024-11-19 NOTE — Telephone Encounter (Signed)
 Pt states that she woke up this morning and there is increased bleeding at the incision site, she states that she changed the dressing but is concerned that the incision has opened up again.  Please advise

## 2024-11-21 ENCOUNTER — Other Ambulatory Visit: Payer: Self-pay

## 2024-11-21 MED ORDER — GABAPENTIN 100 MG PO CAPS: 100.0000 mg | ORAL_CAPSULE | Freq: Two times a day (BID) | ORAL | 0 refills | Status: DC

## 2024-11-25 ENCOUNTER — Ambulatory Visit: Payer: Self-pay

## 2024-11-25 VITALS — BP 113/70 | HR 61

## 2024-11-25 DIAGNOSIS — Z09 Encounter for follow-up examination after completed treatment for conditions other than malignant neoplasm: Secondary | ICD-10-CM

## 2024-11-25 DIAGNOSIS — Z9889 Other specified postprocedural states: Secondary | ICD-10-CM

## 2024-11-25 NOTE — Progress Notes (Signed)
" ° °  Established Patient Office Visit  Subjective   Patient ID: Holly Garner, female    DOB: 06/04/1981  Age: 44 y.o. MRN: [id number]  Chief Complaint  Patient presents with   Follow-up    HPI  44 year old female who recently underwent panniculectomy on 08/17/2024.  Patient is 3 months postop.  She presents to the clinic today for postoperative follow-up.  Patient has developed a small wound on the right lateral aspect of the panniculectomy incision improving. Has been doing local wound care with success since the last visit. Otherwise doing well. Husband smokes at home.     Objective:     BP 113/70 (BP Location: Right Arm, Cuff Size: Normal)   Pulse 61   SpO2 100%  BP Readings from Last 3 Encounters:  11/25/24 113/70  11/17/24 (!) 147/91  09/04/24 129/76    Physical Exam RN as chaperone Alert oriented x 3 Abdominal incision clean dry intact except for an area that measures 3 cm x 1 cm with granulation tissue at the base.  No purulent discharge or sign of infection.  No seromas.  Umbilicus healthy and viable.  The ASCVD Risk score (Arnett DK, et al., 2019) failed to calculate for the following reasons:   Risk score cannot be calculated because patient has a medical history suggesting prior/existing ASCVD   * - Cholesterol units were assumed    Assessment & Plan:   Problem List Items Addressed This Visit   None Visit Diagnoses       Status post panniculectomy    -  Primary     Follow-up exam          We are going to continue Urgo clean Ag every 3 days.  Patient instructed to clean the wound with Vashe and let it soak for 5 to 10 minutes, pat dry the wound and apply another Urgo clean AG dressing.  Patient will follow-up with me in 1 month for wound check.     Brittnei Jagiello M Niklas Chretien, MD  "

## 2024-12-22 ENCOUNTER — Ambulatory Visit: Payer: Self-pay | Admitting: Cardiovascular Disease

## 2024-12-25 ENCOUNTER — Ambulatory Visit: Payer: Self-pay
